# Patient Record
Sex: Male | Born: 1970 | Race: White | Hispanic: No | Marital: Single | Smoking: Current every day smoker
Health system: Southern US, Community
[De-identification: ages and names within clinical notes are randomized; demographics above are authoritative.]

## PROBLEM LIST (undated history)

## (undated) DIAGNOSIS — K7460 Unspecified cirrhosis of liver: Secondary | ICD-10-CM

## (undated) DIAGNOSIS — R188 Other ascites: Secondary | ICD-10-CM

## (undated) DIAGNOSIS — K219 Gastro-esophageal reflux disease without esophagitis: Secondary | ICD-10-CM

## (undated) DIAGNOSIS — D696 Thrombocytopenia, unspecified: Secondary | ICD-10-CM

## (undated) DIAGNOSIS — K7031 Alcoholic cirrhosis of liver with ascites: Secondary | ICD-10-CM

## (undated) DIAGNOSIS — E871 Hypo-osmolality and hyponatremia: Secondary | ICD-10-CM

## (undated) DIAGNOSIS — I2510 Atherosclerotic heart disease of native coronary artery without angina pectoris: Secondary | ICD-10-CM

## (undated) DIAGNOSIS — I10 Essential (primary) hypertension: Secondary | ICD-10-CM

## (undated) DIAGNOSIS — I209 Angina pectoris, unspecified: Secondary | ICD-10-CM

## (undated) DIAGNOSIS — F419 Anxiety disorder, unspecified: Secondary | ICD-10-CM

## (undated) DIAGNOSIS — Z87898 Personal history of other specified conditions: Secondary | ICD-10-CM

## (undated) DIAGNOSIS — K2971 Gastritis, unspecified, with bleeding: Secondary | ICD-10-CM

## (undated) DIAGNOSIS — I8511 Secondary esophageal varices with bleeding: Secondary | ICD-10-CM

## (undated) DIAGNOSIS — I509 Heart failure, unspecified: Secondary | ICD-10-CM

## (undated) DIAGNOSIS — G4730 Sleep apnea, unspecified: Secondary | ICD-10-CM

## (undated) DIAGNOSIS — I480 Paroxysmal atrial fibrillation: Secondary | ICD-10-CM

## (undated) DIAGNOSIS — E785 Hyperlipidemia, unspecified: Secondary | ICD-10-CM

## (undated) DIAGNOSIS — I4891 Unspecified atrial fibrillation: Secondary | ICD-10-CM

## (undated) DIAGNOSIS — K5732 Diverticulitis of large intestine without perforation or abscess without bleeding: Secondary | ICD-10-CM

## (undated) DIAGNOSIS — K3189 Other diseases of stomach and duodenum: Secondary | ICD-10-CM

## (undated) DIAGNOSIS — M5030 Other cervical disc degeneration, unspecified cervical region: Secondary | ICD-10-CM

## (undated) DIAGNOSIS — Z720 Tobacco use: Secondary | ICD-10-CM

## (undated) HISTORY — DX: Tobacco use: Z72.0

## (undated) HISTORY — PX: COLONOSCOPY: SHX174

## (undated) HISTORY — DX: Anxiety disorder, unspecified: F41.9

## (undated) HISTORY — DX: Hyperlipidemia, unspecified: E78.5

## (undated) HISTORY — PX: HERNIA REPAIR: SHX51

## (undated) HISTORY — DX: Personal history of other specified conditions: Z87.898

## (undated) HISTORY — DX: Diverticulitis of large intestine without perforation or abscess without bleeding: K57.32

## (undated) HISTORY — PX: APPENDECTOMY: SHX54

---

## 1999-09-30 ENCOUNTER — Emergency Department (HOSPITAL_COMMUNITY): Admission: EM | Admit: 1999-09-30 | Discharge: 1999-09-30 | Payer: Self-pay | Admitting: Emergency Medicine

## 1999-09-30 ENCOUNTER — Encounter: Payer: Self-pay | Admitting: Emergency Medicine

## 2004-07-11 ENCOUNTER — Ambulatory Visit: Payer: Self-pay | Admitting: Internal Medicine

## 2004-08-29 ENCOUNTER — Ambulatory Visit: Payer: Self-pay | Admitting: Gastroenterology

## 2005-11-12 ENCOUNTER — Ambulatory Visit: Payer: Self-pay | Admitting: Ophthalmology

## 2008-03-10 ENCOUNTER — Emergency Department: Payer: Self-pay | Admitting: Emergency Medicine

## 2008-04-16 ENCOUNTER — Observation Stay: Payer: Self-pay | Admitting: General Surgery

## 2010-07-19 ENCOUNTER — Emergency Department: Payer: Self-pay | Admitting: Emergency Medicine

## 2010-11-27 ENCOUNTER — Emergency Department: Payer: Self-pay | Admitting: Emergency Medicine

## 2010-12-06 ENCOUNTER — Ambulatory Visit: Payer: Self-pay | Admitting: Internal Medicine

## 2011-05-14 ENCOUNTER — Emergency Department: Payer: Self-pay | Admitting: Emergency Medicine

## 2011-05-14 ENCOUNTER — Other Ambulatory Visit: Payer: Self-pay | Admitting: Physical Medicine and Rehabilitation

## 2011-05-14 DIAGNOSIS — M542 Cervicalgia: Secondary | ICD-10-CM

## 2011-05-15 ENCOUNTER — Other Ambulatory Visit: Payer: Self-pay | Admitting: Physical Medicine and Rehabilitation

## 2011-05-15 ENCOUNTER — Ambulatory Visit
Admission: RE | Admit: 2011-05-15 | Discharge: 2011-05-15 | Disposition: A | Discharge status: Home / Self Care | Payer: Self-pay | Source: Ambulatory Visit | Attending: Physical Medicine and Rehabilitation | Admitting: Physical Medicine and Rehabilitation

## 2011-05-15 DIAGNOSIS — R200 Anesthesia of skin: Secondary | ICD-10-CM

## 2011-05-15 DIAGNOSIS — R29898 Other symptoms and signs involving the musculoskeletal system: Secondary | ICD-10-CM

## 2011-06-25 ENCOUNTER — Other Ambulatory Visit: Payer: Self-pay | Admitting: Neurosurgery

## 2011-06-25 DIAGNOSIS — M542 Cervicalgia: Secondary | ICD-10-CM

## 2011-06-26 MED ORDER — DIAZEPAM 2 MG PO TABS: 10.0000 mg | ORAL_TABLET | Freq: Once | ORAL | Status: DC

## 2011-06-26 MED ORDER — DIAZEPAM 2 MG PO TABS
10.0000 mg | ORAL_TABLET | Freq: Once | ORAL | Status: AC
  Administered 2011-06-27: 10 mg via ORAL

## 2011-06-27 ENCOUNTER — Ambulatory Visit
Admission: RE | Admit: 2011-06-27 | Discharge: 2011-06-27 | Disposition: A | Discharge status: Home / Self Care | Payer: No Typology Code available for payment source | Source: Ambulatory Visit | Attending: Neurosurgery | Admitting: Neurosurgery

## 2011-06-27 DIAGNOSIS — M542 Cervicalgia: Secondary | ICD-10-CM

## 2011-06-27 MED ORDER — SODIUM CHLORIDE 0.9 % IV SOLN: 4.0000 mg | Freq: Four times a day (QID) | INTRAVENOUS | Status: DC | PRN

## 2011-06-27 MED ORDER — MEPERIDINE HCL 100 MG/ML IJ SOLN
75.0000 mg | Freq: Once | INTRAMUSCULAR | Status: AC
  Administered 2011-06-27: 75 mg via INTRAMUSCULAR

## 2011-06-27 MED ORDER — ONDANSETRON HCL 4 MG/2ML IJ SOLN
4.0000 mg | Freq: Once | INTRAMUSCULAR | Status: AC
  Administered 2011-06-27: 4 mg via INTRAMUSCULAR

## 2011-06-27 MED ORDER — IOHEXOL 300 MG/ML  SOLN: 10.0000 mL | Freq: Once | INTRAMUSCULAR | Status: AC | PRN

## 2011-06-27 MED ORDER — DIPHENHYDRAMINE HCL 25 MG PO CAPS
50.0000 mg | ORAL_CAPSULE | Freq: Once | ORAL | Status: AC
  Administered 2011-06-27: 50 mg via ORAL

## 2011-06-27 NOTE — Progress Notes (Signed)
Pt c/o iching all over no redness or hives seen. Benadryl 50 mg po given for  Itching, probably from the demeral.

## 2011-07-04 ENCOUNTER — Other Ambulatory Visit: Payer: Self-pay | Admitting: Neurosurgery

## 2011-07-04 DIAGNOSIS — M542 Cervicalgia: Secondary | ICD-10-CM

## 2011-07-10 ENCOUNTER — Other Ambulatory Visit: Payer: Self-pay | Admitting: Neurosurgery

## 2011-07-10 DIAGNOSIS — M542 Cervicalgia: Secondary | ICD-10-CM

## 2011-07-11 ENCOUNTER — Other Ambulatory Visit: Payer: No Typology Code available for payment source

## 2011-07-15 ENCOUNTER — Ambulatory Visit
Admission: RE | Admit: 2011-07-15 | Discharge: 2011-07-15 | Disposition: A | Discharge status: Home / Self Care | Payer: No Typology Code available for payment source | Source: Ambulatory Visit | Attending: Neurosurgery | Admitting: Neurosurgery

## 2011-07-15 DIAGNOSIS — M542 Cervicalgia: Secondary | ICD-10-CM

## 2013-06-17 ENCOUNTER — Ambulatory Visit: Payer: Self-pay | Admitting: Physician Assistant

## 2013-11-02 ENCOUNTER — Emergency Department: Payer: Self-pay | Admitting: Emergency Medicine

## 2013-11-02 LAB — COMPREHENSIVE METABOLIC PANEL
ALK PHOS: 80 U/L
ALT: 30 U/L (ref 12–78)
ANION GAP: 6 — AB (ref 7–16)
Albumin: 4.2 g/dL (ref 3.4–5.0)
BILIRUBIN TOTAL: 0.5 mg/dL (ref 0.2–1.0)
BUN: 12 mg/dL (ref 7–18)
Calcium, Total: 8.9 mg/dL (ref 8.5–10.1)
Chloride: 104 mmol/L (ref 98–107)
Co2: 28 mmol/L (ref 21–32)
Creatinine: 1.15 mg/dL (ref 0.60–1.30)
EGFR (African American): >60
GLUCOSE: 87 mg/dL (ref 65–99)
Osmolality: 275 (ref 275–301)
Potassium: 3.7 mmol/L (ref 3.5–5.1)
SGOT(AST): 26 U/L (ref 15–37)
Sodium: 138 mmol/L (ref 136–145)
TOTAL PROTEIN: 7.7 g/dL (ref 6.4–8.2)

## 2013-11-02 LAB — URINALYSIS, COMPLETE
Bacteria: NONE SEEN
Bilirubin,UR: NEGATIVE
Blood: NEGATIVE
Glucose,UR: NEGATIVE mg/dL (ref 0–75)
Ketone: NEGATIVE
LEUKOCYTE ESTERASE: NEGATIVE
NITRITE: NEGATIVE
PH: 7 (ref 4.5–8.0)
Protein: NEGATIVE
Specific Gravity: 1.02 (ref 1.003–1.030)

## 2013-11-02 LAB — CBC
HCT: 45.3 % (ref 40.0–52.0)
HGB: 15.7 g/dL (ref 13.0–18.0)
MCH: 32.2 pg (ref 26.0–34.0)
MCHC: 34.8 g/dL (ref 32.0–36.0)
MCV: 93 fL (ref 80–100)
Platelet: 283 10*3/uL (ref 150–440)
RBC: 4.89 10*6/uL (ref 4.40–5.90)
RDW: 13.3 % (ref 11.5–14.5)
WBC: 11 10*3/uL — AB (ref 3.8–10.6)

## 2015-02-15 DIAGNOSIS — F411 Generalized anxiety disorder: Secondary | ICD-10-CM | POA: Insufficient documentation

## 2015-08-21 DIAGNOSIS — Z720 Tobacco use: Secondary | ICD-10-CM | POA: Insufficient documentation

## 2015-09-06 ENCOUNTER — Encounter: Payer: Self-pay | Admitting: *Deleted

## 2015-09-07 ENCOUNTER — Encounter: Payer: Self-pay | Admitting: *Deleted

## 2015-09-07 ENCOUNTER — Encounter
Admission: RE | Disposition: A | Discharge status: Home / Self Care | Payer: Self-pay | Source: Ambulatory Visit | Attending: Gastroenterology

## 2015-09-07 ENCOUNTER — Ambulatory Visit
Admission: RE | Admit: 2015-09-07 | Discharge: 2015-09-07 | Disposition: A | Discharge status: Home / Self Care | Payer: BLUE CROSS/BLUE SHIELD | Source: Ambulatory Visit | Attending: Gastroenterology | Admitting: Gastroenterology

## 2015-09-07 ENCOUNTER — Ambulatory Visit: Payer: BLUE CROSS/BLUE SHIELD | Admitting: Anesthesiology

## 2015-09-07 DIAGNOSIS — Z888 Allergy status to other drugs, medicaments and biological substances status: Secondary | ICD-10-CM | POA: Diagnosis not present

## 2015-09-07 DIAGNOSIS — I10 Essential (primary) hypertension: Secondary | ICD-10-CM | POA: Diagnosis not present

## 2015-09-07 DIAGNOSIS — Z79899 Other long term (current) drug therapy: Secondary | ICD-10-CM | POA: Insufficient documentation

## 2015-09-07 DIAGNOSIS — D123 Benign neoplasm of transverse colon: Secondary | ICD-10-CM | POA: Diagnosis not present

## 2015-09-07 DIAGNOSIS — J449 Chronic obstructive pulmonary disease, unspecified: Secondary | ICD-10-CM | POA: Diagnosis not present

## 2015-09-07 DIAGNOSIS — M5030 Other cervical disc degeneration, unspecified cervical region: Secondary | ICD-10-CM | POA: Insufficient documentation

## 2015-09-07 DIAGNOSIS — Z885 Allergy status to narcotic agent status: Secondary | ICD-10-CM | POA: Insufficient documentation

## 2015-09-07 DIAGNOSIS — Z823 Family history of stroke: Secondary | ICD-10-CM | POA: Diagnosis not present

## 2015-09-07 DIAGNOSIS — K625 Hemorrhage of anus and rectum: Secondary | ICD-10-CM | POA: Diagnosis present

## 2015-09-07 DIAGNOSIS — F17210 Nicotine dependence, cigarettes, uncomplicated: Secondary | ICD-10-CM | POA: Insufficient documentation

## 2015-09-07 DIAGNOSIS — Z8489 Family history of other specified conditions: Secondary | ICD-10-CM | POA: Diagnosis not present

## 2015-09-07 DIAGNOSIS — K529 Noninfective gastroenteritis and colitis, unspecified: Secondary | ICD-10-CM | POA: Insufficient documentation

## 2015-09-07 DIAGNOSIS — G4730 Sleep apnea, unspecified: Secondary | ICD-10-CM | POA: Diagnosis not present

## 2015-09-07 HISTORY — DX: Sleep apnea, unspecified: G47.30

## 2015-09-07 HISTORY — DX: Essential (primary) hypertension: I10

## 2015-09-07 HISTORY — PX: COLONOSCOPY WITH PROPOFOL: SHX5780

## 2015-09-07 HISTORY — DX: Other cervical disc degeneration, unspecified cervical region: M50.30

## 2015-09-07 SURGERY — COLONOSCOPY WITH PROPOFOL
Anesthesia: General

## 2015-09-07 MED ORDER — SODIUM CHLORIDE 0.9 % IV SOLN
INTRAVENOUS | Status: DC
  Administered 2015-09-07: 11:00:00 via INTRAVENOUS

## 2015-09-07 MED ORDER — MIDAZOLAM HCL 2 MG/2ML IJ SOLN
INTRAMUSCULAR | Status: DC | PRN
  Administered 2015-09-07: 2 mg via INTRAVENOUS

## 2015-09-07 MED ORDER — PROPOFOL 500 MG/50ML IV EMUL
INTRAVENOUS | Status: DC | PRN
  Administered 2015-09-07: 140 ug/kg/min via INTRAVENOUS

## 2015-09-07 MED ORDER — FENTANYL CITRATE (PF) 100 MCG/2ML IJ SOLN
INTRAMUSCULAR | Status: DC | PRN
  Administered 2015-09-07 (×2): 50 ug via INTRAVENOUS

## 2015-09-07 NOTE — Transfer of Care (Signed)
Immediate Anesthesia Transfer of Care Note  Patient: Omar Turner  Procedure(s) Performed: Procedure(s): COLONOSCOPY WITH PROPOFOL (N/A)  Patient Location: PACU  Anesthesia Type:General  Level of Consciousness: awake, alert , oriented and sedated  Airway & Oxygen Therapy: Patient Spontanous Breathing and Patient connected to nasal cannula oxygen  Post-op Assessment: Report given to RN and Post -op Vital signs reviewed and stable  Post vital signs: Reviewed and stable  Last Vitals:  Filed Vitals:   09/07/15 1038  BP: 140/85  Pulse: 90  Temp: 36.8 C  Resp: 12    Complications: No apparent anesthesia complications

## 2015-09-07 NOTE — H&P (Signed)
  Date of Initial H&P: 08/30/2015  History reviewed, patient examined, no change in status, stable for surgery.

## 2015-09-07 NOTE — Anesthesia Procedure Notes (Signed)
Performed by: COOK-MARTIN, Damian Hofstra Pre-anesthesia Checklist: Patient identified, Emergency Drugs available, Suction available, Patient being monitored and Timeout performed Patient Re-evaluated:Patient Re-evaluated prior to inductionOxygen Delivery Method: Nasal cannula Preoxygenation: Pre-oxygenation with 100% oxygen Intubation Type: IV induction Placement Confirmation: positive ETCO2 and CO2 detector       

## 2015-09-07 NOTE — Anesthesia Postprocedure Evaluation (Signed)
Anesthesia Post Note  Patient: Omar Turner  Procedure(s) Performed: Procedure(s) (LRB): COLONOSCOPY WITH PROPOFOL (N/A)  Patient location during evaluation: PACU Anesthesia Type: General Level of consciousness: awake Pain management: pain level controlled Vital Signs Assessment: post-procedure vital signs reviewed and stable Respiratory status: respiratory function stable Cardiovascular status: stable Anesthetic complications: no    Last Vitals:  Filed Vitals:   09/07/15 1220 09/07/15 1230  BP: 111/77 118/85  Pulse: 92 82  Temp:    Resp: 17 17    Last Pain:  Filed Vitals:   09/07/15 1231  PainSc: 7                  VAN STAVEREN,Hidaya Daniel

## 2015-09-07 NOTE — Op Note (Signed)
[facility] Patient Name: Omar Turner Procedure Date: 09/07/2015 11:45 AM MRN: [id number] Account #: [id number] Date of Birth: 02-Oct-1971 Admit Type: Outpatient Age: 44 Room: [facility] ENDO ROOM 3 Gender: Male Note Status: Finalized Procedure:         Colonoscopy Indications:       Rectal bleeding Providers:         Lupita Dawn. Candace Cruise, MD Referring MD:      Ramonita Lab, MD (Referring MD) Medicines:         Monitored Anesthesia Care Complications:     No immediate complications. Procedure:         Pre-Anesthesia Assessment:                    - Prior to the procedure, a History and Physical was                     performed, and patient medications, allergies and                     sensitivities were reviewed. The patient's tolerance of                     previous anesthesia was reviewed.                    - The risks and benefits of the procedure and the sedation                     options and risks were discussed with the patient. All                     questions were answered and informed consent was obtained.                    - After reviewing the risks and benefits, the patient was                     deemed in satisfactory condition to undergo the procedure.                    After obtaining informed consent, the colonoscope was                     passed under direct vision. Throughout the procedure, the                     patient's blood pressure, pulse, and oxygen saturations                     were monitored continuously. The Colonoscope was                     introduced through the anus and advanced to the the cecum,                     identified by appendiceal orifice and ileocecal valve. The                     colonoscopy was performed without difficulty. The patient                     tolerated the procedure well. The quality of the bowel  preparation was fair. Findings:      A diminutive polyp was found in the  transverse colon. The polyp was       sessile. The polyp was removed with a jumbo cold forceps. Resection and       retrieval were complete.      The exam was otherwise without abnormality. Impression:        - One diminutive polyp in the transverse colon. Resected                     and retrieved.                    - The examination was otherwise normal. Recommendation:    - Discharge patient to home.                    - Await pathology results.                    - Repeat colonoscopy in 5-10 years for surveillance based                     on pathology results.                    - The findings and recommendations were discussed with the                     patient. Procedure Code(s): --- Professional ---                    [id number], Colonoscopy, flexible; with biopsy, single or                     multiple Diagnosis Code(s): --- Professional ---                    D12.3, Benign neoplasm of transverse colon                    K62.5, Hemorrhage of anus and rectum CPT copyright 2014 American Medical Association. All rights reserved. The codes documented in this report are preliminary and upon coder review may  be revised to meet current compliance requirements. Hulen Luster, MD 09/07/2015 12:00:44 PM This report has been signed electronically. Number of Addenda: 0 Note Initiated On: 09/07/2015 11:45 AM Scope Withdrawal Time: 0 hours 6 minutes 1 second  Total Procedure Duration: 0 hours 9 minutes 10 seconds       [facility]

## 2015-09-07 NOTE — Anesthesia Preprocedure Evaluation (Signed)
Anesthesia Evaluation  Patient identified by MRN, date of birth, ID band Patient awake    Reviewed: Allergy & Precautions, NPO status , Patient's Chart, lab work & pertinent test results  Airway Mallampati: II       Dental  (+) Chipped, Teeth Intact   Pulmonary sleep apnea , COPD, Current Smoker,    + rhonchi        Cardiovascular hypertension, Pt. on medications  Rhythm:Regular Rate:Normal     Neuro/Psych    GI/Hepatic negative GI ROS, Neg liver ROS,   Endo/Other  negative endocrine ROS  Renal/GU negative Renal ROS     Musculoskeletal   Abdominal Normal abdominal exam  (+)   Peds  Hematology negative hematology ROS (+)   Anesthesia Other Findings   Reproductive/Obstetrics                             Anesthesia Physical Anesthesia Plan  ASA: II  Anesthesia Plan: General   Post-op Pain Management:    Induction: Intravenous  Airway Management Planned: Nasal Cannula  Additional Equipment:   Intra-op Plan:   Post-operative Plan:   Informed Consent: I have reviewed the patients History and Physical, chart, labs and discussed the procedure including the risks, benefits and alternatives for the proposed anesthesia with the patient or authorized representative who has indicated his/her understanding and acceptance.     Plan Discussed with: CRNA  Anesthesia Plan Comments:         Anesthesia Quick Evaluation

## 2015-09-08 LAB — SURGICAL PATHOLOGY

## 2015-09-11 ENCOUNTER — Encounter: Payer: Self-pay | Admitting: Gastroenterology

## 2016-06-07 ENCOUNTER — Emergency Department
Admission: EM | Admit: 2016-06-07 | Discharge: 2016-06-07 | Disposition: A | Discharge status: Home / Self Care | Payer: BLUE CROSS/BLUE SHIELD | Attending: Emergency Medicine | Admitting: Emergency Medicine

## 2016-06-07 ENCOUNTER — Emergency Department: Payer: BLUE CROSS/BLUE SHIELD

## 2016-06-07 ENCOUNTER — Encounter: Payer: Self-pay | Admitting: Emergency Medicine

## 2016-06-07 DIAGNOSIS — F17200 Nicotine dependence, unspecified, uncomplicated: Secondary | ICD-10-CM | POA: Diagnosis not present

## 2016-06-07 DIAGNOSIS — R079 Chest pain, unspecified: Secondary | ICD-10-CM

## 2016-06-07 DIAGNOSIS — R0602 Shortness of breath: Secondary | ICD-10-CM | POA: Diagnosis not present

## 2016-06-07 DIAGNOSIS — R0789 Other chest pain: Secondary | ICD-10-CM | POA: Insufficient documentation

## 2016-06-07 DIAGNOSIS — Z79899 Other long term (current) drug therapy: Secondary | ICD-10-CM | POA: Insufficient documentation

## 2016-06-07 DIAGNOSIS — Z791 Long term (current) use of non-steroidal anti-inflammatories (NSAID): Secondary | ICD-10-CM | POA: Diagnosis not present

## 2016-06-07 DIAGNOSIS — R000 Tachycardia, unspecified: Secondary | ICD-10-CM | POA: Diagnosis not present

## 2016-06-07 DIAGNOSIS — I10 Essential (primary) hypertension: Secondary | ICD-10-CM | POA: Diagnosis not present

## 2016-06-07 LAB — CBC WITH DIFFERENTIAL/PLATELET
Basophils Absolute: 0.1 10*3/uL (ref 0–0.1)
EOS ABS: 0.6 10*3/uL (ref 0–0.7)
Eosinophils Relative: 5 %
HCT: 47.1 % (ref 40.0–52.0)
HEMOGLOBIN: 16.5 g/dL (ref 13.0–18.0)
LYMPHS ABS: 4.9 10*3/uL — AB (ref 1.0–3.6)
Lymphocytes Relative: 40 %
MCH: 31.9 pg (ref 26.0–34.0)
MCHC: 34.9 g/dL (ref 32.0–36.0)
MCV: 91.3 fL (ref 80.0–100.0)
Monocytes Absolute: 0.8 10*3/uL (ref 0.2–1.0)
Monocytes Relative: 6 %
NEUTROS ABS: 5.7 10*3/uL (ref 1.4–6.5)
Platelets: 284 10*3/uL (ref 150–440)
RBC: 5.16 MIL/uL (ref 4.40–5.90)
RDW: 13.6 % (ref 11.5–14.5)
WBC: 12.2 10*3/uL — AB (ref 3.8–10.6)

## 2016-06-07 LAB — BASIC METABOLIC PANEL
ANION GAP: 10 (ref 5–15)
BUN: 5 mg/dL — ABNORMAL LOW (ref 6–20)
CO2: 21 mmol/L — ABNORMAL LOW (ref 22–32)
Calcium: 9.3 mg/dL (ref 8.9–10.3)
Chloride: 107 mmol/L (ref 101–111)
Creatinine, Ser: 0.71 mg/dL (ref 0.61–1.24)
GFR calc Af Amer: >60 mL/min (ref >60)
Glucose, Bld: 86 mg/dL (ref 65–99)
POTASSIUM: 3.6 mmol/L (ref 3.5–5.1)
SODIUM: 138 mmol/L (ref 135–145)

## 2016-06-07 LAB — TROPONIN I

## 2016-06-07 MED ORDER — IPRATROPIUM-ALBUTEROL 0.5-2.5 (3) MG/3ML IN SOLN
3.0000 mL | Freq: Once | RESPIRATORY_TRACT | Status: AC
  Administered 2016-06-07: 3 mL via RESPIRATORY_TRACT
  Filled 2016-06-07: qty 3

## 2016-06-07 MED ORDER — LORAZEPAM 2 MG/ML IJ SOLN
1.0000 mg | Freq: Once | INTRAMUSCULAR | Status: AC
  Administered 2016-06-07: 1 mg via INTRAVENOUS

## 2016-06-07 MED ORDER — MORPHINE SULFATE (PF) 2 MG/ML IV SOLN: 2.0000 mg | Freq: Once | INTRAVENOUS | Status: DC

## 2016-06-07 MED ORDER — IOPAMIDOL (ISOVUE-370) INJECTION 76%
75.0000 mL | Freq: Once | INTRAVENOUS | Status: AC | PRN
  Administered 2016-06-07: 75 mL via INTRAVENOUS

## 2016-06-07 MED ORDER — NITROGLYCERIN 0.4 MG SL SUBL: 0.4000 mg | SUBLINGUAL_TABLET | Freq: Once | SUBLINGUAL | Status: DC

## 2016-06-07 MED ORDER — LORAZEPAM 2 MG/ML IJ SOLN
INTRAMUSCULAR | Status: AC
  Administered 2016-06-07: 1 mg via INTRAVENOUS
  Filled 2016-06-07: qty 1

## 2016-06-07 MED ORDER — NITROGLYCERIN 0.4 MG SL SUBL
SUBLINGUAL_TABLET | SUBLINGUAL | Status: AC
  Administered 2016-06-07: 14:00:00
  Filled 2016-06-07: qty 1

## 2016-06-07 MED ORDER — ASPIRIN 81 MG PO CHEW: 324.0000 mg | CHEWABLE_TABLET | Freq: Once | ORAL | Status: DC

## 2016-06-07 MED ORDER — MORPHINE SULFATE (PF) 2 MG/ML IV SOLN
INTRAVENOUS | Status: AC
  Administered 2016-06-07: 2 mg via INTRAVENOUS
  Filled 2016-06-07: qty 1

## 2016-06-07 MED ORDER — ASPIRIN 81 MG PO CHEW
CHEWABLE_TABLET | ORAL | Status: AC
  Administered 2016-06-07: 324 mg
  Filled 2016-06-07: qty 4

## 2016-06-07 NOTE — Progress Notes (Signed)
Pt. Transported on the BIPAP to CT and back to ED.

## 2016-06-07 NOTE — ED Triage Notes (Signed)
Patient brought in by [facility] from work for sudden onset shortness of breath. Patient arrived to ED on non-rebreather in obvious distress.

## 2016-06-07 NOTE — ED Provider Notes (Signed)
[facility] Emergency Department Provider Note   ____________________________________________   I have reviewed the triage vital signs and the nursing notes.   HISTORY  Chief Complaint Respiratory Distress   History limited by: Not Limited   HPI Omar Turner is a 45 y.o. male who presents to the emergency department today via EMS because of concerns for respiratory distress. The patient was at work. He works at a [employer]. He states he was sitting down and all of a sudden he felt tightness in his chest and shortness of breath. The symptoms persist. He describes the tightness as being located in the central chest. He denies similar symptoms in the past. Denies any history of lung or heart disease. He is a smoker. Denies any recent travel.   Past Medical History:  Diagnosis Date  . Degenerative disc disease, cervical   . Hypertension   . Sleep apnea     There are no active problems to display for this patient.   Past Surgical History:  Procedure Laterality Date  . COLONOSCOPY    . COLONOSCOPY WITH PROPOFOL N/A 09/07/2015   Procedure: COLONOSCOPY WITH PROPOFOL;  Surgeon: Hulen Luster, MD;  Location: [facility] ENDOSCOPY;  Service: Gastroenterology;  Laterality: N/A;  . HERNIA REPAIR      Prior to Admission medications   Medication Sig Start Date End Date Taking? Authorizing Provider  amLODipine (NORVASC) 5 MG tablet Take 5 mg by mouth daily.    Historical Provider, MD  buPROPion (WELLBUTRIN SR) 150 MG 12 hr tablet Take 150 mg by mouth 2 (two) times daily.    Historical Provider, MD  hydrocortisone (ANUSOL-HC) 25 MG suppository Place 25 mg rectally 2 (two) times daily.    Historical Provider, MD  pantoprazole (PROTONIX) 40 MG tablet Take 40 mg by mouth daily.    Historical Provider, MD  predniSONE (DELTASONE) 10 MG tablet Take 10 mg by mouth daily with breakfast.    Historical Provider, MD    Allergies Chantix [varenicline]; Hydrocodone-homatropine;  and Lexapro [escitalopram]  No family history on file.  Social History Social History  Substance Use Topics  . Smoking status: Current Every Day Smoker  . Smokeless tobacco: Never Used  . Alcohol use 1.2 oz/week    2 Shots of liquor per week    Review of Systems  Constitutional: Negative for fever. Cardiovascular: Positive for chest tightness Respiratory: Positive for shortness of breath. Gastrointestinal: Negative for abdominal pain, vomiting and diarrhea. Genitourinary: Negative for dysuria. Musculoskeletal: Negative for back pain. Skin: Negative for rash. Neurological: Negative for headaches, focal weakness or numbness.   10-point ROS otherwise negative.  ____________________________________________   PHYSICAL EXAM:  VITAL SIGNS: ED Triage Vitals [06/07/16 1311]  Enc Vitals Group     BP      Pulse Rate (!) 133     Resp (!) 22     Temp      Temp src      SpO2 100 %     Weight    Constitutional: Alert and oriented. Significant respiratory distress. Eyes: Conjunctivae are normal. Normal extraocular movements. ENT   Head: Normocephalic and atraumatic.   Nose: No congestion/rhinnorhea.   Mouth/Throat: Mucous membranes are moist.   Neck: No stridor. Hematological/Lymphatic/Immunilogical: No cervical lymphadenopathy. Cardiovascular: Tachycardic, regular rhythm.  No murmurs, rubs, or gallops. Respiratory: Significant restaurant distress. Tachypnea. Increased respiratory effort. No wheezing or crackles or rhonchi appreciated. Good breath sounds bilaterally. Gastrointestinal: Soft and nontender. No distention.  Genitourinary: Deferred Musculoskeletal: Normal range  of motion in all extremities. No lower extremity edema. Neurologic:  Normal speech and language. No gross focal neurologic deficits are appreciated.  Skin:  Skin is warm, dry and intact. No rash noted. Psychiatric: Mood and affect are normal. Speech and behavior are normal. Patient exhibits  appropriate insight and judgment.  ____________________________________________    LABS (pertinent positives/negatives)  Labs Reviewed  CBC WITH DIFFERENTIAL/PLATELET - Abnormal; Notable for the following:       Result Value   WBC 12.2 (*)    Lymphs Abs 4.9 (*)    All other components within normal limits  BASIC METABOLIC PANEL - Abnormal; Notable for the following:    CO2 21 (*)    BUN 5 (*)    All other components within normal limits  TROPONIN I  TROPONIN I    2nd trop pending ____________________________________________   EKG  I, Nance Pear, attending physician, personally viewed and interpreted this EKG  EKG Time: 1312 Rate: 127 Rhythm: sinus tachycardia Axis: normal Intervals: qtc 447 QRS: narrow ST changes: no st elevation Impression: abnormal ekg  I, Nance Pear, attending physician, personally viewed and interpreted this EKG  EKG Time: 1426 Rate: 114 Rhythm: sinus tachycardia Axis: normal Intervals: qtc 458 QRS: narrow ST changes: no st elevation Impression: abnormal ekg   ____________________________________________    RADIOLOGY  CXR   IMPRESSION:  No active disease.   CT angio IMPRESSION:  Negative. No evidence pulmonary embolism or other active disease  within the thorax.     ____________________________________________   PROCEDURES  .Critical Care Performed by: Nance Pear Authorized by: Nance Pear   Critical care provider statement:    Critical care time (minutes):  35   Critical care time was exclusive of:  Separately billable procedures and treating other patients   Critical care was necessary to treat or prevent imminent or life-threatening deterioration of the following conditions:  Respiratory failure   Critical care was time spent personally by me on the following activities:  Development of treatment plan with patient or surrogate, evaluation of patient's response to treatment, examination of  patient, obtaining history from patient or surrogate, ordering and performing treatments and interventions, ordering and review of laboratory studies, ordering and review of radiographic studies, pulse oximetry and re-evaluation of patient's condition    ____________________________________________   INITIAL IMPRESSION / ASSESSMENT AND PLAN / ED COURSE  Pertinent labs & imaging results that were available during my care of the patient were reviewed by me and considered in my medical decision making (see chart for details).  Patient presented to the emergency department today because of concerns for acute respiratory distress and chest tightness. On initial exam patient did appear to be in quite some distress. He was placed on BiPAP. No wheezing or rhonchi appreciated. Patient was tachycardic without any concerning findings on EKG. Will obtain CT angiogram to evaluate for pulmonary embolism, dissection.  Clinical Course   CT angiogram negative. Patient did improve on BiPAP significant leg. Patient also stated he felt better after DuoNeb treatment. Initial blood work negative. Will repeat troponin. Repeat EKG again without any concerning findings. ____________________________________________   FINAL CLINICAL IMPRESSION(S) / ED DIAGNOSES  Chest pain Shortness of breath  Note: This dictation was prepared with Dragon dictation. Any transcriptional errors that result from this process are unintentional    Nance Pear, MD 06/07/16 [phone]

## 2016-06-07 NOTE — Discharge Instructions (Signed)
You have been seen in the emergency department today for chest pain. Your workup has shown normal results. As we discussed please follow-up with your primary care physician in the next 1-2 days for recheck. Return to the emergency department for any further chest pain, trouble breathing, or any other symptom personally concerning to yourself.  Cardiology should be calling you to arrange a follow-up appointment with stress test, but if you do not hear from them by Monday at noon please call them to confirm at the number provided.

## 2016-06-07 NOTE — ED Provider Notes (Signed)
-----------------------------------------   5:51 PM on 06/07/2016 -----------------------------------------  Patient care assumed from Dr. Archie Balboa. Overall the patient has had a largely reassuring workup with normal EKG, normal labs including 2 sets of troponin, a normal CT angiography of the chest. I have personally seen and evaluated the patient myself, the patient appears well, no distress, denies any chest pain or trouble breathing currently. He is speaking calmly and clearly. Vitals are largely within normal limits current heart rate of 85. Given the patient's initial presentation I discussed the patient with Dr. Acie Fredrickson of cardiology, who will get the patient in with cardiology this coming week for a stress test. I discussed this with the patient, he is agreeable as he is symptom-free besides feeling generalized fatigue. Cardiology recommended starting the patient on 81 mg aspirin daily until he can complete his stress test. I discussed strict return precautions with the patient. Patient is agreeable.   Harvest Dark, MD 06/07/16 [phone]

## 2016-06-11 ENCOUNTER — Telehealth: Payer: Self-pay | Admitting: Cardiovascular Disease

## 2016-06-11 ENCOUNTER — Other Ambulatory Visit: Payer: Self-pay

## 2016-06-11 DIAGNOSIS — R079 Chest pain, unspecified: Secondary | ICD-10-CM

## 2016-06-11 DIAGNOSIS — R0602 Shortness of breath: Secondary | ICD-10-CM

## 2016-06-11 NOTE — Telephone Encounter (Signed)
Omar Turner was seen in the ER on Friday with sudden shortness of breath and chest pai  Symptoms resoved. 2 troponins were negative  Please call and arrange for a stress myoview for early next week.   Thanks   [facility]   S/w pt who states he has an appt tomorrow w/Dr. Yvone Neu. He is agreeable w/myoview and will further discuss and schedule at tomorrow's OV.

## 2016-06-11 NOTE — Progress Notes (Signed)
Cardiology Office Note   Date:  06/12/2016   ID:  Omar Turner, DOB [birth year], MRN [id number]  Referring Doctor:  Adin Hector, MD   Cardiologist:   Wende Bushy, MD   Reason for consultation:  Chief Complaint  Patient presents with  . Other    Follow up from [facility] ER; chest pain and shortness of breath. Meds reviewed by the patient verbally. Pt. still c/o shortness of breath, chest pain and palpitations.       History of Present Illness: Omar Turner is a 45 y.o. male who presents for Evaluation of chest pain and shortness of breath. Symptoms started last September 1, Friday. After lunch at work, he had sudden onset of chest pain, this was described as severe tightness in the center of the chest, radiating down the left arm. It lasted for a few minutes before they decided to call 911. This was associated with pallor, sweating, and shortness of breath. He was in the ER for more than 2 hours. He received different medications including nitroglycerin, morphine, etc. Eventually, the chest pain resolved. He was ruled out for acute coronary syndrome and was advised to go for outpatient follow-up with cardiology.   He has not had any more chest pains. But he continues to have shortness of breath especially with exertion and have easy fatigability. He oh patient also has palpitations. No lightheadedness or dizziness. No abdominal pain. He uses NSAIDs for chronic joint pains.  ROS:  Please see the history of present illness. Aside from mentioned under HPI, all other systems are reviewed and negative.     Past Medical History:  Diagnosis Date  . Degenerative disc disease, cervical   . Hypertension   . Sleep apnea     Past Surgical History:  Procedure Laterality Date  . COLONOSCOPY    . COLONOSCOPY WITH PROPOFOL N/A 09/07/2015   Procedure: COLONOSCOPY WITH PROPOFOL;  Surgeon: Hulen Luster, MD;  Location: [facility] ENDOSCOPY;  Service: Gastroenterology;  Laterality: N/A;  . HERNIA  REPAIR       reports that he has been smoking.  He has a 15.00 pack-year smoking history. He has never used smokeless tobacco. He reports that he drinks about 1.2 oz of alcohol per week . He reports that he does not use drugs. He continues to smoke one to one and a half packs per day.  family history includes Heart attack in his father; Heart disease in his father; Hypertension in his father. Significant family history of premature CAD in father, and maternal grandmother. There is also diabetes in mother's side. There is history of stroke in paternal side.  Outpatient Medications Prior to Visit  Medication Sig Dispense Refill  . amLODipine (NORVASC) 5 MG tablet Take 5 mg by mouth daily.    . pantoprazole (PROTONIX) 40 MG tablet Take 40 mg by mouth daily.    [name] buPROPion (WELLBUTRIN SR) 150 MG 12 hr tablet Take 150 mg by mouth 2 (two) times daily.    . hydrocortisone (ANUSOL-HC) 25 MG suppository Place 25 mg rectally 2 (two) times daily.    . predniSONE (DELTASONE) 10 MG tablet Take 10 mg by mouth daily with breakfast.     No facility-administered medications prior to visit.      Allergies: Chantix [varenicline]; Hydrocodone-homatropine; and Lexapro [escitalopram]    PHYSICAL EXAM: VS:  BP (!) 140/98 (BP Location: Right Arm, Patient Position: Sitting, Cuff Size: Normal)   Pulse 88   Ht 5\' 11"  (  1.803 m)   Wt 221 lb 12 oz (100.6 kg)   BMI 30.93 kg/m  , Body mass index is 30.93 kg/m. Wt Readings from Last 3 Encounters:  06/12/16 221 lb 12 oz (100.6 kg)  09/07/15 220 lb (99.8 kg)    GENERAL:  well developed, well nourished, obese, not in acute distress HEENT: normocephalic, pink conjunctivae, anicteric sclerae, no xanthelasma, normal dentition, oropharynx clear NECK:  no neck vein engorgement, JVP normal, no hepatojugular reflux, carotid upstroke brisk and symmetric, no bruit, no thyromegaly, no lymphadenopathy LUNGS:  good respiratory effort, clear to auscultation bilaterally CV:  PMI  not displaced, no thrills, no lifts, S1 and S2 within normal limits, no palpable S3 or S4, no murmurs, no rubs, no gallops ABD:  Soft, nontender, nondistended, normoactive bowel sounds, no abdominal aortic bruit, no hepatomegaly, no splenomegaly MS: nontender back, no kyphosis, no scoliosis, no joint deformities EXT:  2+ DP/PT pulses, no edema, no varicosities, no cyanosis, no clubbing SKIN: warm, nondiaphoretic, normal turgor, no ulcers NEUROPSYCH: alert, oriented to person, place, and time, sensory/motor grossly intact, normal mood, appropriate affect  Recent Labs: 06/07/2016: BUN 5; Creatinine, Ser 0.71; Hemoglobin 16.5; Platelets 284; Potassium 3.6; Sodium 138   Lipid Panel No results found for: CHOL, TRIG, HDL, CHOLHDL, VLDL, LDLCALC, LDLDIRECT  From care everywhere 08/21/2015: Total cholesterol 252 Triglyceride 327 LDL 146 neck and HDL 40.6  Other studies Reviewed:  EKG:  The ekg from 06/12/2016 was personally reviewed by me and it revealed sinus rhythm, 88 BPM.  Additional studies/ records that were reviewed personally reviewed by me today include: None available   ASSESSMENT AND PLAN:  Chest pain Shortness of breath Symptoms concerning for true angina  Patient has multiple risk factors for CAD including gender, hypertension, untreated hyperlipidemia, significant smoking history, and significant family history of premature CAD Discussed with patient recommendation of proceeding with left heart catheterization. His pretest probability for CAD is high enough that warrants a more definitive evaluation. We had a detailed discussion of the nature of left heart catheterization, benefits, and risks, including but not limited to less than 1% chance of major problems including MI, CVA, death, bleeding, etc. Mother also present in the room during this discussion. Patient verbalized understanding would like to proceed. He preferred to have the procedure done by Dr. Fletcher Anon since Dr. Fletcher Anon  did this procedure on his stepfather. We will set him up for this procedure. Patient prescribed ASA 81mg  po qd, and NTG SL prn for chest pain. Patient instructed to call 911 for unrelenting chest pain.  Recommend fasting lipid panel for baseline, but start atorvastatin 40 mg by mouth daily at bedtime.  Hypertension Recommend to monitor blood pressure at home. Patient to call our office his persistently greater than 140/80. We can either go up on the dose of amlodipine or start beta blocker.  Hyperlipidemia As discussed above, check lipid panel and start statin therapy.  Tobacco use We discussed the importance of smoking cessation and different strategies for quitting.    Current medicines are reviewed at length with the patient today.  The patient does not have concerns regarding medicines.  Labs/ tests ordered today include:  Orders Placed This Encounter  Procedures  . Lipid Profile  . CBC with Differential/Platelet  . Basic Metabolic Panel (BMET)  . INR/PT  . EKG 12-Lead    I had a lengthy and detailed discussion with the patient regarding diagnoses, prognosis, diagnostic options, treatment options , and side effects of medications.   I  counseled the patient on importance of lifestyle modification including heart healthy diet, regular physical activity (once LHC is done), and smoking cessation.   Disposition:   FU with undersigned after tests   I spent at least 80 minutes with the patient today and more than 50% of the time was spent counseling the patient and coordinating care.     Signed, Wende Bushy, MD  06/12/2016 11:39 AM    [place]  This note was generated in part with voice recognition software and I apologize for any typographical errors that were not detected and corrected.

## 2016-06-12 ENCOUNTER — Encounter: Payer: Self-pay | Admitting: Cardiology

## 2016-06-12 ENCOUNTER — Ambulatory Visit (INDEPENDENT_AMBULATORY_CARE_PROVIDER_SITE_OTHER): Payer: BLUE CROSS/BLUE SHIELD | Admitting: Cardiology

## 2016-06-12 VITALS — BP 140/98 | HR 88 | Ht 71.0 in | Wt 221.8 lb

## 2016-06-12 DIAGNOSIS — I159 Secondary hypertension, unspecified: Secondary | ICD-10-CM | POA: Diagnosis not present

## 2016-06-12 DIAGNOSIS — R079 Chest pain, unspecified: Secondary | ICD-10-CM

## 2016-06-12 DIAGNOSIS — I10 Essential (primary) hypertension: Secondary | ICD-10-CM | POA: Diagnosis not present

## 2016-06-12 DIAGNOSIS — R0602 Shortness of breath: Secondary | ICD-10-CM

## 2016-06-12 DIAGNOSIS — E785 Hyperlipidemia, unspecified: Secondary | ICD-10-CM

## 2016-06-12 DIAGNOSIS — F17200 Nicotine dependence, unspecified, uncomplicated: Secondary | ICD-10-CM

## 2016-06-12 MED ORDER — ASPIRIN EC 81 MG PO TBEC: 81.0000 mg | DELAYED_RELEASE_TABLET | Freq: Every day | ORAL | 3 refills | Status: DC

## 2016-06-12 MED ORDER — NITROGLYCERIN 0.4 MG SL SUBL: 0.4000 mg | SUBLINGUAL_TABLET | SUBLINGUAL | 6 refills | Status: DC | PRN

## 2016-06-12 MED ORDER — ATORVASTATIN CALCIUM 40 MG PO TABS: 40.0000 mg | ORAL_TABLET | Freq: Every day | ORAL | 6 refills | Status: DC

## 2016-06-12 NOTE — Patient Instructions (Addendum)
Medication Instructions:  Your physician has recommended you make the following change in your medication:  1. Start Aspirin 81 mg Once Daily 2. Start Lipitor 40 mg Once Daily 3. Nitroglycerin 0.4 mg under the tongue for chest pain. You can take one tablet every 5 minutes up to a total of 3 doses. Please read information below before taking first dose.    Labwork: Labs done today Lipid panel, CBC, BMP, PT/INR  Testing/Procedures: [facility] Cardiac Cath Instructions   You are scheduled for a Cardiac Cath on:_Monday June 17, 2016 at 09:30AM__  Please arrive at 08:30 AM on the day of your procedure  Please expect a call from our [place] to pre-register you  Do not eat/drink anything after midnight  Someone will need to drive you home  It is recommended someone be with you for the first 24 hours after your procedure  Wear clothes that are easy to get on/off and wear slip on shoes if possible     Day of your procedure: Arrive at the [place] entrance.  Free valet service is available.  After entering the [place] please check-in at the registration desk (1st desk on your right) to receive your armband. After receiving your armband someone will escort you to the cardiac cath/special procedures waiting area.  The usual length of stay after your procedure is about 2 to 3 hours.  This can vary.  If you have any questions, please call our office at [phone], or you may call the cardiac cath lab at [facility] directly at [phone]   Follow-Up: Your physician recommends that you schedule a follow-up appointment in: 3 weeks with Dr. Yvone Neu.  It was a pleasure seeing you today here in the office. Please do not hesitate to give [place] a call back if you have any further questions. [place], BSN      Any Other Special Instructions Will Be Listed Below (If Applicable). Your physician has requested that you regularly monitor and record  your blood pressure readings at home. Please use the same machine at the same time of day to check your readings and record them to bring to your follow-up visit.  Please call if blood pressures are consistently above 140/80. Bring log of blood pressures to next visit.   Carotid Angioplasty With Stent Carotid angioplasty is a procedure to widen or open a narrowed artery in the neck (carotid artery) using a small, metal, mesh tube (stent). The stent acts as a splint inside your artery to provide support. The carotid arteries supply blood to the brain and can become blocked by cholesterol buildup (plaque). This buildup decreases blood flow to the brain. The stent remains in place to keep the carotid artery open.  LET [facility] CARE PROVIDER KNOW ABOUT:  Any allergies you have.  All medicines you are taking, including vitamins, herbs, eye drops, creams, and over-the-counter medicines.  Previous problems you or members of your family have had with the use of anesthetics.  Any blood disorders you have.  Previous surgeries you have had.  Medical conditions you have had, such as heart, kidney, or respiratory conditions, and diabetes.  Possibility of pregnancy, if this applies. RISKS AND COMPLICATIONS Generally, this is a safe procedure. However, as with any procedure, problems can occur. Possible problems include:  Stroke.  Infection.  Bleeding at the incision site.  A reaction to the contrast dye, such as rash, hives, or difficulty breathing.  The stented carotid artery can  become blocked again.  If you take metformin, you may have to temporarily stop this medicine if you are undergoing a procedure that uses an iodine-based contrast dye. Metformin is cleared from the body by the kidneys. Contrast dye can temporarily affect kidney function. In rare cases, it can permanently affect kidney function. Be sure to talk to your health care provider before this procedure if you take  metformin. BEFORE THE PROCEDURE  Ask your health care provider about changing or stopping your regular medicines. This is especially important if you are taking diabetes medicines or blood thinners.  Blood work will be done before your procedure.  Do not eat or drink anything after midnight on the night before the surgery or as told by your health care provider. Ask your health care provider if it is okay to take any needed medicines with a sip of water.  Do not smoke if you smoke. Smoking will increase the chance of a healing problem after surgery.  If you are allowed to go home after the procedure, plan to have someone take you home and stay with you for 24 hours. Do not  drive yourself home. PROCEDURE  Carotid angiography with stent placement is an X-ray imaging procedure done in a catheterization laboratory.   A numbing medicine (local anesthetic) will be used to numb the groin or arm area where a thin, flexible hollow tube (catheter) will be inserted.  A small incision will be made in the groin or arm where the numbing medicine is given. The catheter will be put into an artery where the incision is made. The catheter will then be guided to the carotid arteries with the help of an X-ray machine (fluoroscope).  When the catheter is in the carotid artery, contrast dye will be injected through the catheter. The dye allows the inside of the carotid artery to show up on X-ray.  Once the narrowed portion of the artery is located, the stent can be placed. The stent will be placed beyond the area of narrowing in the carotid artery using a guidewire with a filter. The filter, also known as a distal protection device, is used to capture plaque that is released during the procedure. It helps to prevent plaque from going to the brain and causing a stroke. A small balloon will be inflated for a few seconds to dilate the artery. The stent will then be placed in the artery. A second balloon inflation will  be done to make sure the stent has completely expanded in the carotid artery. The stent will stay in place permanently. After several weeks, the tissue within the artery will heal and grow around the stent.  After the stent is in place, the catheter will be removed. Bleeding from the incision site will be controlled by direct pressure. This can be either by a technician holding manual pressure to the area with his or her hand or by a closure device tool. AFTER THE PROCEDURE  You will be on bed rest for a few hours after surgery. You will need to remain flat during this time. The arm or leg that was used for the incision site will need to remain still. Do not bend, flex, or move the arm or leg because this can cause bleeding at the incision site.  The incision site will be watched and checked frequently for bleeding or swelling.  You may need stay in the hospital overnight for observation.  If there are no problems after the procedure, you may  be allowed to go home the same day. Someone needs to take you home and stay with you for 24 hours after the procedure.  You will be placed on antiplatelet medicine after this procedure. Be sure you understand the antiplatelet dose and how long you will need to take this medicine.  Do not stop taking this medicine without talking to your health care provider. Suddenly stopping antiplatelet medicine puts you at risk for developing a clot.   This information is not intended to replace advice given to you by your health care provider. Make sure you discuss any questions you have with your health care provider.   Document Released: 02/04/2005 Document Revised: 10/14/2014 Document Reviewed: 02/14/2012 Elsevier Interactive Patient Education 2016 Elsevier Inc.     Nitroglycerin sublingual tablets What is this medicine? NITROGLYCERIN (nye troe GLI ser in) is a type of vasodilator. It relaxes blood vessels, increasing the blood and oxygen supply to your heart.  This medicine is used to relieve chest pain caused by angina. It is also used to prevent chest pain before activities like climbing stairs, going outdoors in cold weather, or sexual activity. This medicine may be used for other purposes; ask your health care provider or pharmacist if you have questions. What should I tell my health care provider before I take this medicine? They need to know if you have any of these conditions: -anemia -head injury, recent stroke, or bleeding in the brain -liver disease -previous heart attack -an unusual or allergic reaction to nitroglycerin, other medicines, foods, dyes, or preservatives -pregnant or trying to get pregnant -breast-feeding How should I use this medicine? Take this medicine by mouth as needed. At the first sign of an angina attack (chest pain or tightness) place one tablet under your tongue. You can also take this medicine 5 to 10 minutes before an event likely to produce chest pain. Follow the directions on the prescription label. Let the tablet dissolve under the tongue. Do not swallow whole. Replace the dose if you accidentally swallow it. It will help if your mouth is not dry. Saliva around the tablet will help it to dissolve more quickly. Do not eat or drink, smoke or chew tobacco while a tablet is dissolving. If you are not better within 5 minutes after taking ONE dose of nitroglycerin, call 9-1-1 immediately to seek emergency medical care. Do not take more than 3 nitroglycerin tablets over 15 minutes. If you take this medicine often to relieve symptoms of angina, your doctor or health care professional may provide you with different instructions to manage your symptoms. If symptoms do not go away after following these instructions, it is important to call 9-1-1 immediately. Do not take more than 3 nitroglycerin tablets over 15 minutes. Talk to your pediatrician regarding the use of this medicine in children. Special care may be  needed. Overdosage: If you think you have taken too much of this medicine contact a poison control center or emergency room at once. NOTE: This medicine is only for you. Do not share this medicine with others. What if I miss a dose? This does not apply. This medicine is only used as needed. What may interact with this medicine? Do not take this medicine with any of the following medications: -certain migraine medicines like ergotamine and dihydroergotamine (DHE) -medicines used to treat erectile dysfunction like sildenafil, tadalafil, and vardenafil -riociguat This medicine may also interact with the following medications: -alteplase -aspirin -heparin -medicines for high blood pressure -medicines for mental depression -other  medicines used to treat angina -phenothiazines like chlorpromazine, mesoridazine, prochlorperazine, thioridazine This list may not describe all possible interactions. Give your health care provider a list of all the medicines, herbs, non-prescription drugs, or dietary supplements you use. Also tell them if you smoke, drink alcohol, or use illegal drugs. Some items may interact with your medicine. What should I watch for while using this medicine? Tell your doctor or health care professional if you feel your medicine is no longer working. Keep this medicine with you at all times. Sit or lie down when you take your medicine to prevent falling if you feel dizzy or faint after using it. Try to remain calm. This will help you to feel better faster. If you feel dizzy, take several deep breaths and lie down with your feet propped up, or bend forward with your head resting between your knees. You may get drowsy or dizzy. Do not drive, use machinery, or do anything that needs mental alertness until you know how this drug affects you. Do not stand or sit up quickly, especially if you are an older patient. This reduces the risk of dizzy or fainting spells. Alcohol can make you more  drowsy and dizzy. Avoid alcoholic drinks. Do not treat yourself for coughs, colds, or pain while you are taking this medicine without asking your doctor or health care professional for advice. Some ingredients may increase your blood pressure. What side effects may I notice from receiving this medicine? Side effects that you should report to your doctor or health care professional as soon as possible: -blurred vision -dry mouth -skin rash -sweating -the feeling of extreme pressure in the head -unusually weak or tired Side effects that usually do not require medical attention (report to your doctor or health care professional if they continue or are bothersome): -flushing of the face or neck -headache -irregular heartbeat, palpitations -nausea, vomiting This list may not describe all possible side effects. Call your doctor for medical advice about side effects. You may report side effects to FDA at 1-800-FDA-1088. Where should I keep my medicine? Keep out of the reach of children. Store at room temperature between 20 and 25 degrees C (68 and 77 degrees F). Store in [employer]. Protect from light and moisture. Keep tightly closed. Throw away any unused medicine after the expiration date. NOTE: This sheet is a summary. It may not cover all possible information. If you have questions about this medicine, talk to your doctor, pharmacist, or health care provider.    2016, Elsevier/Gold Standard. (2013-07-22 17:57:36)

## 2016-06-13 LAB — BASIC METABOLIC PANEL
BUN / CREAT RATIO: 13 (ref 9–20)
BUN: 10 mg/dL (ref 6–24)
CHLORIDE: 101 mmol/L (ref 96–106)
CO2: 20 mmol/L (ref 18–29)
Calcium: 9.8 mg/dL (ref 8.7–10.2)
Creatinine, Ser: 0.79 mg/dL (ref 0.76–1.27)
GFR calc Af Amer: 127 mL/min/{1.73_m2} (ref >59)
GFR calc non Af Amer: 110 mL/min/{1.73_m2} (ref >59)
GLUCOSE: 104 mg/dL — AB (ref 65–99)
Potassium: 4.7 mmol/L (ref 3.5–5.2)
SODIUM: 140 mmol/L (ref 134–144)

## 2016-06-13 LAB — CBC WITH DIFFERENTIAL/PLATELET
BASOS ABS: 0 10*3/uL (ref 0.0–0.2)
Basos: 1 %
EOS (ABSOLUTE): 0.5 10*3/uL — AB (ref 0.0–0.4)
Eos: 5 %
Hematocrit: 46.9 % (ref 37.5–51.0)
Hemoglobin: 17 g/dL (ref 12.6–17.7)
IMMATURE GRANS (ABS): 0 10*3/uL (ref 0.0–0.1)
IMMATURE GRANULOCYTES: 0 %
LYMPHS: 32 %
Lymphocytes Absolute: 2.6 10*3/uL (ref 0.7–3.1)
MCH: 32.6 pg (ref 26.6–33.0)
MCHC: 36.2 g/dL — ABNORMAL HIGH (ref 31.5–35.7)
MCV: 90 fL (ref 79–97)
Monocytes Absolute: 0.6 10*3/uL (ref 0.1–0.9)
Monocytes: 7 %
NEUTROS ABS: 4.6 10*3/uL (ref 1.4–7.0)
NEUTROS PCT: 55 %
PLATELETS: 267 10*3/uL (ref 150–379)
RBC: 5.21 x10E6/uL (ref 4.14–5.80)
RDW: 13.8 % (ref 12.3–15.4)
WBC: 8.3 10*3/uL (ref 3.4–10.8)

## 2016-06-13 LAB — PROTIME-INR
INR: 1 (ref 0.8–1.2)
Prothrombin Time: 10.5 s (ref 9.1–12.0)

## 2016-06-13 LAB — LIPID PANEL
CHOL/HDL RATIO: 6 ratio — AB (ref 0.0–5.0)
Cholesterol, Total: 275 mg/dL — ABNORMAL HIGH (ref 100–199)
HDL: 46 mg/dL (ref >39)
LDL CALC: 176 mg/dL — AB (ref 0–99)
Triglycerides: 263 mg/dL — ABNORMAL HIGH (ref 0–149)
VLDL Cholesterol Cal: 53 mg/dL — ABNORMAL HIGH (ref 5–40)

## 2016-06-14 ENCOUNTER — Telehealth: Payer: Self-pay | Admitting: Cardiology

## 2016-06-14 NOTE — Telephone Encounter (Signed)
Reviewed cath instructions w/pt who verbalized understanding. He had no questions at this time.

## 2016-06-17 ENCOUNTER — Encounter: Payer: Self-pay | Admitting: Anesthesiology

## 2016-06-17 ENCOUNTER — Encounter: Payer: Self-pay | Admitting: *Deleted

## 2016-06-17 ENCOUNTER — Ambulatory Visit
Admission: RE | Admit: 2016-06-17 | Discharge: 2016-06-17 | Disposition: A | Discharge status: Home / Self Care | Payer: BLUE CROSS/BLUE SHIELD | Source: Ambulatory Visit | Attending: Cardiovascular Disease | Admitting: Cardiovascular Disease

## 2016-06-17 ENCOUNTER — Encounter
Admission: RE | Disposition: A | Discharge status: Home / Self Care | Payer: Self-pay | Source: Ambulatory Visit | Attending: Cardiovascular Disease

## 2016-06-17 ENCOUNTER — Telehealth: Payer: Self-pay | Admitting: *Deleted

## 2016-06-17 DIAGNOSIS — Z823 Family history of stroke: Secondary | ICD-10-CM | POA: Insufficient documentation

## 2016-06-17 DIAGNOSIS — M5030 Other cervical disc degeneration, unspecified cervical region: Secondary | ICD-10-CM | POA: Insufficient documentation

## 2016-06-17 DIAGNOSIS — R079 Chest pain, unspecified: Secondary | ICD-10-CM

## 2016-06-17 DIAGNOSIS — I10 Essential (primary) hypertension: Secondary | ICD-10-CM | POA: Insufficient documentation

## 2016-06-17 DIAGNOSIS — Z79899 Other long term (current) drug therapy: Secondary | ICD-10-CM | POA: Insufficient documentation

## 2016-06-17 DIAGNOSIS — Z8249 Family history of ischemic heart disease and other diseases of the circulatory system: Secondary | ICD-10-CM | POA: Diagnosis not present

## 2016-06-17 DIAGNOSIS — F17200 Nicotine dependence, unspecified, uncomplicated: Secondary | ICD-10-CM | POA: Diagnosis not present

## 2016-06-17 DIAGNOSIS — R0602 Shortness of breath: Secondary | ICD-10-CM

## 2016-06-17 DIAGNOSIS — I2510 Atherosclerotic heart disease of native coronary artery without angina pectoris: Secondary | ICD-10-CM | POA: Insufficient documentation

## 2016-06-17 DIAGNOSIS — Z888 Allergy status to other drugs, medicaments and biological substances status: Secondary | ICD-10-CM | POA: Diagnosis not present

## 2016-06-17 DIAGNOSIS — R072 Precordial pain: Secondary | ICD-10-CM | POA: Diagnosis not present

## 2016-06-17 DIAGNOSIS — G4730 Sleep apnea, unspecified: Secondary | ICD-10-CM | POA: Insufficient documentation

## 2016-06-17 DIAGNOSIS — Z833 Family history of diabetes mellitus: Secondary | ICD-10-CM | POA: Diagnosis not present

## 2016-06-17 HISTORY — DX: Angina pectoris, unspecified: I20.9

## 2016-06-17 HISTORY — DX: Gastro-esophageal reflux disease without esophagitis: K21.9

## 2016-06-17 HISTORY — PX: CARDIAC CATHETERIZATION: SHX172

## 2016-06-17 SURGERY — LEFT HEART CATH AND CORONARY ANGIOGRAPHY
Anesthesia: Moderate Sedation | Laterality: Left

## 2016-06-17 MED ORDER — SODIUM CHLORIDE 0.9 % IV SOLN: 250.0000 mL | INTRAVENOUS | Status: DC | PRN

## 2016-06-17 MED ORDER — SODIUM CHLORIDE 0.9% FLUSH: 3.0000 mL | INTRAVENOUS | Status: DC | PRN

## 2016-06-17 MED ORDER — MIDAZOLAM HCL 2 MG/2ML IJ SOLN
INTRAMUSCULAR | Status: AC
  Filled 2016-06-17: qty 2

## 2016-06-17 MED ORDER — VERAPAMIL HCL 2.5 MG/ML IV SOLN
INTRAVENOUS | Status: DC | PRN
  Administered 2016-06-17: 2.5 mg via INTRAVENOUS

## 2016-06-17 MED ORDER — SODIUM CHLORIDE 0.9 % WEIGHT BASED INFUSION
3.0000 mL/kg/h | INTRAVENOUS | Status: DC
  Administered 2016-06-17: 3 mL/kg/h via INTRAVENOUS

## 2016-06-17 MED ORDER — SODIUM CHLORIDE 0.9 % WEIGHT BASED INFUSION: 1.0000 mL/kg/h | INTRAVENOUS | Status: DC

## 2016-06-17 MED ORDER — FENTANYL CITRATE (PF) 100 MCG/2ML IJ SOLN
INTRAMUSCULAR | Status: DC | PRN
  Administered 2016-06-17: 50 ug via INTRAVENOUS

## 2016-06-17 MED ORDER — HEPARIN SODIUM (PORCINE) 1000 UNIT/ML IJ SOLN
INTRAMUSCULAR | Status: DC | PRN
  Administered 2016-06-17: 5000 [IU] via INTRAVENOUS

## 2016-06-17 MED ORDER — HEPARIN SODIUM (PORCINE) 1000 UNIT/ML IJ SOLN
INTRAMUSCULAR | Status: AC
  Filled 2016-06-17: qty 1

## 2016-06-17 MED ORDER — ATORVASTATIN CALCIUM 40 MG PO TABS: 80.0000 mg | ORAL_TABLET | Freq: Every day | ORAL | 6 refills | Status: DC

## 2016-06-17 MED ORDER — SODIUM CHLORIDE 0.9% FLUSH: 3.0000 mL | Freq: Two times a day (BID) | INTRAVENOUS | Status: DC

## 2016-06-17 MED ORDER — HEPARIN (PORCINE) IN NACL 2-0.9 UNIT/ML-% IJ SOLN
INTRAMUSCULAR | Status: AC
  Filled 2016-06-17: qty 500

## 2016-06-17 MED ORDER — MIDAZOLAM HCL 2 MG/2ML IJ SOLN
INTRAMUSCULAR | Status: DC | PRN
  Administered 2016-06-17: 1 mg via INTRAVENOUS

## 2016-06-17 MED ORDER — ASPIRIN 81 MG PO CHEW: 81.0000 mg | CHEWABLE_TABLET | ORAL | Status: DC

## 2016-06-17 MED ORDER — VERAPAMIL HCL 2.5 MG/ML IV SOLN
INTRAVENOUS | Status: AC
  Filled 2016-06-17: qty 2

## 2016-06-17 MED ORDER — FENTANYL CITRATE (PF) 100 MCG/2ML IJ SOLN
INTRAMUSCULAR | Status: AC
  Filled 2016-06-17: qty 2

## 2016-06-17 SURGICAL SUPPLY — 7 items
CATH INFINITI 5FR ANG PIGTAIL (CATHETERS) ×2 IMPLANT
CATH OPTITORQUE JACKY 4.0 5F (CATHETERS) ×2 IMPLANT
DEVICE RAD TR BAND REGULAR (VASCULAR PRODUCTS) ×2 IMPLANT
GLIDESHEATH SLEND SS 6F .021 (SHEATH) ×2 IMPLANT
KIT MANI 3VAL PERCEP (MISCELLANEOUS) ×3 IMPLANT
PACK CARDIAC CATH (CUSTOM PROCEDURE TRAY) ×3 IMPLANT
WIRE SAFE-T 1.5MM-J .035X260CM (WIRE) ×2 IMPLANT

## 2016-06-17 NOTE — Telephone Encounter (Signed)
Spoke with patient and reviewed lab results and recommendations by Dr. Yvone Neu. Instructed him to increase atorvastatin to 80 mg daily and that new prescription was sent in for this. Patient also had lab work that was done 5 days ago and called [facility] and they are adding this to current sample. Let patient know that if we needed him to come back in I would be in touch. He verbalized understanding and had no further questions.

## 2016-06-17 NOTE — H&P (View-Only) (Signed)
Cardiology Office Note   Date:  06/12/2016   ID:  Omar Turner, DOB Nov 06, 1970, MRN [id number]  Referring Doctor:  Adin Hector, MD   Cardiologist:   Wende Bushy, MD   Reason for consultation:  Chief Complaint  Patient presents with  . Other    Follow up from [facility] ER; chest pain and shortness of breath. Meds reviewed by the patient verbally. Pt. still c/o shortness of breath, chest pain and palpitations.       History of Present Illness: Omar Turner is a 45 y.o. male who presents for Evaluation of chest pain and shortness of breath. Symptoms started last September 1, Friday. After lunch at work, he had sudden onset of chest pain, this was described as severe tightness in the center of the chest, radiating down the left arm. It lasted for a few minutes before they decided to call 911. This was associated with pallor, sweating, and shortness of breath. He was in the ER for more than 2 hours. He received different medications including nitroglycerin, morphine, etc. Eventually, the chest pain resolved. He was ruled out for acute coronary syndrome and was advised to go for outpatient follow-up with cardiology.   He has not had any more chest pains. But he continues to have shortness of breath especially with exertion and have easy fatigability. He oh patient also has palpitations. No lightheadedness or dizziness. No abdominal pain. He uses NSAIDs for chronic joint pains.  ROS:  Please see the history of present illness. Aside from mentioned under HPI, all other systems are reviewed and negative.     Past Medical History:  Diagnosis Date  . Degenerative disc disease, cervical   . Hypertension   . Sleep apnea     Past Surgical History:  Procedure Laterality Date  . COLONOSCOPY    . COLONOSCOPY WITH PROPOFOL N/A 09/07/2015   Procedure: COLONOSCOPY WITH PROPOFOL;  Surgeon: Hulen Luster, MD;  Location: [facility] ENDOSCOPY;  Service: Gastroenterology;  Laterality: N/A;  . HERNIA  REPAIR       reports that he has been smoking.  He has a 15.00 pack-year smoking history. He has never used smokeless tobacco. He reports that he drinks about 1.2 oz of alcohol per week . He reports that he does not use drugs. He continues to smoke one to one and a half packs per day.  family history includes Heart attack in his father; Heart disease in his father; Hypertension in his father. Significant family history of premature CAD in father, and maternal grandmother. There is also diabetes in mother's side. There is history of stroke in paternal side.  Outpatient Medications Prior to Visit  Medication Sig Dispense Refill  . amLODipine (NORVASC) 5 MG tablet Take 5 mg by mouth daily.    . pantoprazole (PROTONIX) 40 MG tablet Take 40 mg by mouth daily.    [name] buPROPion (WELLBUTRIN SR) 150 MG 12 hr tablet Take 150 mg by mouth 2 (two) times daily.    . hydrocortisone (ANUSOL-HC) 25 MG suppository Place 25 mg rectally 2 (two) times daily.    . predniSONE (DELTASONE) 10 MG tablet Take 10 mg by mouth daily with breakfast.     No facility-administered medications prior to visit.      Allergies: Chantix [varenicline]; Hydrocodone-homatropine; and Lexapro [escitalopram]    PHYSICAL EXAM: VS:  BP (!) 140/98 (BP Location: Right Arm, Patient Position: Sitting, Cuff Size: Normal)   Pulse 88   Ht 5\' 11"  (  1.803 m)   Wt 221 lb 12 oz (100.6 kg)   BMI 30.93 kg/m  , Body mass index is 30.93 kg/m. Wt Readings from Last 3 Encounters:  06/12/16 221 lb 12 oz (100.6 kg)  09/07/15 220 lb (99.8 kg)    GENERAL:  well developed, well nourished, obese, not in acute distress HEENT: normocephalic, pink conjunctivae, anicteric sclerae, no xanthelasma, normal dentition, oropharynx clear NECK:  no neck vein engorgement, JVP normal, no hepatojugular reflux, carotid upstroke brisk and symmetric, no bruit, no thyromegaly, no lymphadenopathy LUNGS:  good respiratory effort, clear to auscultation bilaterally CV:  PMI  not displaced, no thrills, no lifts, S1 and S2 within normal limits, no palpable S3 or S4, no murmurs, no rubs, no gallops ABD:  Soft, nontender, nondistended, normoactive bowel sounds, no abdominal aortic bruit, no hepatomegaly, no splenomegaly MS: nontender back, no kyphosis, no scoliosis, no joint deformities EXT:  2+ DP/PT pulses, no edema, no varicosities, no cyanosis, no clubbing SKIN: warm, nondiaphoretic, normal turgor, no ulcers NEUROPSYCH: alert, oriented to person, place, and time, sensory/motor grossly intact, normal mood, appropriate affect  Recent Labs: 06/07/2016: BUN 5; Creatinine, Ser 0.71; Hemoglobin 16.5; Platelets 284; Potassium 3.6; Sodium 138   Lipid Panel No results found for: CHOL, TRIG, HDL, CHOLHDL, VLDL, LDLCALC, LDLDIRECT  From care everywhere 08/21/2015: Total cholesterol 252 Triglyceride 327 LDL 146 neck and HDL 40.6  Other studies Reviewed:  EKG:  The ekg from 06/12/2016 was personally reviewed by me and it revealed sinus rhythm, 88 BPM.  Additional studies/ records that were reviewed personally reviewed by me today include: None available   ASSESSMENT AND PLAN:  Chest pain Shortness of breath Symptoms concerning for true angina  Patient has multiple risk factors for CAD including gender, hypertension, untreated hyperlipidemia, significant smoking history, and significant family history of premature CAD Discussed with patient recommendation of proceeding with left heart catheterization. His pretest probability for CAD is high enough that warrants a more definitive evaluation. We had a detailed discussion of the nature of left heart catheterization, benefits, and risks, including but not limited to less than 1% chance of major problems including MI, CVA, death, bleeding, etc. Mother also present in the room during this discussion. Patient verbalized understanding would like to proceed. He preferred to have the procedure done by Dr. Fletcher Anon since Dr. Fletcher Anon  did this procedure on his stepfather. We will set him up for this procedure. Patient prescribed ASA 81mg  po qd, and NTG SL prn for chest pain. Patient instructed to call 911 for unrelenting chest pain.  Recommend fasting lipid panel for baseline, but start atorvastatin 40 mg by mouth daily at bedtime.  Hypertension Recommend to monitor blood pressure at home. Patient to call our office his persistently greater than 140/80. We can either go up on the dose of amlodipine or start beta blocker.  Hyperlipidemia As discussed above, check lipid panel and start statin therapy.  Tobacco use We discussed the importance of smoking cessation and different strategies for quitting.    Current medicines are reviewed at length with the patient today.  The patient does not have concerns regarding medicines.  Labs/ tests ordered today include:  Orders Placed This Encounter  Procedures  . Lipid Profile  . CBC with Differential/Platelet  . Basic Metabolic Panel (BMET)  . INR/PT  . EKG 12-Lead    I had a lengthy and detailed discussion with the patient regarding diagnoses, prognosis, diagnostic options, treatment options , and side effects of medications.   I  counseled the patient on importance of lifestyle modification including heart healthy diet, regular physical activity (once LHC is done), and smoking cessation.   Disposition:   FU with undersigned after tests   I spent at least 80 minutes with the patient today and more than 50% of the time was spent counseling the patient and coordinating care.     Signed, Wende Bushy, MD  06/12/2016 11:39 AM    [place]  This note was generated in part with voice recognition software and I apologize for any typographical errors that were not detected and corrected.

## 2016-06-17 NOTE — Telephone Encounter (Signed)
Thank you :)

## 2016-06-17 NOTE — Interval H&P Note (Signed)
Cath Lab Visit (complete for each Cath Lab visit)  Clinical Evaluation Leading to the Procedure:   ACS: No.  Non-ACS:    Anginal Classification: CCS III  Anti-ischemic medical therapy: Minimal Therapy (1 class of medications)  Non-Invasive Test Results: No non-invasive testing performed  Prior CABG: No previous CABG      History and Physical Interval Note:  06/17/2016 10:37 AM  Omar Turner  has presented today for surgery, with the diagnosis of LT cath   Chest pain  Angina  The various methods of treatment have been discussed with the patient and family. After consideration of risks, benefits and other options for treatment, the patient has consented to  Procedure(s): Left Heart Cath and Coronary Angiography (Left) as a surgical intervention .  The patient's history has been reviewed, patient examined, no change in status, stable for surgery.  I have reviewed the patient's chart and labs.  Questions were answered to the patient's satisfaction.     Kathlyn [place]

## 2016-06-17 NOTE — Telephone Encounter (Signed)
-----   Message from Wende Bushy, MD sent at 06/17/2016 12:04 PM EDT ----- Kidney function ok. Chol very high. I believe we started him on statin on his visit. May need to do atorvastatin 80 po qhs. We need lfts pls.

## 2016-06-19 ENCOUNTER — Telehealth: Payer: Self-pay | Admitting: Cardiology

## 2016-06-19 LAB — HEPATIC FUNCTION PANEL
ALBUMIN: 4.7 g/dL (ref 3.5–5.5)
ALK PHOS: 98 IU/L (ref 39–117)
ALT: 42 IU/L (ref 0–44)
AST: 43 IU/L — AB (ref 0–40)
Bilirubin, Direct: 0.07 mg/dL (ref 0.00–0.40)
TOTAL PROTEIN: 7.6 g/dL (ref 6.0–8.5)

## 2016-06-19 LAB — SPECIMEN STATUS REPORT

## 2016-06-19 NOTE — Telephone Encounter (Signed)
Verbal Order Form signed by MD and faxed back to [place] copy placed in "Faxed Bin" on [name]'s desk.

## 2016-06-25 NOTE — Progress Notes (Signed)
Cardiology Office Note   Date:  07/03/2016   ID:  Omar Turner, DOB Jan 23, 1971, MRN [id number]  Referring Doctor:  Adin Hector, MD   Cardiologist:   Wende Bushy, MD   Reason for consultation:  Chief Complaint  Patient presents with  . other    3 week follow up s/p cardiac cath. Meds reviewed by the pt. verbally. "doing well." Pt. c/o BP still elevated with occas. chest tightness and weakness.       History of Present Illness: Omar Turner is a 45 y.o. male who presents for Follow-up after left heart catheterization   Patient overall is doing well. He continues to have infrequent episodes of mild tightness in the chest. His blood pressure continued to be elevated at times with systolics in the Q000111Q and diastolics in the [id number] sometimes.   In terms of sleep apnea, he does have CPAP but is not using it every night. He does not know exactly who monitors his pressures. He continues to feel tired.  ROS:  Please see the history of present illness. Aside from mentioned under HPI, all other systems are reviewed and negative.     Past Medical History:  Diagnosis Date  . Anginal pain ([place])   . Degenerative disc disease, cervical   . GERD (gastroesophageal reflux disease)   . Hypertension   . Sleep apnea     Past Surgical History:  Procedure Laterality Date  . APPENDECTOMY    . CARDIAC CATHETERIZATION Left 06/17/2016   Procedure: Left Heart Cath and Coronary Angiography;  Surgeon: Wellington Hampshire, MD;  Location: [facility] [place] [facility] LAB;  Service: Cardiovascular;  Laterality: Left;  . COLONOSCOPY    . COLONOSCOPY WITH PROPOFOL N/A 09/07/2015   Procedure: COLONOSCOPY WITH PROPOFOL;  Surgeon: Hulen Luster, MD;  Location: [facility] ENDOSCOPY;  Service: Gastroenterology;  Laterality: N/A;  . HERNIA REPAIR       reports that he has been smoking.  He has a 15.00 pack-year smoking history. He has never used smokeless tobacco. He reports that he drinks about 1.2 oz of alcohol per week .  He reports that he does not use drugs. He continues to smoke one to one and a half packs per day.  family history includes Heart attack in his father; Heart disease in his father; Hypertension in his father. Significant family history of premature CAD in father, and maternal grandmother. There is also diabetes in mother's side. There is history of stroke in paternal side.  Outpatient Medications Prior to Visit  Medication Sig Dispense Refill  . amLODipine (NORVASC) 5 MG tablet Take 5 mg by mouth daily.    [name] aspirin EC 81 MG tablet Take 1 tablet (81 mg total) by mouth daily. 90 tablet 3  . atorvastatin (LIPITOR) 40 MG tablet Take 2 tablets (80 mg total) by mouth daily. 30 tablet 6  . loratadine (ALLERGY RELIEF) 10 MG tablet Take 10 mg by mouth daily as needed for allergies.    . nitroGLYCERIN (NITROSTAT) 0.4 MG SL tablet Place 1 tablet (0.4 mg total) under the tongue every 5 (five) minutes as needed. 25 tablet 6  . pantoprazole (PROTONIX) 40 MG tablet Take 40 mg by mouth daily.     No facility-administered medications prior to visit.      Allergies: Chantix [varenicline]; Hydrocodone-homatropine; and Lexapro [escitalopram]    PHYSICAL EXAM: VS:  BP (!) 134/98 (BP Location: Left Arm, Patient Position: Sitting, Cuff Size: Normal)   Pulse 91  Ht 5\' 11"  (1.803 m)   Wt 228 lb (103.4 kg)   BMI 31.80 kg/m  , Body mass index is 31.8 kg/m. Wt Readings from Last 3 Encounters:  07/03/16 228 lb (103.4 kg)  06/12/16 221 lb 12 oz (100.6 kg)  09/07/15 220 lb (99.8 kg)    GENERAL:  well developed, well nourished, obese, not in acute distress HEENT: normocephalic, pink conjunctivae, anicteric sclerae, no xanthelasma, normal dentition, oropharynx clear NECK:  no neck vein engorgement, JVP normal, no hepatojugular reflux, carotid upstroke brisk and symmetric, no bruit, no thyromegaly, no lymphadenopathy LUNGS:  good respiratory effort, clear to auscultation bilaterally CV:  PMI not displaced, no  thrills, no lifts, S1 and S2 within normal limits, no palpable S3 or S4, no murmurs, no rubs, no gallops ABD:  Soft, nontender, nondistended, normoactive bowel sounds, no abdominal aortic bruit, no hepatomegaly, no splenomegaly MS: nontender back, no kyphosis, no scoliosis, no joint deformities EXT:  2+ DP/PT pulses, no edema, no varicosities, no cyanosis, no clubbing SKIN: warm, nondiaphoretic, normal turgor, no ulcers NEUROPSYCH: alert, oriented to person, place, and time, sensory/motor grossly intact, normal mood, appropriate affect  Recent Labs: 06/07/2016: Hemoglobin 16.5 06/12/2016: ALT 42; BUN 10; Creatinine, Ser 0.79; Platelets 267; Potassium 4.7; Sodium 140   Lipid Panel    Component Value Date/Time   CHOL 275 (H) 06/12/2016 1010   TRIG 263 (H) 06/12/2016 1010   HDL 46 06/12/2016 1010   CHOLHDL 6.0 (H) 06/12/2016 1010   LDLCALC 176 (H) 06/12/2016 1010   From care everywhere 08/21/2015: Total cholesterol 252 Triglyceride 327 LDL 146 HDL 40.6  Other studies Reviewed:  EKG:  The ekg from 06/12/2016 was personally reviewed by me and it revealed sinus rhythm, 88 BPM.   Additional studies/ records that were reviewed personally reviewed by me today include:  LHC 06/17/2016:  Mid LAD lesion, 10 %stenosed.  Ost LAD lesion, 10 %stenosed.  The left ventricular systolic function is normal.  LV end diastolic pressure is normal.  The left ventricular ejection fraction is 55-65% by visual estimate.   1. Minor irregularities affecting the LAD with no evidence of obstructive disease. Left dominant system. 2. Normal LV systolic function and high normal left ventricular end-diastolic pressure.   ASSESSMENT AND PLAN:  Chest pain Shortness of breath Nonobstructive CAD, LAD disease  Patient has multiple risk factors for CAD including gender, hypertension, untreated hyperlipidemia, significant smoking history, and significant family history of premature CAD I had a lengthy and  detailed discussion with the patient regarding the diagnosis of CAD, importance of risk factor control or modification, and compliance with medications. Patient to continue ASA 81mg  po qd, and NTG SL prn for chest pain.  Cont atorvastatin 80 mg by mouth daily at bedtime. LDL goal < 70. We will start metoprolol XL 25 mg by mouth daily. We discussed possible side effects. We talked in great detail about importance of lifestyle changes including heart healthy, low-fat, low carbohydrate, low sodium diet. He also talked about importance of increasing physical activity that his symptom limited. All these with a goal of achieving weight loss. Check echocardiogram.  Hypertension Start metoprolol XL 25 mg by mouth daily  Recommend to monitor blood pressure at home. Patient to call our office if persistently greater than 140/80.   Hyperlipidemia LDL goal < 70 due to CAD. Cont statin therapy. Rpt FLP, CMP in 3 months. We have provided the patient a copy of his most recent lipid panel.  Fatigue Nonspecific. Likely multifactorial. May be  related to uncontrolled hypertension, sleep apnea, deconditioning. Check TSH and fT4. Recommend compliance to CPAP. Blood pressure control as above. Lifestyle changes as discussed. Ffup with PCP.  Tobacco use We discussed the importance of smoking cessation and different strategies for quitting.    Current medicines are reviewed at length with the patient today.  The patient does not have concerns regarding medicines.  Labs/ tests ordered today include:  Orders Placed This Encounter  Procedures  . TSH  . T4, free  . EKG 12-Lead  . ECHOCARDIOGRAM COMPLETE    I had a lengthy and detailed discussion with the patient regarding diagnoses, prognosis, diagnostic options, treatment options, and side effects of medications.   I counseled the patient on importance of lifestyle modification including heart healthy diet, regular physical activity, and smoking  cessation.   Disposition:   FU with undersigned in 3 months  I spent at least 40 minutes with the patient today and more than 50% of the time was spent counseling the patient and coordinating care.     Signed, Wende Bushy, MD  07/03/2016 9:22 AM    [place]  This note was generated in part with voice recognition software and I apologize for any typographical errors that were not detected and corrected.

## 2016-07-03 ENCOUNTER — Encounter: Payer: Self-pay | Admitting: Cardiology

## 2016-07-03 ENCOUNTER — Ambulatory Visit (INDEPENDENT_AMBULATORY_CARE_PROVIDER_SITE_OTHER): Payer: BLUE CROSS/BLUE SHIELD | Admitting: Cardiology

## 2016-07-03 VITALS — BP 134/98 | HR 91 | Ht 71.0 in | Wt 228.0 lb

## 2016-07-03 DIAGNOSIS — I2510 Atherosclerotic heart disease of native coronary artery without angina pectoris: Secondary | ICD-10-CM | POA: Diagnosis not present

## 2016-07-03 DIAGNOSIS — I10 Essential (primary) hypertension: Secondary | ICD-10-CM | POA: Diagnosis not present

## 2016-07-03 DIAGNOSIS — E785 Hyperlipidemia, unspecified: Secondary | ICD-10-CM

## 2016-07-03 DIAGNOSIS — F17200 Nicotine dependence, unspecified, uncomplicated: Secondary | ICD-10-CM

## 2016-07-03 DIAGNOSIS — R5382 Chronic fatigue, unspecified: Secondary | ICD-10-CM

## 2016-07-03 DIAGNOSIS — I159 Secondary hypertension, unspecified: Secondary | ICD-10-CM | POA: Diagnosis not present

## 2016-07-03 MED ORDER — METOPROLOL SUCCINATE ER 25 MG PO TB24: 25.0000 mg | ORAL_TABLET | Freq: Every day | ORAL | 3 refills | Status: DC

## 2016-07-03 NOTE — Patient Instructions (Addendum)
Make sure you increase physical activity, wear your CPAP nightly and try to improve diet. Attached below are materials regarding diet. Also make sure to monitor your blood pressure readings and keep a log of these to bring to your next visit. Please call if blood pressures remain greater than 140/80.   Medication Instructions:  Your physician has recommended you make the following change in your medication:  1. START Toprol XL 25 mg Once daily 2. TAKE Atorvastatin 40 mg 2 tablets daily at bedtime.  Labwork: Labs done today were TSH and Free T 4 to check your thyroid function. I will call you with these results.   Your physician recommends that you return for lab work in: 3 months we will need to recheck your lipid profile and CMP. Make sure not to eat or drink after midnight prior to coming in for your labs.    Testing/Procedures: Your physician has requested that you have an echocardiogram. Echocardiography is a painless test that uses sound waves to create images of your heart. It provides your doctor with information about the size and shape of your heart and how well your heart's chambers and valves are working. This procedure takes approximately one hour. There are no restrictions for this procedure.    Follow-Up: Your physician recommends that you schedule a follow-up appointment in: 3 months with Dr. Yvone Neu.   It was a pleasure seeing you today here in the office. Please do not hesitate to give [place] a call back if you have any further questions. [place], BSN        Heart-Healthy Eating Plan Heart-healthy meal planning includes:  Limiting unhealthy fats.  Increasing healthy fats.  Making other small dietary changes. You may need to talk with your doctor or a diet specialist (dietitian) to create an eating plan that is right for you. WHAT TYPES OF FAT SHOULD I CHOOSE?  Choose healthy fats. These include olive oil and canola oil, flaxseeds, walnuts,  almonds, and seeds.  Eat more omega-3 fats. These include salmon, mackerel, sardines, tuna, flaxseed oil, and ground flaxseeds. Try to eat fish at least twice each week.  Limit saturated fats.  Saturated fats are often found in animal products, such as meats, butter, and cream.  Plant sources of saturated fats include palm oil, palm kernel oil, and coconut oil.  Avoid foods with partially hydrogenated oils in them. These include stick margarine, some tub margarines, cookies, crackers, and other baked goods. These contain trans fats. WHAT GENERAL GUIDELINES DO I NEED TO FOLLOW?  Check food labels carefully. Identify foods with trans fats or high amounts of saturated fat.  Fill one half of your plate with vegetables and green salads. Eat 4-5 servings of vegetables per day. A serving of vegetables is:  1 cup of raw leafy vegetables.   cup of raw or cooked cut-up vegetables.   cup of vegetable juice.  Fill one fourth of your plate with whole grains. Look for the word "whole" as the first word in the ingredient list.  Fill one fourth of your plate with lean protein foods.  Eat 4-5 servings of fruit per day. A serving of fruit is:  One medium whole fruit.   cup of dried fruit.   cup of fresh, frozen, or canned fruit.   cup of 100% fruit juice.  Eat more foods that contain soluble fiber. These include apples, broccoli, carrots, beans, peas, and barley. Try to get 20-30 g of fiber per day.  Eat more home-cooked food. Eat less restaurant, buffet, and fast food.  Limit or avoid alcohol.  Limit foods high in starch and sugar.  Avoid fried foods.  Avoid frying your food. Try baking, boiling, grilling, or broiling it instead. You can also reduce fat by:  Removing the skin from poultry.  Removing all visible fats from meats.  Skimming the fat off of stews, soups, and gravies before serving them.  Steaming vegetables in water or broth.  Lose weight if you are  overweight.  Eat 4-5 servings of nuts, legumes, and seeds per week:  One serving of dried beans or legumes equals  cup after being cooked.  One serving of nuts equals 1 ounces.  One serving of seeds equals  ounce or one tablespoon.  You may need to keep track of how much salt or sodium you eat. This is especially true if you have high blood pressure. Talk with your doctor or dietitian to get more information. WHAT FOODS CAN I EAT? Grains Breads, including [place], white, pita, wheat, raisin, rye, oatmeal, and [place]. Tortillas that are neither fried nor made with lard or trans fat. Low-fat rolls, including hotdog and hamburger buns and English muffins. Biscuits. Muffins. Waffles. Pancakes. Light popcorn. Whole-grain cereals. Flatbread. Melba toast. Pretzels. Breadsticks. Rusks. Low-fat snacks. Low-fat crackers, including oyster, saltine, matzo, graham, animal, and rye. Rice and pasta, including brown rice and pastas that are made with whole wheat.  Vegetables All vegetables.  Fruits All fruits, but limit coconut. Meats and Other Protein Sources Lean, well-trimmed beef, veal, pork, and lamb. Chicken and [place] without skin. All fish and shellfish. Wild duck, rabbit, pheasant, and venison. Egg whites or low-cholesterol egg substitutes. Dried beans, peas, lentils, and tofu. Seeds and most nuts. Dairy Low-fat or nonfat cheeses, including ricotta, string, and mozzarella. Skim or 1% milk that is liquid, powdered, or evaporated. Buttermilk that is made with low-fat milk. Nonfat or low-fat yogurt. Beverages Mineral water. Diet carbonated beverages. Sweets and Desserts Sherbets and fruit ices. Honey, jam, marmalade, jelly, and syrups. Meringues and gelatins. Pure sugar candy, such as hard candy, jelly beans, gumdrops, mints, marshmallows, and small amounts of dark chocolate. [employer] Eat all sweets and desserts in moderation. Fats and Oils Nonhydrogenated (trans-free) margarines.  Vegetable oils, including soybean, sesame, sunflower, olive, peanut, safflower, corn, canola, and cottonseed. Salad dressings or mayonnaise made with a vegetable oil. Limit added fats and oils that you use for cooking, baking, salads, and as spreads. Other Cocoa powder. Coffee and tea. All seasonings and condiments. The items listed above may not be a complete list of recommended foods or beverages. Contact your dietitian for more options. WHAT FOODS ARE NOT RECOMMENDED? Grains Breads that are made with saturated or trans fats, oils, or whole milk. Croissants. Butter rolls. Cheese breads. Sweet rolls. Donuts. Buttered popcorn. Chow mein noodles. High-fat crackers, such as cheese or butter crackers. Meats and Other Protein Sources Fatty meats, such as hotdogs, short ribs, sausage, spareribs, bacon, rib eye roast or steak, and mutton. High-fat deli meats, such as salami and bologna. Caviar. Domestic duck and goose. Organ meats, such as kidney, liver, sweetbreads, and heart. Dairy Cream, sour cream, cream cheese, and creamed cottage cheese. Whole-milk cheeses, including blue (bleu), [place], [place], [place], [place], [place], Swiss, cheddar, [place], and [place]. Whole or 2% milk that is liquid, evaporated, or condensed. Whole buttermilk. Cream sauce or high-fat cheese sauce. Yogurt that is made from whole milk. Beverages Regular sodas and juice drinks with added sugar.  Sweets and Desserts Frosting. Pudding. Cookies. Cakes other than angel food cake. Candy that has milk chocolate or white chocolate, hydrogenated fat, butter, coconut, or unknown ingredients. Buttered syrups. Full-fat ice cream or ice cream drinks. Fats and Oils Gravy that has suet, meat fat, or shortening. Cocoa butter, hydrogenated oils, palm oil, coconut oil, palm kernel oil. These can often be found in baked products, candy, fried foods, nondairy creamers, and whipped toppings. Solid fats and shortenings, including bacon fat,  salt pork, lard, and butter. Nondairy cream substitutes, such as coffee creamers and sour cream substitutes. Salad dressings that are made of unknown oils, cheese, or sour cream. The items listed above may not be a complete list of foods and beverages to avoid. Contact your dietitian for more information.   This information is not intended to replace advice given to you by your health care provider. Make sure you discuss any questions you have with your health care provider.   Document Released: 03/24/2012 Document Revised: 10/14/2014 Document Reviewed: 03/17/2014 Elsevier Interactive Patient Education 2016 Else[employer] Inc.    Fat and Cholesterol Restricted Diet Getting too much fat and cholesterol in your diet may cause health problems. Following this diet helps keep your fat and cholesterol at normal levels. This can keep you from getting sick. WHAT TYPES OF FAT SHOULD I CHOOSE?  Choose monosaturated and polyunsaturated fats. These are found in foods such as olive oil, canola oil, flaxseeds, walnuts, almonds, and seeds.  Eat more omega-3 fats. Good choices include salmon, mackerel, sardines, tuna, flaxseed oil, and ground flaxseeds.  Limit saturated fats. These are in animal products such as meats, butter, and cream. They can also be in plant products such as palm oil, palm kernel oil, and coconut oil.   Avoid foods with partially hydrogenated oils in them. These contain trans fats. Examples of foods that have trans fats are stick margarine, some tub margarines, cookies, crackers, and other baked goods. WHAT GENERAL GUIDELINES DO I NEED TO FOLLOW?   Check food labels. Look for the words "trans fat" and "saturated fat."  When preparing a meal:  Fill half of your plate with vegetables and green salads.  Fill one fourth of your plate with whole grains. Look for the word "whole" as the first word in the ingredient list.  Fill one fourth of your plate with lean protein foods.  Limit fruit  to two servings a day. Choose fruit instead of juice.  Eat more foods with soluble fiber. Examples of foods with this type of fiber are apples, broccoli, carrots, beans, peas, and barley. Try to get 20-30 g (grams) of fiber per day.  Eat more home-cooked foods. Eat less at restaurants and buffets.  Limit or avoid alcohol.  Limit foods high in starch and sugar.  Limit fried foods.  Cook foods without frying them. Baking, boiling, grilling, and broiling are all great options.  Lose weight if you are overweight. Losing even a small amount of weight can help your overall health. It can also help prevent diseases such as diabetes and heart disease. WHAT FOODS CAN I EAT? Grains Whole grains, such as whole wheat or whole grain breads, crackers, cereals, and pasta. Unsweetened oatmeal, bulgur, barley, quinoa, or brown rice. Corn or whole wheat flour tortillas. Vegetables Fresh or frozen vegetables (raw, steamed, roasted, or grilled). Green salads. Fruits All fresh, canned (in natural juice), or frozen fruits. Meat and Other Protein Products Ground beef (85% or leaner), grass-fed beef, or beef trimmed of fat. Skinless chicken  or [place]. Ground chicken or [place]. Pork trimmed of fat. All fish and seafood. Eggs. Dried beans, peas, or lentils. Unsalted nuts or seeds. Unsalted canned or dry beans. Dairy Low-fat dairy products, such as skim or 1% milk, 2% or reduced-fat cheeses, low-fat ricotta or cottage cheese, or plain low-fat yogurt. Fats and Oils Tub margarines without trans fats. Light or reduced-fat mayonnaise and salad dressings. Avocado. Olive, canola, sesame, or safflower oils. Natural peanut or almond butter (choose ones without added sugar and oil). The items listed above may not be a complete list of recommended foods or beverages. Contact your dietitian for more options. WHAT FOODS ARE NOT RECOMMENDED? Grains White bread. White pasta. White rice. Cornbread. Bagels, pastries, and  croissants. Crackers that contain trans fat. Vegetables White potatoes. Corn. Creamed or fried vegetables. Vegetables in a cheese sauce. Fruits Dried fruits. Canned fruit in light or heavy syrup. Fruit juice. Meat and Other Protein Products Fatty cuts of meat. Ribs, chicken wings, bacon, sausage, bologna, salami, chitterlings, fatback, hot dogs, bratwurst, and packaged luncheon meats. Liver and organ meats. Dairy Whole or 2% milk, cream, half-and-half, and cream cheese. Whole milk cheeses. Whole-fat or sweetened yogurt. Full-fat cheeses. Nondairy creamers and whipped toppings. Processed cheese, cheese spreads, or cheese curds. Sweets and Desserts Corn syrup, sugars, honey, and molasses. Candy. Jam and jelly. Syrup. Sweetened cereals. Cookies, pies, cakes, donuts, muffins, and ice cream. Fats and Oils Butter, stick margarine, lard, shortening, ghee, or bacon fat. Coconut, palm kernel, or palm oils. Beverages Alcohol. Sweetened drinks (such as sodas, lemonade, and fruit drinks or punches). The items listed above may not be a complete list of foods and beverages to avoid. Contact your dietitian for more information.   This information is not intended to replace advice given to you by your health care provider. Make sure you discuss any questions you have with your health care provider.   Document Released: 03/24/2012 Document Revised: 10/14/2014 Document Reviewed: 12/23/2013 Elsevier Interactive Patient Education 2016 [employer].   Echocardiogram An echocardiogram, or echocardiography, uses sound waves (ultrasound) to produce an image of your heart. The echocardiogram is simple, painless, obtained within a short period of time, and offers valuable information to your health care provider. The images from an echocardiogram can provide information such as:  Evidence of coronary artery disease (CAD).  Heart size.  Heart muscle function.  Heart valve function.  Aneurysm  detection.  Evidence of a past heart attack.  Fluid buildup around the heart.  Heart muscle thickening.  Assess heart valve function. LET [facility] CARE PROVIDER KNOW ABOUT:  Any allergies you have.  All medicines you are taking, including vitamins, herbs, eye drops, creams, and over-the-counter medicines.  Previous problems you or members of your family have had with the use of anesthetics.  Any blood disorders you have.  Previous surgeries you have had.  Medical conditions you have.  Possibility of pregnancy, if this applies. BEFORE THE PROCEDURE  No special preparation is needed. Eat and drink normally.  PROCEDURE   In order to produce an image of your heart, gel will be applied to your chest and a wand-like tool (transducer) will be moved over your chest. The gel will help transmit the sound waves from the transducer. The sound waves will harmlessly bounce off your heart to allow the heart images to be captured in real-time motion. These images will then be recorded.  You may need an IV to receive a medicine that improves the quality of the pictures. AFTER  THE PROCEDURE You may return to your normal schedule including diet, activities, and medicines, unless your health care provider tells you otherwise.   This information is not intended to replace advice given to you by your health care provider. Make sure you discuss any questions you have with your health care provider.   Document Released: 09/20/2000 Document Revised: 10/14/2014 Document Reviewed: 05/31/2013 Elsevier Interactive Patient Education 2016 [employer].   How to Take Your Blood Pressure HOW DO I GET A BLOOD PRESSURE MACHINE?  You can buy an electronic home blood pressure machine at your local pharmacy. Insurance will sometimes cover the cost if you have a prescription.  Ask your doctor what type of machine is best for you. There are different machines for your arm and your wrist.  If you decide to  buy a machine to check your blood pressure on your arm, first check the size of your arm so you can buy the right size cuff. To check the size of your arm:   Use a measuring tape that shows both inches and centimeters.   Wrap the measuring tape around the upper-middle part of your arm. You may need someone to help you measure.   Write down your arm measurement in both inches and centimeters.   To measure your blood pressure correctly, it is important to have the right size cuff.   If your arm is up to 13 inches (up to 34 centimeters), get an adult cuff size.  If your arm is 13 to 17 inches (35 to 44 centimeters), get a large adult cuff size.    If your arm is 17 to 20 inches (45 to 52 centimeters), get an adult thigh cuff.  WHAT DO THE NUMBERS MEAN?   There are two numbers that make up your blood pressure. For example: 120/80.  The first number (120 in our example) is called the "systolic pressure." It is a measure of the pressure in your blood vessels when your heart is pumping blood.  The second number (80 in our example) is called the "diastolic pressure." It is a measure of the pressure in your blood vessels when your heart is resting between beats.  Your doctor will tell you what your blood pressure should be. WHAT SHOULD I DO BEFORE I CHECK MY BLOOD PRESSURE?   Try to rest or relax for at least 30 minutes before you check your blood pressure.  Do not smoke.  Do not have any drinks with caffeine, such as:  Soda.  Coffee.  Tea.  Check your blood pressure in a quiet room.  Sit down and stretch out your arm on a table. Keep your arm at about the level of your heart. Let your arm relax.  Make sure that your legs are not crossed. HOW DO I CHECK MY BLOOD PRESSURE?  Follow the directions that came with your machine.  Make sure you remove any tight-fitting clothing from your arm or wrist. Wrap the cuff around your upper arm or wrist. You should be able to fit a  finger between the cuff and your arm. If you cannot fit a finger between the cuff and your arm, it is too tight and should be removed and rewrapped.  Some units require you to manually pump up the arm cuff.  Automatic units inflate the cuff when you press a button.  Cuff deflation is automatic in both models.  After the cuff is inflated, the unit measures your blood pressure and pulse. The readings  are shown on a monitor. Hold still and breathe normally while the cuff is inflated.  Getting a reading takes less than a minute.  Some models store readings in a memory. Some provide a printout of readings. If your machine does not store your readings, keep a written record.  Take readings with you to your next visit with your doctor.   This information is not intended to replace advice given to you by your health care provider. Make sure you discuss any questions you have with your health care provider.   Document Released: 09/05/2008 Document Revised: 10/14/2014 Document Reviewed: 11/18/2013 Elsevier Interactive Patient Education 2016 Elsevier Inc.   Blood Pressure Record Sheet Your blood pressure on this visit to the emergency department or clinic is elevated. This does not necessarily mean you have high blood pressure (hypertension), but it does mean that your blood pressure needs to be rechecked. Many times your blood pressure can increase due to illness, pain, anxiety, or other factors. We recommend that you get a series of blood pressure readings done over a period of 5 days. It is best to get a reading in the morning and one in the evening. You should make sure to sit and relax for 1-5 minutes before the reading is taken. Write the readings down and make a follow-up appointment with your health care provider to discuss the results. If there is not a free clinic or a drug store with a blood-pressure-taking machine near you, you can purchase blood-pressure-taking equipment from a drug  store. Having one in the home allows you the convenience of taking your blood pressure while you are home and relaxed.  Your blood pressure in the emergency department or clinic on ________ was ____________________. BLOOD PRESSURE LOG Date: _______________________  a.m. _____________________  p.m. _____________________ Date: _______________________  a.m. _____________________  p.m. _____________________ Date: _______________________  a.m. _____________________  p.m. _____________________ Date: _______________________  a.m. _____________________  p.m. _____________________ Date: _______________________  a.m. _____________________  p.m. _____________________   This information is not intended to replace advice given to you by your health care provider. Make sure you discuss any questions you have with your health care provider.   Document Released: 06/22/2003 Document Revised: 10/14/2014 Document Reviewed: 11/16/2013 Elsevier Interactive Patient Education 2016 [employer].   Physical Activity With Heart Disease Exercising has many benefits, even when you have heart disease. It can help control cholesterol and high blood pressure and improve sleep. It can make your bones and heart muscle stronger. If you exercise about 20-30 minutes per day, 5 times per week, you will be able to do more and feel healthier. Before starting an exercise program, talk with your health care provider about exercising. Your health care provider may recommend a physical exam before starting an exercise program. Your health care provider can match your fitness level with the right exercise program.  HOW CAN [place]? When exercise is done on a regular basis, there are many benefits. Exercise can:  Lower your blood pressure.  Lower your cholesterol.  Control your weight.  Improve your sleep.  Help control your blood sugars.  Improve your heart and lung  function.  Strengthen your bones and reduce bone loss.  Reduce your risk of blood clots (thrombophlebitis).  Lower your risk of certain cancers.  Improve your energy level.  Reduce stress.  Make you feel better about yourself. HOW DO I BEGIN A HEART-HEALTHY EXERCISE PROGRAM?  Talk with your health care provider about how often  to exercise and what types of exercise would be good for you. Ask your health care provider if:  You should engage in moderate or vigorous cardiovascular exercise.  There are any exercises you should avoid.  Your medicines should be taken at a certain time.  You should check your pulse or take other precautions during exercise.  Get a calendar. Write down a schedule and plan for your exercise routine.  Consider joining a community exercise program, such as a biking group, yoga class, or swimming pool membership. You can also join a local gym. You can find free workout applications on some phones or devices, or purchase workout DVDs and exercise on your own. Find out what works for you.  After checking with your health care provider about approved activities, try to incorporate a variety of activities into your plan. For instance, you may do a yoga class Tuesdays and Thursdays, walk or climb stairs Mondays and Wednesdays, and then lift small weights while you are watching your favorite show on Sunday night.  If you have not been exercising, begin with sessions that last 10 to 15 minutes. Gradually work up to sessions that last 20 to 30 minutes. Try to exercise 5 times per week. Follow all of your health care provider's recommendations.  Be patient with yourself. It takes time to build up strength and lung capacity. WHAT ARE SOME TYPES OF EXERCISES I COULD TRY?  Cardiovascular exercise gets your heart and lungs working. Depending on your speed and intensity, a cardiovascular activity can be moderate or can become vigorous. Watch your pace and follow your health  care provider's recommendations about the intensity of your workouts. Moderate cardiovascular exercise includes:  Walking.  Slow bicycling.  Water aerobics.  Doubles tennis.  Ballroom dancing.  Light gardening or yard work. Vigorous cardiovascular exercise includes:  Jogging or running.  Jumping rope.  Singles tennis.  Stair climbing.  Swimming laps.  Cross-country skiing.  Hiking uphill.  Heavy gardening, such as digging trenches. Strengthening exercises tense your muscles to build strength. Some examples include:  Doing push-ups and abdominal work.  Lifting small weights.  Using resistance bands.  Doing exercises with a medicine ball.  Doing pull-ups on a pull-up bar. Flexibility exercises lengthen your muscles to keep them flexible and less tight and improve balance. Some examples include:  Stretching.  Yoga.  Tai chi.  [place] barre. WHAT OTHER THINGS WILL HELP ME BE SUCCESSFUL?  If you do not enjoy an activity, try a new one. Keep doing new things until you find exercises that you like.  Drink plenty of water before, during, and after exercise.  Eat meals about 2 hours before you exercise. If you need to snack right before, eat fruit such as a banana or apple.  Wear clothing that is comfortable, fits well, and is appropriate for the weather. Also be sure to wear supportive shoes.  Warm up or stretch for 5 minutes before exercise. Slowly decrease your activity or cool down for 5 minutes after exercise. This can help prevent injury. Ask your health care provider for more information.   Always exercise within your abilities and be aware of what your body is able to handle.  If you belong to a gym, ask the staff to help you learn how to use equipment and exercise machines. Ask a physical therapist or trainer about proper form and techniques to prevent injury. ARE THERE ANY PRECAUTIONS I MIGHT NEED TO TAKE?  Depending on your condition, you may need  to work closely with your health care provider or specialist to come up with an approved exercise program. Follow your health care provider's instructions for recovery, cardiac rehabilitation, and a long-term exercise plan.  Because of your heart condition, you are more sensitive to weather and environmental changes and need to be cautious. If there are extreme outdoor conditions, such as heat, humidity, or cold, you may need to exercise indoors. If chemicals or other pollutants in the air reach an unsafe level and there is an air pollution advisory, you may need to exercise indoors. Your local news, board of health, or hospital can provide information on air quality. Exercise in an indoor, climate-controlled facility if these conditions exist.  Monitor your pulse if directed by your health care provider.  If you feel tired, or have any heart symptoms, such as shortness of breath, dizziness, irregular heartbeat, or chest pains, stop exercising and call your health care provider or 911.  If you have certain types of heart disease, you may need to take extra precautions. These may include:  Avoiding heavy lifting.  Avoiding strenuous activities.  Making sure you include a cool-down period to give your body time to adjust.  Understanding how your medicines can affect you during exercise. Certain medicines may cause heat intolerance and changes in blood sugar.  Knowing your regular symptoms. Slow down and rest when you need to. Call your health care provider if they happen more, last longer, or feel stronger.  Keeping nitroglycerin spray and tabletswith you at all times if you have angina. Use them as directed to prevent and treat symptoms. WHEN SHOULD I SEE [place] PROVIDER?   You have chest pain, shortness of breath, or feel very tired.  You have pain in the arm, shoulder, neck, or jaw.  You feel weak, dizzy, or light-headed.   You have an irregular heart rate or your heart rate  is greater than 100 beats per minute (bpm) before exercise.   This information is not intended to replace advice given to you by your health care provider. Make sure you discuss any questions you have with your health care provider.   Document Released: 04/20/2014 Document Revised: 02/07/2015 Document Reviewed: 04/20/2014 Elsevier Interactive Patient Education 2016 Elsevier Inc.   CPAP and BIPAP Information CPAP and BIPAP are methods of helping you breathe with the use of air pressure. CPAP stands for "continuous positive airway pressure." BIPAP stands for "bi-level positive airway pressure." In both methods, air is blown into your air passages to help keep you breathing well. With CPAP, the amount of pressure stays the same while you breathe in and out. CPAP is most commonly used for obstructive sleep apnea. For obstructive sleep apnea, CPAP works by holding your airways open so that they do not collapse when your muscles relax during sleep. BIPAP is similar to CPAP except the amount of pressure is increased when you inhale. This helps you take larger breaths. Your health care provider will recommend whether CPAP or BIPAP would be more helpful for you.  WHY ARE CPAP AND BIPAP TREATMENTS USED? CPAP or BIPAP can be helpful if you have:   Sleep apnea.   Chronic obstructive pulmonary disease (COPD).   Diseases that weaken the muscles of the chest, including muscular dystrophy or neurological diseases such as amyotrophic lateral sclerosis (ALS).  Other problems that cause breathing to be weak, abnormal, or difficult.  HOW IS CPAP OR BIPAP ADMINISTERED? Both CPAP and BIPAP are provided by a small machine  with a flexible plastic tube that attaches to a plastic mask. The mask fits on your face, and air is blown into your air passages through your nose or mouth. The amount of pressure that is used to blow the air into your air passages can be set on the machine. Your health care provider will  determine the pressure setting that should be used based on your individual needs. WHEN SHOULD CPAP OR BIPAP BE USED? In most cases, the mask is worn only when sleeping. Generally, you will need to wear the mask throughout the night and during the daytime if you take a nap. In a few cases involving certain medical conditions, people also need to wear the mask at other times when they are awake. Follow your health care provider's instructions for when to use the machine.  USING THE MASK  Because the mask needs to be snug, some people feel a trapped or closed-in feeling (claustrophobic) when first using the mask. You may need to get used to the mask gradually. To do this, you can first hold the mask loosely over your nose or mouth. Gradually apply the mask more snugly. You can also gradually increase the amount of time that you use the mask.  Masks are available in various types and sizes. Some fit over your mouth and nose, and some fit over just your nose. If your mask does not fit well, talk to your health care provider about getting a different one.  If you are using a nasal mask and you tend to breathe through your mouth, a chin strap may be applied to help keep your mouth closed.   The CPAP and BIPAP machines have alarms that may sound if the mask comes off or develops a leak.  If you have trouble with the mask, it is very important that you talk to your health care provider about finding a way to make the mask easier to tolerate. Do not stop using the mask. This could have a negative impact on your health. TIPS FOR USING THE MACHINE  Place your CPAP or BIPAP machine on a secure table or stand near an electrical outlet.   Know where the on-off switch is located on the machine.  Follow your health care provider's instructions for how to set the pressure on your machine and when you should use it.  Do not eat or drink while the CPAP or BIPAP machine is on. Food or fluids could get pushed  into your lungs by the pressure of the CPAP or BIPAP.  Do not smoke. Tobacco smoke residue can damage the machine.   For home use, CPAP and BIPAP machines can be rented or purchased through home health care companies. Many different brands of machines are available. Renting a machine before purchasing may help you find out which particular machine works well for you. SEEK IMMEDIATE MEDICAL CARE IF:  You have redness or open areas around your nose or mouth where the mask fits.   You have trouble operating the CPAP or BIPAP machine.   You cannot tolerate wearing the CPAP or BIPAP mask.    This information is not intended to replace advice given to you by your health care provider. Make sure you discuss any questions you have with your health care provider.   Document Released: 06/21/2004 Document Revised: 10/14/2014 Document Reviewed: 04/22/2013 Elsevier Interactive Patient Education 2016 [place] Profile Test WHY AM I HAVING THIS TEST? The lipid profile test gives  results that can help predict the likelihood of developing heart disease. The test is also used to monitor treatment for high cholesterol to see if you are reaching your goals. A lipid profile measures the following:  Total cholesterol. Cholesterol is a waxy fat in your blood. If your total cholesterol is elevated, this can increase your risk of coronary heart disease.  High-density lipoprotein (HDL). This is known as the good cholesterol. Having a high level of HDL is good. Your HDL level may be low if you smoke or do not get enough exercise.  Low-density lipoprotein (LDL). This is known as the bad cholesterol and is responsible for the formation of plaque in the arteries. Having a low level of LDL is best.  Cholesterol to HDL ratio. This is calculated by dividing the total cholesterol by the HDL cholesterol. The ratio is used by health care providers for determining your risk of heart disease. A low ratio  is best.  Triglycerides. These are a type of fat in the blood responsible for providing energy to your cells. Low levels are best. WHAT KIND OF SAMPLE IS TAKEN? A blood sample is required for this test. It is usually collected by inserting a needle into a vein. HOW DO I PREPARE FOR THE TEST?  Do not eat or drink anything after midnight on the night before the test or as directed by your health care provider. WHAT ARE THE REFERENCE RANGES? Reference ranges are considered healthy ranges established after testing a large group of healthy people. Reference ranges may vary among different people, labs, and hospitals. It is your responsibility to obtain your test results. Ask the lab or department performing the test when and how you will get your results. Reference ranges for the lipid profile test are as follows: Total Cholesterol  Adult or elderly: less than 200 mg/dL or less than 5.20 mmol/L (SI units).  Child: 120-200 mg/dL.  Infant: 70-175 mg/dL.  Newborns: 53-135 mg/dL. HDL  Male: greater than 45 mg/dL or greater than 0.75 mmol/L (SI units).  Male: greater than 55 mg/dL or greater than 0.91 mmol/L (SI units). HDL reference values based on risk of heart disease:  For low risk of heart disease:  Male: 60 mg/dL or 1.55 mmol/L.  Male: 70 mg/dL or 1.81 mmol/L.  For moderate risk of heart disease:  Male: 45 mg/dL or 1.17 mmol/L.  Male: 55 mg/dL or 1.42 mmol/L.  For high risk of heart disease:  Male: 25 mg/dL or 0.65 mmol/L.  Male: 35 mg/dL or 0.90 mmol/L. LDL  Adult: less than 130 mg/dL.  Children: less than 110 mg/dL. Cholesterol to HDL Ratio Reference values based on risk for coronary heart disease:  Risk that is one half average:  Male: 3.4.  Male: 3.3.  Average risk:  Male: 5.0.  Male: 4.4.  Risk that is two times average (moderate risk):  Male: 10.0.  Male: 7.0.  Risk that is three times average (high risk):  Male: 24.0.  Male:  11.0. Triglycerides  Adult or elderly:  Male: 40-160 mg/dL or 0.45-1.81 mmol/L (SI units).  Male: 35-135 mg/dL or 0.40-1.52 mmol/L (SI units).  Children 76-2 years old:  Male: 30-86 mg/dL.  Male: 32-99 mg/dL.  Children 55-54 years old:  Male: 31-108 mg/dL.  Male: 35-114 mg/dL.  Children 21-29 years old:  Male: 36-138 mg/dL.  Male: 41-138 mg/dL.  Children 44-72 years old:  Male: 40-163 mg/dL.  Male: 40-128 mg/dL. Triglycerides should be less than 400 mg/dL even when you are not  fasting.  WHAT DO THE RESULTS MEAN?  Talk with your health care provider to discuss your results, treatment options, and if necessary, the need for more tests. Talk with your health care provider if you have any questions about your results.   This information is not intended to replace advice given to you by your health care provider. Make sure you discuss any questions you have with your health care provider.   Document Released: 10/17/2004 Document Revised: 10/14/2014 Document Reviewed: 01/13/2014 Elsevier Interactive Patient Education [employer].

## 2016-07-04 LAB — TSH: TSH: 2.08 u[IU]/mL (ref 0.450–4.500)

## 2016-07-04 LAB — T4, FREE: Free T4: 1.04 ng/dL (ref 0.82–1.77)

## 2016-07-05 ENCOUNTER — Other Ambulatory Visit: Payer: Self-pay

## 2016-07-05 ENCOUNTER — Ambulatory Visit (INDEPENDENT_AMBULATORY_CARE_PROVIDER_SITE_OTHER): Payer: BLUE CROSS/BLUE SHIELD

## 2016-07-05 DIAGNOSIS — E785 Hyperlipidemia, unspecified: Secondary | ICD-10-CM | POA: Diagnosis not present

## 2016-07-05 DIAGNOSIS — R5382 Chronic fatigue, unspecified: Secondary | ICD-10-CM

## 2016-07-05 DIAGNOSIS — I2510 Atherosclerotic heart disease of native coronary artery without angina pectoris: Secondary | ICD-10-CM | POA: Diagnosis not present

## 2016-07-05 DIAGNOSIS — I10 Essential (primary) hypertension: Secondary | ICD-10-CM | POA: Diagnosis not present

## 2016-07-05 DIAGNOSIS — I159 Secondary hypertension, unspecified: Secondary | ICD-10-CM

## 2016-07-05 DIAGNOSIS — F17200 Nicotine dependence, unspecified, uncomplicated: Secondary | ICD-10-CM

## 2016-07-05 LAB — ECHOCARDIOGRAM COMPLETE
AOPV: 0.68 m/s
AOVTI: 26.8 cm
AV Area VTI index: 1.02 cm2/m2
AV Area VTI: 2.36 cm2
AV Peak grad: 8 mmHg
AV peak Index: 1.06
AV vel: 2.27
AVAREAMEANV: 2.21 cm2
AVAREAMEANVIN: 0.99 cm2/m2
AVCELMEANRAT: 0.64
AVG: 4 mmHg
AVPKVEL: 142 cm/s
CHL CUP AV VALUE AREA INDEX: 1.02
CHL CUP REG VEL DIAS: 85.5 cm/s
DOP CAL AO MEAN VELOCITY: 96.7 cm/s
FS: 39 % (ref 28–44)
IVS/LV PW RATIO, ED: 1
LA diam index: 1.52 cm/m2
LA vol: 59.3 mL
LASIZE: 34 mm
LAVOLA4C: 50.5 mL
LAVOLIN: 26.6 mL/m2
LDCA: 3.46 cm2
LEFT ATRIUM END SYS DIAM: 34 mm
LV PW d: 11.9 mm — AB (ref 0.6–1.1)
LV TDI E'LATERAL: 15.8
LV TDI E'MEDIAL: 9.03
LV e' LATERAL: 15.8 cm/s
LVOT SV: 61 mL
LVOT VTI: 17.6 cm
LVOT peak vel: 96.8 cm/s
LVOTD: 21 mm
LVOTVTI: 0.66 cm
Reg peak vel: 238 cm/s
TAPSE: 20.4 mm
TR max vel: 238 cm/s
Valve area: 2.27 cm2

## 2016-10-02 ENCOUNTER — Other Ambulatory Visit: Payer: BLUE CROSS/BLUE SHIELD

## 2016-10-29 ENCOUNTER — Emergency Department: Payer: BLUE CROSS/BLUE SHIELD

## 2016-10-29 ENCOUNTER — Encounter: Payer: Self-pay | Admitting: Emergency Medicine

## 2016-10-29 ENCOUNTER — Emergency Department
Admission: EM | Admit: 2016-10-29 | Discharge: 2016-10-29 | Disposition: A | Discharge status: Home / Self Care | Payer: BLUE CROSS/BLUE SHIELD | Attending: Emergency Medicine | Admitting: Emergency Medicine

## 2016-10-29 DIAGNOSIS — J111 Influenza due to unidentified influenza virus with other respiratory manifestations: Secondary | ICD-10-CM | POA: Insufficient documentation

## 2016-10-29 DIAGNOSIS — J189 Pneumonia, unspecified organism: Secondary | ICD-10-CM

## 2016-10-29 DIAGNOSIS — F17200 Nicotine dependence, unspecified, uncomplicated: Secondary | ICD-10-CM | POA: Insufficient documentation

## 2016-10-29 DIAGNOSIS — J181 Lobar pneumonia, unspecified organism: Secondary | ICD-10-CM | POA: Diagnosis not present

## 2016-10-29 DIAGNOSIS — I10 Essential (primary) hypertension: Secondary | ICD-10-CM | POA: Diagnosis not present

## 2016-10-29 DIAGNOSIS — J101 Influenza due to other identified influenza virus with other respiratory manifestations: Secondary | ICD-10-CM

## 2016-10-29 DIAGNOSIS — R05 Cough: Secondary | ICD-10-CM | POA: Diagnosis present

## 2016-10-29 LAB — INFLUENZA PANEL BY PCR (TYPE A & B)
Influenza A By PCR: POSITIVE — AB
Influenza B By PCR: NEGATIVE

## 2016-10-29 MED ORDER — HYDROCOD POLST-CPM POLST ER 10-8 MG/5ML PO SUER: 5.0000 mL | Freq: Two times a day (BID) | ORAL | 0 refills | Status: AC | PRN

## 2016-10-29 MED ORDER — SODIUM CHLORIDE 0.9 % IV BOLUS (SEPSIS)
1000.0000 mL | Freq: Once | INTRAVENOUS | Status: AC
Start: 2016-10-29 — End: 2016-10-29
  Administered 2016-10-29: 1000 mL via INTRAVENOUS

## 2016-10-29 MED ORDER — NAPROXEN 500 MG PO TBEC: 500.0000 mg | DELAYED_RELEASE_TABLET | Freq: Two times a day (BID) | ORAL | 0 refills | Status: AC

## 2016-10-29 MED ORDER — AZITHROMYCIN 250 MG PO TABS: ORAL_TABLET | ORAL | 0 refills | Status: AC

## 2016-10-29 MED ORDER — ACETAMINOPHEN 500 MG PO TABS
1000.0000 mg | ORAL_TABLET | Freq: Once | ORAL | Status: AC
  Administered 2016-10-29: 1000 mg via ORAL
  Filled 2016-10-29: qty 2

## 2016-10-29 MED ORDER — BENZONATATE 100 MG PO CAPS
200.0000 mg | ORAL_CAPSULE | Freq: Once | ORAL | Status: AC
  Administered 2016-10-29: 200 mg via ORAL
  Filled 2016-10-29: qty 2

## 2016-10-29 NOTE — ED Triage Notes (Signed)
Fever, cough for 2 days.  Says uncontrollable coughing all night.

## 2016-10-29 NOTE — ED Notes (Signed)
Pt discharged home after verbalizing understanding of discharge instructions; nad noted. 

## 2016-10-29 NOTE — ED Notes (Signed)
See triage note states he developed sx's about 4 days ago  Fever was 102 last pm  Non prod cough  Fever and body aches

## 2016-10-29 NOTE — ED Provider Notes (Signed)
[facility] [place] Emergency Department Provider Note  ____________________________________________  Time seen: Approximately 3:20 PM  I have reviewed the triage vital signs and the nursing notes.   HISTORY  Chief Complaint Cough and Fever    HPI Omar Turner is a 46 y.o. male presenting to the emergency department with 5 days of fever, congestion, rhinorrhea, nonproductive cough, headache, malaise and diarrhea. Patient states that symptoms started after his son came down with similar symptoms. Patient states that he has diminished appetite and is drinking less than usual. He is sleeping more and has less energy. He has taken Tylenol but has attempted no other alleviating measures. He denies chest pain, chest tightness, abdominal pain, nausea, vomiting, dysuria, and tea colored urine.    Past Medical History:  Diagnosis Date  . Anginal pain ([place])   . Degenerative disc disease, cervical   . GERD (gastroesophageal reflux disease)   . Hypertension   . Sleep apnea     Patient Active Problem List   Diagnosis Date Noted  . Precordial pain     Past Surgical History:  Procedure Laterality Date  . APPENDECTOMY    . CARDIAC CATHETERIZATION Left 06/17/2016   Procedure: Left Heart Cath and Coronary Angiography;  Surgeon: Wellington Hampshire, MD;  Location: [facility];  Service: Cardiovascular;  Laterality: Left;  . COLONOSCOPY    . COLONOSCOPY WITH PROPOFOL N/A 09/07/2015   Procedure: COLONOSCOPY WITH PROPOFOL;  Surgeon: Hulen Luster, MD;  Location: [facility] ENDOSCOPY;  Service: Gastroenterology;  Laterality: N/A;  . HERNIA REPAIR      Prior to Admission medications   Medication Sig Start Date End Date Taking? Authorizing Provider  amLODipine (NORVASC) 5 MG tablet Take 5 mg by mouth daily.    Historical Provider, MD  aspirin EC 81 MG tablet Take 1 tablet (81 mg total) by mouth daily. 06/12/16   Wende Bushy, MD  atorvastatin (LIPITOR) 40 MG tablet Take 2 tablets (80  mg total) by mouth daily. 06/17/16 09/15/16  Wende Bushy, MD  azithromycin (ZITHROMAX Z-PAK) 250 MG tablet Take 2 tablets (500 mg) on  Day 1,  followed by 1 tablet (250 mg) once daily on Days 2 through 5. 10/29/16 11/03/16  Lannie Fields, PA-C  chlorpheniramine-HYDROcodone (TUSSIONEX PENNKINETIC ER) 10-8 MG/5ML SUER Take 5 mLs by mouth every 12 (twelve) hours as needed for cough. 10/29/16 11/03/16  Lannie Fields, PA-C  loratadine (ALLERGY RELIEF) 10 MG tablet Take 10 mg by mouth daily as needed for allergies.    Historical Provider, MD  metoprolol succinate (TOPROL XL) 25 MG 24 hr tablet Take 1 tablet (25 mg total) by mouth daily. 07/03/16   Wende Bushy, MD  naproxen (EC NAPROSYN) 500 MG EC tablet Take 1 tablet (500 mg total) by mouth 2 (two) times daily with a meal. 10/29/16 11/03/16  Lannie Fields, PA-C  nitroGLYCERIN (NITROSTAT) 0.4 MG SL tablet Place 1 tablet (0.4 mg total) under the tongue every 5 (five) minutes as needed. 06/12/16   Wende Bushy, MD  pantoprazole (PROTONIX) 40 MG tablet Take 40 mg by mouth daily.    Historical Provider, MD    Allergies Chantix [varenicline]; Hydrocodone-homatropine; and Lexapro [escitalopram]  Family History  Problem Relation Age of Onset  . Hypertension Father   . Heart disease Father     CABG x 3 & valve replacement.   [name] Heart attack Father     Social History Social History  Substance Use Topics  . Smoking status: Current  Every Day Smoker    Packs/day: 1.00    Years: 15.00  . Smokeless tobacco: Never Used  . Alcohol use 1.2 oz/week    2 Shots of liquor per week     Review of Systems  Constitutional: Patient has had fever.  Eyes: No visual changes. No discharge ENT: Patient has had congestion.  Cardiovascular: no chest pain. Respiratory: Patient has had non-productive cough.  No SOB. Gastrointestinal: Patient has had nausea and diarrhea. Genitourinary: Negative for dysuria. No hematuria Musculoskeletal: Patient has had myalgias. Skin:  Negative for rash, abrasions, lacerations, ecchymosis. Neurological: Patient has had headache, no focal weakness or numbness. ___________________________________________   PHYSICAL EXAM:  VITAL SIGNS: ED Triage Vitals [10/29/16 1357]  Enc Vitals Group     BP (!) 148/95     Pulse Rate (!) 127     Resp 16     Temp (!) 101.9 F (38.8 C)     Temp Source Oral     SpO2 95 %     Weight 236 lb (107 kg)     Height 5\' 11"  (1.803 m)     Head Circumference      Peak Flow      Pain Score 9     Pain Loc      Pain Edu?      Excl. in [place]?     Constitutional: Alert and oriented. Patient is lying supine in bed.  Eyes: Conjunctivae are normal. PERRL. EOMI. Head: Atraumatic. ENT:      Ears: Tympanic membranes are injected bilaterally without evidence of effusion or purulent exudate. Bony landmarks are visualized bilaterally. No pain with palpation at the tragus.      Nose: Nasal turbinates are edematous and erythematous. Copious rhinorrhea visualized.      Mouth/Throat: Mucous membranes are moist. Posterior pharynx is mildly erythematous. No tonsillar hypertrophy or purulent exudate. Uvula is midline. Neck: Full range of motion. No pain is elicited with flexion at the neck. Hematological/Lymphatic/Immunilogical: No cervical lymphadenopathy. Cardiovascular: Mildly tachycardic, regular rhythm. Normal S1 and S2.  Good peripheral circulation. Respiratory: Normal respiratory effort without tachypnea or retractions. Lungs CTAB. Good air entry to the bases with no decreased or absent breath sounds. Gastrointestinal: Bowel sounds 4 quadrants. Soft and nontender to palpation. No guarding or rigidity. No palpable masses. No distention. No CVA tenderness.  Skin:  Skin is warm, dry and intact. No rash noted. Psychiatric: Mood and affect are normal. Speech and behavior are normal. Patient exhibits appropriate insight and judgement.   ____________________________________________   LABS (all labs ordered  are listed, but only abnormal results are displayed)  Labs Reviewed  INFLUENZA PANEL BY PCR (TYPE A & B) - Abnormal; Notable for the following:       Result Value   Influenza A By PCR POSITIVE (*)    All other components within normal limits   ____________________________________________  EKG   ____________________________________________  RADIOLOGY Unk Pinto, personally viewed and evaluated these images (plain radiographs) as part of my medical decision making, as well as reviewing the written report by the radiologist.   Dg Chest 2 View  Result Date: 10/29/2016 CLINICAL DATA:  Fever and cough. EXAM: CHEST  2 VIEW COMPARISON:  CT 06/07/2016. FINDINGS: Mediastinum hilar structures are normal. Low lung volumes. Mild right base infiltrate. No pleural effusion or pneumothorax. Heart size normal. No acute bony abnormality . IMPRESSION: Low lung volumes.  Mild right base infiltrate. Electronically Signed   By: Marcello Moores  Register   On:  10/29/2016 15:57    ____________________________________________    PROCEDURES  Procedure(s) performed:    Procedures    Medications  acetaminophen (TYLENOL) tablet 1,000 mg (1,000 mg Oral Given 10/29/16 1446)  sodium chloride 0.9 % bolus 1,000 mL (0 mLs Intravenous Stopped 10/29/16 1647)  benzonatate (TESSALON) capsule 200 mg (200 mg Oral Given 10/29/16 1546)     ____________________________________________   INITIAL IMPRESSION / ASSESSMENT AND PLAN / ED COURSE  Pertinent labs & imaging results that were available during my care of the patient were reviewed by me and considered in my medical decision making (see chart for details).  Review of the [place] CSRS was performed in accordance of the [place] prior to dispensing any controlled drugs.     Assessment and Plan:  Influenza: Patient presents to the emergency department with 5 days of fever, congestion, rhinorrhea, nonproductive cough, headache, malaise and diarrhea. Patient tested  positive for Influenza A in the emergency department. Patient received IV fluids in the emergency department. DG chest reveals a right lower lobe infiltrate. Patient was discharged with azithromycin. Patient was also discharged with naproxen and Tussionex for cough. Patient was advised to follow-up with his primary care provider in one week. Physical exam is reassuring at this time. Strict return precautions were given. All patient questions were answered.  ____________________________________________  FINAL CLINICAL IMPRESSION(S) / ED DIAGNOSES  Final diagnoses:  Influenza A  Community acquired pneumonia of right lower lobe of lung ([place])      NEW MEDICATIONS STARTED DURING THIS VISIT:  Discharge Medication List as of 10/29/2016  4:39 PM    START taking these medications   Details  azithromycin (ZITHROMAX Z-PAK) 250 MG tablet Take 2 tablets (500 mg) on  Day 1,  followed by 1 tablet (250 mg) once daily on Days 2 through 5., Print            This chart was dictated using voice recognition software/Dragon. Despite best efforts to proofread, errors can occur which can change the meaning. Any change was purely unintentional.    Lannie Fields, PA-C 10/29/16 2248    Orbie Pyo, MD 10/29/16 [phone]

## 2016-11-12 ENCOUNTER — Other Ambulatory Visit: Payer: Self-pay | Admitting: Cardiology

## 2016-12-12 ENCOUNTER — Telehealth: Payer: Self-pay | Admitting: Cardiology

## 2016-12-12 NOTE — Telephone Encounter (Signed)
3 attempts to schedule fu from recall list Deleting 3 month f/u per checkout 07/03/2016 recall

## 2017-04-03 DIAGNOSIS — E7849 Other hyperlipidemia: Secondary | ICD-10-CM | POA: Insufficient documentation

## 2017-04-03 DIAGNOSIS — Z87898 Personal history of other specified conditions: Secondary | ICD-10-CM | POA: Insufficient documentation

## 2017-04-03 DIAGNOSIS — K219 Gastro-esophageal reflux disease without esophagitis: Secondary | ICD-10-CM | POA: Insufficient documentation

## 2017-04-10 ENCOUNTER — Other Ambulatory Visit: Payer: Self-pay

## 2017-04-10 MED ORDER — METOPROLOL SUCCINATE ER 25 MG PO TB24
25.0000 mg | ORAL_TABLET | Freq: Every day | ORAL | 3 refills | Status: DC
Start: 2017-04-10 — End: 2018-03-23

## 2017-04-10 NOTE — Telephone Encounter (Signed)
Would like a refill for Metoprolol succ ER 25 mg sent to [facility].

## 2017-05-28 ENCOUNTER — Other Ambulatory Visit: Payer: Self-pay | Admitting: Internal Medicine

## 2017-05-28 DIAGNOSIS — R748 Abnormal levels of other serum enzymes: Secondary | ICD-10-CM

## 2017-06-06 ENCOUNTER — Ambulatory Visit
Admission: RE | Admit: 2017-06-06 | Discharge: 2017-06-06 | Disposition: A | Discharge status: Home / Self Care | Payer: BLUE CROSS/BLUE SHIELD | Source: Ambulatory Visit | Attending: Internal Medicine | Admitting: Internal Medicine

## 2017-06-06 DIAGNOSIS — R748 Abnormal levels of other serum enzymes: Secondary | ICD-10-CM | POA: Diagnosis not present

## 2017-08-13 ENCOUNTER — Other Ambulatory Visit: Payer: Self-pay | Admitting: Cardiovascular Disease

## 2017-12-15 IMAGING — CT CT ANGIO CHEST
2 of 6 series · 19 of 46 positions shown · IV contrast (APPLIED)
Comparison: None.

CLINICAL DATA: Acute onset shortness of breath several hours ago
today.

EXAM:
CT ANGIOGRAPHY CHEST WITH CONTRAST
TECHNIQUE: Multidetector CT imaging of the chest was performed using the
standard protocol during bolus administration of intravenous
contrast. Multiplanar CT image reconstructions and MIPs were
obtained to evaluate the vascular anatomy.
CONTRAST:  75 mL Isovue 70

[Series 5: thins · axial · 0.88mm/px · z∈[-693,-384]mm · 17 of 339 slices shown]
[im 15/339  lung]
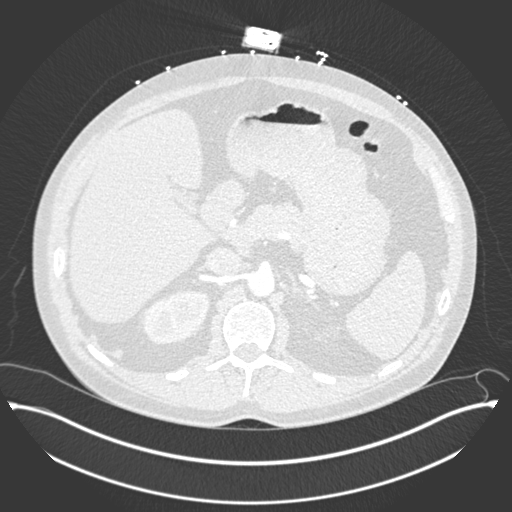
[im 30/339  soft-tissue]
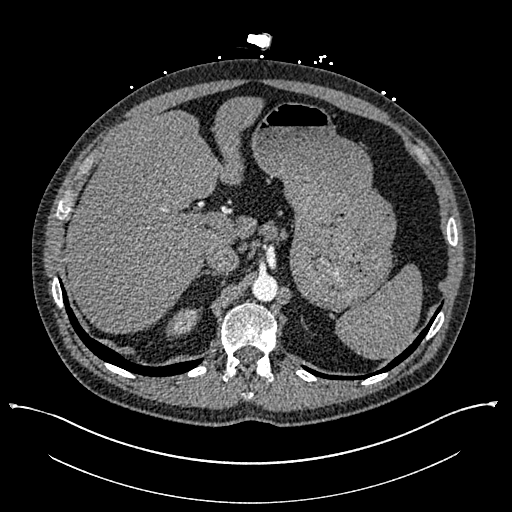
[im 59/339  lung]
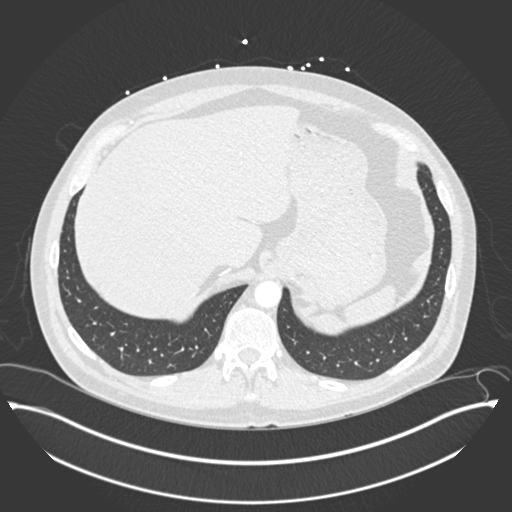
[im 74/339  soft-tissue]
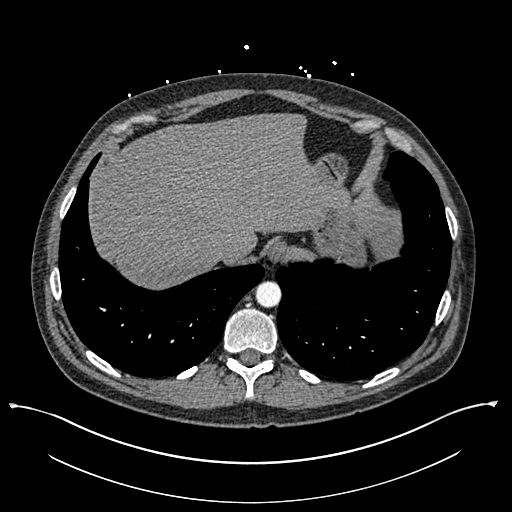
[im 89/339  lung]
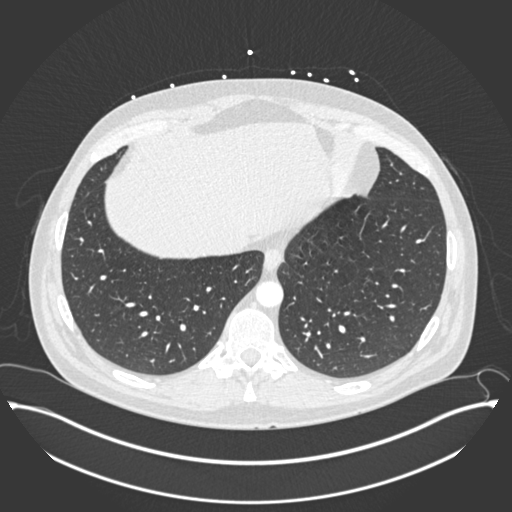
[im 118/339  soft-tissue]
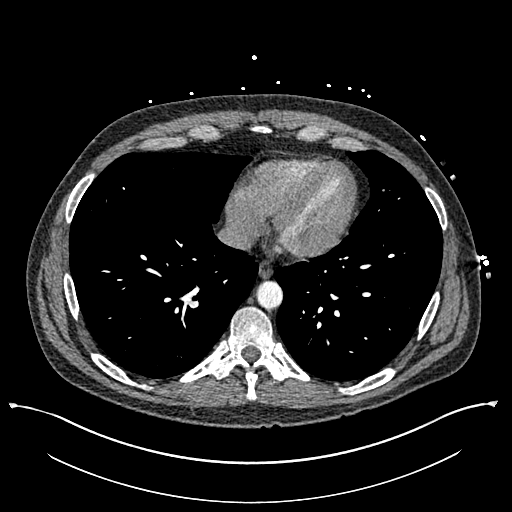
[im 133/339  lung]
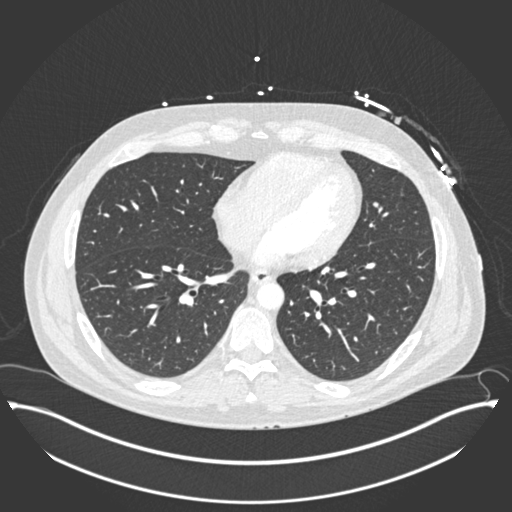
[im 147/339  soft-tissue]
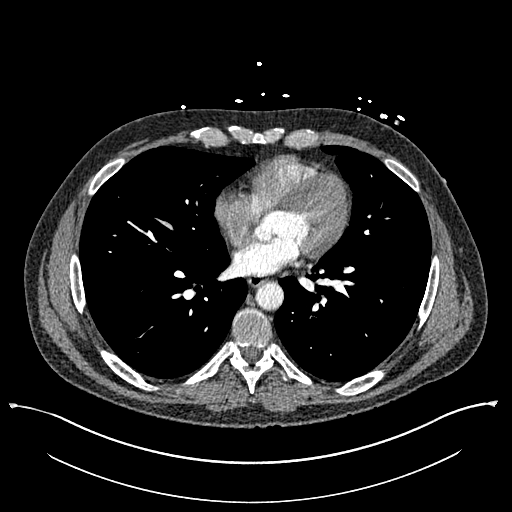
[im 177/339  lung]
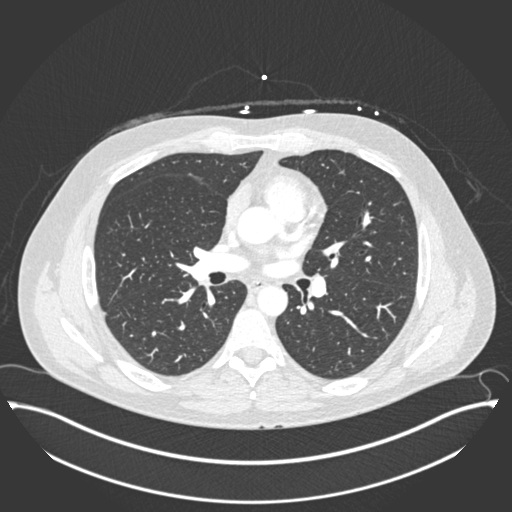
[im 192/339  soft-tissue]
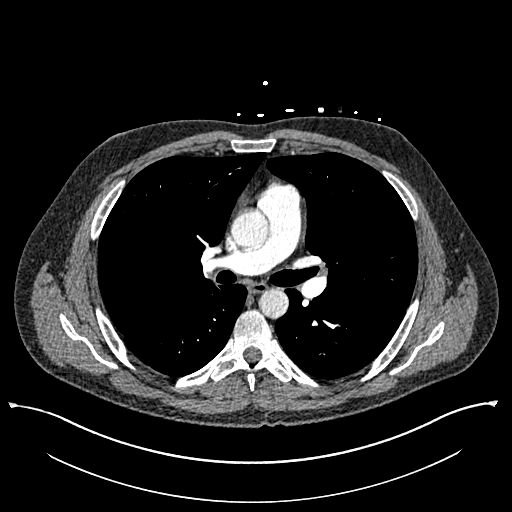
[im 206/339  lung]
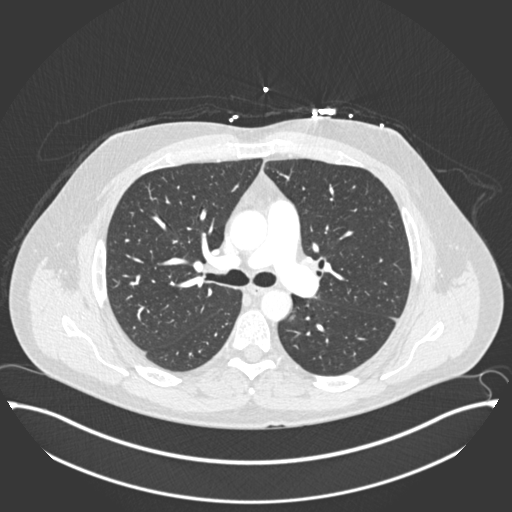
[im 221/339  soft-tissue]
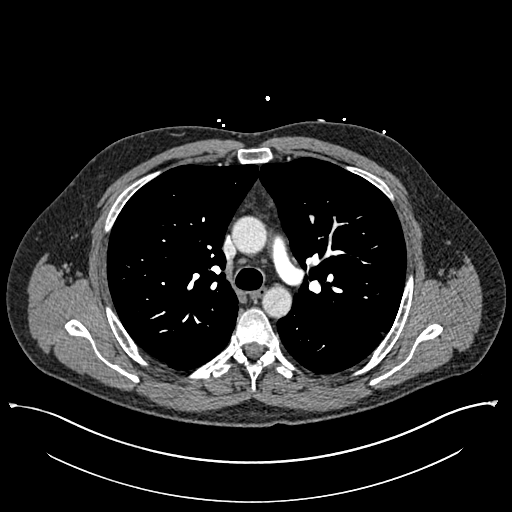
[im 250/339  lung]
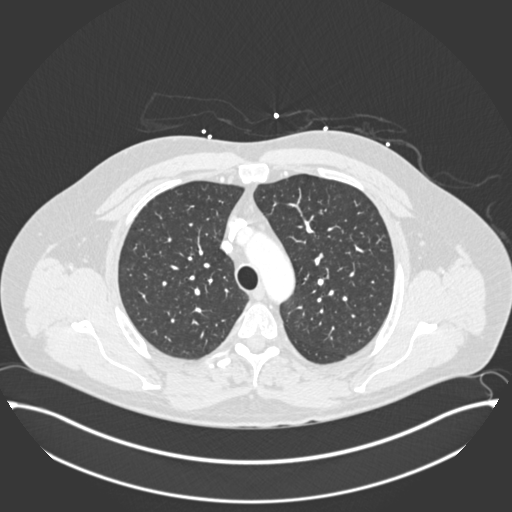
[im 265/339  soft-tissue]
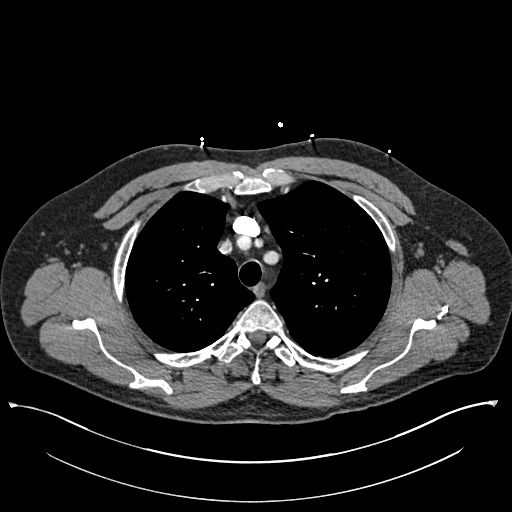
[im 280/339  lung]
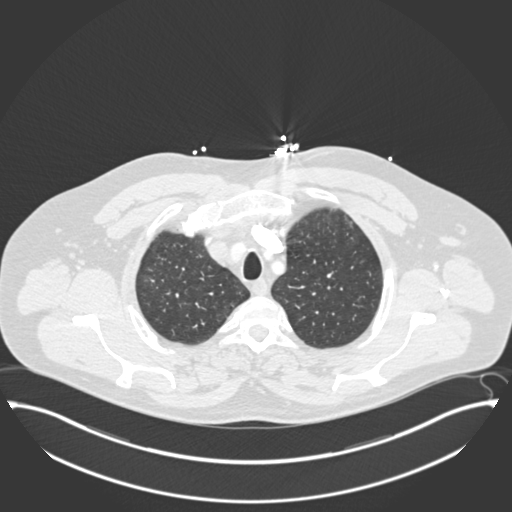
[im 309/339  soft-tissue]
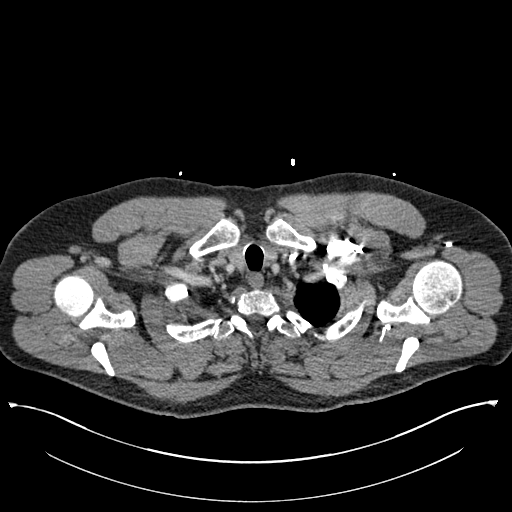
[im 324/339  lung]
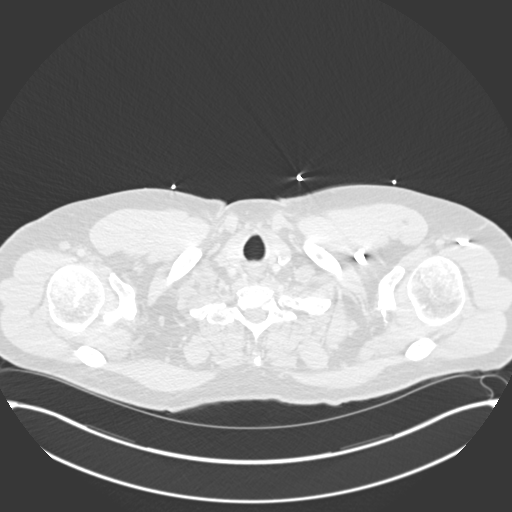

[Series 7: coronal mpr · coronal · 0.71mm/px · 2 of 99 slices shown]
[im 33/99  soft-tissue]
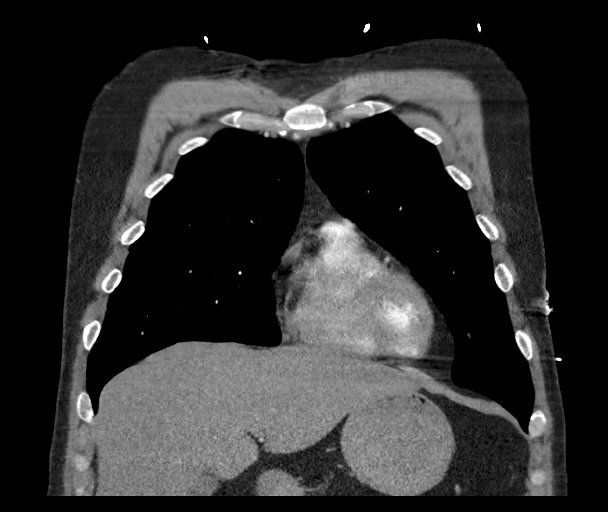
[im 66/99  soft-tissue]
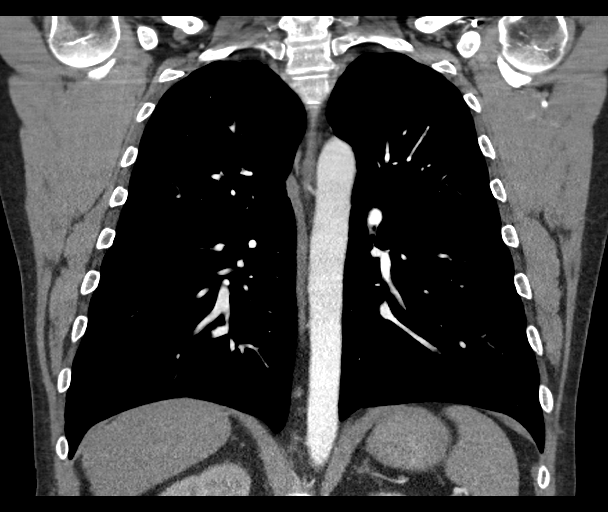

[19 of 46 positions shown; findings below may reference images not displayed]

FINDINGS: Cardiovascular: Satisfactory opacification of pulmonary arteries
noted, and no pulmonary emboli identified. No evidence of thoracic
aortic dissection or aneurysm. Normal heart size. No evidence
pericardial effusion.

Mediastinum/Lymph Nodes: No masses or pathologically enlarged lymph
nodes identified.

Lungs/Pleura: No pulmonary mass, infiltrate, or effusion.

Upper abdomen: No acute findings.

Musculoskeletal: No chest wall mass or suspicious bone lesions
identified.

Review of the MIP images confirms the above findings.
IMPRESSION: Negative. No evidence pulmonary embolism or other active disease
within the thorax.

## 2018-03-15 ENCOUNTER — Other Ambulatory Visit: Payer: Self-pay

## 2018-03-15 ENCOUNTER — Emergency Department: Payer: BLUE CROSS/BLUE SHIELD

## 2018-03-15 ENCOUNTER — Encounter: Payer: Self-pay | Admitting: Emergency Medicine

## 2018-03-15 ENCOUNTER — Inpatient Hospital Stay
Admission: EM | Admit: 2018-03-15 | Discharge: 2018-03-20 | DRG: 392 | Disposition: A | Discharge status: Home / Self Care | Payer: BLUE CROSS/BLUE SHIELD | Attending: Surgery | Admitting: Surgery

## 2018-03-15 DIAGNOSIS — K760 Fatty (change of) liver, not elsewhere classified: Secondary | ICD-10-CM | POA: Diagnosis present

## 2018-03-15 DIAGNOSIS — K5720 Diverticulitis of large intestine with perforation and abscess without bleeding: Secondary | ICD-10-CM | POA: Diagnosis not present

## 2018-03-15 DIAGNOSIS — G4733 Obstructive sleep apnea (adult) (pediatric): Secondary | ICD-10-CM | POA: Diagnosis present

## 2018-03-15 DIAGNOSIS — I708 Atherosclerosis of other arteries: Secondary | ICD-10-CM | POA: Diagnosis present

## 2018-03-15 DIAGNOSIS — K219 Gastro-esophageal reflux disease without esophagitis: Secondary | ICD-10-CM | POA: Diagnosis present

## 2018-03-15 DIAGNOSIS — Z8249 Family history of ischemic heart disease and other diseases of the circulatory system: Secondary | ICD-10-CM

## 2018-03-15 DIAGNOSIS — K5792 Diverticulitis of intestine, part unspecified, without perforation or abscess without bleeding: Secondary | ICD-10-CM

## 2018-03-15 DIAGNOSIS — R1032 Left lower quadrant pain: Secondary | ICD-10-CM | POA: Diagnosis not present

## 2018-03-15 DIAGNOSIS — M5030 Other cervical disc degeneration, unspecified cervical region: Secondary | ICD-10-CM | POA: Diagnosis present

## 2018-03-15 DIAGNOSIS — I10 Essential (primary) hypertension: Secondary | ICD-10-CM | POA: Diagnosis present

## 2018-03-15 DIAGNOSIS — I700 Atherosclerosis of aorta: Secondary | ICD-10-CM | POA: Diagnosis present

## 2018-03-15 DIAGNOSIS — Z885 Allergy status to narcotic agent status: Secondary | ICD-10-CM

## 2018-03-15 DIAGNOSIS — F17210 Nicotine dependence, cigarettes, uncomplicated: Secondary | ICD-10-CM | POA: Diagnosis present

## 2018-03-15 DIAGNOSIS — Z888 Allergy status to other drugs, medicaments and biological substances status: Secondary | ICD-10-CM

## 2018-03-15 DIAGNOSIS — K631 Perforation of intestine (nontraumatic): Secondary | ICD-10-CM

## 2018-03-15 DIAGNOSIS — Z7982 Long term (current) use of aspirin: Secondary | ICD-10-CM

## 2018-03-15 LAB — CBC WITH DIFFERENTIAL/PLATELET
Basophils Absolute: 0.1 10*3/uL (ref 0–0.1)
Basophils Relative: 0 %
EOS ABS: 0.3 10*3/uL (ref 0–0.7)
EOS PCT: 2 %
HCT: 47.6 % (ref 40.0–52.0)
HEMOGLOBIN: 17 g/dL (ref 13.0–18.0)
LYMPHS ABS: 2.5 10*3/uL (ref 1.0–3.6)
Lymphocytes Relative: 16 %
MCH: 33.6 pg (ref 26.0–34.0)
MCHC: 35.8 g/dL (ref 32.0–36.0)
MCV: 93.8 fL (ref 80.0–100.0)
MONOS PCT: 8 %
Monocytes Absolute: 1.3 10*3/uL — ABNORMAL HIGH (ref 0.2–1.0)
Neutro Abs: 11.9 10*3/uL — ABNORMAL HIGH (ref 1.4–6.5)
Neutrophils Relative %: 74 %
PLATELETS: 332 10*3/uL (ref 150–440)
RBC: 5.08 MIL/uL (ref 4.40–5.90)
RDW: 13.7 % (ref 11.5–14.5)
WBC: 16.1 10*3/uL — ABNORMAL HIGH (ref 3.8–10.6)

## 2018-03-15 LAB — URINALYSIS, COMPLETE (UACMP) WITH MICROSCOPIC
Bacteria, UA: NONE SEEN
Bilirubin Urine: NEGATIVE
GLUCOSE, UA: NEGATIVE mg/dL
Hgb urine dipstick: NEGATIVE
Ketones, ur: NEGATIVE mg/dL
Leukocytes, UA: NEGATIVE
Nitrite: NEGATIVE
PROTEIN: NEGATIVE mg/dL
Specific Gravity, Urine: 1.024 (ref 1.005–1.030)
pH: 5 (ref 5.0–8.0)

## 2018-03-15 LAB — COMPREHENSIVE METABOLIC PANEL
ALBUMIN: 4.1 g/dL (ref 3.5–5.0)
ALT: 64 U/L — AB (ref 17–63)
AST: 44 U/L — AB (ref 15–41)
Alkaline Phosphatase: 100 U/L (ref 38–126)
Anion gap: 10 (ref 5–15)
BUN: 8 mg/dL (ref 6–20)
CHLORIDE: 102 mmol/L (ref 101–111)
CO2: 21 mmol/L — AB (ref 22–32)
CREATININE: 0.7 mg/dL (ref 0.61–1.24)
Calcium: 8.8 mg/dL — ABNORMAL LOW (ref 8.9–10.3)
GFR calc Af Amer: >60 mL/min (ref >60)
GFR calc non Af Amer: >60 mL/min (ref >60)
Glucose, Bld: 104 mg/dL — ABNORMAL HIGH (ref 65–99)
Potassium: 4 mmol/L (ref 3.5–5.1)
SODIUM: 133 mmol/L — AB (ref 135–145)
Total Bilirubin: 1.5 mg/dL — ABNORMAL HIGH (ref 0.3–1.2)
Total Protein: 8 g/dL (ref 6.5–8.1)

## 2018-03-15 LAB — LIPASE, BLOOD: LIPASE: 25 U/L (ref 11–51)

## 2018-03-15 LAB — LACTIC ACID, PLASMA
LACTIC ACID, VENOUS: 0.8 mmol/L (ref 0.5–1.9)
Lactic Acid, Venous: 0.8 mmol/L (ref 0.5–1.9)

## 2018-03-15 MED ORDER — KETOROLAC TROMETHAMINE 30 MG/ML IJ SOLN
15.0000 mg | Freq: Once | INTRAMUSCULAR | Status: AC
  Administered 2018-03-15: 15 mg via INTRAVENOUS
  Filled 2018-03-15: qty 1

## 2018-03-15 MED ORDER — MORPHINE SULFATE (PF) 4 MG/ML IV SOLN
4.0000 mg | Freq: Once | INTRAVENOUS | Status: AC
  Administered 2018-03-15: 4 mg via INTRAVENOUS

## 2018-03-15 MED ORDER — SODIUM CHLORIDE 0.9 % IV SOLN: 1.0000 g | Freq: Once | INTRAVENOUS | Status: DC

## 2018-03-15 MED ORDER — IOHEXOL 300 MG/ML  SOLN
100.0000 mL | Freq: Once | INTRAMUSCULAR | Status: AC | PRN
  Administered 2018-03-15: 100 mL via INTRAVENOUS

## 2018-03-15 MED ORDER — FAMOTIDINE IN NACL 20-0.9 MG/50ML-% IV SOLN
20.0000 mg | Freq: Two times a day (BID) | INTRAVENOUS | Status: DC
  Administered 2018-03-15 – 2018-03-19 (×9): 20 mg via INTRAVENOUS
  Filled 2018-03-15 (×9): qty 50

## 2018-03-15 MED ORDER — SODIUM CHLORIDE 0.9 % IV BOLUS
1000.0000 mL | Freq: Once | INTRAVENOUS | Status: AC
  Administered 2018-03-15: 1000 mL via INTRAVENOUS

## 2018-03-15 MED ORDER — AMLODIPINE BESYLATE 5 MG PO TABS
5.0000 mg | ORAL_TABLET | Freq: Every day | ORAL | Status: DC
  Administered 2018-03-15 – 2018-03-19 (×5): 5 mg via ORAL
  Filled 2018-03-15 (×5): qty 1

## 2018-03-15 MED ORDER — SODIUM CHLORIDE 0.9 % IV SOLN
INTRAVENOUS | Status: DC
  Administered 2018-03-15 – 2018-03-18 (×8): via INTRAVENOUS

## 2018-03-15 MED ORDER — NICOTINE 14 MG/24HR TD PT24
MEDICATED_PATCH | TRANSDERMAL | Status: AC
  Administered 2018-03-15: 14 mg via TRANSDERMAL
  Filled 2018-03-15: qty 1

## 2018-03-15 MED ORDER — METOPROLOL SUCCINATE ER 50 MG PO TB24
25.0000 mg | ORAL_TABLET | Freq: Every day | ORAL | Status: DC
  Administered 2018-03-15 – 2018-03-19 (×5): 25 mg via ORAL
  Filled 2018-03-15 (×5): qty 1

## 2018-03-15 MED ORDER — NITROGLYCERIN 0.4 MG SL SUBL: 0.4000 mg | SUBLINGUAL_TABLET | SUBLINGUAL | Status: DC | PRN

## 2018-03-15 MED ORDER — METRONIDAZOLE IN NACL 5-0.79 MG/ML-% IV SOLN: 500.0000 mg | Freq: Once | INTRAVENOUS | Status: DC

## 2018-03-15 MED ORDER — ONDANSETRON HCL 4 MG/2ML IJ SOLN
4.0000 mg | Freq: Once | INTRAMUSCULAR | Status: AC
  Administered 2018-03-15: 4 mg via INTRAVENOUS
  Filled 2018-03-15: qty 2

## 2018-03-15 MED ORDER — MORPHINE SULFATE (PF) 4 MG/ML IV SOLN
4.0000 mg | INTRAVENOUS | Status: DC | PRN
  Administered 2018-03-15 – 2018-03-20 (×17): 4 mg via INTRAVENOUS
  Filled 2018-03-15 (×17): qty 1

## 2018-03-15 MED ORDER — PIPERACILLIN-TAZOBACTAM 3.375 G IVPB
3.3750 g | Freq: Three times a day (TID) | INTRAVENOUS | Status: DC
  Administered 2018-03-15 – 2018-03-20 (×15): 3.375 g via INTRAVENOUS
  Filled 2018-03-15 (×15): qty 50

## 2018-03-15 MED ORDER — MORPHINE SULFATE (PF) 4 MG/ML IV SOLN
INTRAVENOUS | Status: AC
  Administered 2018-03-15: 4 mg via INTRAVENOUS
  Filled 2018-03-15: qty 1

## 2018-03-15 MED ORDER — ENOXAPARIN SODIUM 40 MG/0.4ML ~~LOC~~ SOLN
40.0000 mg | SUBCUTANEOUS | Status: DC
  Administered 2018-03-15 – 2018-03-16 (×2): 40 mg via SUBCUTANEOUS
  Filled 2018-03-15 (×3): qty 0.4

## 2018-03-15 MED ORDER — NICOTINE 14 MG/24HR TD PT24
14.0000 mg | MEDICATED_PATCH | Freq: Every day | TRANSDERMAL | Status: DC
  Administered 2018-03-15 – 2018-03-19 (×5): 14 mg via TRANSDERMAL
  Filled 2018-03-15 (×4): qty 1

## 2018-03-15 MED ORDER — MORPHINE SULFATE (PF) 4 MG/ML IV SOLN
4.0000 mg | Freq: Once | INTRAVENOUS | Status: AC
  Administered 2018-03-15: 4 mg via INTRAVENOUS
  Filled 2018-03-15: qty 1

## 2018-03-15 NOTE — H&P (Signed)
SURGICAL CONSULTATION NOTE   HISTORY OF PRESENT ILLNESS (HPI):  47 y.o. male presented to [facility] ED for evaluation of abdominal pain. Patient reports he had flu like symptoms 5 days ago but two days ago started with significant abdominal pain that he was not able to get out of bed. Pain started on the left lower quadrant and radiates to the left and suprapubic area. Some pain on urination but no air or stool in urine. Denies fever, chills, nausea or vomiting. Pain improved some with pain medication at ED. Cannot associate any aggravator. Upon evaluation at the ED a CT scan of the abdomen and pelvis was done showing inflammation and fat stranding of the sigmoid colon and mesentery. Small air bubble out of the sigmoid colon but no free air. No free fluid. Images were personally reviewed. There is leukocystosis.   Surgery is consulted by Dr. Mable Paris in this context for evaluation and management of acute diverticulitis.  PAST MEDICAL HISTORY (PMH):  Past Medical History:  Diagnosis Date  . Anginal pain ([place])   . Degenerative disc disease, cervical   . GERD (gastroesophageal reflux disease)   . Hypertension   . Sleep apnea      PAST SURGICAL HISTORY [facility]):  Past Surgical History:  Procedure Laterality Date  . APPENDECTOMY    . CARDIAC CATHETERIZATION Left 06/17/2016   Procedure: Left Heart Cath and Coronary Angiography;  Surgeon: Wellington Hampshire, MD;  Location: [place] CV LAB;  Service: Cardiovascular;  Laterality: Left;  . COLONOSCOPY    . COLONOSCOPY WITH PROPOFOL N/A 09/07/2015   Procedure: COLONOSCOPY WITH PROPOFOL;  Surgeon: Hulen Luster, MD;  Location: [facility] ENDOSCOPY;  Service: Gastroenterology;  Laterality: N/A;  . HERNIA REPAIR       MEDICATIONS:  Prior to Admission medications   Medication Sig Start Date End Date Taking? Authorizing Provider  amLODipine (NORVASC) 5 MG tablet Take 5 mg by mouth daily.    [provider]  aspirin EC 81 MG tablet Take 1 tablet (81 mg  total) by mouth daily. 06/12/16   Wende Bushy, MD  atorvastatin (LIPITOR) 40 MG tablet Take 2 tablets (80 mg total) by mouth daily. 06/17/16 09/15/16  Wende Bushy, MD  loratadine (ALLERGY RELIEF) 10 MG tablet Take 10 mg by mouth daily as needed for allergies.    [provider]  metoprolol succinate (TOPROL-XL) 25 MG 24 hr tablet Take 1 tablet (25 mg total) by mouth daily. 04/10/17   Wellington Hampshire, MD  nitroGLYCERIN (NITROSTAT) 0.4 MG SL tablet Place 1 tablet (0.4 mg total) under the tongue every 5 (five) minutes as needed. 06/12/16   Wende Bushy, MD  pantoprazole (PROTONIX) 40 MG tablet Take 40 mg by mouth daily.    [provider]     ALLERGIES:  Allergies  Allergen Reactions  . Chantix [Varenicline]   . Hydrocodone-Homatropine     Itching    . Lexapro [Escitalopram]           SOCIAL HISTORY:  Social History   Socioeconomic History  . Marital status: Single    Spouse name: Not on file  . Number of children: Not on file  . Years of education: Not on file  . Highest education level: Not on file  Occupational History  . Not on file  Social Needs  . Financial resource strain: Not on file  . Food insecurity:    Worry: Not on file    Inability: Not on file  . Transportation needs:  Medical: Not on file    Non-medical: Not on file  Tobacco Use  . Smoking status: Current Every Day Smoker    Packs/day: 1.00    Years: 15.00    Pack years: 15.00  . Smokeless tobacco: Never Used  Substance and Sexual Activity  . Alcohol use: Yes    Alcohol/week: 1.2 oz    Types: 2 Shots of liquor per week  . Drug use: No  . Sexual activity: Not on file  Lifestyle  . Physical activity:    Days per week: Not on file    Minutes per session: Not on file  . Stress: Not on file  Relationships  . Social connections:    Talks on phone: Not on file    Gets together: Not on file    Attends religious service: Not on file    Active member of club or organization: Not on  file    Attends meetings of clubs or organizations: Not on file    Relationship status: Not on file  . Intimate partner violence:    Fear of current or ex partner: Not on file    Emotionally abused: Not on file    Physically abused: Not on file    Forced sexual activity: Not on file  Other Topics Concern  . Not on file  Social History Narrative  . Not on file    The patient currently resides (home / rehab facility / nursing home): Home The patient normally is (ambulatory / bedbound): Ambulatory   FAMILY HISTORY:  Family History  Problem Relation Age of Onset  . Hypertension Father   . Heart disease Father        CABG x 3 & valve replacement.   [name] Heart attack Father      REVIEW OF SYSTEMS:  Constitutional: denies weight loss, fever, chills, or sweats  Eyes: denies any other vision changes, history of eye injury  ENT: denies sore throat, hearing problems. Recent congestive sympotms Respiratory: denies shortness of breath, wheezing. Recent coughing.  Cardiovascular: denies chest pain, palpitations  Gastrointestinal: See HPI Genitourinary: denies burning with urination or urinary frequency Musculoskeletal: denies any other joint pains or cramps  Skin: denies any other rashes or skin discolorations  Neurological: denies any other headache, dizziness, weakness  Psychiatric: denies any other depression, anxiety   All other review of systems were negative   VITAL SIGNS:  Temp:  [98.8 F (37.1 C)] 98.8 F (37.1 C) (06/09 1048) Pulse Rate:  [101] 101 (06/09 1048) Resp:  [18] 18 (06/09 1048) BP: (142)/(97) 142/97 (06/09 1048) SpO2:  [96 %-98 %] 98 % (06/09 1108) Weight:  [104.3 kg (230 lb)] 104.3 kg (230 lb) (06/09 1048)     Height: 5\' 11"  (180.3 cm) Weight: 104.3 kg (230 lb) BMI (Calculated): 32.09   INTAKE/OUTPUT:  This shift: No intake/output data recorded.  Last 2 shifts: @IOLAST2SHIFTS @   PHYSICAL EXAM:  Constitutional:  -- Normal body habitus  -- Awake, alert, and  oriented x3  Eyes:  -- Pupils equally round and reactive to light  -- No scleral icterus  Ear, nose, and throat:  -- No jugular venous distension  Pulmonary:  -- No crackles  -- Equal breath sounds bilaterally -- Breathing non-labored at rest Cardiovascular:  -- S1, S2 present  -- No pericardial rubs Gastrointestinal:  -- Abdomen soft, moderate tender on left lower quadrant and suprapubic area, non-distended, positive guarding. Negative rebound tenderness -- No abdominal masses appreciated, pulsatile or otherwise  Musculoskeletal and  Integumentary:  -- Wounds or skin discoloration: None appreciated -- Extremities: B/L UE and LE FROM, hands and feet warm, no edema  Neurologic:  -- Motor function: intact and symmetric -- Sensation: intact and symmetric   Labs:  CBC Latest Ref Rng & Units 03/15/2018 06/12/2016 06/07/2016  WBC 3.8 - 10.6 K/uL 16.1(H) 8.3 12.2(H)  Hemoglobin 13.0 - 18.0 g/dL 17.0 17.0 16.5  Hematocrit 40.0 - 52.0 % 47.6 46.9 47.1  Platelets 150 - 440 K/uL 332 267 284   CMP Latest Ref Rng & Units 03/15/2018 06/12/2016 06/07/2016  Glucose 65 - 99 mg/dL 104(H) 104(H) 86  BUN 6 - 20 mg/dL 8 10 5(L)  Creatinine 0.61 - 1.24 mg/dL 0.70 0.79 0.71  Sodium 135 - 145 mmol/L 133(L) 140 138  Potassium 3.5 - 5.1 mmol/L 4.0 4.7 3.6  Chloride 101 - 111 mmol/L 102 101 107  CO2 22 - 32 mmol/L 21(L) 20 21(L)  Calcium 8.9 - 10.3 mg/dL 8.8(L) 9.8 9.3  Total Protein 6.5 - 8.1 g/dL 8.0 7.6 -  Total Bilirubin 0.3 - 1.2 mg/dL 1.5(H) <0.2 -  Alkaline Phos 38 - 126 U/L 100 98 -  AST 15 - 41 U/L 44(H) 43(H) -  ALT 17 - 63 U/L 64(H) 42 -   Imaging studies:  EXAM: CT ABDOMEN AND PELVIS WITH CONTRAST  TECHNIQUE: Multidetector CT imaging of the abdomen and pelvis was performed using the standard protocol following bolus administration of intravenous contrast.  CONTRAST:  168mL OMNIPAQUE IOHEXOL 300 MG/ML  SOLN  COMPARISON:  November 02, 2013  FINDINGS: Lower chest: Lung bases are  clear.  Hepatobiliary: There is hepatic steatosis. No focal liver lesions are evident. Gallbladder wall is not appreciably thickened. There is no biliary duct dilatation.  Pancreas: There is no pancreatic mass or inflammatory focus.  Spleen: No splenic lesions are evident. There is a small accessory spleen located between the tail of the pancreas and spleen.  Adrenals/Urinary Tract: Adrenals bilaterally appear normal. Kidneys bilaterally show no evident mass or hydronephrosis on either side. There is no evident renal or ureteral calculus on either side. Urinary bladder is midline with wall thickness within normal limits.  Stomach/Bowel: There is thickening in the wall of the mid sigmoid colon with surrounding soft tissue stranding and thickening consistent with diverticulitis. An irregular diverticulum is noted in this area. There is a tiny focus of air within this region of diverticulitis felt to represent a focus of micro perforation. No abscess evident in this area of diverticulitis. Elsewhere, there is no bowel wall thickening. No bowel obstruction. No free air beyond the microperforation in the area of sigmoid diverticulitis. No portal venous air.  Vascular/Lymphatic: There is atherosclerotic calcification and plaque in the distal aorta as well as in the common iliac arteries. No aneurysm evident. Major mesenteric arterial vessels appear patent. No adenopathy evident in the abdomen or pelvis.  Reproductive: Prostate and seminal vesicles appear normal in size and contour. No pelvic mass evident.  Other: Appendix absent. No abscess or ascites is evident in the abdomen or pelvis. There is fat in each inguinal ring. There is a minimal ventral hernia containing only fat.  Musculoskeletal: There is a small bone island in the left femoral neck, stable. No lytic or destructive bone lesions evident. No intramuscular or abdominal wall lesion evident.  IMPRESSION: 1.  Midportion sigmoid diverticulitis with mesenteric thickening in the area of diverticulitis. Single tiny focus of micro- perforation noted in this area of diverticulitis. No abscess seen in this area.  2. No evident bowel obstruction. No abscess in the abdomen or pelvis. Appendix absent.  3.  No renal or ureteral calculus.  No hydronephrosis.  4.  Hepatic steatosis.  5.  Aortoiliac atherosclerosis.  6. Minimal ventral hernia containing only fat. Fat noted in each inguinal ring.  Aortic Atherosclerosis (ICD10-I70.0).  Critical Value/emergent results were called by telephone at the time of interpretation on 03/15/2018 at 12:52 pm to Dr. Darel Hong , who verbally acknowledged these results.   Electronically Signed   By: Lowella Grip III M.D.   On: 03/15/2018 12:52  Assessment/Plan:  47 y.o. male with acute diverticulisi, complicated by pertinent comorbidities including hypertension. There is significant abdominal pain localized to the left lower quadrant and suprapubic area. There is also leukocytosis. Will admit patient for IV hydration, IV antibiotics, bowel rest until pain and leukocytosis improve. Patient oriented that will try to treat this episode with IV antibiotic therapy. If pain does not improve and patient does not respond to antibiotic therapy, surgical treatment with sigmoid colectomy and colostomy may be considered. Patient understood and agreed to start with conservative management.   All of the above findings and recommendations were discussed with the patient and all of patient's questions were answered to his expressed satisfaction.  Arnold Long, MD

## 2018-03-15 NOTE — ED Provider Notes (Signed)
[facility] Emergency Department Provider Note  ____________________________________________   First MD Initiated Contact with Patient 03/15/18 1059     (approximate)  I have reviewed the triage vital signs and the nursing notes.   HISTORY  Chief Complaint general sickness (multiple complaints) and Abdominal Pain   HPI Omar Turner is a 47 y.o. male who self presents to the emergency department with roughly 5days of muscle aches, sinus congestion, right greater than left ear pain, nonproductive cough, and cramping moderate to severe lower abdominal pain.  He said that about 7 days ago he was bit by a tick on his right thigh that he removed.  He thinks the tick had been on him for 1 to 2 days prior to removal.  He reports low-grade fever at home.  He has a remote history of appendectomy and hernia repair.  He has had several loose stools.  No constipation.  Nothing particular seems to make his symptoms better or worse.  His abdominal pain is nonradiating.  Past Medical History:  Diagnosis Date  . Anginal pain ([place])   . Degenerative disc disease, cervical   . GERD (gastroesophageal reflux disease)   . Hypertension   . Sleep apnea     Patient Active Problem List   Diagnosis Date Noted  . Diverticulitis of large intestine with perforation without abscess or bleeding 03/15/2018  . Precordial pain     Past Surgical History:  Procedure Laterality Date  . APPENDECTOMY    . CARDIAC CATHETERIZATION Left 06/17/2016   Procedure: Left Heart Cath and Coronary Angiography;  Surgeon: Wellington Hampshire, MD;  Location: [place] [facility] LAB;  Service: Cardiovascular;  Laterality: Left;  . COLONOSCOPY    . COLONOSCOPY WITH PROPOFOL N/A 09/07/2015   Procedure: COLONOSCOPY WITH PROPOFOL;  Surgeon: Hulen Luster, MD;  Location: [facility] ENDOSCOPY;  Service: Gastroenterology;  Laterality: N/A;  . HERNIA REPAIR      Prior to Admission medications   Medication Sig Start Date End  Date Taking? Authorizing Provider  amLODipine (NORVASC) 5 MG tablet Take 5 mg by mouth daily.    [provider]  aspirin EC 81 MG tablet Take 1 tablet (81 mg total) by mouth daily. Patient not taking: Reported on 03/15/2018 06/12/16   Wende Bushy, MD  atorvastatin (LIPITOR) 40 MG tablet Take 2 tablets (80 mg total) by mouth daily. 06/17/16 09/15/16  Wende Bushy, MD  loratadine (ALLERGY RELIEF) 10 MG tablet Take 10 mg by mouth daily as needed for allergies.    [provider]  metoprolol succinate (TOPROL-XL) 25 MG 24 hr tablet Take 1 tablet (25 mg total) by mouth daily. Patient not taking: Reported on 03/15/2018 04/10/17   Wellington Hampshire, MD  nitroGLYCERIN (NITROSTAT) 0.4 MG SL tablet Place 1 tablet (0.4 mg total) under the tongue every 5 (five) minutes as needed. 06/12/16   Wende Bushy, MD  pantoprazole (PROTONIX) 40 MG tablet Take 40 mg by mouth daily.    [provider]    Allergies Chantix [varenicline]; Hydrocodone-homatropine; and Lexapro [escitalopram]  Family History  Problem Relation Age of Onset  . Hypertension Father   . Heart disease Father        CABG x 3 & valve replacement.   [name] Heart attack Father     Social History Social History   Tobacco Use  . Smoking status: Current Every Day Smoker    Packs/day: 1.00    Years: 15.00    Pack years: 15.00  .  Smokeless tobacco: Never Used  Substance Use Topics  . Alcohol use: Yes    Alcohol/week: 1.2 oz    Types: 2 Shots of liquor per week  . Drug use: No    Review of Systems Constitutional: Positive for fever Eyes: No visual changes. ENT: Positive for congestion Cardiovascular: Denies chest pain. Respiratory: Positive for cough Gastrointestinal: Positive for abdominal pain.  No nausea, no vomiting.  Positive for diarrhea.  No constipation. Genitourinary: Negative for dysuria. Musculoskeletal: Negative for back pain. Skin: Negative for rash. Neurological: Positive for  headache   ____________________________________________   PHYSICAL EXAM:  VITAL SIGNS: ED Triage Vitals  Enc Vitals Group     BP 03/15/18 1048 (!) 142/97     Pulse Rate 03/15/18 1048 (!) 101     Resp 03/15/18 1048 18     Temp 03/15/18 1048 98.8 F (37.1 C)     Temp Source 03/15/18 1048 Oral     SpO2 03/15/18 1048 96 %     Weight 03/15/18 1048 230 lb (104.3 kg)     Height 03/15/18 1048 5\' 11"  (1.803 m)     Head Circumference --      Peak Flow --      Pain Score 03/15/18 1056 7     Pain Loc --      Pain Edu? --      Excl. in [place]? --     Constitutional: Alert and oriented x4 appears somewhat uncomfortable nontoxic no diaphoresis speaks full clear sentences Eyes: PERRL EOMI. Head: Left tympanic membrane somewhat obscured although not bulging and not erythematous. Nose: Positive for congestion Mouth/Throat: No trismus Neck: No stridor.   Cardiovascular: Tachycardic rate, regular rhythm. Grossly normal heart sounds.  Good peripheral circulation. Respiratory: Normal respiratory effort.  No retractions. Lungs CTAB and moving good air Gastrointestinal: Soft abdomen quite tender left greater than right lower quadrants with some rebound and local peritonitis Musculoskeletal: No lower extremity edema   Neurologic:  Normal speech and language. No gross focal neurologic deficits are appreciated. Skin: He has a small tick bite to his medial right thigh with no evidence of rash surrounding. Psychiatric: Mood and affect are normal. Speech and behavior are normal.    ____________________________________________   DIFFERENTIAL includes but not limited to  [facility] spotted fever, diverticulitis, pyelonephritis, nephrolithiasis, upper respiratory tract infection, pneumonia ____________________________________________   LABS (all labs ordered are listed, but only abnormal results are displayed)  Labs Reviewed  COMPREHENSIVE METABOLIC PANEL - Abnormal; Notable for the following  components:      Result Value   Sodium 133 (*)    CO2 21 (*)    Glucose, Bld 104 (*)    Calcium 8.8 (*)    AST 44 (*)    ALT 64 (*)    Total Bilirubin 1.5 (*)    All other components within normal limits  CBC WITH DIFFERENTIAL/PLATELET - Abnormal; Notable for the following components:   WBC 16.1 (*)    Neutro Abs 11.9 (*)    Monocytes Absolute 1.3 (*)    All other components within normal limits  URINALYSIS, COMPLETE (UACMP) WITH MICROSCOPIC - Abnormal; Notable for the following components:   Color, Urine AMBER (*)    APPearance CLEAR (*)    All other components within normal limits  COMPREHENSIVE METABOLIC PANEL - Abnormal; Notable for the following components:   Potassium 3.4 (*)    Calcium 8.2 (*)    All other components within normal limits  CBC - Abnormal; Notable  for the following components:   WBC 13.7 (*)    All other components within normal limits  CBC - Abnormal; Notable for the following components:   WBC 12.4 (*)    RBC 4.38 (*)    All other components within normal limits  COMPREHENSIVE METABOLIC PANEL - Abnormal; Notable for the following components:   BUN 5 (*)    Calcium 8.3 (*)    Albumin 3.4 (*)    All other components within normal limits  CULTURE, BLOOD (ROUTINE X 2)  CULTURE, BLOOD (ROUTINE X 2)  LIPASE, BLOOD  ROCKY MTN SPOTTED FVR ABS PNL(IGG+IGM)  LACTIC ACID, PLASMA  LACTIC ACID, PLASMA  HIV ANTIBODY (ROUTINE TESTING)  LYME DISEASE DNA BY PCR([facility]IA [facility]URG)    Lab work reviewed by me with elevated white count which is nonspecific __________________________________________  EKG    ____________________________________________  RADIOLOGY  CT abdomen pelvis reviewed by me consistent with diverticulitis with microperforation ____________________________________________   PROCEDURES  Procedure(s) performed: no  Procedures  Critical Care performed: no  Observation: no ____________________________________________   INITIAL  IMPRESSION / ASSESSMENT AND PLAN / ED COURSE  Pertinent labs & imaging results that were available during my care of the patient were reviewed by me and considered in my medical decision making (see chart for details).  The patient arrives uncomfortable appearing with a tender abdomen.  I think his tick bite is likely a red herring as his symptoms began 2 days after the bite which should be a very short incubation.  For [facility] or Lyme disease although it is reasonable that he could have been bitten in the past and not known it.  I will send off those titers but I am more concerned about his abdomen now.  CT scan pending.    ----------------------------------------- 1:04 PM on 03/15/2018 -----------------------------------------  I was called by radiology that the patient CT scan is positive for diverticulitis with focal perforation.  I then discussed the case with on-call general surgeon Dr. Peyton Najjar who has graciously agreed to admit the patient to his service.  I will continue IV hydration now check blood cultures and add on ceftriaxone and Flagyl. 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     Darel Hong, MD 03/17/18 1344

## 2018-03-15 NOTE — ED Notes (Addendum)
Pt states he was bitten by tick with bite on his left thigh. Pt has given urine sample.

## 2018-03-15 NOTE — ED Triage Notes (Addendum)
C/O muscle pain, sinus congestion, swollen glands, ear pain, cough x 5 days.  States was bitten by a tick 7 days ago.  Also c/o lower abdominal pain x 2 days and headaches.

## 2018-03-16 DIAGNOSIS — K5720 Diverticulitis of large intestine with perforation and abscess without bleeding: Secondary | ICD-10-CM | POA: Diagnosis not present

## 2018-03-16 DIAGNOSIS — K760 Fatty (change of) liver, not elsewhere classified: Secondary | ICD-10-CM | POA: Diagnosis present

## 2018-03-16 DIAGNOSIS — I10 Essential (primary) hypertension: Secondary | ICD-10-CM | POA: Diagnosis present

## 2018-03-16 DIAGNOSIS — Z888 Allergy status to other drugs, medicaments and biological substances status: Secondary | ICD-10-CM | POA: Diagnosis not present

## 2018-03-16 DIAGNOSIS — G4733 Obstructive sleep apnea (adult) (pediatric): Secondary | ICD-10-CM | POA: Diagnosis present

## 2018-03-16 DIAGNOSIS — Z885 Allergy status to narcotic agent status: Secondary | ICD-10-CM | POA: Diagnosis not present

## 2018-03-16 DIAGNOSIS — Z7982 Long term (current) use of aspirin: Secondary | ICD-10-CM | POA: Diagnosis not present

## 2018-03-16 DIAGNOSIS — I700 Atherosclerosis of aorta: Secondary | ICD-10-CM | POA: Diagnosis present

## 2018-03-16 DIAGNOSIS — F17210 Nicotine dependence, cigarettes, uncomplicated: Secondary | ICD-10-CM | POA: Diagnosis present

## 2018-03-16 DIAGNOSIS — I708 Atherosclerosis of other arteries: Secondary | ICD-10-CM | POA: Diagnosis present

## 2018-03-16 DIAGNOSIS — K219 Gastro-esophageal reflux disease without esophagitis: Secondary | ICD-10-CM | POA: Diagnosis present

## 2018-03-16 DIAGNOSIS — M5030 Other cervical disc degeneration, unspecified cervical region: Secondary | ICD-10-CM | POA: Diagnosis present

## 2018-03-16 DIAGNOSIS — Z8249 Family history of ischemic heart disease and other diseases of the circulatory system: Secondary | ICD-10-CM | POA: Diagnosis not present

## 2018-03-16 DIAGNOSIS — R1032 Left lower quadrant pain: Secondary | ICD-10-CM | POA: Diagnosis present

## 2018-03-16 LAB — CBC
HCT: 44.4 % (ref 40.0–52.0)
Hemoglobin: 15.6 g/dL (ref 13.0–18.0)
MCH: 32.7 pg (ref 26.0–34.0)
MCHC: 35.2 g/dL (ref 32.0–36.0)
MCV: 93 fL (ref 80.0–100.0)
PLATELETS: 238 10*3/uL (ref 150–440)
RBC: 4.78 MIL/uL (ref 4.40–5.90)
RDW: 14 % (ref 11.5–14.5)
WBC: 13.7 10*3/uL — ABNORMAL HIGH (ref 3.8–10.6)

## 2018-03-16 LAB — COMPREHENSIVE METABOLIC PANEL WITH GFR
ALT: 47 U/L (ref 17–63)
AST: 28 U/L (ref 15–41)
Albumin: 3.6 g/dL (ref 3.5–5.0)
Alkaline Phosphatase: 86 U/L (ref 38–126)
Anion gap: 9 (ref 5–15)
BUN: 7 mg/dL (ref 6–20)
CO2: 22 mmol/L (ref 22–32)
Calcium: 8.2 mg/dL — ABNORMAL LOW (ref 8.9–10.3)
Chloride: 104 mmol/L (ref 101–111)
Creatinine, Ser: 0.7 mg/dL (ref 0.61–1.24)
GFR calc Af Amer: >60 mL/min
GFR calc non Af Amer: >60 mL/min
Glucose, Bld: 92 mg/dL (ref 65–99)
Potassium: 3.4 mmol/L — ABNORMAL LOW (ref 3.5–5.1)
Sodium: 135 mmol/L (ref 135–145)
Total Bilirubin: 0.9 mg/dL (ref 0.3–1.2)
Total Protein: 7 g/dL (ref 6.5–8.1)

## 2018-03-16 LAB — HIV ANTIBODY (ROUTINE TESTING W REFLEX): HIV SCREEN 4TH GENERATION: NONREACTIVE

## 2018-03-16 MED ORDER — LACTATED RINGERS IV BOLUS
1000.0000 mL | Freq: Once | INTRAVENOUS | Status: AC
  Administered 2018-03-16: 1000 mL via INTRAVENOUS

## 2018-03-16 NOTE — Progress Notes (Signed)
CC: Diverticulitis Subjective: Patient reports persistent pain but somewhat improved as compared to yesterday.  CT scan personal review showing evidence of diverticulitis with microperforation.  No abscess no free air. White Count is trending down.  Normal creatinine.  Objective: Vital signs in last 24 hours: Temp:  [98.7 F (37.1 C)-99.8 F (37.7 C)] 98.7 F (37.1 C) (06/10 0528) Pulse Rate:  [88-90] 88 (06/10 0528) Resp:  [18] 18 (06/10 0528) BP: (129-135)/(84-92) 129/92 (06/10 0528) SpO2:  [96 %-99 %] 97 % (06/10 0528) Last BM Date: 03/15/18  Intake/Output from previous day: 06/09 0701 - 06/10 0700 In: 1699 [I.V.:1575; IV Piggyback:124] Out: 650 [Urine:650] Intake/Output this shift: No intake/output data recorded.  Physical exam:  NAD, awake and alert Abd: soft, mild TTP LLQ, no peritonitis Ext: no edema and well perfused  Lab Results: CBC  Recent Labs    03/15/18 1110 03/16/18 0532  WBC 16.1* 13.7*  HGB 17.0 15.6  HCT 47.6 44.4  PLT 332 238   BMET Recent Labs    03/15/18 1110 03/16/18 0532  NA 133* 135  K 4.0 3.4*  CL 102 104  CO2 21* 22  GLUCOSE 104* 92  BUN 8 7  CREATININE 0.70 0.70  CALCIUM 8.8* 8.2*   PT/INR No results for input(s): LABPROT, INR in the last 72 hours. ABG No results for input(s): PHART, HCO3 in the last 72 hours.  Invalid input(s): PCO2, PO2  Studies/Results: Dg Chest 2 View  Result Date: 03/15/2018 CLINICAL DATA:  Cough and fever.  Recent tick bite EXAM: CHEST - 2 VIEW COMPARISON:  October 29, 2016 FINDINGS: Lungs are clear. Heart size and pulmonary vascularity are normal. No adenopathy. No bone lesions. IMPRESSION: No edema or consolidation. Electronically Signed   By: Lowella Grip III M.D.   On: 03/15/2018 12:43   Ct Abdomen Pelvis W Contrast  Result Date: 03/15/2018 CLINICAL DATA:  Abdominal pain and fever.  Recent tick bite EXAM: CT ABDOMEN AND PELVIS WITH CONTRAST TECHNIQUE: Multidetector CT imaging of the abdomen  and pelvis was performed using the standard protocol following bolus administration of intravenous contrast. CONTRAST:  176mL OMNIPAQUE IOHEXOL 300 MG/ML  SOLN COMPARISON:  November 02, 2013 FINDINGS: Lower chest: Lung bases are clear. Hepatobiliary: There is hepatic steatosis. No focal liver lesions are evident. Gallbladder wall is not appreciably thickened. There is no biliary duct dilatation. Pancreas: There is no pancreatic mass or inflammatory focus. Spleen: No splenic lesions are evident. There is a small accessory spleen located between the tail of the pancreas and spleen. Adrenals/Urinary Tract: Adrenals bilaterally appear normal. Kidneys bilaterally show no evident mass or hydronephrosis on either side. There is no evident renal or ureteral calculus on either side. Urinary bladder is midline with wall thickness within normal limits. Stomach/Bowel: There is thickening in the wall of the mid sigmoid colon with surrounding soft tissue stranding and thickening consistent with diverticulitis. An irregular diverticulum is noted in this area. There is a tiny focus of air within this region of diverticulitis felt to represent a focus of micro perforation. No abscess evident in this area of diverticulitis. Elsewhere, there is no bowel wall thickening. No bowel obstruction. No free air beyond the microperforation in the area of sigmoid diverticulitis. No portal venous air. Vascular/Lymphatic: There is atherosclerotic calcification and plaque in the distal aorta as well as in the common iliac arteries. No aneurysm evident. Major mesenteric arterial vessels appear patent. No adenopathy evident in the abdomen or pelvis. Reproductive: Prostate and seminal vesicles appear normal  in size and contour. No pelvic mass evident. Other: Appendix absent. No abscess or ascites is evident in the abdomen or pelvis. There is fat in each inguinal ring. There is a minimal ventral hernia containing only fat. Musculoskeletal: There is a  small bone island in the left femoral neck, stable. No lytic or destructive bone lesions evident. No intramuscular or abdominal wall lesion evident. IMPRESSION: 1. Midportion sigmoid diverticulitis with mesenteric thickening in the area of diverticulitis. Single tiny focus of micro- perforation noted in this area of diverticulitis. No abscess seen in this area. 2. No evident bowel obstruction. No abscess in the abdomen or pelvis. Appendix absent. 3.  No renal or ureteral calculus.  No hydronephrosis. 4.  Hepatic steatosis. 5.  Aortoiliac atherosclerosis. 6. Minimal ventral hernia containing only fat. Fat noted in each inguinal ring. Aortic Atherosclerosis (ICD10-I70.0). Critical Value/emergent results were called by telephone at the time of interpretation on 03/15/2018 at 12:52 pm to Dr. Darel Hong , who verbally acknowledged these results. Electronically Signed   By: Lowella Grip III M.D.   On: 03/15/2018 12:52    Anti-infectives: Anti-infectives (From admission, onward)   Start     Dose/Rate Route Frequency Ordered Stop   03/15/18 1330  piperacillin-tazobactam (ZOSYN) IVPB 3.375 g     3.375 g 12.5 mL/hr over 240 Minutes Intravenous Every 8 hours 03/15/18 1328     03/15/18 1300  cefTRIAXone (ROCEPHIN) 1 g in sodium chloride 0.9 % 100 mL IVPB  Status:  Discontinued     1 g 200 mL/hr over 30 Minutes Intravenous  Once 03/15/18 1254 03/15/18 1328   03/15/18 1300  metroNIDAZOLE (FLAGYL) IVPB 500 mg  Status:  Discontinued     500 mg 100 mL/hr over 60 Minutes Intravenous  Once 03/15/18 1254 03/15/18 1328      Assessment/Plan:  Diverticulitis w microperforation. Having some symptoms.  Continue clear liquid diets and antibiotic therapy.  No need for emergent surgical intervention at this time Given his age and his episode of microperforation I do think that he will benefit from elective sigmoid colectomy as an outpatient after completion of colonoscopy  Caroleen Hamman, MD,  FACS  03/16/2018

## 2018-03-17 LAB — COMPREHENSIVE METABOLIC PANEL
ALBUMIN: 3.4 g/dL — AB (ref 3.5–5.0)
ALT: 33 U/L (ref 17–63)
AST: 20 U/L (ref 15–41)
Alkaline Phosphatase: 76 U/L (ref 38–126)
Anion gap: 8 (ref 5–15)
BUN: 5 mg/dL — ABNORMAL LOW (ref 6–20)
CHLORIDE: 103 mmol/L (ref 101–111)
CO2: 24 mmol/L (ref 22–32)
CREATININE: 0.71 mg/dL (ref 0.61–1.24)
Calcium: 8.3 mg/dL — ABNORMAL LOW (ref 8.9–10.3)
GFR calc non Af Amer: >60 mL/min (ref >60)
Glucose, Bld: 87 mg/dL (ref 65–99)
Potassium: 3.5 mmol/L (ref 3.5–5.1)
SODIUM: 135 mmol/L (ref 135–145)
Total Bilirubin: 0.8 mg/dL (ref 0.3–1.2)
Total Protein: 6.6 g/dL (ref 6.5–8.1)

## 2018-03-17 LAB — CBC
HCT: 40.6 % (ref 40.0–52.0)
Hemoglobin: 14.4 g/dL (ref 13.0–18.0)
MCH: 32.9 pg (ref 26.0–34.0)
MCHC: 35.5 g/dL (ref 32.0–36.0)
MCV: 92.7 fL (ref 80.0–100.0)
PLATELETS: 219 10*3/uL (ref 150–440)
RBC: 4.38 MIL/uL — AB (ref 4.40–5.90)
RDW: 13.7 % (ref 11.5–14.5)
WBC: 12.4 10*3/uL — AB (ref 3.8–10.6)

## 2018-03-17 LAB — ROCKY MTN SPOTTED FVR ABS PNL(IGG+IGM)
RMSF IGG: NEGATIVE
RMSF IgM: 0.4 index (ref 0.00–0.89)

## 2018-03-17 MED ORDER — ACETAMINOPHEN 325 MG PO TABS
650.0000 mg | ORAL_TABLET | ORAL | Status: DC | PRN
Start: 2018-03-17 — End: 2018-03-20
  Administered 2018-03-18 – 2018-03-19 (×2): 650 mg via ORAL
  Filled 2018-03-17 (×2): qty 2

## 2018-03-17 MED ORDER — OXYCODONE-ACETAMINOPHEN 7.5-325 MG PO TABS
2.0000 | ORAL_TABLET | ORAL | Status: DC | PRN
  Administered 2018-03-17: 2 via ORAL
  Filled 2018-03-17: qty 2

## 2018-03-17 MED ORDER — KETOROLAC TROMETHAMINE 30 MG/ML IJ SOLN
30.0000 mg | Freq: Four times a day (QID) | INTRAMUSCULAR | Status: DC | PRN
  Administered 2018-03-18 – 2018-03-19 (×4): 30 mg via INTRAVENOUS
  Filled 2018-03-17 (×4): qty 1

## 2018-03-17 MED ORDER — DOCUSATE SODIUM 100 MG PO CAPS
100.0000 mg | ORAL_CAPSULE | Freq: Two times a day (BID) | ORAL | Status: DC
  Administered 2018-03-17 – 2018-03-19 (×5): 100 mg via ORAL
  Filled 2018-03-17 (×5): qty 1

## 2018-03-17 NOTE — Progress Notes (Signed)
CC: Diverticulitis  subjective: Having some intermittent abdominal pain but overall better.  White count is trending down.  No fevers or chills.  Taking clears well  Objective: Vital signs in last 24 hours: Temp:  [98.2 F (36.8 C)-99.6 F (37.6 C)] 98.8 F (37.1 C) (06/11 1240) Pulse Rate:  [84-93] 84 (06/11 1240) Resp:  [16-18] 18 (06/11 1240) BP: (124-134)/(79-92) 134/92 (06/11 1240) SpO2:  [97 %-98 %] 98 % (06/11 1240) Last BM Date: 03/15/18  Intake/Output from previous day: 06/10 0701 - 06/11 0700 In: 3716 [P.O.:840; I.V.:2776; IV Piggyback:100] Out: 3950 [Urine:3950] Intake/Output this shift: Total I/O In: 1040 [P.O.:240; I.V.:700; IV Piggyback:100] Out: 925 [Urine:925]  Physical exam: NAD, awake and alert Abd: soft, mild TTP LLQ, no peritonitis. Actually exam is slighter better as compared to yesterday Ext: no edema and well perfused     Lab Results: CBC  Recent Labs    03/16/18 0532 03/17/18 0520  WBC 13.7* 12.4*  HGB 15.6 14.4  HCT 44.4 40.6  PLT 238 219   BMET Recent Labs    03/16/18 0532 03/17/18 0520  NA 135 135  K 3.4* 3.5  CL 104 103  CO2 22 24  GLUCOSE 92 87  BUN 7 5*  CREATININE 0.70 0.71  CALCIUM 8.2* 8.3*   PT/INR No results for input(s): LABPROT, INR in the last 72 hours. ABG No results for input(s): PHART, HCO3 in the last 72 hours.  Invalid input(s): PCO2, PO2  Studies/Results: No results found.  Anti-infectives: Anti-infectives (From admission, onward)   Start     Dose/Rate Route Frequency Ordered Stop   03/15/18 1330  piperacillin-tazobactam (ZOSYN) IVPB 3.375 g     3.375 g 12.5 mL/hr over 240 Minutes Intravenous Every 8 hours 03/15/18 1328     03/15/18 1300  cefTRIAXone (ROCEPHIN) 1 g in sodium chloride 0.9 % 100 mL IVPB  Status:  Discontinued     1 g 200 mL/hr over 30 Minutes Intravenous  Once 03/15/18 1254 03/15/18 1328   03/15/18 1300  metroNIDAZOLE (FLAGYL) IVPB 500 mg  Status:  Discontinued     500 mg 100  mL/hr over 60 Minutes Intravenous  Once 03/15/18 1254 03/15/18 1328      Assessment/Plan: Diverticulitis with microperforation.  Slowly improving.   Will advance to a full liquid diet. No need for emergent surgical intervention. Continue to perform serial abdominal exams and hopefully DC in the next 24 to 48 hours   Caroleen Hamman, MD, [facility]  03/17/2018

## 2018-03-18 DIAGNOSIS — K5720 Diverticulitis of large intestine with perforation and abscess without bleeding: Secondary | ICD-10-CM

## 2018-03-18 LAB — CBC
HEMATOCRIT: 38.1 % — AB (ref 40.0–52.0)
Hemoglobin: 13.9 g/dL (ref 13.0–18.0)
MCH: 33.7 pg (ref 26.0–34.0)
MCHC: 36.5 g/dL — ABNORMAL HIGH (ref 32.0–36.0)
MCV: 92.2 fL (ref 80.0–100.0)
PLATELETS: 247 10*3/uL (ref 150–440)
RBC: 4.13 MIL/uL — AB (ref 4.40–5.90)
RDW: 14.1 % (ref 11.5–14.5)
WBC: 10.8 10*3/uL — ABNORMAL HIGH (ref 3.8–10.6)

## 2018-03-18 NOTE — Progress Notes (Signed)
SURGICAL PROGRESS NOTE    Omar Turner  YKD:[id number] DOB: 1971/03/01 DOA: 03/15/2018  History: The patient is a 47 year-old male with a history of GERD, OSA, degenerative cervical disc disease, and HTN who is admitted with diverticulitis of sigmoid colon with microperforation.  Subjective: The patient notes improvement but some lingering pain in LLQ. He notes some nausea after eating but denies vomiting. He denies BM but reports flatus. He denies fever and chills. He denies chest pain and SOB.  Objective: Vitals:   03/17/18 2144 03/18/18 0336 03/18/18 0936 03/18/18 1306  BP: 127/88 123/87 (!) 149/96 122/83  Pulse: 85 72 83 86  Resp: 18 20  16   Temp: 98.3 F (36.8 C) 97.8 F (36.6 C) 98.1 F (36.7 C) 98.1 F (36.7 C)  TempSrc: Oral Oral Oral Oral  SpO2: 99% 97% 99% 98%  Weight:      Height:        Examination:  General exam: Appears calm and comfortable  Respiratory system: Respiratory effort normal. Cardiovascular system: RRR. No pedal edema. Gastrointestinal system: Abdomen is nondistended, soft and moderately tender in LLQ. Normal bowel sounds heard. Central nervous system: Alert and oriented. No focal neurological deficits. Extremities: No calf tenderness or swelling. Skin: No rashes, lesions or ulcers Psychiatry: Judgement and insight appear normal. Mood & affect appropriate.    Data Reviewed: I have personally reviewed following labs and imaging studies  CBC: Recent Labs  Lab 03/15/18 1110 03/16/18 0532 03/17/18 0520 03/18/18 0308  WBC 16.1* 13.7* 12.4* 10.8*  NEUTROABS 11.9*  --   --   --   HGB 17.0 15.6 14.4 13.9  HCT 47.6 44.4 40.6 38.1*  MCV 93.8 93.0 92.7 92.2  PLT 332 238 219 397   Basic Metabolic Panel: Recent Labs  Lab 03/15/18 1110 03/16/18 0532 03/17/18 0520  NA 133* 135 135  K 4.0 3.4* 3.5  CL 102 104 103  CO2 21* 22 24  GLUCOSE 104* 92 87  BUN 8 7 5*  CREATININE 0.70 0.70 0.71  CALCIUM 8.8* 8.2* 8.3*   Liver Function  Tests: Recent Labs  Lab 03/15/18 1110 03/16/18 0532 03/17/18 0520  AST 44* 28 20  ALT 64* 47 33  ALKPHOS 100 86 76  BILITOT 1.5* 0.9 0.8  PROT 8.0 7.0 6.6  ALBUMIN 4.1 3.6 3.4*      Scheduled Meds: . amLODipine  5 mg Oral Daily  . docusate sodium  100 mg Oral BID  . enoxaparin (LOVENOX) injection  40 mg Subcutaneous Q24H  . metoprolol succinate  25 mg Oral Daily  . nicotine  14 mg Transdermal Daily   Continuous Infusions: . sodium chloride 75 mL/hr at 03/18/18 1220  . famotidine (PEPCID) IV Stopped (03/18/18 1022)  . piperacillin-tazobactam (ZOSYN)  IV 3.375 g (03/18/18 1220)     LOS: 2 days    Assessment/Plan: The patient is a 47 year-old male with a history of GERD, OSA, degenerative cervical disc disease, and HTN who is admitted with diverticulitis of sigmoid colon with microperforation. Leukocytosis improved.  - Continue IV antibiotics - Advance diet to soft/low residue - Discontinue IVF - Continue DVT prophylaxis with Lovenox - Encourage ambulation - Continue home medications for HTN - No need for emergent surgical intervention - Follow up as outpatient in surgery clinic - Will require colonoscopy as outpatient - Likely discharge home tomorrow   Hadley Pen, DO

## 2018-03-19 DIAGNOSIS — K5720 Diverticulitis of large intestine with perforation and abscess without bleeding: Principal | ICD-10-CM

## 2018-03-19 NOTE — Progress Notes (Signed)
Patient describes ongoing pain but it is improved.  He is tolerating a diet.  Discussed with him discharge this afternoon versus tomorrow morning and he prefers to wait till tomorrow morning.  He will need to go home on oral antibiotics and oral analgesics.  Because he is also had some bleeding or blood in his stool he will require a colonoscopy and outpatient GI consultation which can be arranged through our office at the time of follow-up.

## 2018-03-19 NOTE — Progress Notes (Signed)
CC: Left lower quadrant pain Subjective: This a patient with acute diverticulitis.  He feels better today but still has considerable pain no nausea vomiting no fevers or chills.  He states that he is wanting to go home today but his wife thinks he is not ready yet.  He is concerned that he is still having considerable tenderness  Objective: Vital signs in last 24 hours: Temp:  [98 F (36.7 C)-98.2 F (36.8 C)] 98.2 F (36.8 C) (06/13 0542) Pulse Rate:  [76-86] 78 (06/13 0542) Resp:  [16-18] 18 (06/13 0542) BP: (122-133)/(83-92) 133/92 (06/13 0542) SpO2:  [97 %-98 %] 97 % (06/13 0542) Last BM Date: 03/15/18  Intake/Output from previous day: 06/12 0701 - 06/13 0700 In: 1303 [P.O.:240; I.V.:813; IV Piggyback:250] Out: 2800 [Urine:2800] Intake/Output this shift: No intake/output data recorded.  Physical exam:  Signs stable and reviewed afebrile abdomen is soft tender in the left lower quadrant with minimal guarding no rebound or percussion tenderness no tender To calves.  Lab Results: CBC  Recent Labs    03/17/18 0520 03/18/18 0308  WBC 12.4* 10.8*  HGB 14.4 13.9  HCT 40.6 38.1*  PLT 219 247   BMET Recent Labs    03/17/18 0520  NA 135  K 3.5  CL 103  CO2 24  GLUCOSE 87  BUN 5*  CREATININE 0.71  CALCIUM 8.3*   PT/INR No results for input(s): LABPROT, INR in the last 72 hours. ABG No results for input(s): PHART, HCO3 in the last 72 hours.  Invalid input(s): PCO2, PO2  Studies/Results: No results found.  Anti-infectives: Anti-infectives (From admission, onward)   Start     Dose/Rate Route Frequency Ordered Stop   03/15/18 1330  piperacillin-tazobactam (ZOSYN) IVPB 3.375 g     3.375 g 12.5 mL/hr over 240 Minutes Intravenous Every 8 hours 03/15/18 1328     03/15/18 1300  cefTRIAXone (ROCEPHIN) 1 g in sodium chloride 0.9 % 100 mL IVPB  Status:  Discontinued     1 g 200 mL/hr over 30 Minutes Intravenous  Once 03/15/18 1254 03/15/18 1328   03/15/18 1300   metroNIDAZOLE (FLAGYL) IVPB 500 mg  Status:  Discontinued     500 mg 100 mL/hr over 60 Minutes Intravenous  Once 03/15/18 1254 03/15/18 1328      Assessment/Plan:  No labs today to review.  Patient and wife prefer to stay another day and with his considerable tenderness I would recommend repeating CBC tomorrow morning and probably discharge tomorrow he is very nervous about switching to oral antibiotics with the risk of recurrence or worsening.  Florene Glen, MD, FACS  03/19/2018

## 2018-03-20 ENCOUNTER — Other Ambulatory Visit: Payer: Self-pay

## 2018-03-20 DIAGNOSIS — G4730 Sleep apnea, unspecified: Secondary | ICD-10-CM | POA: Insufficient documentation

## 2018-03-20 DIAGNOSIS — I10 Essential (primary) hypertension: Secondary | ICD-10-CM | POA: Insufficient documentation

## 2018-03-20 DIAGNOSIS — M5030 Other cervical disc degeneration, unspecified cervical region: Secondary | ICD-10-CM | POA: Insufficient documentation

## 2018-03-20 LAB — CBC WITH DIFFERENTIAL/PLATELET
BASOS ABS: 0 10*3/uL (ref 0–0.1)
BASOS PCT: 1 %
EOS PCT: 5 %
Eosinophils Absolute: 0.5 10*3/uL (ref 0–0.7)
HCT: 43.1 % (ref 40.0–52.0)
Hemoglobin: 15.1 g/dL (ref 13.0–18.0)
Lymphocytes Relative: 38 %
Lymphs Abs: 3.4 10*3/uL (ref 1.0–3.6)
MCH: 32.6 pg (ref 26.0–34.0)
MCHC: 35.1 g/dL (ref 32.0–36.0)
MCV: 92.8 fL (ref 80.0–100.0)
MONO ABS: 0.8 10*3/uL (ref 0.2–1.0)
Monocytes Relative: 9 %
NEUTROS ABS: 4.3 10*3/uL (ref 1.4–6.5)
Neutrophils Relative %: 47 %
PLATELETS: 329 10*3/uL (ref 150–440)
RBC: 4.64 MIL/uL (ref 4.40–5.90)
RDW: 13.9 % (ref 11.5–14.5)
WBC: 9 10*3/uL (ref 3.8–10.6)

## 2018-03-20 LAB — CULTURE, BLOOD (ROUTINE X 2)
CULTURE: NO GROWTH
Culture: NO GROWTH
SPECIAL REQUESTS: ADEQUATE
SPECIAL REQUESTS: ADEQUATE

## 2018-03-20 LAB — LYME DISEASE DNA BY PCR(BORRELIA BURG): LYME DISEASE(B. BURGDORFERI) PCR: NEGATIVE

## 2018-03-20 MED ORDER — AMOXICILLIN-POT CLAVULANATE 875-125 MG PO TABS: 1.0000 | ORAL_TABLET | Freq: Two times a day (BID) | ORAL | 0 refills | Status: AC

## 2018-03-20 NOTE — Progress Notes (Signed)
03/20/2018 11:52 AM  Rosie Fate to be D/C'd Home per MD order.  Discussed prescriptions and follow up appointments with the patient. Prescriptions given to patient, medication list explained in detail. Pt verbalized understanding.  Allergies as of 03/20/2018      Reactions   Chantix [varenicline]    Hydrocodone-homatropine    Itching; sweating, n/v   Lexapro [escitalopram]          Medication List    TAKE these medications   ALLERGY RELIEF 10 MG tablet Generic drug:  loratadine Take 10 mg by mouth daily as needed for allergies.   amLODipine 5 MG tablet Commonly known as:  NORVASC Take 5 mg by mouth daily.   amoxicillin-clavulanate 875-125 MG tablet Commonly known as:  AUGMENTIN Take 1 tablet by mouth 2 (two) times daily for 10 days.   aspirin EC 81 MG tablet Take 1 tablet (81 mg total) by mouth daily.   atorvastatin 40 MG tablet Commonly known as:  LIPITOR Take 2 tablets (80 mg total) by mouth daily.   metoprolol succinate 25 MG 24 hr tablet Commonly known as:  TOPROL-XL Take 1 tablet (25 mg total) by mouth daily.   nitroGLYCERIN 0.4 MG SL tablet Commonly known as:  NITROSTAT Place 1 tablet (0.4 mg total) under the tongue every 5 (five) minutes as needed.   pantoprazole 40 MG tablet Commonly known as:  PROTONIX Take 40 mg by mouth daily.       Vitals:   03/19/18 2212 03/20/18 0541  BP: (!) 137/98 (!) 139/93  Pulse: 77 69  Resp: 18 18  Temp: 98.3 F (36.8 C) 97.7 F (36.5 C)  SpO2: 99% 99%    Skin clean, dry and intact without evidence of skin break down, no evidence of skin tears noted. IV catheter discontinued intact. Site without signs and symptoms of complications. Dressing and pressure applied. Pt denies pain at this time. No complaints noted.  An After Visit Summary was printed and given to the patient. Patient escorted via [place], and D/C home via private auto.  Dola Argyle

## 2018-03-23 ENCOUNTER — Encounter: Payer: Self-pay | Admitting: Surgery

## 2018-03-23 ENCOUNTER — Ambulatory Visit (INDEPENDENT_AMBULATORY_CARE_PROVIDER_SITE_OTHER): Payer: BLUE CROSS/BLUE SHIELD | Admitting: Surgery

## 2018-03-23 VITALS — BP 111/75 | HR 106 | Temp 98.6°F | Ht 71.0 in | Wt 218.0 lb

## 2018-03-23 DIAGNOSIS — K5732 Diverticulitis of large intestine without perforation or abscess without bleeding: Secondary | ICD-10-CM

## 2018-03-23 NOTE — Progress Notes (Signed)
Outpatient Surgical Follow Up  03/23/2018  Omar Turner is an 47 y.o. male.   Chief Complaint  Patient presents with  . Follow-up    Diverticulitis    HPI: Omar Turner is a 47 year old male well-known to [place] for recent hospitalization for perforated diverticulitis.  He did well with medical management.  He now is continue his antibiotic therapy and is doing well.  His pain has continued to improve.  He intermittent abdominal pain but is significantly better.  He did have some hematochezia a few days ago but now he has normal bowel movements. Tell me that apparently he had a colonoscopy 8 years ago and they found to have polyps but the patient is unsure if they were precancerous or not.  Past Medical History:  Diagnosis Date  . Anginal pain ([place])   . Anxiety   . Degenerative disc disease, cervical   . Diverticulitis large intestine   . GERD (gastroesophageal reflux disease)   . History of chest pain   . Hyperlipidemia   . Hypertension   . Sleep apnea   . Tobacco abuse     Past Surgical History:  Procedure Laterality Date  . APPENDECTOMY    . CARDIAC CATHETERIZATION Left 06/17/2016   Procedure: Left Heart Cath and Coronary Angiography;  Surgeon: Wellington Hampshire, MD;  Location: [place] [facility];  Service: Cardiovascular;  Laterality: Left;  . COLONOSCOPY    . COLONOSCOPY WITH PROPOFOL N/A 09/07/2015   Procedure: COLONOSCOPY WITH PROPOFOL;  Surgeon: Hulen Luster, MD;  Location: [facility];  Service: Gastroenterology;  Laterality: N/A;  . HERNIA REPAIR      Family History  Problem Relation Age of Onset  . Hypertension Father   . Heart disease Father        CABG x 3 & valve replacement.   [name] Heart attack Father     Social History:  reports that he has been smoking.  He has a 15.00 pack-year smoking history. He has never used smokeless tobacco. He reports that he drinks about 1.2 oz of alcohol per week. He reports that he does not use drugs.  Allergies:  Allergies  Allergen  Reactions  . Chantix [Varenicline]   . Hydrocodone-Homatropine     Itching; sweating, n/v   . Lexapro [Escitalopram]          Medications reviewed.    ROS Full ROS performed and is otherwise negative other than what is stated in HPI   BP 111/75   Pulse (!) 106   Temp 98.6 F (37 C) (Oral)   Ht 5\' 11"  (1.803 m)   Wt 98.9 kg (218 lb)   BMI 30.40 kg/m   Physical Exam NAD, awake and alert Abd: soft, Nt , no peritonitis Ext: no edema and well perfused    Assessment/Plan:  1. Diverticulitis of colon Discussed with the patient detail about the thought process for elective sigmoid colectomy.  Given his age and his episode of perforated diverticulitis that required inpatient management I do think is reasonable to recommend laparoscopic sigmoid colectomy.  Before performing a colectomy will have to evaluate the rest of his colon.  We will set for colonoscopy with either Dr. Vicente Males Dr. Jennette Banker in the next month or so. No Need for emergent surgical intervention at this time   Caroleen Hamman, MD [place] [facility]

## 2018-03-23 NOTE — Patient Instructions (Addendum)
We will send a referral for you to see the Gastroenterologist and once you have your colonoscopy done, we will schedule you an appointment for you to be seen by Dr. Dahlia Byes to discuss results and possible surgery.  The gastroenterologist office will contact you with date and time of your appointment.   Diverticulitis Diverticulitis is when small pockets in your large intestine (colon) get infected or swollen. This causes stomach pain and watery poop (diarrhea). These pouches are called diverticula. They form in people who have a condition called diverticulosis. Follow these instructions at home: Medicines  Take over-the-counter and prescription medicines only as told by your doctor. These include: ? Antibiotics. ? Pain medicines. ? Fiber pills. ? Probiotics. ? Stool softeners.  Do not drive or use heavy machinery while taking prescription pain medicine.  If you were prescribed an antibiotic, take it as told. Do not stop taking it even if you feel better. General instructions  Follow a diet as told by your doctor.  When you feel better, your doctor may tell you to change your diet. You may need to eat a lot of fiber. Fiber makes it easier to poop (have bowel movements). Healthy foods with fiber include: ? Berries. ? Beans. ? Lentils. ? Green vegetables.  Exercise 3 or more times a week. Aim for 30 minutes each time. Exercise enough to sweat and make your heart beat faster.  Keep all follow-up visits as told. This is important. You may need to have an exam of the large intestine. This is called a colonoscopy. Contact a doctor if:  Your pain does not get better.  You have a hard time eating or drinking.  You are not pooping like normal. Get help right away if:  Your pain gets worse.  Your problems do not get better.  Your problems get worse very fast.  You have a fever.  You throw up (vomit) more than one time.  You have poop that  is: ? Bloody. ? Black. ? Tarry. Summary  Diverticulitis is when small pockets in your large intestine (colon) get infected or swollen.  Take medicines only as told by your doctor.  Follow a diet as told by your doctor. This information is not intended to replace advice given to you by your health care provider. Make sure you discuss any questions you have with your health care provider. Document Released: 03/11/2008 Document Revised: 10/10/2016 Document Reviewed: 10/10/2016 Elsevier Interactive Patient Education  2017 [employer].

## 2018-03-25 ENCOUNTER — Encounter: Payer: Self-pay | Admitting: Gastroenterology

## 2018-04-04 NOTE — Discharge Summary (Signed)
Physician Discharge Summary  Patient ID: Omar Turner MRN: [id number] DOB/AGE: [birth year] 47 y.o.  Admit date: 03/15/2018 Discharge date: 03/20/2018  Admission Diagnoses:  Discharge Diagnoses:  Active Problems:   Diverticulitis of large intestine with perforation without abscess or bleeding   Discharged Condition: good  Hospital Course: 47 y.o. male presented to [facility] ED for LLQ abdominal pain and at least one episode of blood per rectum. Workup was found to be significant for CT imaging demonstrating acute sigmoid colonic diverticulitis without abscess. Patient was admitted to surgical service for treatment with bowel rest and IV antibiotics.  Patient's pain improved/resolved and advancement of patient's diet and ambulation were well-tolerated. The remainder of patient's hospital course was essentially unremarkable, and discharge planning was initiated accordingly with patient safely able to be discharged home with appropriate discharge instructions, antibiotics, and outpatient surgical follow-up after all of his and his wife's questions were answered to their expressed satisfaction.  Consults: None  Significant Diagnostic Studies: radiology: CT scan: sigmoid colonic diverticulitis without abscess  Treatments: IV hydration and antibiotics: Zosyn (following an initial dose of ceftriaxone and metronidazole in ED, each x1)  Discharge Exam: Blood pressure (!) 139/93, pulse 69, temperature 97.7 F (36.5 C), temperature source Oral, resp. rate 18, height 5\' 11"  (1.803 m), weight 230 lb (104.3 kg), SpO2 99 %. General appearance: alert, cooperative and no distress GI: soft, non-tender; bowel sounds normal; no masses,  no organomegaly  Disposition:    Allergies as of 03/20/2018      Reactions   Chantix [varenicline]    Hydrocodone-homatropine    Itching; sweating, n/v   Lexapro [escitalopram]          Medication List    TAKE these medications   amLODipine 5 MG tablet Commonly  known as:  NORVASC Take 5 mg by mouth daily.   aspirin EC 81 MG tablet Take 1 tablet (81 mg total) by mouth daily.   nitroGLYCERIN 0.4 MG SL tablet Commonly known as:  NITROSTAT Place 1 tablet (0.4 mg total) under the tongue every 5 (five) minutes as needed.   pantoprazole 40 MG tablet Commonly known as:  PROTONIX Take 40 mg by mouth daily.     ASK your doctor about these medications   amoxicillin-clavulanate 875-125 MG tablet Commonly known as:  AUGMENTIN Take 1 tablet by mouth 2 (two) times daily for 10 days. Ask about: Should I take this medication?      Follow-up Information    Jules Husbands, MD. Go on 03/23/2018.   Specialty:  General Surgery Why:  @4pm  Contact information: [place] [address] [phone]           Signed: Vickie Epley 04/04/2018, 1:50 PM

## 2018-04-29 ENCOUNTER — Encounter: Payer: Self-pay | Admitting: Gastroenterology

## 2018-04-29 ENCOUNTER — Other Ambulatory Visit
Admission: RE | Admit: 2018-04-29 | Discharge: 2018-04-29 | Disposition: A | Discharge status: Home / Self Care | Payer: BLUE CROSS/BLUE SHIELD | Source: Ambulatory Visit | Attending: Gastroenterology | Admitting: Gastroenterology

## 2018-04-29 ENCOUNTER — Other Ambulatory Visit: Payer: Self-pay

## 2018-04-29 ENCOUNTER — Ambulatory Visit: Payer: BLUE CROSS/BLUE SHIELD | Admitting: Gastroenterology

## 2018-04-29 VITALS — BP 122/88 | HR 93 | Ht 71.0 in | Wt 222.6 lb

## 2018-04-29 DIAGNOSIS — K625 Hemorrhage of anus and rectum: Secondary | ICD-10-CM | POA: Diagnosis not present

## 2018-04-29 DIAGNOSIS — K5732 Diverticulitis of large intestine without perforation or abscess without bleeding: Secondary | ICD-10-CM | POA: Diagnosis not present

## 2018-04-29 DIAGNOSIS — K5780 Diverticulitis of intestine, part unspecified, with perforation and abscess without bleeding: Secondary | ICD-10-CM | POA: Diagnosis present

## 2018-04-29 LAB — CBC WITH DIFFERENTIAL/PLATELET
BASOS ABS: 0.2 10*3/uL — AB (ref 0–0.1)
BASOS PCT: 1 %
Eosinophils Absolute: 0.4 10*3/uL (ref 0–0.7)
Eosinophils Relative: 4 %
HEMATOCRIT: 46.6 % (ref 40.0–52.0)
Hemoglobin: 16.5 g/dL (ref 13.0–18.0)
Lymphocytes Relative: 31 %
Lymphs Abs: 3.3 10*3/uL (ref 1.0–3.6)
MCH: 33 pg (ref 26.0–34.0)
MCHC: 35.3 g/dL (ref 32.0–36.0)
MCV: 93.5 fL (ref 80.0–100.0)
MONO ABS: 0.7 10*3/uL (ref 0.2–1.0)
MONOS PCT: 7 %
NEUTROS ABS: 6.1 10*3/uL (ref 1.4–6.5)
Neutrophils Relative %: 57 %
PLATELETS: 326 10*3/uL (ref 150–440)
RBC: 4.99 MIL/uL (ref 4.40–5.90)
RDW: 14.3 % (ref 11.5–14.5)
WBC: 10.7 10*3/uL — ABNORMAL HIGH (ref 3.8–10.6)

## 2018-04-29 NOTE — Progress Notes (Signed)
Jonathon Bellows MD, MRCP(U.K) [address]  [place]  [place], [place] [address]  Main: [phone]  Fax: [phone]   Gastroenterology Consultation  Referring Provider:     Jules Husbands, MD Primary Care Physician:  Adin Hector, MD Primary Gastroenterologist:  Dr. Jonathon Bellows  Reason for Consultation:     Diverticulitis         HPI:   Omar Turner is a 47 y.o. y/o male referred for consultation & management  by Dr. Caryl Comes, Wendelyn Breslow III, MD.    He was recently admitted to the hospital with a perforated diverticulitis. Treated with antibiotics . Some rectal bleeding. Recalls last colonoscopy was 8 years back.  Ct scan 03/15/18 showed sigmoid diverticulitis and perforation.   No prior episode of diverticulitis. Still has some occasional pain. Much better since discharge. Still has blood in his stool , every time he has a bowel movement, on the toilet paper and in the toilet , varies from a small qty to a large qty, no change in the shape of his stool, little pieces of stool. Watery as well. Did have a solid bowel movement this morning . No weight loss. . No family history of colon cancer. Had taken [facility] powder- last taken 3 days back , had taken on a regular basis.     Past Medical History:  Diagnosis Date  . Anginal pain ([place])   . Anxiety   . Degenerative disc disease, cervical   . Diverticulitis large intestine   . GERD (gastroesophageal reflux disease)   . History of chest pain   . Hyperlipidemia   . Hypertension   . Sleep apnea   . Tobacco abuse     Past Surgical History:  Procedure Laterality Date  . APPENDECTOMY    . CARDIAC CATHETERIZATION Left 06/17/2016   Procedure: Left Heart Cath and Coronary Angiography;  Surgeon: Wellington Hampshire, MD;  Location: [place] [facility];  Service: Cardiovascular;  Laterality: Left;  . COLONOSCOPY    . COLONOSCOPY WITH PROPOFOL N/A 09/07/2015   Procedure: COLONOSCOPY WITH PROPOFOL;  Surgeon: Hulen Luster, MD;  Location: [facility]  ENDOSCOPY;  Service: Gastroenterology;  Laterality: N/A;  . HERNIA REPAIR      Prior to Admission medications   Medication Sig Start Date End Date Taking? Authorizing Provider  amLODipine (NORVASC) 5 MG tablet Take 5 mg by mouth daily.   Yes [provider]  nitroGLYCERIN (NITROSTAT) 0.4 MG SL tablet Place 1 tablet (0.4 mg total) under the tongue every 5 (five) minutes as needed. 06/12/16  Yes Wende Bushy, MD  pantoprazole (PROTONIX) 40 MG tablet Take 40 mg by mouth daily.   Yes [provider]  aspirin EC 81 MG tablet Take 1 tablet (81 mg total) by mouth daily. Patient not taking: Reported on 04/29/2018 06/12/16   Wende Bushy, MD    Family History  Problem Relation Age of Onset  . Hypertension Father   . Heart disease Father        CABG x 3 & valve replacement.   [name] Heart attack Father      Social History   Tobacco Use  . Smoking status: Current Every Day Smoker    Packs/day: 1.00    Years: 15.00    Pack years: 15.00  . Smokeless tobacco: Never Used  Substance Use Topics  . Alcohol use: Yes    Alcohol/week: 1.2 oz    Types: 2 Shots of liquor per week  .  Drug use: No    Allergies as of 04/29/2018 - Review Complete 04/29/2018  Allergen Reaction Noted  . Chantix [varenicline]  09/06/2015  . Hydrocodone-homatropine  09/06/2015  . Lexapro [escitalopram]  09/06/2015    Review of Systems:    All systems reviewed and negative except where noted in HPI.   Physical Exam:  BP 122/88   Pulse 93   Ht 5\' 11"  (1.803 m)   Wt 222 lb 9.6 oz (101 kg)   BMI 31.05 kg/m  No LMP for male patient. Psych:  Alert and cooperative. Normal mood and affect. General:   Alert,  Well-developed, well-nourished, pleasant and cooperative in NAD Head:  Normocephalic and atraumatic. Eyes:  Sclera clear, no icterus.   Conjunctiva pink. Ears:  Normal auditory acuity. Nose:  No deformity, discharge, or lesions. Mouth:  No deformity or lesions,oropharynx pink & moist. Neck:   Supple; no masses or thyromegaly. Lungs:  Respirations even and unlabored.  Clear throughout to auscultation.   No wheezes, crackles, or rhonchi. No acute distress. Heart:  Regular rate and rhythm; no murmurs, clicks, rubs, or gallops. Abdomen:  Normal bowel sounds.  No bruits.  Soft, non-tender and non-distended without masses, hepatosplenomegaly or hernias noted.  No guarding or rebound tenderness.    Neurologic:  Alert and oriented x3;  grossly normal neurologically. Skin:  Intact without significant lesions or rashes. No jaundice. Lymph Nodes:  No significant cervical adenopathy. Psych:  Alert and cooperative. Normal mood and affect.  Imaging Studies: No results found.  Assessment and Plan:   CHRSTOPHER Turner is a 47 y.o. y/o male has been referred for recent episode of perforated diverticulitis of the sigmoid colon treated conservatiuvely. He has been having some rectal bleeding , long term use of BC powders . He could have NSAID related colitis .   Plan  1. Stop all NSAID's 2. Check CBC 3. Colonoscopy 1st or second week of August to r./o neoplasm   I have discussed alternative options, risks & benefits,  which include, but are not limited to, bleeding, infection, perforation,respiratory complication & drug reaction.  The patient agrees with this plan & written consent will be obtained.     Follow up PRN  Dr Jonathon Bellows MD,MRCP(U.K)

## 2018-05-01 ENCOUNTER — Other Ambulatory Visit: Payer: Self-pay

## 2018-05-01 DIAGNOSIS — K625 Hemorrhage of anus and rectum: Secondary | ICD-10-CM

## 2018-05-01 DIAGNOSIS — K5732 Diverticulitis of large intestine without perforation or abscess without bleeding: Secondary | ICD-10-CM

## 2018-05-12 ENCOUNTER — Encounter
Admission: RE | Disposition: A | Discharge status: Home / Self Care | Payer: Self-pay | Source: Ambulatory Visit | Attending: Gastroenterology

## 2018-05-12 ENCOUNTER — Ambulatory Visit: Payer: BLUE CROSS/BLUE SHIELD | Admitting: Anesthesiology

## 2018-05-12 ENCOUNTER — Other Ambulatory Visit: Payer: Self-pay

## 2018-05-12 ENCOUNTER — Encounter: Payer: Self-pay | Admitting: Student

## 2018-05-12 ENCOUNTER — Ambulatory Visit
Admission: RE | Admit: 2018-05-12 | Discharge: 2018-05-12 | Disposition: A | Discharge status: Home / Self Care | Payer: BLUE CROSS/BLUE SHIELD | Source: Ambulatory Visit | Attending: Gastroenterology | Admitting: Gastroenterology

## 2018-05-12 DIAGNOSIS — Z885 Allergy status to narcotic agent status: Secondary | ICD-10-CM | POA: Insufficient documentation

## 2018-05-12 DIAGNOSIS — K5732 Diverticulitis of large intestine without perforation or abscess without bleeding: Secondary | ICD-10-CM | POA: Diagnosis present

## 2018-05-12 DIAGNOSIS — E785 Hyperlipidemia, unspecified: Secondary | ICD-10-CM | POA: Diagnosis not present

## 2018-05-12 DIAGNOSIS — D123 Benign neoplasm of transverse colon: Secondary | ICD-10-CM | POA: Diagnosis not present

## 2018-05-12 DIAGNOSIS — F17210 Nicotine dependence, cigarettes, uncomplicated: Secondary | ICD-10-CM | POA: Insufficient documentation

## 2018-05-12 DIAGNOSIS — Z888 Allergy status to other drugs, medicaments and biological substances status: Secondary | ICD-10-CM | POA: Diagnosis not present

## 2018-05-12 DIAGNOSIS — K621 Rectal polyp: Secondary | ICD-10-CM

## 2018-05-12 DIAGNOSIS — Z79899 Other long term (current) drug therapy: Secondary | ICD-10-CM | POA: Insufficient documentation

## 2018-05-12 DIAGNOSIS — D122 Benign neoplasm of ascending colon: Secondary | ICD-10-CM | POA: Insufficient documentation

## 2018-05-12 DIAGNOSIS — Z7982 Long term (current) use of aspirin: Secondary | ICD-10-CM | POA: Insufficient documentation

## 2018-05-12 DIAGNOSIS — F419 Anxiety disorder, unspecified: Secondary | ICD-10-CM | POA: Insufficient documentation

## 2018-05-12 DIAGNOSIS — K625 Hemorrhage of anus and rectum: Secondary | ICD-10-CM

## 2018-05-12 DIAGNOSIS — I10 Essential (primary) hypertension: Secondary | ICD-10-CM | POA: Insufficient documentation

## 2018-05-12 DIAGNOSIS — K219 Gastro-esophageal reflux disease without esophagitis: Secondary | ICD-10-CM | POA: Insufficient documentation

## 2018-05-12 DIAGNOSIS — G4730 Sleep apnea, unspecified: Secondary | ICD-10-CM | POA: Diagnosis not present

## 2018-05-12 DIAGNOSIS — J449 Chronic obstructive pulmonary disease, unspecified: Secondary | ICD-10-CM | POA: Insufficient documentation

## 2018-05-12 DIAGNOSIS — D125 Benign neoplasm of sigmoid colon: Secondary | ICD-10-CM | POA: Diagnosis not present

## 2018-05-12 HISTORY — PX: COLONOSCOPY WITH PROPOFOL: SHX5780

## 2018-05-12 SURGERY — COLONOSCOPY WITH PROPOFOL
Anesthesia: General

## 2018-05-12 MED ORDER — PROPOFOL 10 MG/ML IV BOLUS
INTRAVENOUS | Status: AC
Start: 2018-05-12 — End: ?
  Filled 2018-05-12: qty 20

## 2018-05-12 MED ORDER — FENTANYL CITRATE (PF) 100 MCG/2ML IJ SOLN
INTRAMUSCULAR | Status: AC
  Filled 2018-05-12: qty 2

## 2018-05-12 MED ORDER — PROPOFOL 500 MG/50ML IV EMUL
INTRAVENOUS | Status: AC
  Filled 2018-05-12: qty 50

## 2018-05-12 MED ORDER — PROPOFOL 10 MG/ML IV BOLUS
INTRAVENOUS | Status: DC | PRN
  Administered 2018-05-12: 50 mg via INTRAVENOUS
  Administered 2018-05-12: 100 mg via INTRAVENOUS

## 2018-05-12 MED ORDER — GLYCOPYRROLATE 0.2 MG/ML IJ SOLN
INTRAMUSCULAR | Status: AC
  Filled 2018-05-12: qty 1

## 2018-05-12 MED ORDER — PROPOFOL 500 MG/50ML IV EMUL
INTRAVENOUS | Status: DC | PRN
  Administered 2018-05-12: 200 ug/kg/min via INTRAVENOUS

## 2018-05-12 MED ORDER — PROPOFOL 10 MG/ML IV BOLUS
INTRAVENOUS | Status: AC
  Filled 2018-05-12: qty 20

## 2018-05-12 MED ORDER — MIDAZOLAM HCL 5 MG/5ML IJ SOLN
INTRAMUSCULAR | Status: DC | PRN
  Administered 2018-05-12 (×2): 1 mg via INTRAVENOUS

## 2018-05-12 MED ORDER — LIDOCAINE 2% (20 MG/ML) 5 ML SYRINGE
INTRAMUSCULAR | Status: DC | PRN
  Administered 2018-05-12: 40 mg via INTRAVENOUS

## 2018-05-12 MED ORDER — SODIUM CHLORIDE 0.9 % IV SOLN
INTRAVENOUS | Status: DC
  Administered 2018-05-12: 08:00:00 via INTRAVENOUS

## 2018-05-12 MED ORDER — FENTANYL CITRATE (PF) 100 MCG/2ML IJ SOLN
INTRAMUSCULAR | Status: DC | PRN
  Administered 2018-05-12 (×2): 50 ug via INTRAVENOUS

## 2018-05-12 MED ORDER — MIDAZOLAM HCL 2 MG/2ML IJ SOLN
INTRAMUSCULAR | Status: AC
  Filled 2018-05-12: qty 2

## 2018-05-12 MED ORDER — PHENYLEPHRINE HCL 10 MG/ML IJ SOLN
INTRAMUSCULAR | Status: AC
  Filled 2018-05-12: qty 1

## 2018-05-12 MED ORDER — LIDOCAINE HCL (PF) 2 % IJ SOLN
INTRAMUSCULAR | Status: AC
  Filled 2018-05-12: qty 10

## 2018-05-12 NOTE — Op Note (Signed)
[facility] Gastroenterology Patient Name: Omar Turner Procedure Date: 05/12/2018 7:29 AM MRN: [id number] Account #: [id number] Date of Birth: 1970-12-31 Admit Type: Outpatient Age: 47 Room: [facility] ENDO ROOM 1 Gender: Male Note Status: Finalized Procedure:            Colonoscopy Indications:          Follow-up of diverticulitis Providers:            Jonathon Bellows MD, MD Referring MD:         Ramonita Lab, MD (Referring MD) Medicines:            Monitored Anesthesia Care Complications:        No immediate complications. Procedure:            Pre-Anesthesia Assessment:                       - Prior to the procedure, a History and Physical was                        performed, and patient medications, allergies and                        sensitivities were reviewed. The patient's tolerance of                        previous anesthesia was reviewed.                       - The risks and benefits of the procedure and the                        sedation options and risks were discussed with the                        patient. All questions were answered and informed                        consent was obtained.                       - ASA Grade Assessment: II - A patient with mild                        systemic disease.                       After obtaining informed consent, the colonoscope was                        passed under direct vision. Throughout the procedure,                        the patient's blood pressure, pulse, and oxygen                        saturations were monitored continuously. The                        Colonoscope was introduced through the anus and                        advanced  to the the cecum, identified by the                        appendiceal orifice, IC valve and transillumination.                        The colonoscopy was performed with ease. The patient                        tolerated the procedure well. The quality of the bowel                    preparation was good. Findings:      The perianal and digital rectal examinations were normal.      Two sessile polyps were found in the ascending colon. The polyps were 3       to 4 mm in size. These polyps were removed with a cold biopsy forceps.       Resection and retrieval were complete.      Two sessile polyps were found in the transverse colon and ascending       colon. The polyps were 4 to 6 mm in size. These polyps were removed with       a cold snare. Resection and retrieval were complete.      Four sessile polyps were found in the rectum. The polyps were 5 to 7 mm       in size. These polyps were removed with a cold snare. Resection and       retrieval were complete.      Two sessile polyps were found in the sigmoid colon. The polyps were 4 to       6 mm in size. These polyps were removed with a cold snare. Resection and       retrieval were complete.      Three sessile polyps were found in the sigmoid colon. The polyps were 8       to 10 mm in size. These polyps were removed with a hot snare. Resection       and retrieval were complete.      Multiple small-mouthed diverticula were found in the sigmoid colon.      The exam was otherwise without abnormality on direct and retroflexion       views. Impression:           - Two 3 to 4 mm polyps in the ascending colon, removed                        with a cold biopsy forceps. Resected and retrieved.                       - Two 4 to 6 mm polyps in the transverse colon and in                        the ascending colon, removed with a cold snare.                        Resected and retrieved.                       - Four 5 to 7 mm polyps in the rectum, removed with a  cold snare. Resected and retrieved.                       - Two 4 to 6 mm polyps in the sigmoid colon, removed                        with a cold snare. Resected and retrieved.                       - Three 8 to 10 mm polyps in the  sigmoid colon, removed                        with a hot snare. Resected and retrieved.                       - Diverticulosis in the sigmoid colon.                       - The examination was otherwise normal on direct and                        retroflexion views. Recommendation:       - Discharge patient to home (with escort).                       - Resume previous diet.                       - Continue present medications.                       - Await pathology results.                       - Repeat colonoscopy in 3 years for surveillance. Procedure Code(s):    --- Professional ---                       [id number], Colonoscopy, flexible; with removal of tumor(s),                        polyp(s), or other lesion(s) by snare technique                       45380, 45, Colonoscopy, flexible; with biopsy, single                        or multiple Diagnosis Code(s):    --- Professional ---                       D12.3, Benign neoplasm of transverse colon (hepatic                        flexure or splenic flexure)                       D12.2, Benign neoplasm of ascending colon                       D12.5, Benign neoplasm of sigmoid colon  K62.1, Rectal polyp                       K57.32, Diverticulitis of large intestine without                        perforation or abscess without bleeding                       K57.30, Diverticulosis of large intestine without                        perforation or abscess without bleeding CPT copyright 2017 American Medical Association. All rights reserved. The codes documented in this report are preliminary and upon coder review may  be revised to meet current compliance requirements. Jonathon Bellows, MD Jonathon Bellows MD, MD 05/12/2018 8:56:21 AM This report has been signed electronically. Number of Addenda: 0 Note Initiated On: 05/12/2018 7:29 AM Scope Withdrawal Time: 0 hours 24 minutes 7 seconds  Total Procedure Duration: 0 hours 26 minutes  34 seconds       [facility]

## 2018-05-12 NOTE — Transfer of Care (Signed)
Immediate Anesthesia Transfer of Care Note  Patient: Omar Turner  Procedure(s) Performed: COLONOSCOPY WITH PROPOFOL (N/A )  Patient Location: PACU and Endoscopy Unit  Anesthesia Type:General  Level of Consciousness: awake, drowsy and patient cooperative  Airway & Oxygen Therapy: Patient Spontanous Breathing and Patient connected to nasal cannula oxygen  Post-op Assessment: Report given to RN and Post -op Vital signs reviewed and stable  Post vital signs: Reviewed and stable  Last Vitals:  Vitals Value Taken Time  BP    Temp    Pulse    Resp    SpO2      Last Pain:  Vitals:   05/12/18 0739  TempSrc: Tympanic  PainSc: 0-No pain         Complications: No apparent anesthesia complications

## 2018-05-12 NOTE — Anesthesia Post-op Follow-up Note (Signed)
Anesthesia QCDR form completed.        

## 2018-05-12 NOTE — Anesthesia Preprocedure Evaluation (Signed)
Anesthesia Evaluation  Patient identified by MRN, date of birth, ID band Patient awake    Reviewed: Allergy & Precautions, NPO status , Patient's Chart, lab work & pertinent test results  History of Anesthesia Complications Negative for: history of anesthetic complications  Airway Mallampati: II       Dental  (+) Chipped, Teeth Intact, Dental Advidsory Given   Pulmonary neg shortness of breath, sleep apnea , COPD, neg recent URI, Current Smoker,           Cardiovascular Exercise Tolerance: Good hypertension, Pt. on medications (-) angina(-) CAD, (-) Past MI, (-) Cardiac Stents and (-) CABG (-) dysrhythmias (-) Valvular Problems/Murmurs Rhythm:Regular Rate:Normal     Neuro/Psych    GI/Hepatic Neg liver ROS, GERD  ,  Endo/Other  negative endocrine ROS  Renal/GU negative Renal ROS     Musculoskeletal   Abdominal Normal abdominal exam  (+)   Peds  Hematology negative hematology ROS (+)   Anesthesia Other Findings Past Medical History: No date: Anginal pain ([place]) No date: Anxiety No date: Degenerative disc disease, cervical No date: Diverticulitis large intestine No date: GERD (gastroesophageal reflux disease) No date: History of chest pain No date: Hyperlipidemia No date: Hypertension No date: Sleep apnea No date: Tobacco abuse   Reproductive/Obstetrics                             Anesthesia Physical  Anesthesia Plan  ASA: II  Anesthesia Plan: General   Post-op Pain Management:    Induction: Intravenous  PONV Risk Score and Plan: 1 and Propofol infusion and TIVA  Airway Management Planned: Nasal Cannula  Additional Equipment:   Intra-op Plan:   Post-operative Plan:   Informed Consent: I have reviewed the patients History and Physical, chart, labs and discussed the procedure including the risks, benefits and alternatives for the proposed anesthesia with the patient or  authorized representative who has indicated his/her understanding and acceptance.     Plan Discussed with: CRNA  Anesthesia Plan Comments:         Anesthesia Quick Evaluation

## 2018-05-12 NOTE — H&P (Signed)
Jonathon Bellows, MD [address] [place], [place], [place], [address] [address], [place], [place], [place], [address] Phone: [phone]  Fax: [phone]  Primary Care Physician:  Adin Hector, MD   Pre-Procedure History & Physical: HPI:  Omar Turner is a 47 y.o. male is here for an colonoscopy.   Past Medical History:  Diagnosis Date  . Anginal pain ([place])   . Anxiety   . Degenerative disc disease, cervical   . Diverticulitis large intestine   . GERD (gastroesophageal reflux disease)   . History of chest pain   . Hyperlipidemia   . Hypertension   . Sleep apnea   . Tobacco abuse     Past Surgical History:  Procedure Laterality Date  . APPENDECTOMY    . CARDIAC CATHETERIZATION Left 06/17/2016   Procedure: Left Heart Cath and Coronary Angiography;  Surgeon: Wellington Hampshire, MD;  Location: [place] [facility];  Service: Cardiovascular;  Laterality: Left;  . COLONOSCOPY    . COLONOSCOPY WITH PROPOFOL N/A 09/07/2015   Procedure: COLONOSCOPY WITH PROPOFOL;  Surgeon: Hulen Luster, MD;  Location: [facility] ENDOSCOPY;  Service: Gastroenterology;  Laterality: N/A;  . HERNIA REPAIR      Prior to Admission medications   Medication Sig Start Date End Date Taking? Authorizing Provider  amLODipine (NORVASC) 5 MG tablet Take 5 mg by mouth daily.   Yes [provider]  aspirin EC 81 MG tablet Take 1 tablet (81 mg total) by mouth daily. 06/12/16  Yes Wende Bushy, MD  pantoprazole (PROTONIX) 40 MG tablet Take 40 mg by mouth daily.   Yes [provider]  nitroGLYCERIN (NITROSTAT) 0.4 MG SL tablet Place 1 tablet (0.4 mg total) under the tongue every 5 (five) minutes as needed. 06/12/16   Wende Bushy, MD    Allergies as of 05/01/2018 - Review Complete 04/29/2018  Allergen Reaction Noted  . Chantix [varenicline]  09/06/2015  . Hydrocodone-homatropine  09/06/2015  . Lexapro [escitalopram]  09/06/2015    Family History  Problem Relation Age of Onset  .  Hypertension Father   . Heart disease Father        CABG x 3 & valve replacement.   [name] Heart attack Father     Social History   Socioeconomic History  . Marital status: Single    Spouse name: Not on file  . Number of children: Not on file  . Years of education: Not on file  . Highest education level: Not on file  Occupational History  . Not on file  Social Needs  . Financial resource strain: Not on file  . Food insecurity:    Worry: Not on file    Inability: Not on file  . Transportation needs:    Medical: Not on file    Non-medical: Not on file  Tobacco Use  . Smoking status: Current Every Day Smoker    Packs/day: 1.50    Years: 15.00    Pack years: 22.50  . Smokeless tobacco: Never Used  Substance and Sexual Activity  . Alcohol use: Yes    Alcohol/week: 1.2 oz    Types: 2 Shots of liquor per week    Comment: per day  . Drug use: No  . Sexual activity: Not on file  Lifestyle  . Physical activity:    Days per week: Not on file    Minutes per session: Not on file  . Stress: Not on file  Relationships  . Social  connections:    Talks on phone: Not on file    Gets together: Not on file    Attends religious service: Not on file    Active member of club or organization: Not on file    Attends meetings of clubs or organizations: Not on file    Relationship status: Not on file  . Intimate partner violence:    Fear of current or ex partner: Not on file    Emotionally abused: Not on file    Physically abused: Not on file    Forced sexual activity: Not on file  Other Topics Concern  . Not on file  Social History Narrative  . Not on file    Review of Systems: See HPI, otherwise negative ROS  Physical Exam: BP (!) 159/104   Pulse 91   Temp (!) 96.5 F (35.8 C) (Tympanic)   Resp 17   Ht 5\' 11"  (1.803 m)   Wt 230 lb (104.3 kg)   SpO2 100%   BMI 32.08 kg/m  General:   Alert,  pleasant and cooperative in NAD Head:  Normocephalic and atraumatic. Neck:   Supple; no masses or thyromegaly. Lungs:  Clear throughout to auscultation, normal respiratory effort.    Heart:  +S1, +S2, Regular rate and rhythm, No edema. Abdomen:  Soft, nontender and nondistended. Normal bowel sounds, without guarding, and without rebound.   Neurologic:  Alert and  oriented x4;  grossly normal neurologically.  Impression/Plan: Omar Turner is here for an colonoscopy to be performed for diverticulitis.  Risks, benefits, limitations, and alternatives regarding  colonoscopy have been reviewed with the patient.  Questions have been answered.  All parties agreeable.   Jonathon Bellows, MD  05/12/2018, 8:09 AM

## 2018-05-12 NOTE — Anesthesia Postprocedure Evaluation (Signed)
Anesthesia Post Note  Patient: DORANCE SPINK  Procedure(s) Performed: COLONOSCOPY WITH PROPOFOL (N/A )  Patient location during evaluation: Endoscopy Anesthesia Type: General Level of consciousness: awake and alert Pain management: pain level controlled Vital Signs Assessment: post-procedure vital signs reviewed and stable Respiratory status: spontaneous breathing, nonlabored ventilation, respiratory function stable and patient connected to nasal cannula oxygen Cardiovascular status: blood pressure returned to baseline and stable Postop Assessment: no apparent nausea or vomiting Anesthetic complications: no     Last Vitals:  Vitals:   05/12/18 0920 05/12/18 0930  BP: 130/87 122/88  Pulse: 73 75  Resp: 16 13  Temp:    SpO2: 98% 100%    Last Pain:  Vitals:   05/12/18 0930  TempSrc:   PainSc: 0-No pain                 Martha Clan

## 2018-05-13 ENCOUNTER — Encounter: Payer: Self-pay | Admitting: Gastroenterology

## 2018-05-13 LAB — SURGICAL PATHOLOGY

## 2018-05-14 ENCOUNTER — Encounter: Payer: Self-pay | Admitting: Gastroenterology

## 2018-05-18 ENCOUNTER — Ambulatory Visit: Payer: Self-pay | Admitting: Surgery

## 2018-05-22 ENCOUNTER — Telehealth: Payer: Self-pay

## 2018-05-22 NOTE — Telephone Encounter (Signed)
Patient was contacted but was not able to leave a voicemail.   Omar Turner, can you please contact patient to reschedule his appointment since he no showed with Dr. Dahlia Byes on his last one. Thank you.

## 2018-05-27 ENCOUNTER — Encounter: Payer: Self-pay | Admitting: Surgery

## 2018-05-27 NOTE — Telephone Encounter (Signed)
Letter has been mailed to patient, for the patient to contact our office.

## 2018-07-30 ENCOUNTER — Encounter: Payer: Self-pay | Admitting: *Deleted

## 2018-12-11 ENCOUNTER — Emergency Department (HOSPITAL_COMMUNITY)
Admission: EM | Admit: 2018-12-11 | Discharge: 2018-12-12 | Disposition: A | Discharge status: Home / Self Care | Payer: Self-pay | Attending: Emergency Medicine | Admitting: Emergency Medicine

## 2018-12-11 ENCOUNTER — Emergency Department (HOSPITAL_COMMUNITY): Payer: Self-pay

## 2018-12-11 ENCOUNTER — Other Ambulatory Visit: Payer: Self-pay

## 2018-12-11 ENCOUNTER — Encounter (HOSPITAL_COMMUNITY): Payer: Self-pay

## 2018-12-11 DIAGNOSIS — I10 Essential (primary) hypertension: Secondary | ICD-10-CM | POA: Insufficient documentation

## 2018-12-11 DIAGNOSIS — Z7982 Long term (current) use of aspirin: Secondary | ICD-10-CM | POA: Insufficient documentation

## 2018-12-11 DIAGNOSIS — R002 Palpitations: Secondary | ICD-10-CM | POA: Insufficient documentation

## 2018-12-11 DIAGNOSIS — B349 Viral infection, unspecified: Secondary | ICD-10-CM | POA: Insufficient documentation

## 2018-12-11 DIAGNOSIS — E785 Hyperlipidemia, unspecified: Secondary | ICD-10-CM | POA: Insufficient documentation

## 2018-12-11 DIAGNOSIS — Z79899 Other long term (current) drug therapy: Secondary | ICD-10-CM | POA: Insufficient documentation

## 2018-12-11 DIAGNOSIS — F17200 Nicotine dependence, unspecified, uncomplicated: Secondary | ICD-10-CM | POA: Insufficient documentation

## 2018-12-11 LAB — BASIC METABOLIC PANEL
Anion gap: 9 (ref 5–15)
BUN: 6 mg/dL (ref 6–20)
CALCIUM: 9.2 mg/dL (ref 8.9–10.3)
CO2: 23 mmol/L (ref 22–32)
CREATININE: 0.75 mg/dL (ref 0.61–1.24)
Chloride: 107 mmol/L (ref 98–111)
Glucose, Bld: 103 mg/dL — ABNORMAL HIGH (ref 70–99)
Potassium: 3.8 mmol/L (ref 3.5–5.1)
SODIUM: 139 mmol/L (ref 135–145)

## 2018-12-11 LAB — CBC
HEMATOCRIT: 46.7 % (ref 39.0–52.0)
Hemoglobin: 15.9 g/dL (ref 13.0–17.0)
MCH: 30.2 pg (ref 26.0–34.0)
MCHC: 34 g/dL (ref 30.0–36.0)
MCV: 88.6 fL (ref 80.0–100.0)
NRBC: 0 % (ref 0.0–0.2)
PLATELETS: 355 10*3/uL (ref 150–400)
RBC: 5.27 MIL/uL (ref 4.22–5.81)
RDW: 13.4 % (ref 11.5–15.5)
WBC: 11.6 10*3/uL — AB (ref 4.0–10.5)

## 2018-12-11 LAB — I-STAT TROPONIN, ED: TROPONIN I, POC: 0.01 ng/mL (ref 0.00–0.08)

## 2018-12-11 MED ORDER — SODIUM CHLORIDE 0.9% FLUSH
3.0000 mL | Freq: Once | INTRAVENOUS | Status: AC
  Administered 2018-12-12: 3 mL via INTRAVENOUS

## 2018-12-11 NOTE — ED Triage Notes (Signed)
Pt states he has been feeling sick all week with URI type symptoms but recently developed palpitations. No distress noted in triage.

## 2018-12-11 NOTE — ED Notes (Signed)
Pt remains in waiting room. Updated on wait for treatment room. 

## 2018-12-12 LAB — I-STAT TROPONIN, ED: TROPONIN I, POC: 0.01 ng/mL (ref 0.00–0.08)

## 2018-12-12 LAB — HEPATIC FUNCTION PANEL
ALT: 28 U/L (ref 0–44)
AST: 32 U/L (ref 15–41)
Albumin: 4.2 g/dL (ref 3.5–5.0)
Alkaline Phosphatase: 71 U/L (ref 38–126)
Bilirubin, Direct: 0.2 mg/dL (ref 0.0–0.2)
Indirect Bilirubin: 0.7 mg/dL (ref 0.3–0.9)
Total Bilirubin: 0.9 mg/dL (ref 0.3–1.2)
Total Protein: 7.3 g/dL (ref 6.5–8.1)

## 2018-12-12 LAB — D-DIMER, QUANTITATIVE (NOT AT ARMC): D DIMER QUANT: 0.46 ug{FEU}/mL (ref 0.00–0.50)

## 2018-12-12 LAB — LIPASE, BLOOD: Lipase: 24 U/L (ref 11–51)

## 2018-12-12 LAB — INFLUENZA PANEL BY PCR (TYPE A & B)
INFLBPCR: NEGATIVE
Influenza A By PCR: NEGATIVE

## 2018-12-12 MED ORDER — SODIUM CHLORIDE 0.9 % IV BOLUS
1000.0000 mL | Freq: Once | INTRAVENOUS | Status: AC
  Administered 2018-12-12: 1000 mL via INTRAVENOUS

## 2018-12-12 MED ORDER — DIPHENHYDRAMINE HCL 50 MG/ML IJ SOLN
25.0000 mg | Freq: Once | INTRAMUSCULAR | Status: AC
  Administered 2018-12-12: 25 mg via INTRAVENOUS
  Filled 2018-12-12: qty 1

## 2018-12-12 MED ORDER — METOCLOPRAMIDE HCL 5 MG/ML IJ SOLN
10.0000 mg | Freq: Once | INTRAMUSCULAR | Status: AC
  Administered 2018-12-12: 10 mg via INTRAVENOUS
  Filled 2018-12-12: qty 2

## 2018-12-12 MED ORDER — KETOROLAC TROMETHAMINE 30 MG/ML IJ SOLN
30.0000 mg | Freq: Once | INTRAMUSCULAR | Status: AC
  Administered 2018-12-12: 30 mg via INTRAVENOUS
  Filled 2018-12-12: qty 1

## 2018-12-12 MED ORDER — ONDANSETRON 4 MG PO TBDP: 4.0000 mg | ORAL_TABLET | Freq: Three times a day (TID) | ORAL | 0 refills | Status: DC | PRN

## 2018-12-12 NOTE — ED Notes (Signed)
Pt ambulated and handled the fluid well

## 2018-12-12 NOTE — ED Provider Notes (Signed)
[place] EMERGENCY DEPARTMENT Provider Note   CSN: [id number] Arrival date & time: 12/11/18  1839    History   Chief Complaint Chief Complaint  Patient presents with  . Fatigue  . Palpitations    HPI Omar Turner is a 48 y.o. male.     Patient reports feeling sick for the past 5 days with body aches, congestion, nonproductive cough, intermittent nausea and vomiting.  States he had 2 episodes of vomiting over the past 2 days.  No diarrhea or fever.  States he became concerned tonight because he has had increasing palpitations and sensation of "heart pounding" for the past 2 days it has been fairly constant.  Intermittently has episodes of chest pain that lasted for a few minutes then resolved.  He is not have any chest pain currently.  His cough is nonproductive.  He is not had a fever that he knows of at home.  But he has not checked his temperature.  No recent travel or sick contacts.  He denies any abdominal pain.  He endorses diffuse gradual onset headache with body aches and chills.  States he has been eating and drinking well.  His vomiting is not starting to improve.  He is not have any chest pain currently.  He does feel somewhat lightheaded when he stands up.  History of hypertension, diverticulitis, GERD and anxiety.  The history is provided by the patient.  Palpitations  Associated symptoms: cough, dizziness, nausea, vomiting and weakness   Associated symptoms: no chest pain     Past Medical History:  Diagnosis Date  . Anginal pain ([place])   . Anxiety   . Degenerative disc disease, cervical   . Diverticulitis large intestine   . GERD (gastroesophageal reflux disease)   . History of chest pain   . Hyperlipidemia   . Hypertension   . Sleep apnea   . Tobacco abuse     Patient Active Problem List   Diagnosis Date Noted  . DDD (degenerative disc disease), cervical 03/20/2018  . Hypertension 03/20/2018  . Sleep apnea 03/20/2018  .  Diverticulitis of large intestine with perforation without abscess or bleeding 03/15/2018  . Gastroesophageal reflux disease without esophagitis 04/03/2017  . History of chest pain 04/03/2017  . Other hyperlipidemia 04/03/2017  . Precordial pain   . Tobacco abuse 08/21/2015  . Anxiety state 02/15/2015    Past Surgical History:  Procedure Laterality Date  . APPENDECTOMY    . CARDIAC CATHETERIZATION Left 06/17/2016   Procedure: Left Heart Cath and Coronary Angiography;  Surgeon: Wellington Hampshire, MD;  Location: [facility];  Service: Cardiovascular;  Laterality: Left;  . COLONOSCOPY    . COLONOSCOPY WITH PROPOFOL N/A 09/07/2015   Procedure: COLONOSCOPY WITH PROPOFOL;  Surgeon: Hulen Luster, MD;  Location: [facility] ENDOSCOPY;  Service: Gastroenterology;  Laterality: N/A;  . COLONOSCOPY WITH PROPOFOL N/A 05/12/2018   Procedure: COLONOSCOPY WITH PROPOFOL;  Surgeon: Jonathon Bellows, MD;  Location: [facility] ENDOSCOPY;  Service: Gastroenterology;  Laterality: N/A;  . HERNIA REPAIR          Home Medications    Prior to Admission medications   Medication Sig Start Date End Date Taking? Authorizing Provider  amLODipine (NORVASC) 5 MG tablet Take 5 mg by mouth daily.    [provider]  aspirin EC 81 MG tablet Take 1 tablet (81 mg total) by mouth daily. 06/12/16   Wende Bushy, MD  nitroGLYCERIN (NITROSTAT) 0.4 MG SL tablet Place 1 tablet (0.4  mg total) under the tongue every 5 (five) minutes as needed. 06/12/16   Wende Bushy, MD  pantoprazole (PROTONIX) 40 MG tablet Take 40 mg by mouth daily.    [provider]    Family History Family History  Problem Relation Age of Onset  . Hypertension Father   . Heart disease Father        CABG x 3 & valve replacement.   [name] Heart attack Father     Social History Social History   Tobacco Use  . Smoking status: Current Every Day Smoker    Packs/day: 1.50    Years: 15.00    Pack years: 22.50  . Smokeless tobacco: Never Used    Substance Use Topics  . Alcohol use: Yes    Alcohol/week: 2.0 standard drinks    Types: 2 Shots of liquor per week    Comment: per day  . Drug use: No     Allergies   Chantix [varenicline]; Hydrocodone-homatropine; and Lexapro [escitalopram]   Review of Systems Review of Systems  Constitutional: Positive for activity change, appetite change and fatigue.  HENT: Positive for congestion.   Eyes: Negative for visual disturbance.  Respiratory: Positive for cough and chest tightness.   Cardiovascular: Positive for palpitations. Negative for chest pain.  Gastrointestinal: Positive for nausea and vomiting. Negative for abdominal pain.  Genitourinary: Negative for dysuria, flank pain and hematuria.  Musculoskeletal: Positive for arthralgias and myalgias.  Skin: Negative for rash.  Neurological: Positive for dizziness, weakness, light-headedness and headaches.    all other systems are negative except as noted in the HPI and PMH.    Physical Exam Updated Vital Signs BP (!) 163/108 (BP Location: Left Arm)   Pulse 95   Temp 98.3 F (36.8 C) (Oral)   Resp 16   SpO2 97%   Physical Exam Vitals signs and nursing note reviewed.  Constitutional:      General: He is not in acute distress.    Appearance: He is well-developed.  HENT:     Head: Normocephalic and atraumatic.     Mouth/Throat:     Mouth: Mucous membranes are dry.     Pharynx: No oropharyngeal exudate.  Eyes:     Conjunctiva/sclera: Conjunctivae normal.     Pupils: Pupils are equal, round, and reactive to light.  Neck:     Musculoskeletal: Normal range of motion and neck supple.     Comments: No meningismus. Cardiovascular:     Rate and Rhythm: Normal rate and regular rhythm.     Heart sounds: Normal heart sounds. No murmur.  Pulmonary:     Effort: Pulmonary effort is normal. No respiratory distress.     Breath sounds: Normal breath sounds.  Chest:     Chest wall: No tenderness.  Abdominal:     Palpations:  Abdomen is soft.     Tenderness: There is no abdominal tenderness. There is no guarding or rebound.  Musculoskeletal: Normal range of motion.        General: No tenderness.  Skin:    General: Skin is warm.     Capillary Refill: Capillary refill takes less than 2 seconds.  Neurological:     General: No focal deficit present.     Mental Status: He is alert and oriented to person, place, and time.     Cranial Nerves: No cranial nerve deficit.     Motor: No abnormal muscle tone.     Coordination: Coordination normal.     Comments: No ataxia on  finger to nose bilaterally. No pronator drift. 5/5 strength throughout. CN 2-12 intact.Equal grip strength. Sensation intact.   Psychiatric:        Behavior: Behavior normal.      ED Treatments / Results  Labs (all labs ordered are listed, but only abnormal results are displayed) Labs Reviewed  BASIC METABOLIC PANEL - Abnormal; Notable for the following components:      Result Value   Glucose, Bld 103 (*)    All other components within normal limits  CBC - Abnormal; Notable for the following components:   WBC 11.6 (*)    All other components within normal limits  INFLUENZA PANEL BY PCR (TYPE A & B)  HEPATIC FUNCTION PANEL  LIPASE, BLOOD  D-DIMER, QUANTITATIVE (NOT AT [facility])  I-STAT TROPONIN, ED  I-STAT TROPONIN, ED    EKG EKG Interpretation  Date/Time:  Friday December 11 2018 18:52:51 EST Ventricular Rate:  103 PR Interval:  156 QRS Duration: 84 QT Interval:  362 QTC Calculation: 474 R Axis:   -7 Text Interpretation:  Sinus tachycardia Otherwise normal ECG No significant change was found Confirmed by Ezequiel Essex [phone]) on 12/12/2018 12:27:36 AM   Radiology Dg Chest 2 View  Result Date: 12/11/2018 CLINICAL DATA:  Dry cough, shortness of breath and left chest pain for the past week. Smoker. EXAM: CHEST - 2 VIEW COMPARISON:  03/15/2018. FINDINGS: Normal sized heart. Clear lungs. Mild diffuse peribronchial thickening with mild  progression. Unremarkable bones. IMPRESSION: Mild bronchitic changes with mild progression. Electronically Signed   By: Claudie Revering M.D.   On: 12/11/2018 19:24    Procedures Procedures (including critical care time)  Medications Ordered in ED Medications  sodium chloride flush (NS) 0.9 % injection 3 mL (has no administration in time range)  sodium chloride 0.9 % bolus 1,000 mL (has no administration in time range)     Initial Impression / Assessment and Plan / ED Course  I have reviewed the triage vital signs and the nursing notes.  Pertinent labs & imaging results that were available during my care of the patient were reviewed by me and considered in my medical decision making (see chart for details).       Patient with palpitations and heart pounding sensation over the past 2 days.  EKG is sinus rhythm. Has had intermittent episodes of vomiting and cough and congestion over the past 5 days. Abdomen benign. No chest pain or SOB.  He is given IV fluids and symptom control.  Labs are reassuring with negative troponin and d-dimer.  Orthostatics are negative.  Patient given IV and p.o. fluids.  Troponin negative x2 with no arrhythmias seen throughout ED stay.  Blood pressure has improved. D-dimer negative.   Patient declines imaging of his head.  States his headache is gradual in onset and not thunderclap.  His vomiting that he had earlier in the week is improving.  He has no focal neurological deficits no headache currently.  No fever or meningismus.  Suspect viral syndrome.  Patient is instructed on oral hydration at home and antiemetics.  He will be given follow-up for cardiology for consideration of possible Holter monitor if his palpitations persist. Return precautions discussed including exertional chest pain, shortness of breath, unilateral weakness or numbness, difficulty speaking, difficulty swallowing or any other concerns.   Final Clinical Impressions(s) / ED Diagnoses    Final diagnoses:  Palpitations  Viral syndrome    ED Discharge Orders    None  Ezequiel Essex, MD 12/12/18 0730

## 2018-12-12 NOTE — Discharge Instructions (Addendum)
Keep yourself hydrated.  You declined CT scan of your head today.  There is no evidence of heart attack or blood clot in the lung.  Follow-up with a cardiologist for consideration of wearing a monitor at home to assess for any arrhythmias.  Return to the ED if you have chest pain, shortness of breath or any other concerns.

## 2019-07-19 ENCOUNTER — Other Ambulatory Visit: Payer: Self-pay

## 2019-07-19 DIAGNOSIS — Z20822 Contact with and (suspected) exposure to covid-19: Secondary | ICD-10-CM

## 2019-07-20 LAB — NOVEL CORONAVIRUS, NAA: SARS-CoV-2, NAA: NOT DETECTED

## 2019-08-09 DIAGNOSIS — F10139 Alcohol abuse with withdrawal, unspecified: Secondary | ICD-10-CM | POA: Insufficient documentation

## 2019-10-28 ENCOUNTER — Emergency Department: Payer: Self-pay

## 2019-10-28 ENCOUNTER — Emergency Department
Admission: EM | Admit: 2019-10-28 | Discharge: 2019-10-28 | Disposition: A | Discharge status: Home / Self Care | Payer: Self-pay | Attending: Student in an Organized Health Care Education/Training Program | Admitting: Student in an Organized Health Care Education/Training Program

## 2019-10-28 ENCOUNTER — Other Ambulatory Visit: Payer: Self-pay

## 2019-10-28 ENCOUNTER — Encounter: Payer: Self-pay | Admitting: Emergency Medicine

## 2019-10-28 DIAGNOSIS — I10 Essential (primary) hypertension: Secondary | ICD-10-CM | POA: Insufficient documentation

## 2019-10-28 DIAGNOSIS — S2242XA Multiple fractures of ribs, left side, initial encounter for closed fracture: Secondary | ICD-10-CM | POA: Insufficient documentation

## 2019-10-28 DIAGNOSIS — Y990 Civilian activity done for income or pay: Secondary | ICD-10-CM | POA: Insufficient documentation

## 2019-10-28 DIAGNOSIS — R1012 Left upper quadrant pain: Secondary | ICD-10-CM | POA: Insufficient documentation

## 2019-10-28 DIAGNOSIS — W108XXA Fall (on) (from) other stairs and steps, initial encounter: Secondary | ICD-10-CM | POA: Insufficient documentation

## 2019-10-28 DIAGNOSIS — Z79899 Other long term (current) drug therapy: Secondary | ICD-10-CM | POA: Insufficient documentation

## 2019-10-28 DIAGNOSIS — F17210 Nicotine dependence, cigarettes, uncomplicated: Secondary | ICD-10-CM | POA: Insufficient documentation

## 2019-10-28 DIAGNOSIS — W19XXXA Unspecified fall, initial encounter: Secondary | ICD-10-CM

## 2019-10-28 DIAGNOSIS — Y929 Unspecified place or not applicable: Secondary | ICD-10-CM | POA: Insufficient documentation

## 2019-10-28 DIAGNOSIS — Y9389 Activity, other specified: Secondary | ICD-10-CM | POA: Insufficient documentation

## 2019-10-28 LAB — COMPREHENSIVE METABOLIC PANEL
ALT: 43 U/L (ref 0–44)
AST: 36 U/L (ref 15–41)
Albumin: 4.3 g/dL (ref 3.5–5.0)
Alkaline Phosphatase: 68 U/L (ref 38–126)
Anion gap: 11 (ref 5–15)
BUN: 10 mg/dL (ref 6–20)
CO2: 24 mmol/L (ref 22–32)
Calcium: 8.9 mg/dL (ref 8.9–10.3)
Chloride: 103 mmol/L (ref 98–111)
Creatinine, Ser: 0.66 mg/dL (ref 0.61–1.24)
GFR calc Af Amer: >60 mL/min (ref >60)
GFR calc non Af Amer: >60 mL/min (ref >60)
Glucose, Bld: 106 mg/dL — ABNORMAL HIGH (ref 70–99)
Potassium: 3.9 mmol/L (ref 3.5–5.1)
Sodium: 138 mmol/L (ref 135–145)
Total Bilirubin: 2 mg/dL — ABNORMAL HIGH (ref 0.3–1.2)
Total Protein: 8.1 g/dL (ref 6.5–8.1)

## 2019-10-28 LAB — URINALYSIS, COMPLETE (UACMP) WITH MICROSCOPIC
Bacteria, UA: NONE SEEN
Bilirubin Urine: NEGATIVE
Glucose, UA: NEGATIVE mg/dL
Hgb urine dipstick: NEGATIVE
Ketones, ur: 20 mg/dL — AB
Leukocytes,Ua: NEGATIVE
Nitrite: NEGATIVE
Protein, ur: 30 mg/dL — AB
Specific Gravity, Urine: 1.028 (ref 1.005–1.030)
pH: 5 (ref 5.0–8.0)

## 2019-10-28 LAB — CBC WITH DIFFERENTIAL/PLATELET
Abs Immature Granulocytes: 0.1 10*3/uL — ABNORMAL HIGH (ref 0.00–0.07)
Basophils Absolute: 0 10*3/uL (ref 0.0–0.1)
Basophils Relative: 1 %
Eosinophils Absolute: 0.5 10*3/uL (ref 0.0–0.5)
Eosinophils Relative: 7 %
HCT: 44.9 % (ref 39.0–52.0)
Hemoglobin: 16 g/dL (ref 13.0–17.0)
Immature Granulocytes: 1 %
Lymphocytes Relative: 32 %
Lymphs Abs: 2.3 10*3/uL (ref 0.7–4.0)
MCH: 32.5 pg (ref 26.0–34.0)
MCHC: 35.6 g/dL (ref 30.0–36.0)
MCV: 91.1 fL (ref 80.0–100.0)
Monocytes Absolute: 0.5 10*3/uL (ref 0.1–1.0)
Monocytes Relative: 7 %
Neutro Abs: 3.8 10*3/uL (ref 1.7–7.7)
Neutrophils Relative %: 52 %
Platelets: 294 10*3/uL (ref 150–400)
RBC: 4.93 MIL/uL (ref 4.22–5.81)
RDW: 12.2 % (ref 11.5–15.5)
WBC: 7.2 10*3/uL (ref 4.0–10.5)
nRBC: 0 % (ref 0.0–0.2)

## 2019-10-28 MED ORDER — ONDANSETRON HCL 4 MG/2ML IJ SOLN
4.0000 mg | Freq: Once | INTRAMUSCULAR | Status: AC
  Administered 2019-10-28: 4 mg via INTRAVENOUS
  Filled 2019-10-28: qty 2

## 2019-10-28 MED ORDER — FENTANYL CITRATE (PF) 100 MCG/2ML IJ SOLN
50.0000 ug | INTRAMUSCULAR | Status: DC | PRN
  Administered 2019-10-28: 50 ug via INTRAVENOUS
  Filled 2019-10-28: qty 2

## 2019-10-28 MED ORDER — IOHEXOL 300 MG/ML  SOLN
100.0000 mL | Freq: Once | INTRAMUSCULAR | Status: AC | PRN
  Administered 2019-10-28: 100 mL via INTRAVENOUS

## 2019-10-28 MED ORDER — MORPHINE SULFATE (PF) 4 MG/ML IV SOLN: 4.0000 mg | INTRAVENOUS | Status: DC | PRN

## 2019-10-28 MED ORDER — LIDOCAINE 5 % EX PTCH
1.0000 | MEDICATED_PATCH | CUTANEOUS | Status: DC
  Administered 2019-10-28: 1 via TRANSDERMAL
  Filled 2019-10-28: qty 1

## 2019-10-28 MED ORDER — CYCLOBENZAPRINE HCL 5 MG PO TABS: 5.0000 mg | ORAL_TABLET | Freq: Three times a day (TID) | ORAL | 0 refills | Status: DC | PRN

## 2019-10-28 MED ORDER — MORPHINE SULFATE (PF) 4 MG/ML IV SOLN
4.0000 mg | Freq: Once | INTRAVENOUS | Status: AC
  Administered 2019-10-28: 4 mg via INTRAVENOUS
  Filled 2019-10-28: qty 1

## 2019-10-28 MED ORDER — OXYCODONE-ACETAMINOPHEN 5-325 MG PO TABS: 1.0000 | ORAL_TABLET | Freq: Four times a day (QID) | ORAL | 0 refills | Status: DC | PRN

## 2019-10-28 MED ORDER — KETOROLAC TROMETHAMINE 30 MG/ML IJ SOLN: 15.0000 mg | Freq: Once | INTRAMUSCULAR | Status: DC

## 2019-10-28 NOTE — ED Provider Notes (Signed)
[facility] Emergency Department Provider Note    First MD Initiated Contact with Patient 10/28/19 [phone]     (approximate)  I have reviewed the triage vital signs and the nursing notes.   HISTORY  Chief Complaint Fall    HPI Omar Turner is a 49 y.o. male with the below listed past medical history presents to the ER for evaluation of left-sided flank and chest wall pain that occurred Monday after the patient was working and fell onto concrete steps landing on that area.  And since then has had progressively worsening pain particularly with coughing.  Also having some left upper quadrant abdominal pain.  No nausea or vomiting.  No recent fevers.  Not on anticoagulation.  No hematuria     Past Medical History:  Diagnosis Date  . Anginal pain ([place])   . Anxiety   . Degenerative disc disease, cervical   . Diverticulitis large intestine   . GERD (gastroesophageal reflux disease)   . History of chest pain   . Hyperlipidemia   . Hypertension   . Sleep apnea   . Tobacco abuse    Family History  Problem Relation Age of Onset  . Hypertension Father   . Heart disease Father        CABG x 3 & valve replacement.   [name] Heart attack Father    Past Surgical History:  Procedure Laterality Date  . APPENDECTOMY    . CARDIAC CATHETERIZATION Left 06/17/2016   Procedure: Left Heart Cath and Coronary Angiography;  Surgeon: Wellington Hampshire, MD;  Location: [facility];  Service: Cardiovascular;  Laterality: Left;  . COLONOSCOPY    . COLONOSCOPY WITH PROPOFOL N/A 09/07/2015   Procedure: COLONOSCOPY WITH PROPOFOL;  Surgeon: Hulen Luster, MD;  Location: [facility] ENDOSCOPY;  Service: Gastroenterology;  Laterality: N/A;  . COLONOSCOPY WITH PROPOFOL N/A 05/12/2018   Procedure: COLONOSCOPY WITH PROPOFOL;  Surgeon: Jonathon Bellows, MD;  Location: [facility] ENDOSCOPY;  Service: Gastroenterology;  Laterality: N/A;  . HERNIA REPAIR     Patient Active Problem List   Diagnosis Date  Noted  . DDD (degenerative disc disease), cervical 03/20/2018  . Hypertension 03/20/2018  . Sleep apnea 03/20/2018  . Diverticulitis of large intestine with perforation without abscess or bleeding 03/15/2018  . Gastroesophageal reflux disease without esophagitis 04/03/2017  . History of chest pain 04/03/2017  . Other hyperlipidemia 04/03/2017  . Precordial pain   . Tobacco abuse 08/21/2015  . Anxiety state 02/15/2015      Prior to Admission medications   Medication Sig Start Date End Date Taking? Authorizing Provider  amLODipine (NORVASC) 5 MG tablet Take 5 mg by mouth daily.    [provider]  aspirin EC 81 MG tablet Take 1 tablet (81 mg total) by mouth daily. 06/12/16   Wende Bushy, MD  cyclobenzaprine (FLEXERIL) 5 MG tablet Take 1 tablet (5 mg total) by mouth 3 (three) times daily as needed for muscle spasms. 10/28/19   Merlyn Lot, MD  nitroGLYCERIN (NITROSTAT) 0.4 MG SL tablet Place 1 tablet (0.4 mg total) under the tongue every 5 (five) minutes as needed. 06/12/16   Wende Bushy, MD  ondansetron (ZOFRAN ODT) 4 MG disintegrating tablet Take 1 tablet (4 mg total) by mouth every 8 (eight) hours as needed for nausea or vomiting. 12/12/18   Rancour, Annie Main, MD  oxyCODONE-acetaminophen (PERCOCET) 5-325 MG tablet Take 1 tablet by mouth every 6 (six) hours as needed for severe pain. 10/28/19 10/27/20  Merlyn Lot,  MD  pantoprazole (PROTONIX) 40 MG tablet Take 40 mg by mouth daily.    [provider]    Allergies Chantix [varenicline] and Lexapro [escitalopram]    Social History Social History   Tobacco Use  . Smoking status: Current Every Day Smoker    Packs/day: 1.50    Years: 15.00    Pack years: 22.50  . Smokeless tobacco: Never Used  Substance Use Topics  . Alcohol use: Yes    Alcohol/week: 2.0 standard drinks    Types: 2 Shots of liquor per week    Comment: per day  . Drug use: No    Review of Systems Patient denies headaches, rhinorrhea,  blurry vision, numbness, shortness of breath, chest pain, edema, cough, abdominal pain, nausea, vomiting, diarrhea, dysuria, fevers, rashes or hallucinations unless otherwise stated above in HPI. ____________________________________________   PHYSICAL EXAM:  VITAL SIGNS: Vitals:   10/28/19 0838 10/28/19 0900  BP:  (!) 133/93  Pulse: 91 87  Resp:    Temp:    SpO2: 97% 96%    Constitutional: Alert and oriented.  Eyes: Conjunctivae are normal.  Head: Atraumatic. Nose: No congestion/rhinnorhea. Mouth/Throat: Mucous membranes are moist.   Neck: No stridor. Painless ROM.  Cardiovascular: Normal rate, regular rhythm. Grossly normal heart sounds.  Good peripheral circulation. Respiratory: Normal respiratory effort.  No retractions. Lungs CTAB. Gastrointestinal: TTP of luq and left flank,  No crepitus or obvious ecchymosis. No distention. No abdominal bruits.  Genitourinary:  Musculoskeletal: No lower extremity tenderness nor edema.  No joint effusions. Neurologic:  Normal speech and language. No gross focal neurologic deficits are appreciated. No facial droop Skin:  Skin is warm, dry and intact. No rash noted. Psychiatric: Mood and affect are normal. Speech and behavior are normal.  ____________________________________________   LABS (all labs ordered are listed, but only abnormal results are displayed)  Results for orders placed or performed during the hospital encounter of 10/28/19 (from the past 24 hour(s))  CBC with Differential/Platelet     Status: Abnormal   Collection Time: 10/28/19  7:49 AM  Result Value Ref Range   WBC 7.2 4.0 - 10.5 K/uL   RBC 4.93 4.22 - 5.81 MIL/uL   Hemoglobin 16.0 13.0 - 17.0 g/dL   HCT 44.9 39.0 - 52.0 %   MCV 91.1 80.0 - 100.0 fL   MCH 32.5 26.0 - 34.0 pg   MCHC 35.6 30.0 - 36.0 g/dL   RDW 12.2 11.5 - 15.5 %   Platelets 294 150 - 400 K/uL   nRBC 0.0 0.0 - 0.2 %   Neutrophils Relative % 52 %   Neutro Abs 3.8 1.7 - 7.7 K/uL   Lymphocytes  Relative 32 %   Lymphs Abs 2.3 0.7 - 4.0 K/uL   Monocytes Relative 7 %   Monocytes Absolute 0.5 0.1 - 1.0 K/uL   Eosinophils Relative 7 %   Eosinophils Absolute 0.5 0.0 - 0.5 K/uL   Basophils Relative 1 %   Basophils Absolute 0.0 0.0 - 0.1 K/uL   Immature Granulocytes 1 %   Abs Immature Granulocytes 0.10 (H) 0.00 - 0.07 K/uL  Comprehensive metabolic panel     Status: Abnormal   Collection Time: 10/28/19  7:49 AM  Result Value Ref Range   Sodium 138 135 - 145 mmol/L   Potassium 3.9 3.5 - 5.1 mmol/L   Chloride 103 98 - 111 mmol/L   CO2 24 22 - 32 mmol/L   Glucose, Bld 106 (H) 70 - 99 mg/dL  BUN 10 6 - 20 mg/dL   Creatinine, Ser 0.66 0.61 - 1.24 mg/dL   Calcium 8.9 8.9 - 10.3 mg/dL   Total Protein 8.1 6.5 - 8.1 g/dL   Albumin 4.3 3.5 - 5.0 g/dL   AST 36 15 - 41 U/L   ALT 43 0 - 44 U/L   Alkaline Phosphatase 68 38 - 126 U/L   Total Bilirubin 2.0 (H) 0.3 - 1.2 mg/dL   GFR calc non Af Amer >60 >60 mL/min   GFR calc Af Amer >60 >60 mL/min   Anion gap 11 5 - 15  Urinalysis, Complete w Microscopic     Status: Abnormal   Collection Time: 10/28/19  7:49 AM  Result Value Ref Range   Color, Urine AMBER (A) YELLOW   APPearance HAZY (A) CLEAR   Specific Gravity, Urine 1.028 1.005 - 1.030   pH 5.0 5.0 - 8.0   Glucose, UA NEGATIVE NEGATIVE mg/dL   Hgb urine dipstick NEGATIVE NEGATIVE   Bilirubin Urine NEGATIVE NEGATIVE   Ketones, ur 20 (A) NEGATIVE mg/dL   Protein, ur 30 (A) NEGATIVE mg/dL   Nitrite NEGATIVE NEGATIVE   Leukocytes,Ua NEGATIVE NEGATIVE   RBC / HPF 0-5 0 - 5 RBC/hpf   WBC, UA 0-5 0 - 5 WBC/hpf   Bacteria, UA NONE SEEN NONE SEEN   Squamous Epithelial / LPF 0-5 0 - 5   Mucus PRESENT    ____________________________________________ ____________________________________________  RADIOLOGY  I personally reviewed all radiographic images ordered to evaluate for the above acute complaints and reviewed radiology reports and findings.  These findings were personally discussed  with the patient.  Please see medical record for radiology report.  ____________________________________________   PROCEDURES  Procedure(s) performed:  Procedures    Critical Care performed: no ____________________________________________   INITIAL IMPRESSION / ASSESSMENT AND PLAN / ED COURSE  Pertinent labs & imaging results that were available during my care of the patient were reviewed by me and considered in my medical decision making (see chart for details).   DDX: sah, sdh, edh, fracture, contusion, soft tissue injury, viscous injury, concussion, hemorrhage  FRAZIER METZKER is a 49 y.o. who presents to the ED with pain as described above.  Imaging ordered for the above description.  Given his abdominal pain CT imaging also ordered to evaluate for splenic or renal injury.  Fortunately no evidence of traumatic intra-abdominal process.  Pain secondary to multiple rib fractures.  He is not hypoxic.  Will give pain medication as well as incentive spirometer.  Patient counseled on smoking cessation.  Have discussed with the patient and available family all diagnostics and treatments performed thus far and all questions were answered to the best of my ability. The patient demonstrates understanding and agreement with plan.      The patient was evaluated in Emergency Department today for the symptoms described in the history of present illness. He/she was evaluated in the context of the global COVID-19 pandemic, which necessitated consideration that the patient might be at risk for infection with the SARS-CoV-2 virus that causes COVID-19. Institutional protocols and algorithms that pertain to the evaluation of patients at risk for COVID-19 are in a state of rapid change based on information released by regulatory bodies including the CDC and federal and state organizations. These policies and algorithms were followed during the patient's care in the ED.  As part of my medical decision  making, I reviewed the following data within the [place] notes reviewed  and incorporated, Labs reviewed, notes from prior ED visits and [place] Controlled Substance Database   ____________________________________________   FINAL CLINICAL IMPRESSION(S) / ED DIAGNOSES  Final diagnoses:  Fall, initial encounter  Closed fracture of multiple ribs of left side, initial encounter      NEW MEDICATIONS STARTED DURING THIS VISIT:  New Prescriptions   CYCLOBENZAPRINE (FLEXERIL) 5 MG TABLET    Take 1 tablet (5 mg total) by mouth 3 (three) times daily as needed for muscle spasms.   OXYCODONE-ACETAMINOPHEN (PERCOCET) 5-325 MG TABLET    Take 1 tablet by mouth every 6 (six) hours as needed for severe pain.     Note:  This document was prepared using Dragon voice recognition software and may include unintentional dictation errors.    Merlyn Lot, MD 10/28/19 [phone]

## 2019-10-28 NOTE — ED Notes (Signed)
Pt transported for xray.

## 2019-10-28 NOTE — ED Triage Notes (Signed)
Patient presents to the ED with left lower rib pain after a fall on Monday.  Patient states he fell down 3-4 concrete steps.  Patient denies hitting his head or loss of consciousness.  Patient states he is concerned because he has noticed increased shortness of breath and pain when he laughs or coughs.  Patient ambulatory to triage at this time with no obvious distress at this time.

## 2019-11-25 ENCOUNTER — Emergency Department: Payer: Self-pay

## 2019-11-25 ENCOUNTER — Emergency Department
Admission: EM | Admit: 2019-11-25 | Discharge: 2019-11-25 | Disposition: A | Discharge status: Home / Self Care | Payer: Self-pay | Attending: Emergency Medicine | Admitting: Emergency Medicine

## 2019-11-25 ENCOUNTER — Other Ambulatory Visit: Payer: Self-pay

## 2019-11-25 ENCOUNTER — Encounter: Payer: Self-pay | Admitting: Emergency Medicine

## 2019-11-25 DIAGNOSIS — I10 Essential (primary) hypertension: Secondary | ICD-10-CM | POA: Insufficient documentation

## 2019-11-25 DIAGNOSIS — Y929 Unspecified place or not applicable: Secondary | ICD-10-CM | POA: Insufficient documentation

## 2019-11-25 DIAGNOSIS — Y939 Activity, unspecified: Secondary | ICD-10-CM | POA: Insufficient documentation

## 2019-11-25 DIAGNOSIS — S93402A Sprain of unspecified ligament of left ankle, initial encounter: Secondary | ICD-10-CM | POA: Insufficient documentation

## 2019-11-25 DIAGNOSIS — Z7982 Long term (current) use of aspirin: Secondary | ICD-10-CM | POA: Insufficient documentation

## 2019-11-25 DIAGNOSIS — F17200 Nicotine dependence, unspecified, uncomplicated: Secondary | ICD-10-CM | POA: Insufficient documentation

## 2019-11-25 DIAGNOSIS — Z79899 Other long term (current) drug therapy: Secondary | ICD-10-CM | POA: Insufficient documentation

## 2019-11-25 DIAGNOSIS — W208XXA Other cause of strike by thrown, projected or falling object, initial encounter: Secondary | ICD-10-CM | POA: Insufficient documentation

## 2019-11-25 DIAGNOSIS — Y999 Unspecified external cause status: Secondary | ICD-10-CM | POA: Insufficient documentation

## 2019-11-25 MED ORDER — OXYCODONE-ACETAMINOPHEN 7.5-325 MG PO TABS: 1.0000 | ORAL_TABLET | Freq: Four times a day (QID) | ORAL | 0 refills | Status: DC | PRN

## 2019-11-25 MED ORDER — IBUPROFEN 600 MG PO TABS: 600.0000 mg | ORAL_TABLET | Freq: Three times a day (TID) | ORAL | 0 refills | Status: DC | PRN

## 2019-11-25 MED ORDER — IBUPROFEN 600 MG PO TABS
600.0000 mg | ORAL_TABLET | Freq: Once | ORAL | Status: AC
  Administered 2019-11-25: 600 mg via ORAL
  Filled 2019-11-25: qty 1

## 2019-11-25 MED ORDER — OXYCODONE-ACETAMINOPHEN 5-325 MG PO TABS
1.0000 | ORAL_TABLET | Freq: Once | ORAL | Status: AC
  Administered 2019-11-25: 1 via ORAL
  Filled 2019-11-25: qty 1

## 2019-11-25 NOTE — ED Triage Notes (Signed)
Was putting a generator on a truck lat night and tripped and landed on left foot.  Says felt ankle pop and it is painful.

## 2019-11-25 NOTE — ED Provider Notes (Signed)
[facility] Emergency Department Provider Note   ____________________________________________   First MD Initiated Contact with Patient 11/25/19 1026     (approximate)  I have reviewed the triage vital signs and the nursing notes.   HISTORY  Chief Complaint Ankle Pain    HPI Omar Turner is a 49 y.o. male patient complain of left ankle and foot pain.  Patient days pulled the generator of a truck last night and tripped and landed on his left foot.  States that he felt a "pop".  Pain increased with weightbearing.  Rates pain as a 9/10.  Described pain as "achy".  No palliative measure prior to arrival.     Past Medical History:  Diagnosis Date  . Anginal pain ([place])   . Anxiety   . Degenerative disc disease, cervical   . Diverticulitis large intestine   . GERD (gastroesophageal reflux disease)   . History of chest pain   . Hyperlipidemia   . Hypertension   . Sleep apnea   . Tobacco abuse     Patient Active Problem List   Diagnosis Date Noted  . DDD (degenerative disc disease), cervical 03/20/2018  . Hypertension 03/20/2018  . Sleep apnea 03/20/2018  . Diverticulitis of large intestine with perforation without abscess or bleeding 03/15/2018  . Gastroesophageal reflux disease without esophagitis 04/03/2017  . History of chest pain 04/03/2017  . Other hyperlipidemia 04/03/2017  . Precordial pain   . Tobacco abuse 08/21/2015  . Anxiety state 02/15/2015    Past Surgical History:  Procedure Laterality Date  . APPENDECTOMY    . CARDIAC CATHETERIZATION Left 06/17/2016   Procedure: Left Heart Cath and Coronary Angiography;  Surgeon: Wellington Hampshire, MD;  Location: [place] CV LAB;  Service: Cardiovascular;  Laterality: Left;  . COLONOSCOPY    . COLONOSCOPY WITH PROPOFOL N/A 09/07/2015   Procedure: COLONOSCOPY WITH PROPOFOL;  Surgeon: Hulen Luster, MD;  Location: [facility] ENDOSCOPY;  Service: Gastroenterology;  Laterality: N/A;  . COLONOSCOPY  WITH PROPOFOL N/A 05/12/2018   Procedure: COLONOSCOPY WITH PROPOFOL;  Surgeon: Jonathon Bellows, MD;  Location: [facility] ENDOSCOPY;  Service: Gastroenterology;  Laterality: N/A;  . HERNIA REPAIR      Prior to Admission medications   Medication Sig Start Date End Date Taking? Authorizing Provider  amLODipine (NORVASC) 5 MG tablet Take 5 mg by mouth daily.    [provider]  aspirin EC 81 MG tablet Take 1 tablet (81 mg total) by mouth daily. 06/12/16   Wende Bushy, MD  ibuprofen (ADVIL) 600 MG tablet Take 1 tablet (600 mg total) by mouth every 8 (eight) hours as needed. 11/25/19   Sable Feil, PA-C  oxyCODONE-acetaminophen (PERCOCET) 7.5-325 MG tablet Take 1 tablet by mouth every 6 (six) hours as needed. 11/25/19   Sable Feil, PA-C  pantoprazole (PROTONIX) 40 MG tablet Take 40 mg by mouth daily.    [provider]    Allergies Chantix [varenicline] and Lexapro [escitalopram]  Family History  Problem Relation Age of Onset  . Hypertension Father   . Heart disease Father        CABG x 3 & valve replacement.   [name] Heart attack Father     Social History Social History   Tobacco Use  . Smoking status: Current Every Day Smoker    Packs/day: 1.50    Years: 15.00    Pack years: 22.50  . Smokeless tobacco: Never Used  Substance Use Topics  . Alcohol use: Yes  Alcohol/week: 2.0 standard drinks    Types: 2 Shots of liquor per week    Comment: per day  . Drug use: No    Review of Systems Constitutional: No fever/chills Eyes: No visual changes. ENT: No sore throat. Cardiovascular: Denies chest pain. Respiratory: Denies shortness of breath. Gastrointestinal: No abdominal pain.  No nausea, no vomiting.  No diarrhea.  No constipation. Genitourinary: Negative for dysuria. Musculoskeletal: Left ankle pain.   Skin: Negative for rash. Neurological: Negative for headaches, focal weakness or numbness. Psychiatric: Anxiety  Endocrine:  Hyperlipidemia and  hypertension. Allergic/Immunilogical: Chantix and Lexapro.  ____________________________________________   PHYSICAL EXAM:  VITAL SIGNS: ED Triage Vitals  Enc Vitals Group     BP 11/25/19 1019 139/87     Pulse Rate 11/25/19 1019 87     Resp 11/25/19 1019 16     Temp 11/25/19 1019 98.5 F (36.9 C)     Temp Source 11/25/19 1019 Oral     SpO2 11/25/19 1019 97 %     Weight 11/25/19 1020 220 lb (99.8 kg)     Height 11/25/19 1020 5\' 11"  (1.803 m)     Head Circumference --      Peak Flow --      Pain Score 11/25/19 1020 9     Pain Loc --      Pain Edu? --      Excl. in [place]? --    Constitutional: Alert and oriented. Well appearing and in no acute distress. Cardiovascular: Normal rate, regular rhythm. Grossly normal heart sounds.  Good peripheral circulation. Respiratory: Normal respiratory effort.  No retractions. Lungs CTAB. Musculoskeletal: No obvious deformity to the left ankle.  Moderate edema to the lateral aspect of the ankle.  Moderate guarding palpation.  Decreased range of motion with eversion and flexion.  No lower extremity tenderness nor edema.  No joint effusions. Neurologic:  Normal speech and language. No gross focal neurologic deficits are appreciated. No gait instability. Skin:  Skin is warm, dry and intact. No rash noted. Psychiatric: Mood and affect are normal. Speech and behavior are normal.  ____________________________________________   LABS (all labs ordered are listed, but only abnormal results are displayed)  Labs Reviewed - No data to display ____________________________________________  EKG   ____________________________________________  RADIOLOGY  ED MD interpretation:    Official radiology report(s): DG Ankle Complete Left  Result Date: 11/25/2019 CLINICAL DATA:  Fall EXAM: LEFT ANKLE COMPLETE - 3+ VIEW COMPARISON:  None. FINDINGS: There is no evidence of fracture, dislocation, or joint effusion. There is no evidence of arthropathy or other  focal bone abnormality. Soft tissues are unremarkable. IMPRESSION: Negative. Electronically Signed   By: Franchot Gallo M.D.   On: 11/25/2019 11:13    ____________________________________________   PROCEDURES  Procedure(s) performed (including Critical Care):  Procedures   ____________________________________________   INITIAL IMPRESSION / ASSESSMENT AND PLAN / ED COURSE  As part of my medical decision making, I reviewed the following data within the [place] with left ankle pain secondary to a fall last night.  Discussed x-ray findings with patient.  Patient physical exam consistent with ankle sprain.  Patient placed in ankle stirrup splint and given crutches to assist with ambulation.  Follow discharge care instruction take medication as directed.  Follow-up PCP if condition persist.    Omar Turner was evaluated in Emergency Department on 11/25/2019 for the symptoms described in the history of present illness. He was evaluated in the context  of the global COVID-19 pandemic, which necessitated consideration that the patient might be at risk for infection with the SARS-CoV-2 virus that causes COVID-19. Institutional protocols and algorithms that pertain to the evaluation of patients at risk for COVID-19 are in a state of rapid change based on information released by regulatory bodies including the CDC and federal and state organizations. These policies and algorithms were followed during the patient's care in the ED.       ____________________________________________   FINAL CLINICAL IMPRESSION(S) / ED DIAGNOSES  Final diagnoses:  Sprain of left ankle, unspecified ligament, initial encounter     ED Discharge Orders         Ordered    oxyCODONE-acetaminophen (PERCOCET) 7.5-325 MG tablet  Every 6 hours PRN     11/25/19 1154    ibuprofen (ADVIL) 600 MG tablet  Every 8 hours PRN     11/25/19 1154           Note:  This document was  prepared using Dragon voice recognition software and may include unintentional dictation errors.    Sable Feil, PA-C 11/25/19 1157    Delman Kitten, MD 11/25/19 [phone]

## 2019-11-25 NOTE — ED Notes (Signed)
Pt discharged home after verbalizing understanding of discharge instructions; nad noted. 

## 2019-11-25 NOTE — Discharge Instructions (Addendum)
Follow discharge care instructions.  Wear splint ambulate with support for 2 to 3 days as needed.  Be advised pain medication may cause drowsiness.

## 2019-11-30 ENCOUNTER — Emergency Department: Payer: Self-pay

## 2019-11-30 ENCOUNTER — Other Ambulatory Visit: Payer: Self-pay

## 2019-11-30 ENCOUNTER — Emergency Department
Admission: EM | Admit: 2019-11-30 | Discharge: 2019-11-30 | Disposition: A | Discharge status: Home / Self Care | Payer: Self-pay | Attending: Emergency Medicine | Admitting: Emergency Medicine

## 2019-11-30 ENCOUNTER — Encounter: Payer: Self-pay | Admitting: Emergency Medicine

## 2019-11-30 DIAGNOSIS — F17200 Nicotine dependence, unspecified, uncomplicated: Secondary | ICD-10-CM | POA: Insufficient documentation

## 2019-11-30 DIAGNOSIS — S93432D Sprain of tibiofibular ligament of left ankle, subsequent encounter: Secondary | ICD-10-CM | POA: Insufficient documentation

## 2019-11-30 DIAGNOSIS — I10 Essential (primary) hypertension: Secondary | ICD-10-CM | POA: Insufficient documentation

## 2019-11-30 DIAGNOSIS — S93409A Sprain of unspecified ligament of unspecified ankle, initial encounter: Secondary | ICD-10-CM

## 2019-11-30 DIAGNOSIS — X500XXD Overexertion from strenuous movement or load, subsequent encounter: Secondary | ICD-10-CM | POA: Insufficient documentation

## 2019-11-30 DIAGNOSIS — Z79899 Other long term (current) drug therapy: Secondary | ICD-10-CM | POA: Insufficient documentation

## 2019-11-30 DIAGNOSIS — Z7982 Long term (current) use of aspirin: Secondary | ICD-10-CM | POA: Insufficient documentation

## 2019-11-30 MED ORDER — OXYCODONE-ACETAMINOPHEN 5-325 MG PO TABS: 1.0000 | ORAL_TABLET | Freq: Four times a day (QID) | ORAL | 0 refills | Status: DC | PRN

## 2019-11-30 MED ORDER — MELOXICAM 15 MG PO TABS: 15.0000 mg | ORAL_TABLET | Freq: Every day | ORAL | 0 refills | Status: DC

## 2019-11-30 MED ORDER — MELOXICAM 7.5 MG PO TABS
15.0000 mg | ORAL_TABLET | Freq: Once | ORAL | Status: AC
  Administered 2019-11-30: 15 mg via ORAL
  Filled 2019-11-30: qty 2

## 2019-11-30 NOTE — ED Triage Notes (Signed)
Presents with left ankle pain and swelling  Stats he had an injury last week  Was seen and treated at that time

## 2019-11-30 NOTE — ED Provider Notes (Signed)
[facility] Emergency Department Provider Note  ____________________________________________  Time seen: Approximately 3:24 PM  I have reviewed the triage vital signs and the nursing notes.   HISTORY  Chief Complaint Ankle Pain    HPI Omar Turner is a 49 y.o. male who presents the emergency department complaining of sharp left ankle pain with worsening edema and ecchymosis.  Sustained an injury that he described as "rolling his ankle" 5 days ago.  Patient was initially seen in this department, negative x-rays.  Patient states that he has having increasing pain, swelling.  Patient states that the initial time of injury was less painful, less swollen than it is now.  Patient has been taking medications at home including prescribed pain medication and states that the pain is only worsening.  Patient has tried icing it, keeping it elevated.  Even with these techniques, edema is worsening.  No loss of sensation.  No additional injury to the ankle.  Initial imaging revealed no evidence of fracture or dislocation.  Patient was advised to follow-up with his primary care and states that he has made appointment but the closest appointment date is still over 2 weeks away.         Past Medical History:  Diagnosis Date  . Anginal pain ([place])   . Anxiety   . Degenerative disc disease, cervical   . Diverticulitis large intestine   . GERD (gastroesophageal reflux disease)   . History of chest pain   . Hyperlipidemia   . Hypertension   . Sleep apnea   . Tobacco abuse     Patient Active Problem List   Diagnosis Date Noted  . DDD (degenerative disc disease), cervical 03/20/2018  . Hypertension 03/20/2018  . Sleep apnea 03/20/2018  . Diverticulitis of large intestine with perforation without abscess or bleeding 03/15/2018  . Gastroesophageal reflux disease without esophagitis 04/03/2017  . History of chest pain 04/03/2017  . Other hyperlipidemia 04/03/2017  .  Precordial pain   . Tobacco abuse 08/21/2015  . Anxiety state 02/15/2015    Past Surgical History:  Procedure Laterality Date  . APPENDECTOMY    . CARDIAC CATHETERIZATION Left 06/17/2016   Procedure: Left Heart Cath and Coronary Angiography;  Surgeon: Wellington Hampshire, MD;  Location: [place] CV LAB;  Service: Cardiovascular;  Laterality: Left;  . COLONOSCOPY    . COLONOSCOPY WITH PROPOFOL N/A 09/07/2015   Procedure: COLONOSCOPY WITH PROPOFOL;  Surgeon: Hulen Luster, MD;  Location: [facility] ENDOSCOPY;  Service: Gastroenterology;  Laterality: N/A;  . COLONOSCOPY WITH PROPOFOL N/A 05/12/2018   Procedure: COLONOSCOPY WITH PROPOFOL;  Surgeon: Jonathon Bellows, MD;  Location: [facility] ENDOSCOPY;  Service: Gastroenterology;  Laterality: N/A;  . HERNIA REPAIR      Prior to Admission medications   Medication Sig Start Date End Date Taking? Authorizing Provider  amLODipine (NORVASC) 5 MG tablet Take 5 mg by mouth daily.    [provider]  aspirin EC 81 MG tablet Take 1 tablet (81 mg total) by mouth daily. 06/12/16   Wende Bushy, MD  ibuprofen (ADVIL) 600 MG tablet Take 1 tablet (600 mg total) by mouth every 8 (eight) hours as needed. 11/25/19   Sable Feil, PA-C  oxyCODONE-acetaminophen (PERCOCET) 7.5-325 MG tablet Take 1 tablet by mouth every 6 (six) hours as needed. 11/25/19   Sable Feil, PA-C  pantoprazole (PROTONIX) 40 MG tablet Take 40 mg by mouth daily.    [provider]    Allergies Chantix [varenicline] and Lexapro [  escitalopram]  Family History  Problem Relation Age of Onset  . Hypertension Father   . Heart disease Father        CABG x 3 & valve replacement.   [name] Heart attack Father     Social History Social History   Tobacco Use  . Smoking status: Current Every Day Smoker    Packs/day: 1.50    Years: 15.00    Pack years: 22.50  . Smokeless tobacco: Never Used  Substance Use Topics  . Alcohol use: Yes    Alcohol/week: 2.0 standard drinks    Types: 2 Shots  of liquor per week    Comment: per day  . Drug use: No     Review of Systems  Constitutional: No fever/chills Eyes: No visual changes. No discharge ENT: No upper respiratory complaints. Cardiovascular: no chest pain. Respiratory: no cough. No SOB. Gastrointestinal: No abdominal pain.  No nausea, no vomiting.   Musculoskeletal: Positive for left ankle injury/pain.  Worsening pain and edema.  Injury occurred a 5 days ago Skin: Negative for rash, abrasions, lacerations, ecchymosis. Neurological: Negative for headaches, focal weakness or numbness. 10-point ROS otherwise negative.  ____________________________________________   PHYSICAL EXAM:  VITAL SIGNS: ED Triage Vitals  Enc Vitals Group     BP 11/30/19 1521 (!) 137/103     Pulse Rate 11/30/19 1521 (!) 113     Resp 11/30/19 1521 16     Temp 11/30/19 1521 98.2 F (36.8 C)     Temp Source 11/30/19 1521 Oral     SpO2 11/30/19 1521 98 %     Weight 11/30/19 1500 220 lb 0.3 oz (99.8 kg)     Height 11/30/19 1505 5\' 11"  (1.803 m)     Head Circumference --      Peak Flow --      Pain Score 11/30/19 1500 6     Pain Loc --      Pain Edu? --      Excl. in [place]? --      Constitutional: Alert and oriented. Well appearing and in no acute distress. Eyes: Conjunctivae are normal. PERRL. EOMI. Head: Atraumatic. ENT:      Ears:       Nose: No congestion/rhinnorhea.      Mouth/Throat: Mucous membranes are moist.  Neck: No stridor.    Cardiovascular: Normal rate, regular rhythm. Normal S1 and S2.  Good peripheral circulation. Respiratory: Normal respiratory effort without tachypnea or retractions. Lungs CTAB. Good air entry to the bases with no decreased or absent breath sounds. Musculoskeletal: Full range of motion to all extremities. No gross deformities appreciated.  Patient arrives with stirrup ankle splint in place.  This had been placed by this department 5 days ago.  Evaluation reveals mild diffuse ecchymosis over the lateral  aspect of the ankle and foot.  No open wound.  Significant edema is identified grossly about the left ankle extending into the foot.  Thankfully compartments are still soft.  Patient has significant tenderness to palpation along the lateral malleolus, talus, left carpal and metacarpal bones.  There is no palpable abnormality or gross deficits.  Palpation along the Achilles tendons reveals no deficits or palpable abnormality.  Dorsalis pedis pulse intact.  Sensation intact all 5 digits.  Capillary refill less than 2 seconds all digits.  Limited range of motion to the ankle joint.  Good range of motion to the 5 digits of the left foot.  No skin changes concerning for cellulitic changes. Neurologic:  Normal speech  and language. No gross focal neurologic deficits are appreciated.  Skin:  Skin is warm, dry and intact. No rash noted. Psychiatric: Mood and affect are normal. Speech and behavior are normal. Patient exhibits appropriate insight and judgement.   ____________________________________________   LABS (all labs ordered are listed, but only abnormal results are displayed)  Labs Reviewed - No data to display ____________________________________________  EKG   ____________________________________________  RADIOLOGY I personally viewed and evaluated these images as part of my medical decision making, as well as reviewing the written report by the radiologist.  DG Ankle Complete Left  Result Date: 11/30/2019 CLINICAL DATA:  Progressive pain and swelling since an injury on 11/25/2019. EXAM: LEFT ANKLE COMPLETE - 3+ VIEW COMPARISON:  Radiographs dated 11/25/2019 FINDINGS: There is no fracture or dislocation. No appreciable joint effusion. There is circumferential soft tissue swelling, most prominent over the lateral malleolus. IMPRESSION: No acute bone abnormality. Soft tissue swelling. Electronically Signed   By: Lorriane Shire M.D.   On: 11/30/2019 16:33   CT Ankle Left Wo Contrast  Result  Date: 11/30/2019 CLINICAL DATA:  Ankle trauma unable to bear weight EXAM: CT OF THE LEFT ANKLE WITHOUT CONTRAST TECHNIQUE: Multidetector CT imaging of the left ankle was performed according to the standard protocol. Multiplanar CT image reconstructions were also generated. COMPARISON:  None. FINDINGS: Bones/Joint/Cartilage No fracture or dislocation. The ankle mortise is congruent. Normal bone mineralization seen throughout. There is a small ankle joint effusion. Ligaments Suboptimally assessed by CT. However there appears to be heterogeneous appearance and probable focal perforation of the anterior talofibular ligament with heterogeneous probable small hematoma/fluid. Muscles and Tendons The flexor and extensor tendons appear to be are intact. The Achilles tendon is intact. The plantar fascia appears to be intact. The muscles are normal appearance without evidence of focal atrophy or tear. Soft tissues Overlying the extensor tendons along the lateral aspect of the ankle there is a hyperdense probable soft tissue hematoma measuring 3.9 x 0.9 cm in length. There is overlying skin thickening and subcutaneous edema. There is heel pad inflammation present. There is also edema within the dorsal aspect of the midfoot. IMPRESSION: No acute osseous abnormality. Probable focal disruption of the anterior talofibular ligament with small hematoma/fluid in the syndesmosis. If further evaluation is required would recommend MRI. Small soft tissue hematoma overlying the extensor tendons at the level of the ankle. Electronically Signed   By: Prudencio Pair M.D.   On: 11/30/2019 18:03   DG Foot Complete Left  Result Date: 11/30/2019 CLINICAL DATA:  Progressive pain and swelling since an injury 5 days ago. EXAM: LEFT FOOT - COMPLETE 3+ VIEW COMPARISON:  Ankle radiographs dated 11/25/2019 FINDINGS: There is no evidence of fracture or dislocation. There is no evidence of arthropathy or other focal bone abnormality. Slight soft tissue  swelling around the ankle and on the dorsum of the foot. IMPRESSION: Soft tissue swelling. Otherwise, negative. Electronically Signed   By: Lorriane Shire M.D.   On: 11/30/2019 16:34    ____________________________________________    PROCEDURES  Procedure(s) performed:    Procedures    Medications - No data to display   ____________________________________________   INITIAL IMPRESSION / ASSESSMENT AND PLAN / ED COURSE  Pertinent labs & imaging results that were available during my care of the patient were reviewed by me and considered in my medical decision making (see chart for details).  Review of the [place] CSRS was performed in accordance of the [place] prior to dispensing any controlled drugs.  Patient's diagnosis is consistent with anterior talofibular ligament rupture of the left ankle.  Patient presents emergency department complaining of increased pain, edema, ecchymosis to the left ankle after an injury sustained 5 days ago.  Patient had been evaluated with x-ray initially with no evidence of fracture.  Conservative treatment with splint, crutches, pain medications and ibuprofen.  Given worsening symptoms, I reimaged the ankle with x-ray.  No identified fracture.  Given the amount of edema, amount of pain I did evaluate with CT scan of the ankle.  This returns with findings concerning for rupture of the anterior talofibular ligament.  At this time patient will continue to have a splint, I will represcribe pain medication for the patient, placed the patient on meloxicam for additional anti-inflammatory control.  At this time follow-up with orthopedics.. Patient is given ED precautions to return to the ED for any worsening or new symptoms.     ____________________________________________  FINAL CLINICAL IMPRESSION(S) / ED DIAGNOSES  Final diagnoses:  Rupture of ligament of ankle, initial encounter      NEW MEDICATIONS STARTED DURING THIS VISIT:  ED  Discharge Orders    None          This chart was dictated using voice recognition software/Dragon. Despite best efforts to proofread, errors can occur which can change the meaning. Any change was purely unintentional.    Darletta Moll, PA-C 11/30/19 Francoise Schaumann, MD 11/30/19 2100

## 2020-09-23 DIAGNOSIS — E876 Hypokalemia: Secondary | ICD-10-CM | POA: Insufficient documentation

## 2020-09-23 DIAGNOSIS — R7989 Other specified abnormal findings of blood chemistry: Secondary | ICD-10-CM | POA: Insufficient documentation

## 2020-09-23 DIAGNOSIS — R112 Nausea with vomiting, unspecified: Secondary | ICD-10-CM | POA: Insufficient documentation

## 2020-09-23 DIAGNOSIS — U071 COVID-19: Secondary | ICD-10-CM | POA: Insufficient documentation

## 2020-12-08 ENCOUNTER — Ambulatory Visit: Payer: Self-pay | Admitting: Physician Assistant

## 2020-12-08 ENCOUNTER — Encounter: Payer: Self-pay | Admitting: Physician Assistant

## 2020-12-08 ENCOUNTER — Other Ambulatory Visit: Payer: Self-pay

## 2020-12-08 VITALS — BP 134/82 | HR 124 | Temp 98.6°F | Resp 18 | Ht 71.0 in | Wt 234.0 lb

## 2020-12-08 DIAGNOSIS — R600 Localized edema: Secondary | ICD-10-CM

## 2020-12-08 DIAGNOSIS — I10 Essential (primary) hypertension: Secondary | ICD-10-CM

## 2020-12-08 DIAGNOSIS — K219 Gastro-esophageal reflux disease without esophagitis: Secondary | ICD-10-CM

## 2020-12-08 DIAGNOSIS — Z720 Tobacco use: Secondary | ICD-10-CM

## 2020-12-08 MED ORDER — AMLODIPINE BESYLATE 5 MG PO TABS: 5.0000 mg | ORAL_TABLET | Freq: Every day | ORAL | 1 refills | Status: DC

## 2020-12-08 MED ORDER — PANTOPRAZOLE SODIUM 40 MG PO TBEC: 40.0000 mg | DELAYED_RELEASE_TABLET | Freq: Every day | ORAL | 1 refills | Status: DC

## 2020-12-08 MED ORDER — HYDROCHLOROTHIAZIDE 25 MG PO TABS: 25.0000 mg | ORAL_TABLET | Freq: Every day | ORAL | 0 refills | Status: DC

## 2020-12-08 NOTE — Progress Notes (Signed)
Subjective:    Patient ID: Omar Turner, male    DOB: Dec 06, 1970, 50 y.o.   MRN: [id number]  HPI 50 yo M requesting visit- He is pleasant and conversive- concerned about  bilateral swollen legs for past 3 days Hx of similar experience in past- particularly uncomfortable today Now with new job with the [employer]- he is on legs almost all day long and recent  warm weather.  Weight is up to 237.   BP 134/82  Has know hx HTN -reports Amlodipine 5 mg well tolerated in past. Has HX GERD treated with Protonix, both Rx about 10 years Has been out of medications for approximately 2 months, / cost  Hx tobacco abuse 2 packs plus per day x 15 plus years Hx ETOH  " 3 drinks daily " with closer review estimates at least 3 shots per drink. Vodka  Takes 81 ASA daily as ordered Denies other medications  Has been followed by PCP Ramonita Lab MD and has been very pleased with his care- but had to stop after  last year exam and care -with loss of insurance. Has been "making do" with meds in interim  His insurance ran out with last job-now expects new insurance to be in place for 17 March 22. He plans to contact  Dr Caryl Comes office For annual exam and labs to be scheduled after that date.   Tries to avoid visible salts and processed foods- is sodium aware  Familiar with DASH diet-  Review of Systems Denies H/A, SOB,dyspnea, CP or abd pain, no NVD,no hematemesis Notes that abdomen feels larger and somewhat tense, weight up Denies discomfort , recognizes BMs not regular    Has had 2 Covid vaccines Objective:   Physical Exam Vitals (pulse 92 repeat) and nursing note reviewed.  Constitutional:      General: He is not in acute distress.    Appearance: He is obese.  HENT:     Head: Normocephalic and atraumatic.     Nose: Nose normal.     Mouth/Throat:     Mouth: Mucous membranes are moist.  Eyes:     Extraocular Movements: Extraocular movements intact.  Cardiovascular:     Rate and  Rhythm: Regular rhythm.     Comments: Mildly tachycardia on presentation , improved w visit Pulmonary:     Effort: Pulmonary effort is normal. No respiratory distress.     Breath sounds: Normal breath sounds.  Abdominal:     General: There is distension.     Palpations: There is no mass.     Tenderness: There is no abdominal tenderness. There is no right CVA tenderness, left CVA tenderness or guarding.     Comments: protruberant abdomen, tense but discomfort denied No tenderness elicited with overall palpation with  particular focus over liver  and midline No masses identified- no organs specifically outlined  belt is very tight and abdomen protrudes over  Genitourinary:    Comments: defer Musculoskeletal:        General: Swelling present. No tenderness.     Cervical back: Normal range of motion and neck supple.     Right lower leg: Edema present.     Left lower leg: Edema present.     Comments: Lower legs are uncomfortable when dependent But no pain to exam or touch Pitting edema 2-3 + extends to the knee  Socks tracks present with midshin high athletic sock bilat dorsal pedal pulses non palp but good color and no pain  Workboots have been loosened for comfort Ambulates without restriction On and off table without assistance or difficulty  Skin:    General: Skin is warm and dry.     Capillary Refill: Capillary refill takes less than 2 seconds.  Neurological:     General: No focal deficit present.     Mental Status: He is alert and oriented to person, place, and time. Mental status is at baseline.  Psychiatric:        Mood and Affect: Mood normal.        Behavior: Behavior normal.        Thought Content: Thought content normal.     Comments: Alert and interactive  Appropriate conversation and questions Expresses familiarity with changes needing to be made State swants to make a real difference "this time"       Assessment & Plan:  HTN    renew Amolodipine 5 mg  1  qd Bilateral pedal edema   Add HCTZ 25 mg 1 qd GERD  Add pantoprazole sodium 40 mg 1 qd Tobacco abuse  ETOH Abuse  Reviewed DASH menu and fresh and frozen vs processed choices Increase fiber rich foods, increase water and walking in early morning while cool. Skip empty calories - focus on healthy Eliminate belt and use coveralls Eliminate ankle socks with tight cuffs  Elevate legs above heart - lateral/supine on sofa with feet up over this weekend while off duty States he is very serious about want in to limit ETOH and cigarettes and techniques were reviewed- recognizing he must self-regulate Buy Back for cigarettes-- Fresh food over [employer]- bank the difference $$ and save it Plan small Rx as he wishes to purchase Rx with recent payday Scheduled return visit in one week for wt , BP and review- Return more quickly if concerns arise-discussed. Understands to report for care if any complications develop over the weekend as reviewed above. ER or Urgent Care if needed. Patient expresses understanding and has reviewed and questioned/discussed  as we proceeded-  RTC 1 week or prn ....will rec CV 3 at follow up

## 2020-12-08 NOTE — Patient Instructions (Signed)
Alcohol Abuse and Nutrition Alcohol abuse is any pattern of alcohol consumption that harms your health, relationships, or work. Alcohol abuse can cause poor nutrition (malnutrition or malnourishment) and a lack of nutrients (nutrient deficiencies), which can lead to more complications. Alcohol abuse brings malnutrition and nutrient deficiencies in two ways:  It causes your liver to work abnormally. This affects how your body divides (breaks down) and absorbs nutrients from food.  It causes you to eat poorly. Many people who abuse alcohol do not eat enough carbohydrates, protein, fat, vitamins, and minerals. Nutrients that are commonly lacking (deficient) in people who abuse alcohol include:  Vitamins. ? Vitamin A. This is needed for your vision, metabolism, and ability to fight off infections (immunity). ? B vitamins. These include folate, thiamine, and niacin. These are needed for new cell growth. ? Vitamin C. This plays an important role in wound healing, immunity, and helping your body to absorb iron. ? Vitamin D. This is necessary for your body to absorb and use calcium. It is produced by your liver, but you can also get it from food and from sun exposure.  Minerals. ? Calcium. This is needed for healthy bones as well as heart and blood vessel (cardiovascular) function. ? Iron. This is important for blood, muscle, and nervous system functioning. ? Magnesium. This plays an important role in muscle and nerve function, and it helps to control blood sugar and blood pressure. ? Zinc. This is important for the normal functioning of your nervous system and digestive system (gastrointestinal tract). If you think that you have an alcohol dependency problem, or if it is hard to stop drinking because you feel sick or different when you do not use alcohol, talk with your health care provider or another health professional about where to get help. Nutrition is an essential factor in therapy for alcohol  abuse. Your health care provider or diet and nutrition specialist (dietitian) will work with you to design a plan that can help to restore nutrients to your body and prevent the risk of complications. What is my plan? Your dietitian may develop a specific eating plan that is based on your condition and any other problems that you have. An eating plan will commonly include:  A balanced diet. ? Grains: 6-8 oz (170-227 g) a day. Examples of 1 oz of whole grains include 1 cup of whole-wheat cereal,  cup of brown rice, or 1 slice of whole-wheat bread. ? Vegetables: 2-3 cups a day. Examples of 1 cup of vegetables include 2 medium carrots, 1 large tomato, or 2 stalks of celery. ? Fruits: 1-2 cups a day. Examples of 1 cup of fruit include 1 large banana, 1 small apple, 8 large strawberries, or 1 large orange. ? Meat and other protein: 5-6 oz (142-170 g) a day.  A cut of meat or fish that is the size of a deck of cards is about 3-4 oz.  Foods that provide 1 oz of protein include 1 egg,  cup of nuts or seeds, or 1 tablespoon (16 g) of peanut butter. ? Dairy: 2-3 cups a day. Examples of 1 cup of dairy include 8 oz (230 mL) of milk, 8 oz (230 g) of yogurt, or 1 oz (44 g) of natural cheese.  Vitamin and mineral supplements.   What are tips for following this plan?  Eat frequent meals and snacks. Try to eat 5-6 small meals each day.  Take vitamin or mineral supplements as recommended by your dietitian.  If you  are malnourished or if your dietitian recommends it: ? You may follow a high-protein, high-calorie diet. This may include:  2,000-3,000 calories (kilocalories) a day.  70-100 g (grams) of protein a day. ? You may be directed to follow a diet that includes a complete nutritional supplement beverage. This can help to restore calories, protein, and vitamins to your body. Depending on your condition, you may be advised to consume this beverage instead of your meals or in addition to  them.  Certain medicines may cause changes in your appetite, taste, and weight. Work with your health care provider and dietitian to make any changes to your medicines and eating plan.  If you are unable to take in enough food and calories by mouth, your health care provider may recommend a feeding tube. This tube delivers nutritional supplements directly to your stomach. Recommended foods  Eat foods that are high in molecules that prevent oxygen from reacting with your food (antioxidants). These foods include grapes, berries, nuts, green tea, and dark green or orange vegetables. Eating these can help to prevent some of the stress that is placed on your liver by consuming alcohol.  Eat a variety of fresh fruits and vegetables each day. This will help you to get fiber and vitamins in your diet.  Drink plenty of water and other clear fluids, such as apple juice and broth. Try to drink at least 48-64 oz (1.5-2 L) of water a day.  Include foods fortified with vitamins and minerals in your diet. Commonly fortified foods include milk, orange juice, cereal, and bread.  Eat a variety of foods that are high in omega-3 and omega-6 fatty acids. These include fish, nuts and seeds, and soybeans. These foods may help your liver to recover and may also stabilize your mood.  If you are a vegetarian: ? Eat a variety of protein-rich foods. ? Pair whole grains with plant-based proteins at meals and snack time. For example, eat rice with beans, put peanut butter on whole-grain toast, or eat oatmeal with sunflower seeds. The items listed above may not be a complete list of foods and beverages you can eat. Contact a dietitian for more information. Foods to avoid  Avoid foods and drinks that are high in fat and sugar. Sugary drinks, salty snacks, and candy contain empty calories. This means that they lack important nutrients such as protein, fiber, and vitamins.  Avoid alcohol. This is the best way to avoid  malnutrition due to alcohol abuse. If you must drink, drink measured amounts. Measured drinking means limiting your intake to no more than 1 drink a day for nonpregnant women and 2 drinks a day for men. One drink equals 12 oz (355 mL) of beer, 5 oz (148 mL) of wine, or 1 oz (44 mL) of hard liquor.  Limit your intake of caffeine. Replace drinks like coffee and black tea with decaffeinated coffee and decaffeinated herbal tea. The items listed above may not be a complete list of foods and beverages you should avoid. Contact a dietitian for more information. Summary  Alcohol abuse can cause poor nutrition (malnutrition or malnourishment) and a lack of nutrients (nutrient deficiencies), which can lead to more health problems.  Common nutrient deficiencies include vitamin deficiencies (A, B, C, and D) and mineral deficiencies (calcium, iron, magnesium, and zinc).  Nutrition is an essential factor in therapy for alcohol abuse.  Your health care provider and dietitian can help you to develop a specific eating plan that includes a balanced diet plus  vitamin and mineral supplements. This information is not intended to replace advice given to you by your health care provider. Make sure you discuss any questions you have with your health care provider. Document Revised: 01/12/2019 Document Reviewed: 06/10/2017 Elsevier Patient Education  2021 [place]. [email or web address]">  DASH Eating Plan DASH stands for Dietary Approaches to Stop Hypertension. The DASH eating plan is a healthy eating plan that has been shown to:  Reduce high blood pressure (hypertension).  Reduce your risk for type 2 diabetes, heart disease, and stroke.  Help with weight loss. What are tips for following this plan? Reading food labels  Check food labels for the amount of salt (sodium) per serving. Choose foods with less than 5 percent of the Daily Value of sodium. Generally,  foods with less than 300 milligrams (mg) of sodium per serving fit into this eating plan.  To find whole grains, look for the word "whole" as the first word in the ingredient list. Shopping  Buy products labeled as "low-sodium" or "no salt added."  Buy fresh foods. Avoid canned foods and pre-made or frozen meals. Cooking  Avoid adding salt when cooking. Use salt-free seasonings or herbs instead of table salt or sea salt. Check with your health care provider or pharmacist before using salt substitutes.  Do not fry foods. Cook foods using healthy methods such as baking, boiling, grilling, roasting, and broiling instead.  Cook with heart-healthy oils, such as olive, canola, avocado, soybean, or sunflower oil. Meal planning  Eat a balanced diet that includes: ? 4 or more servings of fruits and 4 or more servings of vegetables each day. Try to fill one-half of your plate with fruits and vegetables. ? 6-8 servings of whole grains each day. ? Less than 6 oz (170 g) of lean meat, poultry, or fish each day. A 3-oz (85-g) serving of meat is about the same size as a deck of cards. One egg equals 1 oz (28 g). ? 2-3 servings of low-fat dairy each day. One serving is 1 cup (237 mL). ? 1 serving of nuts, seeds, or beans 5 times each week. ? 2-3 servings of heart-healthy fats. Healthy fats called omega-3 fatty acids are found in foods such as walnuts, flaxseeds, fortified milks, and eggs. These fats are also found in cold-water fish, such as sardines, salmon, and mackerel.  Limit how much you eat of: ? Canned or prepackaged foods. ? Food that is high in trans fat, such as some fried foods. ? Food that is high in saturated fat, such as fatty meat. ? Desserts and other sweets, sugary drinks, and other foods with added sugar. ? Full-fat dairy products.  Do not salt foods before eating.  Do not eat more than 4 egg yolks a week.  Try to eat at least 2 vegetarian meals a week.  Eat more home-cooked  food and less restaurant, buffet, and fast food.   Lifestyle  When eating at a restaurant, ask that your food be prepared with less salt or no salt, if possible.  If you drink alcohol: ? Limit how much you use to:  0-1 drink a day for women who are not pregnant.  0-2 drinks a day for men. ? Be aware of how much alcohol is in your drink. In the [place] one drink equals one 12 oz bottle of beer (355 mL), one 5 oz glass of wine (148 mL), or one 1 oz glass of hard liquor (44 mL). General information  Avoid eating more than  2,300 mg of salt a day. If you have hypertension, you may need to reduce your sodium intake to 1,500 mg a day.  Work with your health care provider to maintain a healthy body weight or to lose weight. Ask what an ideal weight is for you.  Get at least 30 minutes of exercise that causes your heart to beat faster (aerobic exercise) most days of the week. Activities may include walking, swimming, or biking.  Work with your health care provider or dietitian to adjust your eating plan to your individual calorie needs. What foods should I eat? Fruits All fresh, dried, or frozen fruit. Canned fruit in natural juice (without added sugar). Vegetables Fresh or frozen vegetables (raw, steamed, roasted, or grilled). Low-sodium or reduced-sodium tomato and vegetable juice. Low-sodium or reduced-sodium tomato sauce and tomato paste. Low-sodium or reduced-sodium canned vegetables. Grains Whole-grain or whole-wheat bread. Whole-grain or whole-wheat pasta. Brown rice. [place]. Bulgur. Whole-grain and low-sodium cereals. Pita bread. Low-fat, low-sodium crackers. Whole-wheat flour tortillas. Meats and other proteins Skinless chicken or [place]. Ground chicken or [place]. Pork with fat trimmed off. Fish and seafood. Egg whites. Dried beans, peas, or lentils. Unsalted nuts, nut butters, and seeds. Unsalted canned beans. Lean cuts of beef with fat trimmed off. Low-sodium, lean precooked or  cured meat, such as sausages or meat loaves. Dairy Low-fat (1%) or fat-free (skim) milk. Reduced-fat, low-fat, or fat-free cheeses. Nonfat, low-sodium ricotta or cottage cheese. Low-fat or nonfat yogurt. Low-fat, low-sodium cheese. Fats and oils Soft margarine without trans fats. Vegetable oil. Reduced-fat, low-fat, or light mayonnaise and salad dressings (reduced-sodium). Canola, safflower, olive, avocado, soybean, and sunflower oils. Avocado. Seasonings and condiments Herbs. Spices. Seasoning mixes without salt. Other foods Unsalted popcorn and pretzels. Fat-free sweets. The items listed above may not be a complete list of foods and beverages you can eat. Contact a dietitian for more information. What foods should I avoid? Fruits Canned fruit in a light or heavy syrup. Fried fruit. Fruit in cream or butter sauce. Vegetables Creamed or fried vegetables. Vegetables in a cheese sauce. Regular canned vegetables (not low-sodium or reduced-sodium). Regular canned tomato sauce and paste (not low-sodium or reduced-sodium). Regular tomato and vegetable juice (not low-sodium or reduced-sodium). Angie Fava. Olives. Grains Baked goods made with fat, such as croissants, muffins, or some breads. Dry pasta or rice meal packs. Meats and other proteins Fatty cuts of meat. Ribs. Fried meat. Berniece Salines. Bologna, salami, and other precooked or cured meats, such as sausages or meat loaves. Fat from the back of a pig (fatback). Bratwurst. Salted nuts and seeds. Canned beans with added salt. Canned or smoked fish. Whole eggs or egg yolks. Chicken or [place] with skin. Dairy Whole or 2% milk, cream, and half-and-half. Whole or full-fat cream cheese. Whole-fat or sweetened yogurt. Full-fat cheese. Nondairy creamers. Whipped toppings. Processed cheese and cheese spreads. Fats and oils Butter. Stick margarine. Lard. Shortening. Ghee. Bacon fat. Tropical oils, such as coconut, palm kernel, or palm oil. Seasonings and  condiments Onion salt, garlic salt, seasoned salt, table salt, and sea salt. Worcestershire sauce. Tartar sauce. Barbecue sauce. Teriyaki sauce. Soy sauce, including reduced-sodium. Steak sauce. Canned and packaged gravies. Fish sauce. Oyster sauce. Cocktail sauce. Store-bought horseradish. Ketchup. Mustard. Meat flavorings and tenderizers. Bouillon cubes. Hot sauces. Pre-made or packaged marinades. Pre-made or packaged taco seasonings. Relishes. Regular salad dressings. Other foods Salted popcorn and pretzels. The items listed above may not be a complete list of foods and beverages you should avoid. Contact a dietitian for more  information. Where to find more information  National Heart, Lung, and Blood Institute: https://wilson-eaton.com/  American Heart Association: www.heart.org  Academy of Nutrition and Dietetics: www.eatright.[place]: www.kidney.org Summary  The DASH eating plan is a healthy eating plan that has been shown to reduce high blood pressure (hypertension). It may also reduce your risk for type 2 diabetes, heart disease, and stroke.  When on the DASH eating plan, aim to eat more fresh fruits and vegetables, whole grains, lean proteins, low-fat dairy, and heart-healthy fats.  With the DASH eating plan, you should limit salt (sodium) intake to 2,300 mg a day. If you have hypertension, you may need to reduce your sodium intake to 1,500 mg a day.  Work with your health care provider or dietitian to adjust your eating plan to your individual calorie needs. This information is not intended to replace advice given to you by your health care provider. Make sure you discuss any questions you have with your health care provider. Document Revised: 08/27/2019 Document Reviewed: 08/27/2019 Elsevier Patient Education  2021 [employer].

## 2020-12-11 DIAGNOSIS — K921 Melena: Secondary | ICD-10-CM | POA: Insufficient documentation

## 2020-12-11 DIAGNOSIS — R188 Other ascites: Secondary | ICD-10-CM | POA: Insufficient documentation

## 2020-12-11 DIAGNOSIS — R600 Localized edema: Secondary | ICD-10-CM | POA: Insufficient documentation

## 2020-12-14 ENCOUNTER — Ambulatory Visit: Payer: Self-pay | Admitting: Physician Assistant

## 2020-12-14 ENCOUNTER — Ambulatory Visit: Payer: Self-pay

## 2021-01-02 DIAGNOSIS — K766 Portal hypertension: Secondary | ICD-10-CM | POA: Insufficient documentation

## 2021-01-02 DIAGNOSIS — F1020 Alcohol dependence, uncomplicated: Secondary | ICD-10-CM | POA: Insufficient documentation

## 2021-01-02 DIAGNOSIS — K3189 Other diseases of stomach and duodenum: Secondary | ICD-10-CM | POA: Insufficient documentation

## 2021-01-03 DIAGNOSIS — D6489 Other specified anemias: Secondary | ICD-10-CM | POA: Insufficient documentation

## 2021-01-17 ENCOUNTER — Other Ambulatory Visit: Payer: Self-pay

## 2021-01-17 ENCOUNTER — Encounter
Admission: EM | Disposition: A | Discharge status: Home / Self Care | Payer: Self-pay | Source: Home / Self Care | Attending: Surgery

## 2021-01-17 ENCOUNTER — Encounter: Payer: Self-pay | Admitting: Emergency Medicine

## 2021-01-17 ENCOUNTER — Inpatient Hospital Stay
Admission: EM | Admit: 2021-01-17 | Discharge: 2021-01-19 | DRG: 354 | Disposition: A | Discharge status: Home / Self Care | Payer: BC Managed Care – PPO | Attending: Surgery | Admitting: Surgery

## 2021-01-17 ENCOUNTER — Emergency Department: Payer: BC Managed Care – PPO

## 2021-01-17 ENCOUNTER — Emergency Department: Payer: BC Managed Care – PPO | Admitting: Anesthesiology

## 2021-01-17 DIAGNOSIS — K436 Other and unspecified ventral hernia with obstruction, without gangrene: Secondary | ICD-10-CM | POA: Diagnosis present

## 2021-01-17 DIAGNOSIS — Z20822 Contact with and (suspected) exposure to covid-19: Secondary | ICD-10-CM | POA: Diagnosis present

## 2021-01-17 DIAGNOSIS — I10 Essential (primary) hypertension: Secondary | ICD-10-CM | POA: Diagnosis present

## 2021-01-17 DIAGNOSIS — K219 Gastro-esophageal reflux disease without esophagitis: Secondary | ICD-10-CM | POA: Diagnosis present

## 2021-01-17 DIAGNOSIS — F1011 Alcohol abuse, in remission: Secondary | ICD-10-CM | POA: Diagnosis present

## 2021-01-17 DIAGNOSIS — Z79899 Other long term (current) drug therapy: Secondary | ICD-10-CM | POA: Diagnosis not present

## 2021-01-17 DIAGNOSIS — K420 Umbilical hernia with obstruction, without gangrene: Secondary | ICD-10-CM | POA: Diagnosis present

## 2021-01-17 DIAGNOSIS — E872 Acidosis: Secondary | ICD-10-CM | POA: Diagnosis present

## 2021-01-17 DIAGNOSIS — K7031 Alcoholic cirrhosis of liver with ascites: Secondary | ICD-10-CM | POA: Diagnosis present

## 2021-01-17 DIAGNOSIS — G4730 Sleep apnea, unspecified: Secondary | ICD-10-CM | POA: Diagnosis present

## 2021-01-17 DIAGNOSIS — Z8719 Personal history of other diseases of the digestive system: Secondary | ICD-10-CM | POA: Diagnosis not present

## 2021-01-17 DIAGNOSIS — K430 Incisional hernia with obstruction, without gangrene: Secondary | ICD-10-CM | POA: Diagnosis present

## 2021-01-17 DIAGNOSIS — K766 Portal hypertension: Secondary | ICD-10-CM | POA: Diagnosis present

## 2021-01-17 DIAGNOSIS — Z791 Long term (current) use of non-steroidal anti-inflammatories (NSAID): Secondary | ICD-10-CM | POA: Diagnosis not present

## 2021-01-17 DIAGNOSIS — F17210 Nicotine dependence, cigarettes, uncomplicated: Secondary | ICD-10-CM | POA: Diagnosis present

## 2021-01-17 DIAGNOSIS — Z8249 Family history of ischemic heart disease and other diseases of the circulatory system: Secondary | ICD-10-CM

## 2021-01-17 DIAGNOSIS — E785 Hyperlipidemia, unspecified: Secondary | ICD-10-CM | POA: Diagnosis present

## 2021-01-17 DIAGNOSIS — K56609 Unspecified intestinal obstruction, unspecified as to partial versus complete obstruction: Secondary | ICD-10-CM

## 2021-01-17 DIAGNOSIS — Z7982 Long term (current) use of aspirin: Secondary | ICD-10-CM

## 2021-01-17 HISTORY — PX: VENTRAL HERNIA REPAIR: SHX424

## 2021-01-17 LAB — COMPREHENSIVE METABOLIC PANEL
ALT: 17 U/L (ref 0–44)
AST: 38 U/L (ref 15–41)
Albumin: 3.7 g/dL (ref 3.5–5.0)
Alkaline Phosphatase: 101 U/L (ref 38–126)
Anion gap: 11 (ref 5–15)
BUN: 12 mg/dL (ref 6–20)
CO2: 20 mmol/L — ABNORMAL LOW (ref 22–32)
Calcium: 9.1 mg/dL (ref 8.9–10.3)
Chloride: 105 mmol/L (ref 98–111)
Creatinine, Ser: 0.73 mg/dL (ref 0.61–1.24)
GFR, Estimated: >60 mL/min (ref >60)
Glucose, Bld: 125 mg/dL — ABNORMAL HIGH (ref 70–99)
Potassium: 3.8 mmol/L (ref 3.5–5.1)
Sodium: 136 mmol/L (ref 135–145)
Total Bilirubin: 0.6 mg/dL (ref 0.3–1.2)
Total Protein: 7.9 g/dL (ref 6.5–8.1)

## 2021-01-17 LAB — URINALYSIS, COMPLETE (UACMP) WITH MICROSCOPIC
Bacteria, UA: NONE SEEN
Bilirubin Urine: NEGATIVE
Glucose, UA: NEGATIVE mg/dL
Hgb urine dipstick: NEGATIVE
Ketones, ur: 5 mg/dL — AB
Leukocytes,Ua: NEGATIVE
Nitrite: NEGATIVE
Protein, ur: 30 mg/dL — AB
Specific Gravity, Urine: 1.031 — ABNORMAL HIGH (ref 1.005–1.030)
pH: 6 (ref 5.0–8.0)

## 2021-01-17 LAB — CBC
HCT: 26.8 % — ABNORMAL LOW (ref 39.0–52.0)
Hemoglobin: 8.7 g/dL — ABNORMAL LOW (ref 13.0–17.0)
MCH: 26.4 pg (ref 26.0–34.0)
MCHC: 32.5 g/dL (ref 30.0–36.0)
MCV: 81.2 fL (ref 80.0–100.0)
Platelets: 375 10*3/uL (ref 150–400)
RBC: 3.3 MIL/uL — ABNORMAL LOW (ref 4.22–5.81)
RDW: 16.9 % — ABNORMAL HIGH (ref 11.5–15.5)
WBC: 9.9 10*3/uL (ref 4.0–10.5)
nRBC: 0 % (ref 0.0–0.2)

## 2021-01-17 LAB — RESP PANEL BY RT-PCR (FLU A&B, COVID) ARPGX2
Influenza A by PCR: NEGATIVE
Influenza B by PCR: NEGATIVE
SARS Coronavirus 2 by RT PCR: NEGATIVE

## 2021-01-17 LAB — PROTIME-INR
INR: 1.2 (ref 0.8–1.2)
Prothrombin Time: 15.6 seconds — ABNORMAL HIGH (ref 11.4–15.2)

## 2021-01-17 LAB — LACTIC ACID, PLASMA: Lactic Acid, Venous: 1.1 mmol/L (ref 0.5–1.9)

## 2021-01-17 LAB — LIPASE, BLOOD: Lipase: 45 U/L (ref 11–51)

## 2021-01-17 LAB — ABO/RH: ABO/RH(D): O NEG

## 2021-01-17 SURGERY — REPAIR, HERNIA, VENTRAL
Anesthesia: General

## 2021-01-17 MED ORDER — SODIUM CHLORIDE 0.9 % IV SOLN
INTRAVENOUS | Status: DC | PRN
  Administered 2021-01-17: 30 ug/min via INTRAVENOUS

## 2021-01-17 MED ORDER — CEFAZOLIN SODIUM-DEXTROSE 2-4 GM/100ML-% IV SOLN
2.0000 g | Freq: Once | INTRAVENOUS | Status: AC
  Administered 2021-01-17: 2 g via INTRAVENOUS

## 2021-01-17 MED ORDER — DEXAMETHASONE SODIUM PHOSPHATE 10 MG/ML IJ SOLN
INTRAMUSCULAR | Status: DC | PRN
  Administered 2021-01-17: 10 mg via INTRAVENOUS

## 2021-01-17 MED ORDER — ONDANSETRON HCL 4 MG/2ML IJ SOLN
INTRAMUSCULAR | Status: DC | PRN
  Administered 2021-01-17: 4 mg via INTRAVENOUS

## 2021-01-17 MED ORDER — MIDAZOLAM HCL 2 MG/2ML IJ SOLN
INTRAMUSCULAR | Status: DC | PRN
  Administered 2021-01-17: 2 mg via INTRAVENOUS

## 2021-01-17 MED ORDER — SUGAMMADEX SODIUM 200 MG/2ML IV SOLN: INTRAVENOUS | Status: DC | PRN

## 2021-01-17 MED ORDER — ALBUMIN HUMAN 25 % IV SOLN
25.0000 g | Freq: Once | INTRAVENOUS | Status: AC
  Filled 2021-01-17 (×2): qty 100

## 2021-01-17 MED ORDER — SUGAMMADEX SODIUM 200 MG/2ML IV SOLN
INTRAVENOUS | Status: DC | PRN
  Administered 2021-01-17: 200 mg via INTRAVENOUS

## 2021-01-17 MED ORDER — FUROSEMIDE 20 MG PO TABS
20.0000 mg | ORAL_TABLET | Freq: Three times a day (TID) | ORAL | Status: DC
  Administered 2021-01-17 – 2021-01-19 (×5): 20 mg via ORAL
  Filled 2021-01-17 (×5): qty 1

## 2021-01-17 MED ORDER — FENTANYL CITRATE (PF) 100 MCG/2ML IJ SOLN
INTRAMUSCULAR | Status: AC
  Filled 2021-01-17: qty 4

## 2021-01-17 MED ORDER — HYDROMORPHONE HCL 1 MG/ML IJ SOLN
0.5000 mg | Freq: Once | INTRAMUSCULAR | Status: AC
  Administered 2021-01-17: 0.5 mg via INTRAVENOUS
  Filled 2021-01-17: qty 1

## 2021-01-17 MED ORDER — PHENYLEPHRINE HCL (PRESSORS) 10 MG/ML IV SOLN
INTRAVENOUS | Status: DC | PRN
  Administered 2021-01-17 (×5): 100 ug via INTRAVENOUS

## 2021-01-17 MED ORDER — ALBUMIN HUMAN 5 % IV SOLN
INTRAVENOUS | Status: AC
  Filled 2021-01-17: qty 250

## 2021-01-17 MED ORDER — ROCURONIUM BROMIDE 100 MG/10ML IV SOLN
INTRAVENOUS | Status: DC | PRN
  Administered 2021-01-17: 50 mg via INTRAVENOUS
  Administered 2021-01-17: 10 mg via INTRAVENOUS

## 2021-01-17 MED ORDER — LACTATED RINGERS IV BOLUS
1000.0000 mL | Freq: Once | INTRAVENOUS | Status: AC
  Administered 2021-01-17: 1000 mL via INTRAVENOUS

## 2021-01-17 MED ORDER — BUPIVACAINE LIPOSOME 1.3 % IJ SUSP
INTRAMUSCULAR | Status: DC | PRN
  Administered 2021-01-17: 20 mL

## 2021-01-17 MED ORDER — ONDANSETRON 4 MG PO TBDP
4.0000 mg | ORAL_TABLET | Freq: Once | ORAL | Status: AC | PRN
  Administered 2021-01-17: 4 mg via ORAL

## 2021-01-17 MED ORDER — FENTANYL CITRATE (PF) 100 MCG/2ML IJ SOLN: 25.0000 ug | INTRAMUSCULAR | Status: DC | PRN

## 2021-01-17 MED ORDER — PROPOFOL 10 MG/ML IV BOLUS
INTRAVENOUS | Status: DC | PRN
  Administered 2021-01-17: 200 mg via INTRAVENOUS

## 2021-01-17 MED ORDER — FENTANYL CITRATE (PF) 100 MCG/2ML IJ SOLN
INTRAMUSCULAR | Status: DC | PRN
  Administered 2021-01-17 (×2): 50 ug via INTRAVENOUS
  Administered 2021-01-17: 100 ug via INTRAVENOUS

## 2021-01-17 MED ORDER — PROPOFOL 10 MG/ML IV BOLUS
INTRAVENOUS | Status: AC
  Filled 2021-01-17: qty 20

## 2021-01-17 MED ORDER — BUPIVACAINE-EPINEPHRINE (PF) 0.25% -1:200000 IJ SOLN
INTRAMUSCULAR | Status: DC | PRN
  Administered 2021-01-17: 30 mL via PERINEURAL

## 2021-01-17 MED ORDER — LIDOCAINE HCL (CARDIAC) PF 100 MG/5ML IV SOSY
PREFILLED_SYRINGE | INTRAVENOUS | Status: DC | PRN
  Administered 2021-01-17: 100 mg via INTRAVENOUS

## 2021-01-17 MED ORDER — ONDANSETRON 4 MG PO TBDP
ORAL_TABLET | ORAL | Status: AC
  Filled 2021-01-17: qty 1

## 2021-01-17 MED ORDER — PROMETHAZINE HCL 25 MG/ML IJ SOLN: 6.2500 mg | INTRAMUSCULAR | Status: DC | PRN

## 2021-01-17 MED ORDER — CEFAZOLIN (ANCEF) 1 G IV SOLR: 2.0000 g | INTRAVENOUS | Status: DC

## 2021-01-17 MED ORDER — OXYCODONE HCL 5 MG PO TABS
10.0000 mg | ORAL_TABLET | ORAL | Status: DC | PRN
  Administered 2021-01-17 – 2021-01-18 (×4): 10 mg via ORAL
  Filled 2021-01-17 (×5): qty 2

## 2021-01-17 MED ORDER — LACTATED RINGERS IV SOLN: INTRAVENOUS | Status: DC | PRN

## 2021-01-17 MED ORDER — MIDAZOLAM HCL 2 MG/2ML IJ SOLN
INTRAMUSCULAR | Status: AC
  Filled 2021-01-17: qty 2

## 2021-01-17 MED ORDER — KETOROLAC TROMETHAMINE 30 MG/ML IJ SOLN
30.0000 mg | Freq: Four times a day (QID) | INTRAMUSCULAR | Status: DC
  Administered 2021-01-17 – 2021-01-19 (×6): 30 mg via INTRAVENOUS
  Filled 2021-01-17 (×6): qty 1

## 2021-01-17 MED ORDER — IOHEXOL 300 MG/ML  SOLN
100.0000 mL | Freq: Once | INTRAMUSCULAR | Status: AC | PRN
  Administered 2021-01-17: 100 mL via INTRAVENOUS

## 2021-01-17 MED ORDER — ALBUMIN HUMAN 25 % IV SOLN
12.5000 g | Freq: Three times a day (TID) | INTRAVENOUS | Status: AC
  Administered 2021-01-18 (×4): 12.5 g via INTRAVENOUS
  Filled 2021-01-17 (×4): qty 50

## 2021-01-17 MED ORDER — SODIUM CHLORIDE 0.45 % IV SOLN: INTRAVENOUS | Status: DC

## 2021-01-17 MED ORDER — MORPHINE SULFATE (PF) 4 MG/ML IV SOLN
4.0000 mg | Freq: Once | INTRAVENOUS | Status: AC
  Administered 2021-01-17: 4 mg via INTRAVENOUS
  Filled 2021-01-17: qty 1

## 2021-01-17 MED ORDER — SUCCINYLCHOLINE CHLORIDE 20 MG/ML IJ SOLN
INTRAMUSCULAR | Status: DC | PRN
  Administered 2021-01-17: 120 mg via INTRAVENOUS

## 2021-01-17 MED ORDER — SPIRONOLACTONE 25 MG PO TABS
50.0000 mg | ORAL_TABLET | Freq: Two times a day (BID) | ORAL | Status: DC
  Administered 2021-01-17 – 2021-01-19 (×4): 50 mg via ORAL
  Filled 2021-01-17 (×4): qty 2

## 2021-01-17 MED ORDER — ALBUMIN HUMAN 5 % IV SOLN: INTRAVENOUS | Status: DC | PRN

## 2021-01-17 MED ORDER — OXYCODONE HCL 5 MG PO TABS
5.0000 mg | ORAL_TABLET | ORAL | Status: DC | PRN
  Administered 2021-01-18 – 2021-01-19 (×3): 5 mg via ORAL
  Filled 2021-01-17 (×3): qty 1

## 2021-01-17 MED ORDER — MORPHINE SULFATE (PF) 2 MG/ML IV SOLN: 2.0000 mg | INTRAVENOUS | Status: DC | PRN

## 2021-01-17 MED ORDER — CEFAZOLIN SODIUM-DEXTROSE 2-4 GM/100ML-% IV SOLN
2.0000 g | Freq: Three times a day (TID) | INTRAVENOUS | Status: AC
  Administered 2021-01-18 (×2): 2 g via INTRAVENOUS
  Filled 2021-01-17 (×2): qty 100

## 2021-01-17 SURGICAL SUPPLY — 37 items
ADH SKN CLS APL DERMABOND .7 (GAUZE/BANDAGES/DRESSINGS) ×1
APL PRP STRL LF DISP 70% ISPRP (MISCELLANEOUS) ×1
BINDER ABDOMINAL 12 ML 46-62 (SOFTGOODS) ×1 IMPLANT
BLADE CLIPPER SURG (BLADE) ×2 IMPLANT
BULB RESERV EVAC DRAIN JP 100C (MISCELLANEOUS) ×1 IMPLANT
CANISTER SUCT 3000ML PPV (MISCELLANEOUS) ×2 IMPLANT
CHLORAPREP W/TINT 26 (MISCELLANEOUS) ×2 IMPLANT
COVER WAND RF STERILE (DRAPES) ×2 IMPLANT
DERMABOND ADVANCED (GAUZE/BANDAGES/DRESSINGS) ×1
DERMABOND ADVANCED .7 DNX12 (GAUZE/BANDAGES/DRESSINGS) IMPLANT
DRAIN CHANNEL JP 19F (MISCELLANEOUS) ×1 IMPLANT
DRAPE LAPAROTOMY 100X77 ABD (DRAPES) ×2 IMPLANT
DRSG TELFA 3X8 NADH (GAUZE/BANDAGES/DRESSINGS) ×2 IMPLANT
ELECT REM PT RETURN 9FT ADLT (ELECTROSURGICAL) ×2
ELECTRODE REM PT RTRN 9FT ADLT (ELECTROSURGICAL) ×1 IMPLANT
GAUZE SPONGE 4X4 12PLY STRL (GAUZE/BANDAGES/DRESSINGS) ×2 IMPLANT
GLOVE SURG ENC MOIS LTX SZ7 (GLOVE) ×2 IMPLANT
GOWN STRL REUS W/ TWL LRG LVL3 (GOWN DISPOSABLE) ×2 IMPLANT
GOWN STRL REUS W/TWL LRG LVL3 (GOWN DISPOSABLE) ×6
GRASPER SUT TROCAR 14GX15 (MISCELLANEOUS) ×1 IMPLANT
MANIFOLD NEPTUNE II (INSTRUMENTS) ×2 IMPLANT
MESH PHASIX ST 10X15 (Mesh General) ×1 IMPLANT
NDL HYPO 25X1 1.5 SAFETY (NEEDLE) ×1 IMPLANT
NEEDLE HYPO 22GX1.5 SAFETY (NEEDLE) ×2 IMPLANT
NEEDLE HYPO 25X1 1.5 SAFETY (NEEDLE) ×2 IMPLANT
PACK BASIN MINOR ARMC (MISCELLANEOUS) ×2 IMPLANT
PAD DRESSING TELFA 3X8 NADH (GAUZE/BANDAGES/DRESSINGS) ×1 IMPLANT
SPONGE DRAIN TRACH 4X4 STRL 2S (GAUZE/BANDAGES/DRESSINGS) ×1 IMPLANT
SPONGE LAP 18X18 RF (DISPOSABLE) ×2 IMPLANT
SUT ETHIBOND 0 MO6 C/R (SUTURE) ×2 IMPLANT
SUT MNCRL 4-0 (SUTURE) ×2
SUT MNCRL 4-0 27XMFL (SUTURE) ×1
SUT VIC AB 2-0 SH 27 (SUTURE) ×4
SUT VIC AB 2-0 SH 27XBRD (SUTURE) ×2 IMPLANT
SUTURE MNCRL 4-0 27XMF (SUTURE) IMPLANT
SYR 20ML LL LF (SYRINGE) ×2 IMPLANT
TAPE MICROFOAM 4IN (TAPE) ×2 IMPLANT

## 2021-01-17 NOTE — H&P (Signed)
Patient ID: Omar Turner, male   DOB: September 25, 1971, 50 y.o.   MRN: [id number]  HPI Omar Turner is a 50 y.o. male seen in consultation at request of Dr. Charna Archer.  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  More recently was hospitalized at [facility] for about a week and require paracentesis and medical optimization with diuretics.  Last paracentesis was several weeks ago.  He now comes with an acute abdominal pain and a bulge within the abdomen.  Pain is severe worsening with movement and he also has associated nausea and decreased appetite.  No fevers no chills.  No altered mental status.  He did have a CT scan that have personally reviewed showing evidence of ascites, cirrhosis portal hypertension and evidence of an incarcerated ventral hernia with evidence of bowel obstruction.  No evidence of pneumatosis no evidence of free air. Laboratory data shows a hemoglobin of 8.7, normal platelet count and normal white count.  BMP shows mild acidosis and LFTs are completely normal.  Has been normalization of the total bilirubin.  INR is pending at this time. Of note he did have a history of laparoscopic appendectomy.  He also had a history of diverticulitis with perforation that was managed clinically.  I actually saw him couple years ago but he lost follow-up.  He did have a colonoscopy 3 years ago with a few polyps. Used to drink about a quart a day last drink was about 2 months ago.  He has been taking the spironolactone and Lasix.  He denies any lower extremity edema.  HPI  Past Medical History:  Diagnosis Date  . Anginal pain ([place])   . Anxiety   . Degenerative disc disease, cervical   . Diverticulitis large intestine   . GERD (gastroesophageal reflux disease)   . History of chest pain   . Hyperlipidemia   . Hypertension   . Sleep apnea   . Tobacco abuse     Past Surgical History:  Procedure Laterality Date  . APPENDECTOMY    . CARDIAC CATHETERIZATION  Left 06/17/2016   Procedure: Left Heart Cath and Coronary Angiography;  Surgeon: Wellington Hampshire, MD;  Location: [place] [facility] LAB;  Service: Cardiovascular;  Laterality: Left;  . COLONOSCOPY    . COLONOSCOPY WITH PROPOFOL N/A 09/07/2015   Procedure: COLONOSCOPY WITH PROPOFOL;  Surgeon: Hulen Luster, MD;  Location: [facility] ENDOSCOPY;  Service: Gastroenterology;  Laterality: N/A;  . COLONOSCOPY WITH PROPOFOL N/A 05/12/2018   Procedure: COLONOSCOPY WITH PROPOFOL;  Surgeon: Jonathon Bellows, MD;  Location: [facility] ENDOSCOPY;  Service: Gastroenterology;  Laterality: N/A;  . HERNIA REPAIR      Family History  Problem Relation Age of Onset  . Hypertension Father   . Heart disease Father        CABG x 3 & valve replacement.   [name] Heart attack Father     Social History Social History   Tobacco Use  . Smoking status: Current Every Day Smoker    Packs/day: 1.50    Years: 15.00    Pack years: 22.50  . Smokeless tobacco: Never Used  Vaping Use  . Vaping Use: Never used  Substance Use Topics  . Alcohol use: Yes    Alcohol/week: 2.0 standard drinks    Types: 2 Shots of liquor per week    Comment: per day  . Drug use: No    Allergies  Allergen Reactions  . Chantix [Varenicline]   . Lexapro [  Escitalopram]          Current Facility-Administered Medications  Medication Dose Route Frequency Provider Last Rate Last Admin  . HYDROmorphone (DILAUDID) injection 0.5 mg  0.5 mg Intravenous Once Blake Divine, MD      . lactated ringers bolus 1,000 mL  1,000 mL Intravenous Once Blake Divine, MD      . ondansetron (ZOFRAN-ODT) 4 MG disintegrating tablet            Current Outpatient Medications  Medication Sig Dispense Refill  . amLODipine (NORVASC) 5 MG tablet Take 1 tablet (5 mg total) by mouth daily for 15 days. 15 tablet 1  . aspirin EC 81 MG tablet Take 1 tablet (81 mg total) by mouth daily. 90 tablet 3  . hydrochlorothiazide (HYDRODIURIL) 25 MG tablet Take 1 tablet (25 mg total) by mouth daily  for 15 days. 15 tablet 0  . ibuprofen (ADVIL) 600 MG tablet Take 1 tablet (600 mg total) by mouth every 8 (eight) hours as needed. 15 tablet 0  . meloxicam (MOBIC) 15 MG tablet Take 1 tablet (15 mg total) by mouth daily. 30 tablet 0  . oxyCODONE-acetaminophen (PERCOCET/ROXICET) 5-325 MG tablet Take 1 tablet by mouth every 6 (six) hours as needed for severe pain. 20 tablet 0  . pantoprazole (PROTONIX) 40 MG tablet Take 1 tablet (40 mg total) by mouth daily for 15 doses. 15 tablet 1     Review of Systems Full ROS  was asked and was negative except for the information on the HPI  Physical Exam Blood pressure 127/86, pulse 95, temperature 97.9 F (36.6 C), temperature source Oral, resp. rate (!) 22, height 5\' 11"  (1.803 m), weight 93 kg, SpO2 93 %. CONSTITUTIONAL: NAD EYES: Pupils are equal, round,  Sclera are non-icteric. EARS, NOSE, MOUTH AND THROAT: he is wearing a mask,  Hearing is intact to voice. LYMPH NODES:  Lymph nodes in the neck are normal. RESPIRATORY:  Lungs are clear. There is normal respiratory effort, with equal breath sounds bilaterally, and without pathologic use of accessory muscles. CARDIOVASCULAR: Heart is regular without murmurs, gallops, or rubs. GI: The abdomen is Soft, there is a ventral hernia that is not reducible, exquisitely tender to palpation.  There is no hepatosplenomegaly. There are normal bowel sounds in all quadrants. GU: Rectal deferred.   MUSCULOSKELETAL: Normal muscle strength and tone. No cyanosis or edema.   SKIN: Turgor is good and there are no pathologic skin lesions or ulcers. NEUROLOGIC: Motor and sensation is grossly normal. Cranial nerves are grossly intact. PSYCH:  Oriented to person, place and time. Affect is normal.  Data Reviewed  I have personally reviewed the patient's imaging, laboratory findings and medical records.    Assessment/Plan 50 year old male with incarcerated possibly strangulated ventral hernia with a piece of bowel and a  bowel obstruction.  In addition to that he does have cirrhosis with ascites and portal hypertension and a child score of B. This is a very difficult situation without any good answers.  Given his exquisite tenderness to palpation and evidence of bowel obstruction I do think that damage control surgery will be in his best interest.  We will proceed to the operating room for hernia repair possible mesh depending on operative findings and likely drain placement to control the ascites.  Perioperatively he likely benefit from albumin and may require diuresis.  We will definitely involve GI to help [place] manage his cirrhosis in the immediate perioperative period.  We will also type and cross him  for 2 units of blood.  He understands that we may have to give him blood and other blood products.  Discussed with the patient in detail.  Risks, benefits and possible applications including but not limited to: Bleeding, infection, recurrence, leak bursitis that might be chronic and agreed mesh infection and other issues.  Have also discussed with him potential transfer to a tertiary center but I do think that this may delay his care given his acuity of his abdomen.  Time spent with the patient was 65 minutes, with more than 50% of the time spent in face-to-face education, counseling and care coordination.     Caroleen Hamman, MD FACS General Surgeon 01/17/2021, 6:18 PM

## 2021-01-17 NOTE — Transfer of Care (Signed)
Immediate Anesthesia Transfer of Care Note  Patient: Omar Turner  Procedure(s) Performed: HERNIA REPAIR VENTRAL ADULT (N/A )  Patient Location: PACU  Anesthesia Type:General  Level of Consciousness: awake, alert , oriented and patient cooperative  Airway & Oxygen Therapy: Patient Spontanous Breathing and Patient connected to face mask oxygen  Post-op Assessment: Report given to RN and Post -op Vital signs reviewed and stable  Post vital signs: Reviewed and stable  Last Vitals:  Vitals Value Taken Time  BP    Temp    Pulse 104 01/17/21 2104  Resp 21 01/17/21 2104  SpO2 100 % 01/17/21 2104  Vitals shown include unvalidated device data.  Last Pain:  Vitals:   01/17/21 1456  TempSrc: Oral  PainSc:          Complications: No complications documented.

## 2021-01-17 NOTE — ED Notes (Signed)
Patient transported to CT 

## 2021-01-17 NOTE — Anesthesia Procedure Notes (Signed)
Procedure Name: Intubation Date/Time: 01/17/2021 7:05 PM Performed by: Nelda Marseille, CRNA Pre-anesthesia Checklist: Patient identified, Patient being monitored, Timeout performed, Emergency Drugs available and Suction available Patient Re-evaluated:Patient Re-evaluated prior to induction Oxygen Delivery Method: Circle system utilized Preoxygenation: Pre-oxygenation with 100% oxygen Induction Type: IV induction Ventilation: Mask ventilation without difficulty Laryngoscope Size: Mac and 3 Grade View: Grade II Tube type: Oral Tube size: 7.5 mm Number of attempts: 1 Airway Equipment and Method: Stylet Placement Confirmation: ETT inserted through vocal cords under direct vision,  positive ETCO2 and breath sounds checked- equal and bilateral Secured at: 21 cm Tube secured with: Tape Dental Injury: Teeth and Oropharynx as per pre-operative assessment

## 2021-01-17 NOTE — Op Note (Addendum)
Incarcerated Incisional Hernia Repair with Mesh Phasix ST Placement of 19 FR blake drain Pelvis  Pre-operative Diagnosis: incarcerated Incisional hernia  Post-operative Diagnosis: same  Surgeon: Caroleen Hamman, MD FACS  Anesthesia: Gen. with endotracheal tube   Findings: 2 cm incarcerated hernia causing a complete bowel obstruction with a knuckle of small bowel. Bowel had a small contusion from the incarceration but was viable and w/o necrosis or ischemia 2 lts of ascites Liver cirrhosis  Estimated Blood Loss: 25cc                Specimens: sac          Complications: none              Procedure Details  The patient was seen again in the Holding Room. The benefits, complications, treatment options, and expected outcomes were discussed with the patient. The risks of bleeding, infection, recurrence of symptoms, failure to resolve symptoms, bowel injury, mesh placement, mesh infection, any of which could require further surgery were reviewed with the patient. The likelihood of improving the patient's symptoms with return to their baseline status is good.  The patient and/or family concurred with the proposed plan, giving informed consent.  The patient was taken to Operating Room, identified and the procedure verified.  A Time Out was held and the above information confirmed.  Prior to the induction of general anesthesia, antibiotic prophylaxis was administered. VTE prophylaxis was in place. General endotracheal anesthesia was then administered and tolerated well. After the induction, the abdomen was prepped with Chloraprep and draped in the sterile fashion. The patient was positioned in the supine position.   Incision was created with a scalpel over the hernia defect. Electrocautery was used to dissect through subcutaneous tissue, the hernia sac was opened excised. There was a knuckle of small bowel that was incarcerated , I incised the fascia about 5 mm to be able to reduce the bowel. The  bowel turned pink and viable. No evidence of necrosis. I decided not to do a bowel resection .   I placed the drain under direct visualization in the pelvis and secured it using 3-0 nylon. I elected to use  Phasix ST and I tailored a 6.5 cm round piece of mesh. Using the PMI device four trans fascial sutures were used to secured the mesh towards the abdominal wall , we used 0 Ethibond sutures ion all four corners.  I closed the hernia defect with interrupted 0 Ethibond sutures.   Incision was closed in a 2 layer fashion with 3-0 Vicryl and 4-0 Monocryl. Dermabond was used to coat the skin. Liposomal Marcaine was used to inject all the incision sites and performed TAP block. Patient tolerated procedure well and there were no immediate complications. Needle and laparotomy counts were correct

## 2021-01-17 NOTE — Anesthesia Postprocedure Evaluation (Signed)
Anesthesia Post Note  Patient: Omar Turner  Procedure(s) Performed: HERNIA REPAIR VENTRAL ADULT (N/A )  Patient location during evaluation: PACU Anesthesia Type: General Level of consciousness: awake and alert Pain management: pain level controlled Vital Signs Assessment: post-procedure vital signs reviewed and stable Respiratory status: spontaneous breathing, nonlabored ventilation, respiratory function stable and patient connected to nasal cannula oxygen Cardiovascular status: blood pressure returned to baseline and stable Postop Assessment: no apparent nausea or vomiting Anesthetic complications: no   No complications documented.   Last Vitals:  Vitals:   01/17/21 2200 01/17/21 2247  BP: 121/86 119/79  Pulse: 92 91  Resp: 16 18  Temp: 36.7 C 36.6 C  SpO2: 97% 97%    Last Pain:  Vitals:   01/17/21 2247  TempSrc: Oral  PainSc:                  Martha Clan

## 2021-01-17 NOTE — Anesthesia Preprocedure Evaluation (Signed)
Anesthesia Evaluation  Patient identified by MRN, date of birth, ID band Patient awake    Reviewed: Allergy & Precautions, NPO status , Patient's Chart, lab work & pertinent test results  History of Anesthesia Complications Negative for: history of anesthetic complications  Airway Mallampati: II       Dental  (+) Chipped, Teeth Intact, Dental Advidsory Given   Pulmonary neg shortness of breath, sleep apnea , COPD, neg recent URI, Current Smoker,           Cardiovascular Exercise Tolerance: Good hypertension, Pt. on medications (-) angina(-) CAD, (-) Past MI, (-) Cardiac Stents and (-) CABG (-) dysrhythmias (-) Valvular Problems/Murmurs Rhythm:Regular Rate:Normal     Neuro/Psych    GI/Hepatic GERD  ,(+) Cirrhosis       , NAFLD and prior alcoholism   Endo/Other  negative endocrine ROS  Renal/GU negative Renal ROS     Musculoskeletal   Abdominal Normal abdominal exam  (+)   Peds  Hematology negative hematology ROS (+)   Anesthesia Other Findings Past Medical History: No date: Anginal pain ([place]) No date: Anxiety No date: Degenerative disc disease, cervical No date: Diverticulitis large intestine No date: GERD (gastroesophageal reflux disease) No date: History of chest pain No date: Hyperlipidemia No date: Hypertension No date: Sleep apnea No date: Tobacco abuse    Reproductive/Obstetrics                             Anesthesia Physical  Anesthesia Plan  ASA: III and emergent  Anesthesia Plan: General   Post-op Pain Management:    Induction: Intravenous, Rapid sequence and Cricoid pressure planned  PONV Risk Score and Plan: 1 and Ondansetron, Dexamethasone, Midazolam and Promethazine  Airway Management Planned: Oral ETT  Additional Equipment:   Intra-op Plan:   Post-operative Plan: Extubation in OR  Informed Consent: I have reviewed the patients History and Physical,  chart, labs and discussed the procedure including the risks, benefits and alternatives for the proposed anesthesia with the patient or authorized representative who has indicated his/her understanding and acceptance.       Plan Discussed with: CRNA  Anesthesia Plan Comments:         Anesthesia Quick Evaluation

## 2021-01-17 NOTE — ED Provider Notes (Signed)
[facility] Emergency Department Provider Note   ____________________________________________   Event Date/Time   First MD Initiated Contact with Patient 01/17/21 1613     (approximate)  I have reviewed the triage vital signs and the nursing notes.   HISTORY  Chief Complaint Abdominal Pain    HPI Omar Turner is a 50 y.o. male with past medical history of hypertension, hyperlipidemia, GERD, alcohol abuse, and cirrhosis who presents to the ED complaining of abdominal pain.  Patient reports that overnight he developed increasing pain starting around his umbilicus.  Pain has gradually worsened since then, is not exacerbated or alleviated by anything.  He has a history of umbilical hernia and is typically able to reduce it, but states he has not been able to do so today.  He has had a couple episodes of vomiting and feels nauseous, denies any blood in his emesis or stool.  He was recently diagnosed with cirrhosis requiring paracentesis at [facility] last month.  Abdomen has become slightly more swollen since then but he denies any fevers.  He has quit drinking alcohol following his admission to [facility].        Past Medical History:  Diagnosis Date  . Anginal pain ([place])   . Anxiety   . Degenerative disc disease, cervical   . Diverticulitis large intestine   . GERD (gastroesophageal reflux disease)   . History of chest pain   . Hyperlipidemia   . Hypertension   . Sleep apnea   . Tobacco abuse     Patient Active Problem List   Diagnosis Date Noted  . DDD (degenerative disc disease), cervical 03/20/2018  . Hypertension 03/20/2018  . Sleep apnea 03/20/2018  . Diverticulitis of large intestine with perforation without abscess or bleeding 03/15/2018  . Gastroesophageal reflux disease without esophagitis 04/03/2017  . History of chest pain 04/03/2017  . Other hyperlipidemia 04/03/2017  . Precordial pain   . Tobacco abuse 08/21/2015  . Anxiety state 02/15/2015     Past Surgical History:  Procedure Laterality Date  . APPENDECTOMY    . CARDIAC CATHETERIZATION Left 06/17/2016   Procedure: Left Heart Cath and Coronary Angiography;  Surgeon: Wellington Hampshire, MD;  Location: [place] [facility];  Service: Cardiovascular;  Laterality: Left;  . COLONOSCOPY    . COLONOSCOPY WITH PROPOFOL N/A 09/07/2015   Procedure: COLONOSCOPY WITH PROPOFOL;  Surgeon: Hulen Luster, MD;  Location: [facility] ENDOSCOPY;  Service: Gastroenterology;  Laterality: N/A;  . COLONOSCOPY WITH PROPOFOL N/A 05/12/2018   Procedure: COLONOSCOPY WITH PROPOFOL;  Surgeon: Jonathon Bellows, MD;  Location: [facility] ENDOSCOPY;  Service: Gastroenterology;  Laterality: N/A;  . HERNIA REPAIR      Prior to Admission medications   Medication Sig Start Date End Date Taking? Authorizing Provider  amLODipine (NORVASC) 5 MG tablet Take 1 tablet (5 mg total) by mouth daily for 15 days. 12/08/20 12/23/20  Jan Fireman, PA-C  aspirin EC 81 MG tablet Take 1 tablet (81 mg total) by mouth daily. 06/12/16   Wende Bushy, MD  hydrochlorothiazide (HYDRODIURIL) 25 MG tablet Take 1 tablet (25 mg total) by mouth daily for 15 days. 12/08/20 12/23/20  Jan Fireman, PA-C  ibuprofen (ADVIL) 600 MG tablet Take 1 tablet (600 mg total) by mouth every 8 (eight) hours as needed. 11/25/19   Sable Feil, PA-C  meloxicam (MOBIC) 15 MG tablet Take 1 tablet (15 mg total) by mouth daily. 11/30/19   Cuthriell, Charline Bills, PA-C  oxyCODONE-acetaminophen (PERCOCET/ROXICET) 5-325 MG  tablet Take 1 tablet by mouth every 6 (six) hours as needed for severe pain. 11/30/19   Cuthriell, Charline Bills, PA-C  pantoprazole (PROTONIX) 40 MG tablet Take 1 tablet (40 mg total) by mouth daily for 15 doses. 12/08/20 12/23/20  Jan Fireman, PA-C    Allergies Chantix [varenicline] and Lexapro [escitalopram]  Family History  Problem Relation Age of Onset  . Hypertension Father   . Heart disease Father        CABG x 3 & valve replacement.   [name] Heart attack Father      Social History Social History   Tobacco Use  . Smoking status: Current Every Day Smoker    Packs/day: 1.50    Years: 15.00    Pack years: 22.50  . Smokeless tobacco: Never Used  Vaping Use  . Vaping Use: Never used  Substance Use Topics  . Alcohol use: Yes    Alcohol/week: 2.0 standard drinks    Types: 2 Shots of liquor per week    Comment: per day  . Drug use: No    Review of Systems  Constitutional: No fever/chills Eyes: No visual changes. ENT: No sore throat. Cardiovascular: Denies chest pain. Respiratory: Denies shortness of breath. Gastrointestinal: Positive for abdominal pain, nausea, and vomiting.  No diarrhea.  No constipation. Genitourinary: Negative for dysuria. Musculoskeletal: Negative for back pain. Skin: Negative for rash. Neurological: Negative for headaches, focal weakness or numbness.  ____________________________________________   PHYSICAL EXAM:  VITAL SIGNS: ED Triage Vitals  Enc Vitals Group     BP 01/17/21 1456 101/63     Pulse Rate 01/17/21 1456 82     Resp 01/17/21 1456 (!) 22     Temp 01/17/21 1456 97.9 F (36.6 C)     Temp Source 01/17/21 1456 Oral     SpO2 01/17/21 1456 100 %     Weight 01/17/21 1453 205 lb (93 kg)     Height 01/17/21 1453 5\' 11"  (1.803 m)     Head Circumference --      Peak Flow --      Pain Score 01/17/21 1453 10     Pain Loc --      Pain Edu? --      Excl. in [place]? --     Constitutional: Alert and oriented. Eyes: Conjunctivae are normal. Head: Atraumatic. Nose: No congestion/rhinnorhea. Mouth/Throat: Mucous membranes are moist. Neck: Normal ROM Cardiovascular: Normal rate, regular rhythm. Grossly normal heart sounds. Respiratory: Normal respiratory effort.  No retractions. Lungs CTAB. Gastrointestinal: Soft and diffusely tender to palpation, focally tender at nonreducible umbilical hernia.  Moderately distended with fluid wave. Genitourinary: deferred Musculoskeletal: No lower extremity tenderness nor  edema. Neurologic:  Normal speech and language. No gross focal neurologic deficits are appreciated. Skin:  Skin is warm, dry and intact. No rash noted. Psychiatric: Mood and affect are normal. Speech and behavior are normal.  ____________________________________________   LABS (all labs ordered are listed, but only abnormal results are displayed)  Labs Reviewed  COMPREHENSIVE METABOLIC PANEL - Abnormal; Notable for the following components:      Result Value   CO2 20 (*)    Glucose, Bld 125 (*)    All other components within normal limits  CBC - Abnormal; Notable for the following components:   RBC 3.30 (*)    Hemoglobin 8.7 (*)    HCT 26.8 (*)    RDW 16.9 (*)    All other components within normal limits  URINALYSIS, COMPLETE (UACMP) WITH MICROSCOPIC -  Abnormal; Notable for the following components:   Color, Urine AMBER (*)    APPearance HAZY (*)    Specific Gravity, Urine 1.031 (*)    Ketones, ur 5 (*)    Protein, ur 30 (*)    All other components within normal limits  RESP PANEL BY RT-PCR (FLU A&B, COVID) ARPGX2  LIPASE, BLOOD  LACTIC ACID, PLASMA  LACTIC ACID, PLASMA  PROTIME-INR  TYPE AND SCREEN  PREPARE RBC (CROSSMATCH)  ABO/RH    PROCEDURES  Procedure(s) performed (including Critical Care):  .Critical Care Performed by: Blake Divine, MD Authorized by: Blake Divine, MD   Critical care provider statement:    Critical care time (minutes):  45   Critical care time was exclusive of:  Separately billable procedures and treating other patients and teaching time   Critical care was necessary to treat or prevent imminent or life-threatening deterioration of the following conditions:  Circulatory failure and hepatic failure   Critical care was time spent personally by me on the following activities:  Discussions with consultants, evaluation of patient's response to treatment, examination of patient, ordering and performing treatments and interventions, ordering  and review of laboratory studies, ordering and review of radiographic studies, pulse oximetry, re-evaluation of patient's condition, obtaining history from patient or surrogate and review of old charts   I assumed direction of critical care for this patient from another provider in my specialty: no     Care discussed with: admitting provider       ____________________________________________   INITIAL IMPRESSION / [place] / ED COURSE       50 year old male with past medical history of hypertension, hyperlipidemia, GERD, alcohol abuse, and cirrhosis who presents to the ED with acute abdominal pain developing overnight with associated nausea and vomiting.  He does have umbilical hernia that is nonreducible on exam and significantly tender.  Labs thus far are reassuring, LFTs within normal limits and lactate also normal, low suspicion for strangulation.  This was further assessed with CT scan, which shows likely incarcerated umbilical hernia with signs concerning for proximal bowel obstruction.  Case discussed with Dr. Dahlia Byes of general surgery, who will admit the patient and plan for surgical intervention later this evening.  Patient with minimal relief following IV morphine, we will treat with IV Dilaudid.      ____________________________________________   FINAL CLINICAL IMPRESSION(S) / ED DIAGNOSES  Final diagnoses:  Incarcerated umbilical hernia  SBO (small bowel obstruction) [facility])     ED Discharge Orders    None       Note:  This document was prepared using Dragon voice recognition software and may include unintentional dictation errors.   Blake Divine, MD 01/17/21 1827

## 2021-01-17 NOTE — ED Triage Notes (Signed)
Pt comes into the ED via POV c/o right side abdominal pain.  Pt has known h/o cirrhosis of the liver and hernia.  Pt states the pain started last night.  Pt denies any vomiting or diarrhea but does have some nausea.  Pt denies any CP or SHOB.

## 2021-01-18 ENCOUNTER — Encounter: Payer: Self-pay | Admitting: Surgery

## 2021-01-18 LAB — CBC
HCT: 24.7 % — ABNORMAL LOW (ref 39.0–52.0)
Hemoglobin: 8 g/dL — ABNORMAL LOW (ref 13.0–17.0)
MCH: 26.6 pg (ref 26.0–34.0)
MCHC: 32.4 g/dL (ref 30.0–36.0)
MCV: 82.1 fL (ref 80.0–100.0)
Platelets: 278 10*3/uL (ref 150–400)
RBC: 3.01 MIL/uL — ABNORMAL LOW (ref 4.22–5.81)
RDW: 16.8 % — ABNORMAL HIGH (ref 11.5–15.5)
WBC: 9.1 10*3/uL (ref 4.0–10.5)
nRBC: 0 % (ref 0.0–0.2)

## 2021-01-18 LAB — COMPREHENSIVE METABOLIC PANEL
ALT: 13 U/L (ref 0–44)
AST: 26 U/L (ref 15–41)
Albumin: 3.6 g/dL (ref 3.5–5.0)
Alkaline Phosphatase: 74 U/L (ref 38–126)
Anion gap: 9 (ref 5–15)
BUN: 10 mg/dL (ref 6–20)
CO2: 23 mmol/L (ref 22–32)
Calcium: 8.7 mg/dL — ABNORMAL LOW (ref 8.9–10.3)
Chloride: 103 mmol/L (ref 98–111)
Creatinine, Ser: 0.63 mg/dL (ref 0.61–1.24)
GFR, Estimated: >60 mL/min (ref >60)
Glucose, Bld: 134 mg/dL — ABNORMAL HIGH (ref 70–99)
Potassium: 4 mmol/L (ref 3.5–5.1)
Sodium: 135 mmol/L (ref 135–145)
Total Bilirubin: 0.8 mg/dL (ref 0.3–1.2)
Total Protein: 7.3 g/dL (ref 6.5–8.1)

## 2021-01-18 MED ORDER — NICOTINE 21 MG/24HR TD PT24
21.0000 mg | MEDICATED_PATCH | Freq: Every day | TRANSDERMAL | Status: DC
  Administered 2021-01-18 – 2021-01-19 (×3): 21 mg via TRANSDERMAL
  Filled 2021-01-18 (×3): qty 1

## 2021-01-18 MED ORDER — CHLORHEXIDINE GLUCONATE CLOTH 2 % EX PADS
6.0000 | MEDICATED_PAD | Freq: Every day | CUTANEOUS | Status: DC
  Administered 2021-01-18 – 2021-01-19 (×2): 6 via TOPICAL

## 2021-01-18 NOTE — Progress Notes (Signed)
Brief Progress note Patient seen and examined; POD1 from incarcerated ventral hernia repair  With expected abdominal soreness No fever, chills, nausea, emesis Has not had full liquids yet but feels hungry Surgical drain with expected high serous output; consistent with ascites  Incisions is CDI with dermabond Abdomen is excepted sore, non-distended, no rebound guarding Surgical drain in LLQ with serous output  Plan:  ADAT; okay for soft diet if tolerates full liquids for breakfast Monitor surgical drain output; this will be high given his cirrhosis and will likely stay in place for multiple days Monitor abdominal examination Pain control prn; antiemetics prn Mobilization encouraged  Will reassess later today for potential DC  -- Edison Simon, PA-C [facility] 01/18/2021, 9:08 AM [phone] M-F: 7am - 4pm

## 2021-01-18 NOTE — Discharge Summary (Signed)
[facility] SURGICAL DISCHARGE SUMMARY  Patient ID: Omar Turner MRN: [id number] DOB/AGE: 1971/02/08 50 y.o.  Admit date: 01/17/2021 Discharge date: 01/19/2021  Discharge Diagnoses Patient Active Problem List   Diagnosis Date Noted  . Incarcerated ventral hernia 01/17/2021    Consultants None  Procedures 01/17/2021:  Incarcerated incisional hernia repair   HPI: Omar Turner is a 50 y.o. male seen in consultation at request of Dr. Charna Archer.  Does have a history of compensated cirrhosis that it was attributed to EtOH abuse.  More recently was hospitalized at [facility] for about a week and require paracentesis and medical optimization with diuretics.  Last paracentesis was several weeks ago.  He now comes with an acute abdominal pain and a bulge within the abdomen.  Pain is severe worsening with movement and he also has associated nausea and decreased appetite.  No fevers no chills.  No altered mental status.  He did have a CT scan that have personally reviewed showing evidence of ascites, cirrhosis portal hypertension and evidence of an incarcerated ventral hernia with evidence of bowel obstruction.  No evidence of pneumatosis no evidence of free air. Laboratory data shows a hemoglobin of 8.7, normal platelet count and normal white count.  BMP shows mild acidosis and LFTs are completely normal.  Has been normalization of the total bilirubin.  INR is pending at this time. Of note he did have a history of laparoscopic appendectomy.  He also had a history of diverticulitis with perforation that was managed clinically.  I actually saw him couple years ago but he lost follow-up.  He did have a colonoscopy 3 years ago with a few polyps. Used to drink about a quart a day last drink was about 2 months ago.  He has been taking the spironolactone and Lasix.  He denies any lower extremity edema   Hospital Course: Informed consent was obtained and documented, and patient underwent uneventful  repair of incarcerated incisional hernia (Dr Dahlia Byes, 01/17/2021).  Post-operatively, patient did reasonably well. He did have high output from JP drain on POD1 as expected given his history of cirrhosis and ascites.  Advancement of patient's diet and ambulation were well-tolerated. The remainder of patient's hospital course was essentially unremarkable, and discharge planning was initiated accordingly with patient safely able to be discharged home with appropriate discharge instructions, pain control, and outpatient  follow-up after all of his questions were answered to his expressed satisfaction.   Discharge Condition: Good   Physical Examination:  Constitutional: Well appearing male, NAD HEENT: EOMI, PERRLA, No icterus Pulmonary: Normal effort, no respiratory distress Gastrointestinal: Soft, incisional soreness, non-distended, no rebound/guarding. JP in the LLQ with serous output Skin: Umbilical incision is CDI with dermabond, no erythema or drainage    Allergies as of 01/19/2021      Reactions   Chantix [varenicline]    Lexapro [escitalopram]          Medication List    TAKE these medications   furosemide 20 MG tablet Commonly known as: LASIX Take 1 tablet (20 mg total) by mouth 2 (two) times daily. What changed: when to take this   Oxycodone HCl 10 MG Tabs Take 1 tablet (10 mg total) by mouth every 6 (six) hours as needed for severe pain or breakthrough pain.   pantoprazole 40 MG tablet Commonly known as: PROTONIX Take 1 tablet (40 mg total) by mouth daily for 15 doses. What changed: when to take this   spironolactone 25 MG tablet Commonly known as: ALDACTONE  Take 2 tablets (50 mg total) by mouth 2 (two) times daily. What changed: when to take this         Follow-up Information    Adron Bene. Go in 1 week(s).   Specialty: Gastroenterology Why: remember to go to your gastroeneterology appointment next week for Cirrhosis       [place], [place] F, MD.  Schedule an appointment as soon as possible for a visit on 01/29/2021.   Specialty: General Surgery Why: s/p incarcerated incisional hernia repair, has drain....okay to double book per [employer] information: [address] [place] [address] [phone]                Time spent on discharge management including discussion of hospital course, clinical condition, outpatient instructions, prescriptions, and follow up with the patient and members of the medical team: >30 minutes  -- Edison Simon , PA-C [facility]  01/19/2021, 9:01 AM [phone] M-F: 7am - 4pm

## 2021-01-18 NOTE — Progress Notes (Signed)
   01/18/21 0625  Clinical Encounter Type  Visited With Patient  Visit Type Initial;Spiritual support  Referral From Nurse  Consult/Referral To Chaplain  Spiritual Encounters  Spiritual Needs Prayer  Chaplain Maryuri Warnke received an OR at 10:36 pm. I attempted to visited Pt twice both times Pt resting or asleep. On call Chaplain with follow up to day to provide prayer and spiritual care for PT.

## 2021-01-18 NOTE — Progress Notes (Signed)
   01/18/21 0750  Clinical Encounter Type  Visited With Patient  Visit Type Follow-up  Referral From Nurse  Consult/Referral To Chaplain  Spiritual Encounters  Spiritual Needs Prayer;Emotional  Chaplain Jalayia Bagheri visited with Mr. Omar Turner in 219. Pt have been diagnosis with cirrhosis of the liver and some other medical issues. Pt was teary eye while telling of his conditions. I used reflective listening and words of encouragement and ended the visit with a prayer of comfort.

## 2021-01-18 NOTE — Progress Notes (Addendum)
Dressing changed to JP drain insertion site. Secure chat to Loel Dubonnet about amount of drainage.   [place] chat to MD regarding BP 91/66  1305- Dr. Dahlia Byes at bedside at this time.

## 2021-01-19 LAB — SURGICAL PATHOLOGY

## 2021-01-19 MED ORDER — SPIRONOLACTONE 25 MG PO TABS: 50.0000 mg | ORAL_TABLET | Freq: Two times a day (BID) | ORAL | 1 refills | Status: DC

## 2021-01-19 MED ORDER — OXYCODONE HCL 10 MG PO TABS: 10.0000 mg | ORAL_TABLET | Freq: Four times a day (QID) | ORAL | 0 refills | Status: DC | PRN

## 2021-01-19 MED ORDER — FUROSEMIDE 20 MG PO TABS: 20.0000 mg | ORAL_TABLET | Freq: Two times a day (BID) | ORAL | 1 refills | Status: DC

## 2021-01-19 NOTE — Discharge Instructions (Signed)
In addition to included general post-operative instructions,  Diet: Resume home diet.   Activity: No heavy lifting >20 pounds (children, pets, laundry, garbage) for 6 weeks, but light activity and walking are encouraged. Do not drive or drink alcohol if taking narcotic pain medications or having pain that might distract from driving.  Wound care: If you can keep drain site covered, You may shower/get incision wet with soapy water and pat dry (do not rub incisions), but no baths or submerging incision underwater until follow-up.   Surgical Drain: Remember that for the first few days your drain will have high output and may leak around the tubing. This is expected given your degree of cirrhosis and ascites at baseline. We have made adjustments to your home medications to help with this. Monitor and record the output daily, I provided you a sheet for this.   Home Medications: We made adjustments to your home Lasix and Spironolactone   Pain Medications: For mild to moderate pain: ibuprofen/naproxen (if no kidney disease). Avoid tylenol given liver disease. Narcotic pain medications, if prescribed, can be used for severe pain, though may cause nausea, constipation, and drowsiness. Do not combine Tylenol and Percocet (or similar) within a 6 hour period as Percocet (and similar) contain(s) Tylenol. If you do not need the narcotic pain medication, you do not need to fill the prescription.  Call office [phone] / [phone]) at any time if any questions, worsening pain, fevers/chills, bleeding, drainage from incision site, or other concerns.

## 2021-01-19 NOTE — Plan of Care (Signed)

## 2021-01-20 LAB — TYPE AND SCREEN
ABO/RH(D): O NEG
Antibody Screen: NEGATIVE
Unit division: 0
Unit division: 0

## 2021-01-20 LAB — BPAM RBC
Blood Product Expiration Date: 202205092359
Blood Product Expiration Date: 202205092359
Unit Type and Rh: 5100
Unit Type and Rh: 5100

## 2021-01-20 LAB — PREPARE RBC (CROSSMATCH)

## 2021-01-24 ENCOUNTER — Other Ambulatory Visit: Payer: Self-pay | Admitting: Physician Assistant

## 2021-01-24 DIAGNOSIS — K7031 Alcoholic cirrhosis of liver with ascites: Secondary | ICD-10-CM

## 2021-01-29 ENCOUNTER — Other Ambulatory Visit: Payer: Self-pay

## 2021-01-29 ENCOUNTER — Ambulatory Visit (INDEPENDENT_AMBULATORY_CARE_PROVIDER_SITE_OTHER): Payer: BC Managed Care – PPO | Admitting: Surgery

## 2021-01-29 ENCOUNTER — Encounter: Payer: Self-pay | Admitting: Surgery

## 2021-01-29 VITALS — BP 121/84 | HR 106 | Temp 98.4°F | Ht 71.0 in | Wt 191.4 lb

## 2021-01-29 DIAGNOSIS — K7031 Alcoholic cirrhosis of liver with ascites: Secondary | ICD-10-CM | POA: Diagnosis not present

## 2021-01-29 NOTE — Patient Instructions (Addendum)
We have placed a colostomy bag over the drain hole so the liquid can drain into the bag. The hole will close within a few days. Once this happens you may remove the bag.   No heavy lifting grater than 15-20 pounds for 6 weeks from the date of your surgery.  Please see your follow up appointment listed below.   Consult appointment scheduled 02/01/21 @ 1:45 [facility]. If you cannot make this appointment call 24 hours in advance,otherwise there is a  [place]   We have provided you with Complimentary Colostomy supplies from our office today.  Please see your return appointment listed below.

## 2021-01-29 NOTE — Progress Notes (Signed)
Surgical Consultation  01/29/2021  Omar Turner is an 50 y.o. male.   Chief Complaint  Patient presents with  . Routine Post Op    Ventral hernia repair 4/13     HPI: Omar Turner is a 50 year old male with a history of alcoholic cirrhosis with ascites Omar Turner presented to the emergency room 12 days ago with an incarcerated ventral hernia and bowel obstruction.  He was taken emergently to the operating room for a ventral hernia repair with biological mesh.  A drain was placed.  Intraoperatively 2 L of ascites were drained.  He was kept in the hospital for 2 days.  Since then he was placed on spironolactone and Lasix.  He saw GI from [facility] clinic and they have increased the dose of both Lasix and the spironolactone.  Currently he is on Lasix 40 mg twice daily and spironolactone 50 mg p.o. twice daily. He does have some ascites leaking around the drain site.  He is averaging about 1 to 2 ounces a day from the Omar Turner.  He does have some intermittent pains that are expected postsurgically.  There is no evidence of fevers chills.  He quit drinking over a month ago.  He has tolerated the spironolactone and the Lasix well.  Recent labs personally reviewed showing evidence of preserved kidney function and stable bilirubin.  No evidence of acute fulminant liver failure  Past Medical History:  Diagnosis Date  . Anginal pain ([place])   . Anxiety   . Degenerative disc disease, cervical   . Diverticulitis large intestine   . GERD (gastroesophageal reflux disease)   . History of chest pain   . Hyperlipidemia   . Hypertension   . Sleep apnea   . Tobacco abuse     Past Surgical History:  Procedure Laterality Date  . APPENDECTOMY    . CARDIAC CATHETERIZATION Left 06/17/2016   Procedure: Left Heart Cath and Coronary Angiography;  Surgeon: Wellington Hampshire, MD;  Location: [place] [facility];  Service: Cardiovascular;  Laterality: Left;  . COLONOSCOPY    . COLONOSCOPY WITH PROPOFOL N/A 09/07/2015    Procedure: COLONOSCOPY WITH PROPOFOL;  Surgeon: Hulen Luster, MD;  Location: [facility] ENDOSCOPY;  Service: Gastroenterology;  Laterality: N/A;  . COLONOSCOPY WITH PROPOFOL N/A 05/12/2018   Procedure: COLONOSCOPY WITH PROPOFOL;  Surgeon: Jonathon Bellows, MD;  Location: [facility] ENDOSCOPY;  Service: Gastroenterology;  Laterality: N/A;  . HERNIA REPAIR    . VENTRAL HERNIA REPAIR N/A 01/17/2021   Procedure: HERNIA REPAIR VENTRAL ADULT;  Surgeon: Jules Husbands, MD;  Location: [facility] ORS;  Service: General;  Laterality: N/A;    Family History  Problem Relation Age of Onset  . Hypertension Father   . Heart disease Father        CABG x 3 & valve replacement.   [name] Heart attack Father     Social History:  reports that he has been smoking. He has a 22.50 pack-year smoking history. He has never used smokeless tobacco. He reports current alcohol use of about 2.0 standard drinks of alcohol per week. He reports that he does not use drugs.  Allergies:  Allergies  Allergen Reactions  . Chantix [Varenicline]   . Lexapro [Escitalopram]          Medications reviewed.     ROS Full ROS performed and is otherwise negative other than what is stated in the HPI    BP 121/84   Pulse (!) 106   Temp 98.4 F (36.9 C) (  Oral)   Ht 5\' 11"  (1.803 m)   Wt 191 lb 6.4 oz (86.8 kg)   SpO2 97%   BMI 26.69 kg/m   Physical Exam Vitals and nursing note reviewed. Exam conducted with a chaperone present.  Constitutional:      General: He is not in acute distress.    Appearance: Normal appearance. He is normal weight.  Eyes:     General: No scleral icterus.       Right eye: No discharge.        Left eye: No discharge.  Cardiovascular:     Rate and Rhythm: Normal rate and regular rhythm.  Pulmonary:     Effort: Pulmonary effort is normal. No respiratory distress.     Breath sounds: Normal breath sounds. No stridor.  Abdominal:     General: Abdomen is flat. There is no distension.     Palpations: Abdomen is soft.      Tenderness: There is no abdominal tenderness.     Hernia: No hernia is present.     Comments: Ventral hernia incision healing well without evidence of infection.  There is no evidence of leaking ascites around the incision.  The JP drain on the left lower quadrant was removed.  I replaced it with a colostomy appliance.  There is no evidence of peritonitis.  The ascites is clear  Musculoskeletal:     Cervical back: Normal range of motion and neck supple. No rigidity.  Skin:    General: Skin is warm and dry.     Capillary Refill: Capillary refill takes less than 2 seconds.  Neurological:     General: No focal deficit present.     Mental Status: He is alert.  Psychiatric:        Mood and Affect: Mood normal.        Behavior: Behavior normal.        Thought Content: Thought content normal.        Judgment: Judgment normal.       Assessment/Plan: Alcoholic cirrhosis with ascites Omar Turner status post recent ventral hernia repair with biological mesh.  These are always very challenging situations.  I agreed with current dose of spironolactone and Lasix and would not change any medical management.  I have moved it JP drain and have placed a colostomy appliance / bag .  We will also make sure that he follows up with interventional radiology for potential portosystemic shunt and TIPS procedure.  He does have an upcoming appointment with transplant hepatology at [facility] but does not have a final date.  I will see him back in a month or 2. I have spent at least 40 minutes in this encounter including coordinating his care, providing counseling and reviewing medical records.  Please note that this extra work was not related to his immediate surgery but rather to his chronic cirrhosis that I am helping to manage   Caroleen Hamman, MD [facility] Surgeon

## 2021-01-31 ENCOUNTER — Encounter: Payer: BC Managed Care – PPO | Admitting: Surgery

## 2021-02-01 ENCOUNTER — Ambulatory Visit
Admission: RE | Admit: 2021-02-01 | Discharge: 2021-02-01 | Disposition: A | Discharge status: Home / Self Care | Payer: BC Managed Care – PPO | Source: Ambulatory Visit | Attending: Physician Assistant | Admitting: Physician Assistant

## 2021-02-01 DIAGNOSIS — K7031 Alcoholic cirrhosis of liver with ascites: Secondary | ICD-10-CM

## 2021-02-01 HISTORY — PX: IR RADIOLOGIST EVAL & MGMT: IMG5224

## 2021-02-01 NOTE — Consult Note (Signed)
Chief Complaint:  Cirrhosis and ascites  Referring Physician(s): Schulz,Zachary R  History of Present Illness: Omar Turner is a 50 y.o. male with known alcoholic cirrhosis, Childs B, and some intermittent ascites development.  No prior history of variceal bleed.  Recently, he presented to the emergency room approximate 2 weeks ago with an incarcerated ventral hernia containing a loop of obstructed small bowel.  This required emergent operation for repair and reduction.  Abdominal surgical drain was placed following the hernia repair.  Reportedly in the OR 2 L of ascites was also drained.  He was discharged and placed on spironolactone and Lasix for diuresis with GI follow-up.  His left lower quadrant drain site had some leakage around it at the skin.  This was recently pulled and the skin hole was covered with a colostomy bag.  He is only had 1 paracentesis besides the surgical drainage of ascites.  He is planning to see the transplant service at [place].  Recent lab values confirm a calculated sodium meld score of 9.0.  Total bilirubin 0.6.  Creatinine 0.8.  Sodium 133.  INR 1.3.  Past Medical History:  Diagnosis Date  . Anginal pain ([place])   . Anxiety   . Degenerative disc disease, cervical   . Diverticulitis large intestine   . GERD (gastroesophageal reflux disease)   . History of chest pain   . Hyperlipidemia   . Hypertension   . Sleep apnea   . Tobacco abuse     Past Surgical History:  Procedure Laterality Date  . APPENDECTOMY    . CARDIAC CATHETERIZATION Left 06/17/2016   Procedure: Left Heart Cath and Coronary Angiography;  Surgeon: Wellington Hampshire, MD;  Location: [place] [facility];  Service: Cardiovascular;  Laterality: Left;  . COLONOSCOPY    . COLONOSCOPY WITH PROPOFOL N/A 09/07/2015   Procedure: COLONOSCOPY WITH PROPOFOL;  Surgeon: Hulen Luster, MD;  Location: [facility] ENDOSCOPY;  Service: Gastroenterology;  Laterality: N/A;  . COLONOSCOPY WITH  PROPOFOL N/A 05/12/2018   Procedure: COLONOSCOPY WITH PROPOFOL;  Surgeon: Jonathon Bellows, MD;  Location: [facility] ENDOSCOPY;  Service: Gastroenterology;  Laterality: N/A;  . HERNIA REPAIR    . VENTRAL HERNIA REPAIR N/A 01/17/2021   Procedure: HERNIA REPAIR VENTRAL ADULT;  Surgeon: Jules Husbands, MD;  Location: [facility] ORS;  Service: General;  Laterality: N/A;    Allergies: Chantix [varenicline] and Lexapro [escitalopram]  Medications: Prior to Admission medications   Medication Sig Start Date End Date Taking? Authorizing Provider  furosemide (LASIX) 20 MG tablet Take 1 tablet (20 mg total) by mouth 2 (two) times daily. 01/19/21   Tylene Fantasia, PA-C  oxyCODONE 10 MG TABS Take 1 tablet (10 mg total) by mouth every 6 (six) hours as needed for severe pain or breakthrough pain. 01/19/21   Tylene Fantasia, PA-C  pantoprazole (PROTONIX) 40 MG tablet Take 1 tablet (40 mg total) by mouth daily for 15 doses. Patient taking differently: Take 40 mg by mouth 2 (two) times daily. 12/08/20 12/23/20  Jan Fireman, PA-C  spironolactone (ALDACTONE) 25 MG tablet Take 2 tablets (50 mg total) by mouth 2 (two) times daily. 01/19/21   Tylene Fantasia, PA-C     Family History  Problem Relation Age of Onset  . Hypertension Father   . Heart disease Father        CABG x 3 & valve replacement.   [name] Heart attack Father     Social History   Socioeconomic History  .  Marital status: Single    Spouse name: Not on file  . Number of children: Not on file  . Years of education: Not on file  . Highest education level: Not on file  Occupational History  . Not on file  Tobacco Use  . Smoking status: Current Every Day Smoker    Packs/day: 1.50    Years: 15.00    Pack years: 22.50  . Smokeless tobacco: Never Used  Vaping Use  . Vaping Use: Never used  Substance and Sexual Activity  . Alcohol use: Yes    Alcohol/week: 2.0 standard drinks    Types: 2 Shots of liquor per week    Comment: per day  . Drug use: No  .  Sexual activity: Not on file  Other Topics Concern  . Not on file  Social History Narrative  . Not on file   Social Determinants of Health   Financial Resource Strain: Not on file  Food Insecurity: Not on file  Transportation Needs: Not on file  Physical Activity: Not on file  Stress: Not on file  Social Connections: Not on file     Review of Systems: A 12 point ROS discussed and pertinent positives are indicated in the HPI above.  All other systems are negative.  Review of Systems  Vital Signs: BP 111/80 (BP Location: Right Arm)   Pulse 96   SpO2 100%   Physical Exam Constitutional:      General: He is not in acute distress. Eyes:     General: No scleral icterus.    Conjunctiva/sclera: Conjunctivae normal.  Abdominal:     General: There is no distension.     Palpations: Abdomen is soft.     Tenderness: There is no abdominal tenderness.     Comments: Periumbilical incision site is clean, dry and intact.  Left lower quadrant drain site is covered with a colostomy bag.  Through the bag the site does not demonstrate any leaking fluid.  The drain bag is dry.     Imaging: CT Abdomen Pelvis W Contrast  Result Date: 01/17/2021 CLINICAL DATA:  Abdominal pain, hernia suspected Right-sided abdominal pain.  Known hernia.  Liver cirrhosis. EXAM: CT ABDOMEN AND PELVIS WITH CONTRAST TECHNIQUE: Multidetector CT imaging of the abdomen and pelvis was performed using the standard protocol following bolus administration of intravenous contrast. CONTRAST:  1102mL OMNIPAQUE IOHEXOL 300 MG/ML  SOLN COMPARISON:  Most recent CT 10/28/2019 FINDINGS: Lower chest: Small left pleural effusion. Left greater than right basilar atelectasis. Heart is normal in size. Hepatobiliary: Diffusely decreased hepatic density with nodular hepatic contours consistent with cirrhosis. No evidence of focal lesion. Gallbladder physiologically distended, no calcified stone. No biliary dilatation. No portal vein  thrombosis. Pancreas: No ductal dilatation or inflammation. Spleen: Enlarged spanning 14 x 7.6 x 15.3 cm (volume = 850 cm^3). Punctate calcified granuloma. Splenules at the hilum. Adrenals/Urinary Tract: Normal adrenal glands. No hydronephrosis or perinephric edema. Homogeneous renal enhancement with symmetric excretion on delayed phase imaging. Urinary bladder is nondistended without wall thickening. Stomach/Bowel: Umbilical hernia contains ascites. Suspect short-segment fluid-filled small bowel within the hernia as well. Small bowel loops immediately adjacent are fluid-filled and prominent in size, 2.6 cm maximal dimension. Distal small bowel is decompressed. The stomach is decompressed. Appendectomy. Left colonic diverticulosis without diverticulitis. Vascular/Lymphatic: Moderate aortic atherosclerosis, age advanced. No aortic aneurysm. Patent portal and splenic veins. Mesenteric vessels are patent. Few prominent portal caval nodes are likely reactive, largest 8 mm. Reproductive: Prostate is unremarkable. Other: Small to  moderate volume abdominopelvic ascites. Ascites tracks into umbilical hernia. There is generalized edema of the mesenteric fat. No free air or abscess. Musculoskeletal: Bone island in the left intertrochanteric femur. Remote left anterolateral rib fractures. There are no acute or suspicious osseous abnormalities. IMPRESSION: 1. Umbilical hernia contains ascites. Suspect short-segment fluid-filled small bowel within the hernia. The immediately proximal small bowel loops are fluid-filled and upper normal in size, suspicious for early small bowel obstruction. 2. Cirrhosis with portal hypertension, splenomegaly, and small to moderate volume abdominopelvic ascites. 3. Small left pleural effusion. 4. Left colonic diverticulosis without diverticulitis. 5. Age advanced aortic atherosclerosis. Aortic Atherosclerosis (ICD10-I70.0). Electronically Signed   By: Keith Rake M.D.   On: 01/17/2021 17:05     Labs:  CBC: Recent Labs    01/17/21 1456 01/18/21 0640  WBC 9.9 9.1  HGB 8.7* 8.0*  HCT 26.8* 24.7*  PLT 375 278    COAGS: Recent Labs    01/17/21 1641  INR 1.2    BMP: Recent Labs    01/17/21 1456 01/18/21 0640  NA 136 135  K 3.8 4.0  CL 105 103  CO2 20* 23  GLUCOSE 125* 134*  BUN 12 10  CALCIUM 9.1 8.7*  CREATININE 0.73 0.63  GFRNONAA >60 >60    LIVER FUNCTION TESTS: Recent Labs    01/17/21 1456 01/18/21 0640  BILITOT 0.6 0.8  AST 38 26  ALT 17 13  ALKPHOS 101 74  PROT 7.9 7.3  ALBUMIN 3.7 3.6     Assessment and Plan:  Alcoholic cirrhosis and early ascites.  Meld score 9.0.  Recent surgical repair of a incarcerated ventral hernia.  Leakage of ascites from a left lower quadrant JP surgical drain.  This was seen recently and removed by surgery.  Colostomy bag placed over the draining left lower quadrant abdominal wall tract.  Patient reports no more leakage from the drain site and no fluid has collected within the colostomy bag indicating that the drain site is slowly closing.  At this point, he has early alcoholic cirrhosis without significant recurrent ascites.  No episodes of esophageal varices or variceal bleeding.  Currently TIPS would not be indicated.  The TIPS procedure was reviewed in detail with the patient and his mother who accompanied him today.  All questions were addressed.  Certainly in the future if his liver cirrhosis progresses he may need a TIPS in the future.    Thank you for this interesting consult.  I greatly enjoyed meeting Omar Turner and look forward to participating in their care.  A copy of this report was sent to the requesting provider on this date.  Electronically Signed: Greggory Keen 02/01/2021, 2:38 PM   I spent a total of  40 Minutes   in face to face in clinical consultation, greater than 50% of which was counseling/coordinating care for this patient with early cirrhosis

## 2021-02-02 ENCOUNTER — Encounter: Payer: Self-pay | Admitting: *Deleted

## 2021-02-20 ENCOUNTER — Other Ambulatory Visit: Payer: Self-pay | Admitting: Internal Medicine

## 2021-02-20 ENCOUNTER — Other Ambulatory Visit (HOSPITAL_COMMUNITY): Payer: Self-pay | Admitting: Internal Medicine

## 2021-02-20 DIAGNOSIS — K7031 Alcoholic cirrhosis of liver with ascites: Secondary | ICD-10-CM

## 2021-02-23 ENCOUNTER — Other Ambulatory Visit: Payer: Self-pay

## 2021-02-23 ENCOUNTER — Ambulatory Visit (HOSPITAL_COMMUNITY)
Admission: RE | Admit: 2021-02-23 | Discharge: 2021-02-23 | Disposition: A | Discharge status: Home / Self Care | Payer: BC Managed Care – PPO | Source: Ambulatory Visit | Attending: Internal Medicine | Admitting: Internal Medicine

## 2021-02-23 DIAGNOSIS — K7031 Alcoholic cirrhosis of liver with ascites: Secondary | ICD-10-CM | POA: Insufficient documentation

## 2021-02-23 DIAGNOSIS — D099 Carcinoma in situ, unspecified: Secondary | ICD-10-CM

## 2021-02-23 HISTORY — DX: Carcinoma in situ, unspecified: D09.9

## 2021-02-26 ENCOUNTER — Telehealth: Payer: Self-pay | Admitting: *Deleted

## 2021-02-26 NOTE — Telephone Encounter (Signed)
Faxed FMLA to [place] [phone]

## 2021-03-07 ENCOUNTER — Encounter: Payer: Self-pay | Admitting: Surgery

## 2021-05-29 DIAGNOSIS — C4491 Basal cell carcinoma of skin, unspecified: Secondary | ICD-10-CM

## 2021-05-29 HISTORY — DX: Basal cell carcinoma of skin, unspecified: C44.91

## 2021-10-25 ENCOUNTER — Ambulatory Visit: Payer: BC Managed Care – PPO | Admitting: Physician Assistant

## 2021-10-25 ENCOUNTER — Encounter: Payer: Self-pay | Admitting: Physician Assistant

## 2021-10-25 VITALS — BP 146/89 | HR 110 | Temp 99.1°F | Resp 18 | Ht 71.0 in | Wt 220.0 lb

## 2021-10-25 DIAGNOSIS — U071 COVID-19: Secondary | ICD-10-CM | POA: Diagnosis not present

## 2021-10-25 DIAGNOSIS — J069 Acute upper respiratory infection, unspecified: Secondary | ICD-10-CM | POA: Diagnosis not present

## 2021-10-25 NOTE — Progress Notes (Signed)
° °  Subjective:    Patient ID: Omar Turner, male    DOB: 1971/04/20, 51 y.o.   MRN: [id number]  URI  This is a new problem. The current episode started in the past 7 days. The problem has been gradually worsening. The maximum temperature recorded prior to his arrival was 100.4 - 100.9 F. The fever has been present for 1 to 2 days. Associated symptoms include congestion, coughing, headaches, nausea and vomiting. Pertinent negatives include no abdominal pain, chest pain, diarrhea, dysuria, ear pain, joint swelling, neck pain, rash, sore throat, swollen glands or wheezing. He has tried acetaminophen, decongestant and increased fluids for the symptoms. The treatment provided mild relief.     Review of Systems  Constitutional:  Positive for appetite change, chills, fatigue and fever.  HENT:  Positive for congestion and sinus pressure. Negative for ear pain and sore throat.   Respiratory:  Positive for cough. Negative for wheezing.   Cardiovascular:  Negative for chest pain and leg swelling.  Gastrointestinal:  Positive for nausea and vomiting. Negative for abdominal pain and diarrhea.  Genitourinary:  Negative for dysuria.  Musculoskeletal:  Positive for arthralgias. Negative for neck pain.  Skin:  Negative for rash.  Neurological:  Positive for headaches.      Objective:   Physical Exam HENT:     Right Ear: Tympanic membrane normal.     Left Ear: Tympanic membrane normal.     Nose: Congestion present.     Mouth/Throat:     Pharynx: No oropharyngeal exudate.  Eyes:     Extraocular Movements: Extraocular movements intact.     Pupils: Pupils are equal, round, and reactive to light.  Cardiovascular:     Rate and Rhythm: Tachycardia present.     Pulses: Normal pulses.     Heart sounds: No murmur heard.   No friction rub. No gallop.  Pulmonary:     Effort: Pulmonary effort is normal.     Breath sounds: Normal breath sounds. No wheezing.  Abdominal:     Palpations: Abdomen is soft.   Musculoskeletal:        General: Normal range of motion.     Cervical back: No rigidity.  Lymphadenopathy:     Cervical: No cervical adenopathy.  Skin:    General: Skin is warm and dry.     Findings: No rash.  Neurological:     Mental Status: He is alert and oriented to person, place, and time.          Assessment & Plan:   Assessment: 1. Upper Respiratory Infection  2. COVID 19  Plan: I discussed the vital signs with the patient. I ask him to increase fluids. To use Tylenol extra strength four times daily for fever and/or aching.  He is given prescriptions for Zofran ODT 4mg  for nausea, Promethazine DM for cough, and Allegra D every 12 hours for congestion He was advised to use his mask and wash hand frequently. He is excused from work duty 1/19-1/24. He will return to the clinic if not improving.[name]

## 2021-10-25 NOTE — Patient Instructions (Signed)
I discussed the vital signs with the patient. I ask him to increase fluids. To use Tylenol extra strength four times daily for fever and/or aching.  He is given prescriptions for Zofran ODT 4mg  for nausea, Promethazine DM for cough, and Allegra D every 12 hours for congestion He was advised to use his mask and wash hand frequently. He is excused from work duty 1/19-1/24. He will return to the clinic if not improving.[name]

## 2021-11-15 ENCOUNTER — Observation Stay
Admission: EM | Admit: 2021-11-15 | Discharge: 2021-11-17 | Disposition: A | Discharge status: Home / Self Care | Payer: BC Managed Care – PPO | Attending: Student in an Organized Health Care Education/Training Program | Admitting: Student in an Organized Health Care Education/Training Program

## 2021-11-15 ENCOUNTER — Encounter: Payer: Self-pay | Admitting: Emergency Medicine

## 2021-11-15 ENCOUNTER — Emergency Department: Payer: BC Managed Care – PPO

## 2021-11-15 ENCOUNTER — Other Ambulatory Visit: Payer: Self-pay

## 2021-11-15 DIAGNOSIS — K2970 Gastritis, unspecified, without bleeding: Secondary | ICD-10-CM | POA: Insufficient documentation

## 2021-11-15 DIAGNOSIS — K2971 Gastritis, unspecified, with bleeding: Secondary | ICD-10-CM | POA: Diagnosis not present

## 2021-11-15 DIAGNOSIS — K219 Gastro-esophageal reflux disease without esophagitis: Secondary | ICD-10-CM | POA: Diagnosis present

## 2021-11-15 DIAGNOSIS — Z720 Tobacco use: Secondary | ICD-10-CM | POA: Diagnosis present

## 2021-11-15 DIAGNOSIS — K921 Melena: Secondary | ICD-10-CM | POA: Diagnosis present

## 2021-11-15 DIAGNOSIS — E878 Other disorders of electrolyte and fluid balance, not elsewhere classified: Secondary | ICD-10-CM | POA: Diagnosis present

## 2021-11-15 DIAGNOSIS — K920 Hematemesis: Secondary | ICD-10-CM | POA: Diagnosis present

## 2021-11-15 DIAGNOSIS — F411 Generalized anxiety disorder: Secondary | ICD-10-CM | POA: Diagnosis present

## 2021-11-15 DIAGNOSIS — Z6830 Body mass index (BMI) 30.0-30.9, adult: Secondary | ICD-10-CM

## 2021-11-15 DIAGNOSIS — Y908 Blood alcohol level of 240 mg/100 ml or more: Secondary | ICD-10-CM | POA: Insufficient documentation

## 2021-11-15 DIAGNOSIS — E876 Hypokalemia: Secondary | ICD-10-CM | POA: Diagnosis present

## 2021-11-15 DIAGNOSIS — D6489 Other specified anemias: Secondary | ICD-10-CM | POA: Diagnosis present

## 2021-11-15 DIAGNOSIS — Z79899 Other long term (current) drug therapy: Secondary | ICD-10-CM | POA: Insufficient documentation

## 2021-11-15 DIAGNOSIS — I10 Essential (primary) hypertension: Secondary | ICD-10-CM | POA: Diagnosis not present

## 2021-11-15 DIAGNOSIS — Z20822 Contact with and (suspected) exposure to covid-19: Secondary | ICD-10-CM | POA: Diagnosis present

## 2021-11-15 DIAGNOSIS — K2100 Gastro-esophageal reflux disease with esophagitis, without bleeding: Principal | ICD-10-CM | POA: Insufficient documentation

## 2021-11-15 DIAGNOSIS — K7031 Alcoholic cirrhosis of liver with ascites: Secondary | ICD-10-CM | POA: Diagnosis not present

## 2021-11-15 DIAGNOSIS — K3189 Other diseases of stomach and duodenum: Secondary | ICD-10-CM | POA: Diagnosis present

## 2021-11-15 DIAGNOSIS — F1020 Alcohol dependence, uncomplicated: Secondary | ICD-10-CM | POA: Diagnosis not present

## 2021-11-15 DIAGNOSIS — G4730 Sleep apnea, unspecified: Secondary | ICD-10-CM | POA: Diagnosis present

## 2021-11-15 DIAGNOSIS — F17210 Nicotine dependence, cigarettes, uncomplicated: Secondary | ICD-10-CM | POA: Diagnosis not present

## 2021-11-15 DIAGNOSIS — F1010 Alcohol abuse, uncomplicated: Secondary | ICD-10-CM

## 2021-11-15 DIAGNOSIS — K922 Gastrointestinal hemorrhage, unspecified: Secondary | ICD-10-CM | POA: Diagnosis present

## 2021-11-15 DIAGNOSIS — R55 Syncope and collapse: Secondary | ICD-10-CM | POA: Diagnosis present

## 2021-11-15 DIAGNOSIS — R7989 Other specified abnormal findings of blood chemistry: Secondary | ICD-10-CM | POA: Diagnosis present

## 2021-11-15 DIAGNOSIS — K7011 Alcoholic hepatitis with ascites: Secondary | ICD-10-CM | POA: Diagnosis present

## 2021-11-15 DIAGNOSIS — R112 Nausea with vomiting, unspecified: Secondary | ICD-10-CM | POA: Diagnosis present

## 2021-11-15 DIAGNOSIS — E860 Dehydration: Secondary | ICD-10-CM | POA: Diagnosis present

## 2021-11-15 DIAGNOSIS — E871 Hypo-osmolality and hyponatremia: Secondary | ICD-10-CM | POA: Diagnosis present

## 2021-11-15 DIAGNOSIS — U071 COVID-19: Secondary | ICD-10-CM | POA: Diagnosis present

## 2021-11-15 DIAGNOSIS — E7849 Other hyperlipidemia: Secondary | ICD-10-CM | POA: Diagnosis present

## 2021-11-15 DIAGNOSIS — E669 Obesity, unspecified: Secondary | ICD-10-CM | POA: Diagnosis present

## 2021-11-15 DIAGNOSIS — F10139 Alcohol abuse with withdrawal, unspecified: Secondary | ICD-10-CM | POA: Diagnosis present

## 2021-11-15 DIAGNOSIS — Z8249 Family history of ischemic heart disease and other diseases of the circulatory system: Secondary | ICD-10-CM

## 2021-11-15 DIAGNOSIS — K766 Portal hypertension: Secondary | ICD-10-CM | POA: Diagnosis not present

## 2021-11-15 DIAGNOSIS — R0602 Shortness of breath: Secondary | ICD-10-CM | POA: Diagnosis present

## 2021-11-15 DIAGNOSIS — Z888 Allergy status to other drugs, medicaments and biological substances status: Secondary | ICD-10-CM

## 2021-11-15 LAB — URINALYSIS, ROUTINE W REFLEX MICROSCOPIC
Bilirubin Urine: NEGATIVE
Glucose, UA: NEGATIVE mg/dL
Hgb urine dipstick: NEGATIVE
Ketones, ur: NEGATIVE mg/dL
Leukocytes,Ua: NEGATIVE
Nitrite: NEGATIVE
Protein, ur: NEGATIVE mg/dL
Specific Gravity, Urine: 1.008 (ref 1.005–1.030)
pH: 6 (ref 5.0–8.0)

## 2021-11-15 LAB — COMPREHENSIVE METABOLIC PANEL
ALT: 194 U/L — ABNORMAL HIGH (ref 0–44)
AST: 254 U/L — ABNORMAL HIGH (ref 15–41)
Albumin: 4 g/dL (ref 3.5–5.0)
Alkaline Phosphatase: 116 U/L (ref 38–126)
Anion gap: 14 (ref 5–15)
BUN: 8 mg/dL (ref 6–20)
CO2: 24 mmol/L (ref 22–32)
Calcium: 8.4 mg/dL — ABNORMAL LOW (ref 8.9–10.3)
Chloride: 92 mmol/L — ABNORMAL LOW (ref 98–111)
Creatinine, Ser: 0.76 mg/dL (ref 0.61–1.24)
GFR, Estimated: >60 mL/min (ref >60)
Glucose, Bld: 142 mg/dL — ABNORMAL HIGH (ref 70–99)
Potassium: 3.1 mmol/L — ABNORMAL LOW (ref 3.5–5.1)
Sodium: 130 mmol/L — ABNORMAL LOW (ref 135–145)
Total Bilirubin: 1.4 mg/dL — ABNORMAL HIGH (ref 0.3–1.2)
Total Protein: 8.1 g/dL (ref 6.5–8.1)

## 2021-11-15 LAB — URINE DRUG SCREEN, QUALITATIVE (ARMC ONLY)
Amphetamines, Ur Screen: NOT DETECTED
Barbiturates, Ur Screen: NOT DETECTED
Benzodiazepine, Ur Scrn: NOT DETECTED
Cannabinoid 50 Ng, Ur ~~LOC~~: NOT DETECTED
Cocaine Metabolite,Ur ~~LOC~~: NOT DETECTED
MDMA (Ecstasy)Ur Screen: NOT DETECTED
Methadone Scn, Ur: NOT DETECTED
Opiate, Ur Screen: NOT DETECTED
Phencyclidine (PCP) Ur S: NOT DETECTED
Tricyclic, Ur Screen: NOT DETECTED

## 2021-11-15 LAB — CBC
HCT: 43.8 % (ref 39.0–52.0)
Hemoglobin: 15.8 g/dL (ref 13.0–17.0)
MCH: 34.4 pg — ABNORMAL HIGH (ref 26.0–34.0)
MCHC: 36.1 g/dL — ABNORMAL HIGH (ref 30.0–36.0)
MCV: 95.4 fL (ref 80.0–100.0)
Platelets: 141 10*3/uL — ABNORMAL LOW (ref 150–400)
RBC: 4.59 MIL/uL (ref 4.22–5.81)
RDW: 14 % (ref 11.5–15.5)
WBC: 11 10*3/uL — ABNORMAL HIGH (ref 4.0–10.5)
nRBC: 0 % (ref 0.0–0.2)

## 2021-11-15 LAB — LIPASE, BLOOD: Lipase: 48 U/L (ref 11–51)

## 2021-11-15 LAB — PROTIME-INR
INR: 1.1 (ref 0.8–1.2)
Prothrombin Time: 13.8 seconds (ref 11.4–15.2)

## 2021-11-15 LAB — TYPE AND SCREEN
ABO/RH(D): O NEG
Antibody Screen: NEGATIVE

## 2021-11-15 LAB — ETHANOL: Alcohol, Ethyl (B): 315 mg/dL (ref <10)

## 2021-11-15 LAB — MAGNESIUM: Magnesium: 2 mg/dL (ref 1.7–2.4)

## 2021-11-15 LAB — RESP PANEL BY RT-PCR (FLU A&B, COVID) ARPGX2
Influenza A by PCR: NEGATIVE
Influenza B by PCR: NEGATIVE
SARS Coronavirus 2 by RT PCR: POSITIVE — AB

## 2021-11-15 LAB — TROPONIN I (HIGH SENSITIVITY): Troponin I (High Sensitivity): 9 ng/L (ref <18)

## 2021-11-15 LAB — BRAIN NATRIURETIC PEPTIDE: B Natriuretic Peptide: 11.2 pg/mL (ref 0.0–100.0)

## 2021-11-15 LAB — PHOSPHORUS: Phosphorus: 2.7 mg/dL (ref 2.5–4.6)

## 2021-11-15 MED ORDER — SPIRONOLACTONE 25 MG PO TABS
100.0000 mg | ORAL_TABLET | Freq: Every day | ORAL | Status: DC
Start: 2021-11-15 — End: 2021-11-15

## 2021-11-15 MED ORDER — POTASSIUM CHLORIDE 20 MEQ PO PACK
40.0000 meq | PACK | Freq: Once | ORAL | Status: AC
  Administered 2021-11-15: 40 meq via ORAL
  Filled 2021-11-15: qty 2

## 2021-11-15 MED ORDER — LORAZEPAM 2 MG/ML IJ SOLN
2.0000 mg | INTRAMUSCULAR | Status: AC | PRN
  Administered 2021-11-16 (×3): 2 mg via INTRAVENOUS
  Filled 2021-11-15 (×3): qty 1

## 2021-11-15 MED ORDER — SODIUM CHLORIDE 0.9 % IV SOLN: INTRAVENOUS | Status: AC

## 2021-11-15 MED ORDER — ADULT MULTIVITAMIN W/MINERALS CH
1.0000 | ORAL_TABLET | Freq: Every day | ORAL | Status: DC
  Administered 2021-11-17: 1 via ORAL
  Filled 2021-11-15: qty 1

## 2021-11-15 MED ORDER — SODIUM CHLORIDE 0.9 % IV SOLN
50.0000 ug/h | INTRAVENOUS | Status: DC
  Administered 2021-11-15 – 2021-11-17 (×4): 50 ug/h via INTRAVENOUS
  Filled 2021-11-15 (×6): qty 1

## 2021-11-15 MED ORDER — VITAMIN B-1 100 MG PO TABS
100.0000 mg | ORAL_TABLET | Freq: Every day | ORAL | Status: DC
Start: 2021-11-16 — End: 2021-11-17
  Administered 2021-11-17: 08:00:00 100 mg via ORAL
  Filled 2021-11-15 (×3): qty 1

## 2021-11-15 MED ORDER — PANTOPRAZOLE 80MG IVPB - SIMPLE MED
80.0000 mg | Freq: Once | INTRAVENOUS | Status: AC
  Administered 2021-11-15: 80 mg via INTRAVENOUS
  Filled 2021-11-15: qty 100

## 2021-11-15 MED ORDER — ONDANSETRON HCL 4 MG PO TABS
4.0000 mg | ORAL_TABLET | Freq: Four times a day (QID) | ORAL | Status: DC | PRN
  Administered 2021-11-15: 4 mg via ORAL
  Filled 2021-11-15: qty 1

## 2021-11-15 MED ORDER — CLONIDINE HCL 0.1 MG PO TABS: 0.1000 mg | ORAL_TABLET | Freq: Three times a day (TID) | ORAL | Status: DC | PRN

## 2021-11-15 MED ORDER — FUROSEMIDE 40 MG PO TABS
40.0000 mg | ORAL_TABLET | Freq: Every day | ORAL | Status: DC
  Administered 2021-11-17: 40 mg via ORAL
  Filled 2021-11-15: qty 1

## 2021-11-15 MED ORDER — ACETAMINOPHEN 650 MG RE SUPP: 650.0000 mg | Freq: Four times a day (QID) | RECTAL | Status: DC | PRN

## 2021-11-15 MED ORDER — HEPARIN SODIUM (PORCINE) 5000 UNIT/ML IJ SOLN
5000.0000 [IU] | Freq: Three times a day (TID) | INTRAMUSCULAR | Status: AC
  Administered 2021-11-15: 5000 [IU] via SUBCUTANEOUS
  Filled 2021-11-15: qty 1

## 2021-11-15 MED ORDER — FUROSEMIDE 40 MG PO TABS: 20.0000 mg | ORAL_TABLET | Freq: Two times a day (BID) | ORAL | Status: DC

## 2021-11-15 MED ORDER — SPIRONOLACTONE 25 MG PO TABS
100.0000 mg | ORAL_TABLET | Freq: Every day | ORAL | Status: DC
  Administered 2021-11-17: 100 mg via ORAL
  Filled 2021-11-15: qty 4

## 2021-11-15 MED ORDER — CEFTRIAXONE SODIUM 1 G IJ SOLR
1.0000 g | Freq: Once | INTRAMUSCULAR | Status: AC
  Administered 2021-11-15: 1 g via INTRAVENOUS
  Filled 2021-11-15: qty 10

## 2021-11-15 MED ORDER — ONDANSETRON HCL 4 MG/2ML IJ SOLN: 4.0000 mg | Freq: Four times a day (QID) | INTRAMUSCULAR | Status: DC | PRN

## 2021-11-15 MED ORDER — FOLIC ACID 1 MG PO TABS
1.0000 mg | ORAL_TABLET | Freq: Every day | ORAL | Status: DC
  Administered 2021-11-17: 08:00:00 1 mg via ORAL
  Filled 2021-11-15: qty 1

## 2021-11-15 MED ORDER — FUROSEMIDE 40 MG PO TABS: 40.0000 mg | ORAL_TABLET | Freq: Every day | ORAL | Status: DC

## 2021-11-15 MED ORDER — THERA-M PO TABS: 1.0000 | ORAL_TABLET | Freq: Every day | ORAL | Status: DC

## 2021-11-15 MED ORDER — ACETAMINOPHEN 325 MG PO TABS
325.0000 mg | ORAL_TABLET | Freq: Four times a day (QID) | ORAL | Status: DC | PRN
  Administered 2021-11-16: 325 mg via ORAL
  Filled 2021-11-15: qty 1

## 2021-11-15 MED ORDER — HEPARIN SODIUM (PORCINE) 5000 UNIT/ML IJ SOLN: 5000.0000 [IU] | Freq: Three times a day (TID) | INTRAMUSCULAR | Status: DC

## 2021-11-15 MED ORDER — PANTOPRAZOLE SODIUM 40 MG IV SOLR: 40.0000 mg | Freq: Two times a day (BID) | INTRAVENOUS | Status: DC

## 2021-11-15 MED ORDER — NICOTINE 21 MG/24HR TD PT24
21.0000 mg | MEDICATED_PATCH | Freq: Every day | TRANSDERMAL | Status: DC | PRN
  Administered 2021-11-15 – 2021-11-16 (×2): 21 mg via TRANSDERMAL
  Filled 2021-11-15 (×2): qty 1

## 2021-11-15 MED ORDER — SPIRONOLACTONE 25 MG PO TABS: 50.0000 mg | ORAL_TABLET | Freq: Two times a day (BID) | ORAL | Status: DC

## 2021-11-15 MED ORDER — OCTREOTIDE LOAD VIA INFUSION
50.0000 ug | Freq: Once | INTRAVENOUS | Status: AC
  Administered 2021-11-15: 50 ug via INTRAVENOUS
  Filled 2021-11-15: qty 25

## 2021-11-15 MED ORDER — PANTOPRAZOLE INFUSION (NEW) - SIMPLE MED
8.0000 mg/h | INTRAVENOUS | Status: DC
  Administered 2021-11-15 – 2021-11-17 (×4): 8 mg/h via INTRAVENOUS
  Filled 2021-11-15 (×4): qty 100

## 2021-11-15 NOTE — H&P (Addendum)
History and Physical   Omar Turner OZH:[id number] DOB: [birth year] DOA: 11/15/2021  PCP: Omar Hector, MD  Outpatient Specialists: [facility] Patient coming from: Home  I have personally briefly reviewed patient's old medical records in [place].  Chief Concern: Alcohol detox and abdominal pain  HPI: Omar Turner is a 51 year old male with history of alcohol abuse and dependence, incarcerated ventral hernia, compensated liver cirrhosis, history of abdominal ascites, GERD, Anxiety, who presents to the emergency department for chief concerns of alcohol detox.  He further endorses that he has had abdominal pain, vomited blood x2, and had 2 episodes of melena stool this week.  Vitals in the emergency department show temperature of 98.1, respiration rate 17, heart rate of 101, blood pressure 150/97, SPO2 of 95% on room air.  Serum sodium showed 130, potassium 3.1, chloride 92, bicarb 24, BUN of 8, serum creatinine 0.78, nonfasting blood glucose 142.  WBC was 11, hemoglobin 15.8, platelets of 141.  AST was elevated at 254, ALT of 194, T. bili is 1.4.  Alcohol level was elevated at 315.  UA was negative for nitrates and leukocytes.  UDS was negative.  In the emergency department patient was started on octreotide gtt., Protonix bolus and gtt., and patient received ceftriaxone 1 g IV dose.  GI provider was consulted and Omar Turner has scheduled patient for EGD in the a.m.  At bedside, he is able to tell me his name, age, current calendar year, and identify is girlfriend Omar Turner at bedside.  Negative for asterixis.  He states he states that his main concern was bright red blood per rectum every day for two weeks, nausea and vomiting daily (2-3x), clear or yellow with blood streaks. He states he takes iron tablets daily and has black stool daily.   He endorses shortness of breath. He endorses chest pain, piercing, and sometimes constant, that comes and goes. He  reports the shortness of breath and chest pain for about two months. He reports swelling in his legs for about two years.   He states the shortness of breath is spontaneous and comes with he is doing something and sometimes when he is laying down.   He denies known weight gain. He endorses presyncopal/near syncope with exertion.   Social history: He lives by himself. He smokes 2 ppd. He drinks 1/5th per day. He works for [employer] for [employer] history: He is vaccinated with two doses of covid 19, he is not vaccinated for the flu.  ROS: Constitutional: no weight change, no fever ENT/Mouth: no sore throat, no rhinorrhea Eyes: no eye pain, no vision changes Cardiovascular: + chest pain, + dyspnea,  + edema, no palpitations Respiratory: no cough, no sputum, no wheezing Gastrointestinal: no nausea, no vomiting, no diarrhea, no constipation Genitourinary: no urinary incontinence, no dysuria, no hematuria Musculoskeletal: no arthralgias, no myalgias Skin: no skin lesions, no pruritus, Neuro: + weakness, no loss of consciousness, no syncope Psych: no anxiety, no depression, decrease appetite Heme/Lymph: no bruising, + bleeding from rectum  ED Course: Discussed with emergency medicine provider, patient required hospitalization for chief concerns of upper GI bleed.  Assessment/Plan  Principal Problem:   Upper GI bleed Active Problems:   Anxiety state   Gastroesophageal reflux disease without esophagitis   Hypertension   Other hyperlipidemia   Sleep apnea   Tobacco abuse   Alcohol abuse with withdrawal (HCC)   Alcohol dependence (HCC)   Elevated LFTs   Hypokalemia  Melena   Nausea & vomiting   Alcoholic cirrhosis of liver with ascites (HCC)   Electrolyte abnormality   * Upper GI bleed Assessment & Plan - Presumed secondary to alcohol abuse and dependence with chronic use - EtOH elevated at 315 on admission - Clear liquids now, n.p.o. after  midnight - Current hemoglobin is 15.8, presumed secondary to some contraction in setting of dehydration - Hemoglobin goal of greater than 8 - Epic order placed for gastroenterology consultation to Omar Turner placed  Digestive Alcoholic cirrhosis of liver with ascites ([place]) Assessment & Plan - Appears compensated at this time - Resumed home spironolactone 50 mg p.o. twice daily and furosemide 20 mg p.o. twice daily  Nausea & vomiting Assessment & Plan - Supportive measures with ondansetron ordered  Nervous and Auditory Alcohol abuse with withdrawal ([place]) Assessment & Plan - Unlikely the patient will withdrawal in the next 24 hours as patient had EtOH prior to presenting to the emergency department - Ethanol level on admission was 315 - Ativan 2 mg IV as needed, 3 doses ordered with instructions to administer and then let provider know  Other Electrolyte abnormality Mild asymptomatic hyponatremia Assessment & Plan - Hyponatremia and hypochloremia -This is likely secondary to alcohol abuse - Check urine sodium, urine osmolality, serum osmolality - Sodium chloride 100 mL/h, 1 day ordered - CMP in the a.m.  Hypokalemia Assessment & Plan - Check magnesium - Potassium chloride 40 mill equivalent, by mouth, one-time dose ordered  Elevated LFTs Assessment & Plan - AST 254, ALT 194, elevated T. bili of 1.4 - Presumed secondary to upper GI bleed in setting of chronic alcohol abuse and dependence  Tobacco abuse Assessment & Plan - Nicotine patch as needed for nicotine craving ordered  Anxiety state Assessment & Plan - Lorazepam injection 2 mg IV as needed for anxiety and or seizure, 3 doses ordered with instructions to administer and then let provider know  Chart reviewed.   DVT prophylaxis: TED hose Code Status: Full code Diet: Clear liquids; n.p.o. after midnight Family Communication: Updated girlfriend with patient's permission Disposition Plan: Pending clinical  course Consults called: GI Admission status: Telemetry cardiac, observation  Past Medical History:  Diagnosis Date   Anginal pain ([place])    Anxiety    Degenerative disc disease, cervical    Diverticulitis large intestine    GERD (gastroesophageal reflux disease)    History of chest pain    Hyperlipidemia    Hypertension    Sleep apnea    Tobacco abuse    Past Surgical History:  Procedure Laterality Date   APPENDECTOMY     CARDIAC CATHETERIZATION Left 06/17/2016   Procedure: Left Heart Cath and Coronary Angiography;  Surgeon: Wellington Hampshire, MD;  Location: [place] [facility];  Service: Cardiovascular;  Laterality: Left;   COLONOSCOPY     COLONOSCOPY WITH PROPOFOL N/A 09/07/2015   Procedure: COLONOSCOPY WITH PROPOFOL;  Surgeon: Hulen Luster, MD;  Location: [facility] ENDOSCOPY;  Service: Gastroenterology;  Laterality: N/A;   COLONOSCOPY WITH PROPOFOL N/A 05/12/2018   Procedure: COLONOSCOPY WITH PROPOFOL;  Surgeon: Jonathon Bellows, MD;  Location: [facility] ENDOSCOPY;  Service: Gastroenterology;  Laterality: N/A;   HERNIA REPAIR     IR RADIOLOGIST EVAL & MGMT  02/01/2021   VENTRAL HERNIA REPAIR N/A 01/17/2021   Procedure: HERNIA REPAIR VENTRAL ADULT;  Surgeon: Jules Husbands, MD;  Location: [facility] ORS;  Service: General;  Laterality: N/A;   Social History:  reports that he has been smoking. He has  a 22.50 pack-year smoking history. He has never used smokeless tobacco. He reports current alcohol use of about 2.0 standard drinks per week. He reports that he does not use drugs.  Allergies  Allergen Reactions   Chantix [Varenicline]    Lexapro [Escitalopram]         Family History  Problem Relation Age of Onset   Hypertension Father    Heart disease Father        CABG x 3 & valve replacement.    Heart attack Father    Family history: Family history reviewed and pertinent for heart disease in father.  Prior to Admission medications   Medication Sig Start Date End Date Taking? Authorizing Provider   furosemide (LASIX) 20 MG tablet Take 1 tablet (20 mg total) by mouth 2 (two) times daily. 01/19/21   Tylene Fantasia, PA-C  oxyCODONE 10 MG TABS Take 1 tablet (10 mg total) by mouth every 6 (six) hours as needed for severe pain or breakthrough pain. Patient not taking: Reported on 10/25/2021 01/19/21   Tylene Fantasia, PA-C  pantoprazole (PROTONIX) 40 MG tablet Take 1 tablet (40 mg total) by mouth daily for 15 doses. Patient taking differently: Take 40 mg by mouth 2 (two) times daily. 12/08/20 12/23/20  Jan Fireman, PA-C  spironolactone (ALDACTONE) 25 MG tablet Take 2 tablets (50 mg total) by mouth 2 (two) times daily. 01/19/21   Tylene Fantasia, PA-C   Physical Exam: Vitals:   11/15/21 2100 11/15/21 2130 11/15/21 2200 11/15/21 2230  BP: 133/90 (!) 139/96 119/72 128/84  Pulse: 99 98 93 (!) 107  Resp: (!) 21 14 16  (!) 21  Temp:      TempSrc:      SpO2: 95% 96% 94% 90%  Weight:      Height:       Constitutional: appears age-appropriate, NAD, calm, comfortable Eyes: PERRL, lids and conjunctivae normal ENMT: Mucous membranes are moist. Posterior pharynx clear of any exudate or lesions. Age-appropriate dentition. Hearing appropriate. Neck: normal, supple, no masses, no thyromegaly Respiratory: clear to auscultation bilaterally, no wheezing, no crackles. Normal respiratory effort. No accessory muscle use.  Cardiovascular: Regular rate and rhythm, no murmurs / rubs / gallops. No extremity edema. 2+ pedal pulses. No carotid bruits.  Abdomen: Obese abdomen, no tenderness, no masses palpated, no hepatosplenomegaly. Bowel sounds positive.  Musculoskeletal: no clubbing / cyanosis. No joint deformity upper and lower extremities. Good ROM, no contractures, no atrophy. Normal muscle tone.  Skin: no rashes, lesions, ulcers. No induration Neurologic: Sensation intact. Strength 5/5 in all 4.  Psychiatric: Normal judgment and insight. Alert and oriented x 3. Normal mood.   EKG: independently reviewed,  showing sinus tachycardia with rate of 102, QTc 498  Chest x-ray on Admission: I personally reviewed and I agree with radiologist reading as below.  US Abdomen Limited  Result Date: 11/15/2021 CLINICAL DATA:  Abdominal distension EXAM: LIMITED ABDOMEN ULTRASOUND FOR ASCITES TECHNIQUE: Limited ultrasound survey for ascites was performed in all four abdominal quadrants. COMPARISON:  02/23/2021 FINDINGS: No significant ascites is seen IMPRESSION: No significant ascites is seen. Electronically Signed   By: Donavan Foil M.D.   On: 11/15/2021 19:19    Labs on Admission: I have personally reviewed following labs  CBC: Recent Labs  Lab 11/15/21 1756  WBC 11.0*  HGB 15.8  HCT 43.8  MCV 95.4  PLT 106*   Basic Metabolic Panel: Recent Labs  Lab 11/15/21 1756 11/15/21 1803  NA 130*  --  K 3.1*  --   CL 92*  --   CO2 24  --   GLUCOSE 142*  --   BUN 8  --   CREATININE 0.76  --   CALCIUM 8.4*  --   MG  --  2.0  PHOS  --  2.7   GFR: Estimated Creatinine Clearance: 135.9 mL/min (by C-G formula based on SCr of 0.76 mg/dL).  Liver Function Tests: Recent Labs  Lab 11/15/21 1756  AST 254*  ALT 194*  ALKPHOS 116  BILITOT 1.4*  PROT 8.1  ALBUMIN 4.0   Recent Labs  Lab 11/15/21 1756  LIPASE 48   Urine analysis:    Component Value Date/Time   COLORURINE YELLOW (A) 11/15/2021 1759   APPEARANCEUR CLEAR (A) 11/15/2021 1759   APPEARANCEUR Clear 11/02/2013 1724   LABSPEC 1.008 11/15/2021 1759   LABSPEC 1.020 11/02/2013 1724   PHURINE 6.0 11/15/2021 1759   GLUCOSEU NEGATIVE 11/15/2021 1759   GLUCOSEU Negative 11/02/2013 1724   HGBUR NEGATIVE 11/15/2021 1759   BILIRUBINUR NEGATIVE 11/15/2021 1759   BILIRUBINUR Negative 11/02/2013 1724   KETONESUR NEGATIVE 11/15/2021 1759   PROTEINUR NEGATIVE 11/15/2021 1759   NITRITE NEGATIVE 11/15/2021 1759   LEUKOCYTESUR NEGATIVE 11/15/2021 1759   LEUKOCYTESUR Negative 11/02/2013 1724   Dr. Tobie Poet [facility]  If 7PM-7AM, please  contact overnight-coverage provider If 7AM-7PM, please contact day coverage provider [email or web address]  11/15/2021, 11:13 PM

## 2021-11-15 NOTE — H&P (View-Only) (Signed)
GI Inpatient Consult Note  Reason for Consult: Hematemesis/Melena   Attending Requesting Consult: Dr. Rupert Stacks  History of Present Illness: Omar Turner is a 51 y.o. male seen for evaluation of hematemesis and melena at the request of admitting hospitalist - Dr. Rupert Stacks. Patient has a PMH of HTN, HLD, GERD, Hx of diverticulitis, anxiety, DDD, tobacco abuse, and compensated alcoholic cirrhosis of the liver followed by [facility] Hepatology (last seen 03/2021). He presented to the [facility] ED this evening for chief complaint of 2-week history of hematemesis and melena. He reports that over the past two weeks he has been having about 2-3 episodes of hematemesis and 2-3 bowel movements which were black and tarry and also some gross bright red/dark blood. He got concerned today because he started to have some upper abdominal discomfort and distention with concern for return of his abdominal ascites. He rates abdominal pain currently as 8/10 in severity. Pain is located diffusely across his abdomen but worse in upper abdomen. He describes it as bloating and pressure. He did drink 1 pint of liquor today.He has not had any episodes of hematemesis or melena in the ED since arrival. He is here with his girlfriend. He was diagnosed with alcoholic cirrhosis of the liver with ascites during hospitalization at [facility] 12/2020 where he underwent diagnostic and therapeutic paracentesis with removal of 6L of fluid with SAAG >1.1 c/w portal hypertension. He also underwent work-up for IDA with bidirectional endoscopy which showed no evidence of varices, but did show portal hypertensive gastropathy, sigmoid diverticulosis, and four subcentimeter colon polyps. He was discharged with diuretics and established care locally at the [facility]. He saw Laurine Blazer, PA-C, at the [facility] and was referred to Transplant Hepatology. He saw Dr. Donnella Bi 03/2021 at [facility]. He was then admitted 01/2021 for strangulated umbilical hernia  with SBO s/p repair. He has admitted 11/27 - 09/05/21 at [facility] for alcohol withdrawal. Previous hospital admissions for alcohol withdrawal and requesting detox 08/2019, 04/2020, and 09/2020.  Upon presentation to the ED tonight, vital signs showed mild tachycardia with HR 101 bpm, hypertensive 150/97, and O2 sats 95% on room air. Labs were significant for WBC 11K, Plt 141K, sodium 130, potassium 3.1, BUN 8, serum creatinine 0.78, hemoglobin 15.8, AST 254, ALT 194, total bilirubin 1.4, ethanol 315, and INR 1.1. Urine drug screen was negative. In the ED, he had RUQ US performed which showed no evidence of ascites. He was started on octreotide gtt, Protonix bolus and gtt, and received 1 dose of Ceftriaxone. GI consulted for further evaluation and management.    Summary of GI Procedures:  EGD 12/13/20: no varices, 2cm HH, PHG Colonoscopy 12/13/20: non-bleeding hemorrhoids, sigmoid diverticulosis, four subcentimeter polyps  Past Medical History:  Past Medical History:  Diagnosis Date   Anginal pain ([place])    Anxiety    Degenerative disc disease, cervical    Diverticulitis large intestine    GERD (gastroesophageal reflux disease)    History of chest pain    Hyperlipidemia    Hypertension    Sleep apnea    Tobacco abuse     Problem List: Patient Active Problem List   Diagnosis Date Noted   Upper GI bleed 16/07/9603   Alcoholic cirrhosis of liver with ascites ([place]) 11/15/2021   Electrolyte abnormality 11/15/2021   Incarcerated ventral hernia 01/17/2021   Other specified anemias 01/03/2021   Alcohol dependence ([place]) 01/02/2021   Portal hypertensive gastropathy ([place]) 01/02/2021   Ascites 12/11/2020   Bilateral  lower extremity edema 12/11/2020   Melena 12/11/2020   COVID-19 09/23/2020   Elevated LFTs 09/23/2020   Hypokalemia 09/23/2020   Nausea & vomiting 09/23/2020   Alcohol abuse with withdrawal ([place]) 08/09/2019   DDD (degenerative disc disease), cervical 03/20/2018   Hypertension  03/20/2018   Sleep apnea 03/20/2018   Diverticulitis of large intestine with perforation without abscess or bleeding 03/15/2018   Gastroesophageal reflux disease without esophagitis 04/03/2017   History of chest pain 04/03/2017   Other hyperlipidemia 04/03/2017   Precordial pain    Tobacco abuse 08/21/2015   Anxiety state 02/15/2015    Past Surgical History: Past Surgical History:  Procedure Laterality Date   APPENDECTOMY     CARDIAC CATHETERIZATION Left 06/17/2016   Procedure: Left Heart Cath and Coronary Angiography;  Surgeon: Wellington Hampshire, MD;  Location: [place] CV LAB;  Service: Cardiovascular;  Laterality: Left;   COLONOSCOPY     COLONOSCOPY WITH PROPOFOL N/A 09/07/2015   Procedure: COLONOSCOPY WITH PROPOFOL;  Surgeon: Hulen Luster, MD;  Location: [facility] ENDOSCOPY;  Service: Gastroenterology;  Laterality: N/A;   COLONOSCOPY WITH PROPOFOL N/A 05/12/2018   Procedure: COLONOSCOPY WITH PROPOFOL;  Surgeon: Jonathon Bellows, MD;  Location: [facility] ENDOSCOPY;  Service: Gastroenterology;  Laterality: N/A;   HERNIA REPAIR     IR RADIOLOGIST EVAL & MGMT  02/01/2021   VENTRAL HERNIA REPAIR N/A 01/17/2021   Procedure: HERNIA REPAIR VENTRAL ADULT;  Surgeon: Jules Husbands, MD;  Location: [facility] ORS;  Service: General;  Laterality: N/A;    Allergies: Allergies  Allergen Reactions   Chantix [Varenicline]    Lexapro [Escitalopram]          Home Medications: (Not in a hospital admission)  Home medication reconciliation was completed with the patient.   Scheduled Inpatient Medications:    [START ON 06/20/7828] folic acid  1 mg Oral Daily   [START ON 11/16/2021] furosemide  40 mg Oral Daily   [START ON 11/16/2021] multivitamin with minerals  1 tablet Oral Daily   [START ON 11/19/2021] pantoprazole  40 mg Intravenous Q12H   [START ON 11/16/2021] spironolactone  100 mg Oral Daily   [START ON 11/16/2021] thiamine  100 mg Oral Daily    Continuous Inpatient Infusions:    sodium chloride 100 mL/hr at  11/15/21 2111   octreotide  (SANDOSTATIN)    IV infusion 50 mcg/hr (11/15/21 2128)   pantoprazole 8 mg/hr (11/15/21 2109)    PRN Inpatient Medications:  acetaminophen **OR** acetaminophen, cloNIDine, LORazepam, nicotine, ondansetron **OR** ondansetron (ZOFRAN) IV  Family History: family history includes Heart attack in his father; Heart disease in his father; Hypertension in his father.  The patient's family history is negative for inflammatory bowel disorders, GI malignancy, or solid organ transplantation.  Social History:   reports that he has been smoking. He has a 22.50 pack-year smoking history. He has never used smokeless tobacco. He reports current alcohol use of about 2.0 standard drinks per week. He reports that he does not use drugs. The patient denies ETOH, tobacco, or drug use.   Review of Systems: Constitutional: Weight is stable.  Eyes: No changes in vision. ENT: No oral lesions, sore throat.  GI: see HPI.  Heme/Lymph: No easy bruising.  CV: No chest pain.  GU: No hematuria.  Integumentary: No rashes.  Neuro: No headaches.  Psych: No depression/anxiety.  Endocrine: No heat/cold intolerance.  Allergic/Immunologic: No urticaria.  Resp: No cough, SOB.  Musculoskeletal: No joint swelling.    Physical Examination: BP (!) 150/97 (BP  Location: Left Arm)    Pulse (!) 101    Temp 98.1 F (36.7 C) (Oral)    Resp 17    Ht 5\' 11"  (1.803 m)    Wt 99.8 kg    SpO2 95%    BMI 30.68 kg/m  Non-toxic appearing male in ED stretcher. Girlfriend present. No accessory muscle use. Gen: NAD, alert and oriented x 4 HEENT: PEERLA, EOMI, Neck: supple, no JVD or thyromegaly Chest: CTA bilaterally, no wheezes, crackles, or other adventitious sounds CV: RRR, no m/g/c/r Abd: soft, mildly distended, hypoactive bowel sounds, tender to deep palpation in RUQ and epigastrium, no HSM, guarding, ridigity, or rebound tenderness Ext: no edema, well perfused with 2+ pulses, Skin: no rash or lesions  noted Lymph: no LAD  Data: Lab Results  Component Value Date   WBC 11.0 (H) 11/15/2021   HGB 15.8 11/15/2021   HCT 43.8 11/15/2021   MCV 95.4 11/15/2021   PLT 141 (L) 11/15/2021   Recent Labs  Lab 11/15/21 1756  HGB 15.8   Lab Results  Component Value Date   NA 130 (L) 11/15/2021   K 3.1 (L) 11/15/2021   CL 92 (L) 11/15/2021   CO2 24 11/15/2021   BUN 8 11/15/2021   CREATININE 0.76 11/15/2021   Lab Results  Component Value Date   ALT 194 (H) 11/15/2021   AST 254 (H) 11/15/2021   ALKPHOS 116 11/15/2021   BILITOT 1.4 (H) 11/15/2021   Recent Labs  Lab 11/15/21 1930  INR 1.1   Assessment/Plan:  51 y/o Caucasian male with a PMH of HTN, HLD, GERD, Hx of diverticulitis, anxiety, DDD, tobacco abuse, and compensated alcoholic cirrhosis of the liver followed by [facility] Hepatology (last seen 03/2021) with ongoing EtOH abuse presented to the [facility] ED this evening for chief complaint of 2-week history of hematemesis and melena. GI consulted for further evaluation and management.   Compensated alcohol cirrhosis of the liver - MELD-Na 17, Child-Pugh Class A (5 pts).   Hematemesis/Melena - hemodynamically stable with hemoglobin 15.8, he reports 2-week history of daily hematemesis and melena which is concerning for upper GI bleed. Differential diagnosis includes peptic ulcer disease, gastritis, erosive esophagitis, Mallory-Weiss tear, variceal bleed, AVMs, malignancy, Dieulafoy's lesion, polyp, etc.   Alcohol abuse with hx of multiple admissions for withdrawal - Ethanol level 315 on admission. CIWA protocol.   Electrolyte derangement - IVF hydration for repletion   Elevated LFTs - 2/2 ongoing EtOH abuse in setting of cirrhosis   Recommendations:  - Maintain 2 large bore IVs for access - Continue to monitor serial H&H with goal hemoglobin >8.0.  - Currently hemodynamically stable with no evidence of overt GI hemorrhage - Continue serial abdominal examinations - CIWA protocol. Ativan  2 mg IV as needed.  - Continue octreotide gtt for total of 72 hours. Continue Protonix gtt.  - Replete electrolytes as needed - RUQ [place] tonight with no evidence of ascites. Home dose diuretics can be continued at this time. Paracentesis not indicated.  - Discussed with patient that strict alcohol cessation is strongly advised to prevent further hepatic decompensation. He is open to detox and wants to quit drinking. Hopefully decisions can be made this hospitalization to assist him.  - Advise EGD for endoluminal evaluation. Discussed procedure details and indications tonight in the ED with patient and wife. He consents to proceed.  - Plan for EGD tomorrow with Dr. Virgina Jock - Clear liquid diet for now. NPO after midnight.  - See procedure note for findings  and further recommendations  I reviewed the risks (including bleeding, perforation, infection, anesthesia complications, cardiac/respiratory complications), benefits and alternatives of EGD. Patient consents to proceed.    Thank you for the consult. Please call with questions or concerns.  Reeves Forth [facility]enterology [phone] (Cell)

## 2021-11-15 NOTE — Assessment & Plan Note (Signed)
-   Lorazepam injection 2 mg IV as needed for anxiety and or seizure, 3 doses ordered with instructions to administer and then let provider know

## 2021-11-15 NOTE — Assessment & Plan Note (Signed)
-   AST 254, ALT 194, elevated T. bili of 1.4 - Presumed secondary to upper GI bleed in setting of chronic alcohol abuse and dependence

## 2021-11-15 NOTE — Assessment & Plan Note (Addendum)
-   Check magnesium - Potassium chloride 40 mill equivalent, by mouth, one-time dose ordered

## 2021-11-15 NOTE — Assessment & Plan Note (Signed)
-   Supportive measures with ondansetron ordered

## 2021-11-15 NOTE — Assessment & Plan Note (Addendum)
-   Hyponatremia and hypochloremia -This is likely secondary to alcohol abuse - Sodium chloride 100 mL/h, 1 day ordered - CMP in the a.m.

## 2021-11-15 NOTE — ED Provider Notes (Signed)
[facility] Provider Note    Event Date/Time   First MD Initiated Contact with Patient 11/15/21 1834     (approximate)   History   Detox and Abdominal Pain   HPI  Omar Turner is a 51 y.o. male with a history of alcoholic cirrhosis and persistent alcohol abuse presents to the ER for evaluation of 2 episodes of bloody emesis today with melena over the past several days.  Not on any blood thinners.  States he feels like his belly is more distended.  Is having some discomfort.  Did drink alcohol today.  Is wanting help with detox.     Physical Exam   Triage Vital Signs: ED Triage Vitals  Enc Vitals Group     BP 11/15/21 1754 (!) 150/97     Pulse Rate 11/15/21 1754 (!) 101     Resp 11/15/21 1754 17     Temp 11/15/21 1754 98.1 F (36.7 C)     Temp Source 11/15/21 1754 Oral     SpO2 11/15/21 1754 95 %     Weight 11/15/21 1755 220 lb (99.8 kg)     Height 11/15/21 1755 5\' 11"  (1.803 m)     Head Circumference --      Peak Flow --      Pain Score 11/15/21 1755 9     Pain Loc --      Pain Edu? --      Excl. in [place]? --     Most recent vital signs: Vitals:   11/15/21 1754  BP: (!) 150/97  Pulse: (!) 101  Resp: 17  Temp: 98.1 F (36.7 C)  SpO2: 95%     Constitutional: Alert  Eyes: Conjunctivae are normal.  Head: Atraumatic. Nose: No congestion/rhinnorhea. Mouth/Throat: Mucous membranes are moist.   Neck: Painless ROM.  Cardiovascular:   Good peripheral circulation. Respiratory: Normal respiratory effort.  No retractions.  Gastrointestinal: Soft and nontender. + fluid wave Musculoskeletal:  no deformity Neurologic:  MAE spontaneously. No gross focal neurologic deficits are appreciated.  Skin:  Skin is warm, dry and intact. No rash noted. Psychiatric: Mood and affect are normal. Speech and behavior are normal.    ED Results / Procedures / Treatments   Labs (all labs ordered are listed, but only abnormal results are displayed) Labs  Reviewed  COMPREHENSIVE METABOLIC PANEL - Abnormal; Notable for the following components:      Result Value   Sodium 130 (*)    Potassium 3.1 (*)    Chloride 92 (*)    Glucose, Bld 142 (*)    Calcium 8.4 (*)    AST 254 (*)    ALT 194 (*)    Total Bilirubin 1.4 (*)    All other components within normal limits  CBC - Abnormal; Notable for the following components:   WBC 11.0 (*)    MCH 34.4 (*)    MCHC 36.1 (*)    Platelets 141 (*)    All other components within normal limits  URINALYSIS, ROUTINE W REFLEX MICROSCOPIC - Abnormal; Notable for the following components:   Color, Urine YELLOW (*)    APPearance CLEAR (*)    All other components within normal limits  ETHANOL - Abnormal; Notable for the following components:   Alcohol, Ethyl (B) 315 (*)    All other components within normal limits  RESP PANEL BY RT-PCR (FLU A&B, COVID) ARPGX2  LIPASE, BLOOD  URINE DRUG SCREEN, QUALITATIVE ([facility] ONLY)  PROTIME-INR  CBC  MAGNESIUM  PHOSPHORUS  COMPREHENSIVE METABOLIC PANEL  BRAIN NATRIURETIC PEPTIDE  HIV ANTIBODY (ROUTINE TESTING W REFLEX)  TYPE AND SCREEN  TROPONIN I (HIGH SENSITIVITY)       RADIOLOGY Please see ED Course for my review and interpretation.  I personally reviewed all radiographic images ordered to evaluate for the above acute complaints and reviewed radiology reports and findings.  These findings were personally discussed with the patient.  Please see medical record for radiology report.    PROCEDURES:  Critical Care performed: No  Procedures   MEDICATIONS ORDERED IN ED: Medications  pantoprozole (PROTONIX) 80 mg /NS 100 mL infusion (8 mg/hr Intravenous New Bag/Given 11/15/21 2109)  pantoprazole (PROTONIX) injection 40 mg (has no administration in time range)  octreotide (SANDOSTATIN) 2 mcg/mL load via infusion 50 mcg (has no administration in time range)    And  octreotide (SANDOSTATIN) 500 mcg in sodium chloride 0.9 % 250 mL (2 mcg/mL) infusion (has  no administration in time range)  acetaminophen (TYLENOL) tablet 325 mg (has no administration in time range)    Or  acetaminophen (TYLENOL) suppository 650 mg (has no administration in time range)  ondansetron (ZOFRAN) tablet 4 mg (has no administration in time range)    Or  ondansetron (ZOFRAN) injection 4 mg (has no administration in time range)  cloNIDine (CATAPRES) tablet 0.1 mg (has no administration in time range)  LORazepam (ATIVAN) injection 2 mg (has no administration in time range)  nicotine (NICODERM CQ - dosed in mg/24 hours) patch 21 mg (has no administration in time range)  heparin injection 5,000 Units (has no administration in time range)  0.9 %  sodium chloride infusion ( Intravenous New Bag/Given 11/15/21 2111)  potassium chloride (KLOR-CON) packet 40 mEq (has no administration in time range)  folic acid (FOLVITE) tablet 1 mg (has no administration in time range)  thiamine (Vitamin B-1) tablet 100 mg (has no administration in time range)  spironolactone (ALDACTONE) tablet 100 mg (has no administration in time range)  furosemide (LASIX) tablet 40 mg (has no administration in time range)  multivitamin with minerals tablet 1 tablet (has no administration in time range)  pantoprazole (PROTONIX) 80 mg /NS 100 mL IVPB (0 mg Intravenous Stopped 11/15/21 2053)  cefTRIAXone (ROCEPHIN) 1 g in sodium chloride 0.9 % 100 mL IVPB (0 g Intravenous Stopped 11/15/21 2030)     IMPRESSION / MDM / ASSESSMENT AND PLAN / ED COURSE  I reviewed the triage vital signs and the nursing notes.                              Differential diagnosis includes, but is not limited to, ugib, lgib, variceal bleed, anemia, sbp, cirrhosis  Patient presenting with symptoms as described above.  Currently hemodynamically stable  Feels like his abdomen is distended will order ultrasound of abdomen noted we will wait for ascites.  The patient will be placed on continuous pulse oximetry and telemetry for monitoring.   Laboratory evaluation will be sent to evaluate for the above complaints.     Clinical Course as of 11/15/21 2114  Thu Nov 15, 2021  1914 Patient's hemoglobin is stable mild hyponatremia LFTs are elevated no significant bilirubin elevation.  Borderline leukocytosis platelets are 140.  Patient is EtOH positive. [PR]  1915 On my review of ultrasound I do not see significant ascites. [PR]    Clinical Course User Index [PR] Merlyn Lot, MD  Case discussed in consultation with  GI regarding plan for Protonix drip, Odrik striae tied as well as Rocephin.  Patient to be made n.p.o. for possible EGD tomorrow.  I consulted with hospitalist who has accepted patient to their service.  He remains hemodynamically stable and appropriate for admission.   FINAL CLINICAL IMPRESSION(S) / ED DIAGNOSES   Final diagnoses:  Gastrointestinal hemorrhage, unspecified gastrointestinal hemorrhage type  Alcohol abuse     Rx / DC Orders   ED Discharge Orders     None        Note:  This document was prepared using Dragon voice recognition software and may include unintentional dictation errors.    Merlyn Lot, MD 11/15/21 2114

## 2021-11-15 NOTE — ED Notes (Signed)
US at bedside

## 2021-11-15 NOTE — Assessment & Plan Note (Addendum)
-   Appears compensated at this time - Resumed home spironolactone 50 mg p.o. twice daily and furosemide 20 mg p.o. twice daily

## 2021-11-15 NOTE — Assessment & Plan Note (Signed)
-   Nicotine patch as needed for nicotine craving ordered ?

## 2021-11-15 NOTE — Hospital Course (Signed)
Mr. Omar Turner is a 51 year old male with history of alcohol abuse and dependence, incarcerated ventral hernia, compensated liver cirrhosis, history of abdominal ascites, GERD, Anxiety, who presents to the emergency department for chief concerns of alcohol detox.  He further endorses that he has had abdominal pain, vomited blood x2, and had 2 episodes of melena stool this week.  Vitals in the emergency department show temperature of 98.1, respiration rate 17, heart rate of 101, blood pressure 150/97, SPO2 of 95% on room air.  Serum sodium showed 130, potassium 3.1, chloride 92, bicarb 24, BUN of 8, serum creatinine 0.78, nonfasting blood glucose 142.  WBC was 11, hemoglobin 15.8, platelets of 141.  AST was elevated at 254, ALT of 194, T. bili is 1.4.  Alcohol level was elevated at 315.  UA was negative for nitrates and leukocytes.  UDS was negative.  In the emergency department patient was started on octreotide gtt., Protonix bolus and gtt., and patient received ceftriaxone 1 g IV dose.  GI provider was consulted and Dr. Virgina Jock has scheduled patient for EGD in the a.m.

## 2021-11-15 NOTE — Consult Note (Signed)
GI Inpatient Consult Note  Reason for Consult: Hematemesis/Melena   Attending Requesting Consult: Dr. Rupert Stacks  History of Present Illness: Omar Turner is a 51 y.o. male seen for evaluation of hematemesis and melena at the request of admitting hospitalist - Dr. Rupert Stacks. Patient has a PMH of HTN, HLD, GERD, Hx of diverticulitis, anxiety, DDD, tobacco abuse, and compensated alcoholic cirrhosis of the liver followed by [facility] (last seen 03/2021). He presented to the [facility] ED this evening for chief complaint of 2-week history of hematemesis and melena. He reports that over the past two weeks he has been having about 2-3 episodes of hematemesis and 2-3 bowel movements which were black and tarry and also some gross bright red/dark blood. He got concerned today because he started to have some upper abdominal discomfort and distention with concern for return of his abdominal ascites. He rates abdominal pain currently as 8/10 in severity. Pain is located diffusely across his abdomen but worse in upper abdomen. He describes it as bloating and pressure. He did drink 1 pint of liquor today.He has not had any episodes of hematemesis or melena in the ED since arrival. He is here with his girlfriend. He was diagnosed with alcoholic cirrhosis of the liver with ascites during hospitalization at [facility] 12/2020 where he underwent diagnostic and therapeutic paracentesis with removal of 6L of fluid with SAAG >1.1 c/w portal hypertension. He also underwent work-up for IDA with bidirectional endoscopy which showed no evidence of varices, but did show portal hypertensive gastropathy, sigmoid diverticulosis, and four subcentimeter colon polyps. He was discharged with diuretics and established care locally at the [facility]. He saw Laurine Blazer, PA-C, at the [facility] and was referred to Transplant Hepatology. He saw Dr. Donnella Bi 03/2021 at [facility]. He was then admitted 01/2021 for strangulated umbilical hernia  with SBO s/p repair. He has admitted 11/27 - 09/05/21 at [facility] for alcohol withdrawal. Previous hospital admissions for alcohol withdrawal and requesting detox 08/2019, 04/2020, and 09/2020.  Upon presentation to the ED tonight, vital signs showed mild tachycardia with HR 101 bpm, hypertensive 150/97, and O2 sats 95% on room air. Labs were significant for WBC 11K, Plt 141K, sodium 130, potassium 3.1, BUN 8, serum creatinine 0.78, hemoglobin 15.8, AST 254, ALT 194, total bilirubin 1.4, ethanol 315, and INR 1.1. Urine drug screen was negative. In the ED, he had RUQ US performed which showed no evidence of ascites. He was started on octreotide gtt, Protonix bolus and gtt, and received 1 dose of Ceftriaxone. GI consulted for further evaluation and management.    Summary of GI Procedures:  EGD 12/13/20: no varices, 2cm HH, PHG Colonoscopy 12/13/20: non-bleeding hemorrhoids, sigmoid diverticulosis, four subcentimeter polyps  Past Medical History:  Past Medical History:  Diagnosis Date   Anginal pain ([place])    Anxiety    Degenerative disc disease, cervical    Diverticulitis large intestine    GERD (gastroesophageal reflux disease)    History of chest pain    Hyperlipidemia    Hypertension    Sleep apnea    Tobacco abuse     Problem List: Patient Active Problem List   Diagnosis Date Noted   Upper GI bleed 84/13/2440   Alcoholic cirrhosis of liver with ascites ([place]) 11/15/2021   Electrolyte abnormality 11/15/2021   Incarcerated ventral hernia 01/17/2021   Other specified anemias 01/03/2021   Alcohol dependence ([place]) 01/02/2021   Portal hypertensive gastropathy ([place]) 01/02/2021   Ascites 12/11/2020   Bilateral  lower extremity edema 12/11/2020   Melena 12/11/2020   COVID-19 09/23/2020   Elevated LFTs 09/23/2020   Hypokalemia 09/23/2020   Nausea & vomiting 09/23/2020   Alcohol abuse with withdrawal ([place]) 08/09/2019   DDD (degenerative disc disease), cervical 03/20/2018   Hypertension  03/20/2018   Sleep apnea 03/20/2018   Diverticulitis of large intestine with perforation without abscess or bleeding 03/15/2018   Gastroesophageal reflux disease without esophagitis 04/03/2017   History of chest pain 04/03/2017   Other hyperlipidemia 04/03/2017   Precordial pain    Tobacco abuse 08/21/2015   Anxiety state 02/15/2015    Past Surgical History: Past Surgical History:  Procedure Laterality Date   APPENDECTOMY     CARDIAC CATHETERIZATION Left 06/17/2016   Procedure: Left Heart Cath and Coronary Angiography;  Surgeon: Wellington Hampshire, MD;  Location: [facility];  Service: Cardiovascular;  Laterality: Left;   COLONOSCOPY     COLONOSCOPY WITH PROPOFOL N/A 09/07/2015   Procedure: COLONOSCOPY WITH PROPOFOL;  Surgeon: Hulen Luster, MD;  Location: [facility] ENDOSCOPY;  Service: Gastroenterology;  Laterality: N/A;   COLONOSCOPY WITH PROPOFOL N/A 05/12/2018   Procedure: COLONOSCOPY WITH PROPOFOL;  Surgeon: Jonathon Bellows, MD;  Location: [facility] ENDOSCOPY;  Service: Gastroenterology;  Laterality: N/A;   HERNIA REPAIR     IR RADIOLOGIST EVAL & MGMT  02/01/2021   VENTRAL HERNIA REPAIR N/A 01/17/2021   Procedure: HERNIA REPAIR VENTRAL ADULT;  Surgeon: Jules Husbands, MD;  Location: [facility] ORS;  Service: General;  Laterality: N/A;    Allergies: Allergies  Allergen Reactions   Chantix [Varenicline]    Lexapro [Escitalopram]          Home Medications: (Not in a hospital admission)  Home medication reconciliation was completed with the patient.   Scheduled Inpatient Medications:    [START ON 11/26/2540] folic acid  1 mg Oral Daily   [START ON 11/16/2021] furosemide  40 mg Oral Daily   [START ON 11/16/2021] multivitamin with minerals  1 tablet Oral Daily   [START ON 11/19/2021] pantoprazole  40 mg Intravenous Q12H   [START ON 11/16/2021] spironolactone  100 mg Oral Daily   [START ON 11/16/2021] thiamine  100 mg Oral Daily    Continuous Inpatient Infusions:    sodium chloride 100 mL/hr at  11/15/21 2111   octreotide  (SANDOSTATIN)    IV infusion 50 mcg/hr (11/15/21 2128)   pantoprazole 8 mg/hr (11/15/21 2109)    PRN Inpatient Medications:  acetaminophen **OR** acetaminophen, cloNIDine, LORazepam, nicotine, ondansetron **OR** ondansetron (ZOFRAN) IV  Family History: family history includes Heart attack in his father; Heart disease in his father; Hypertension in his father.  The patient's family history is negative for inflammatory bowel disorders, GI malignancy, or solid organ transplantation.  Social History:   reports that he has been smoking. He has a 22.50 pack-year smoking history. He has never used smokeless tobacco. He reports current alcohol use of about 2.0 standard drinks per week. He reports that he does not use drugs. The patient denies ETOH, tobacco, or drug use.   Review of Systems: Constitutional: Weight is stable.  Eyes: No changes in vision. ENT: No oral lesions, sore throat.  GI: see HPI.  Heme/Lymph: No easy bruising.  CV: No chest pain.  GU: No hematuria.  Integumentary: No rashes.  Neuro: No headaches.  Psych: No depression/anxiety.  Endocrine: No heat/cold intolerance.  Allergic/Immunologic: No urticaria.  Resp: No cough, SOB.  Musculoskeletal: No joint swelling.    Physical Examination: BP (!) 150/97 (BP  Location: Left Arm)    Pulse (!) 101    Temp 98.1 F (36.7 C) (Oral)    Resp 17    Ht 5\' 11"  (1.803 m)    Wt 99.8 kg    SpO2 95%    BMI 30.68 kg/m  Non-toxic appearing male in ED stretcher. Girlfriend present. No accessory muscle use. Gen: NAD, alert and oriented x 4 HEENT: PEERLA, EOMI, Neck: supple, no JVD or thyromegaly Chest: CTA bilaterally, no wheezes, crackles, or other adventitious sounds CV: RRR, no m/g/c/r Abd: soft, mildly distended, hypoactive bowel sounds, tender to deep palpation in RUQ and epigastrium, no HSM, guarding, ridigity, or rebound tenderness Ext: no edema, well perfused with 2+ pulses, Skin: no rash or lesions  noted Lymph: no LAD  Data: Lab Results  Component Value Date   WBC 11.0 (H) 11/15/2021   HGB 15.8 11/15/2021   HCT 43.8 11/15/2021   MCV 95.4 11/15/2021   PLT 141 (L) 11/15/2021   Recent Labs  Lab 11/15/21 1756  HGB 15.8   Lab Results  Component Value Date   NA 130 (L) 11/15/2021   K 3.1 (L) 11/15/2021   CL 92 (L) 11/15/2021   CO2 24 11/15/2021   BUN 8 11/15/2021   CREATININE 0.76 11/15/2021   Lab Results  Component Value Date   ALT 194 (H) 11/15/2021   AST 254 (H) 11/15/2021   ALKPHOS 116 11/15/2021   BILITOT 1.4 (H) 11/15/2021   Recent Labs  Lab 11/15/21 1930  INR 1.1   Assessment/Plan:  51 y/o Caucasian male with a PMH of HTN, HLD, GERD, Hx of diverticulitis, anxiety, DDD, tobacco abuse, and compensated alcoholic cirrhosis of the liver followed by [facility] (last seen 03/2021) with ongoing EtOH abuse presented to the [facility] ED this evening for chief complaint of 2-week history of hematemesis and melena. GI consulted for further evaluation and management.   Compensated alcohol cirrhosis of the liver - MELD-Na 17, Child-Pugh Class A (5 pts).   Hematemesis/Melena - hemodynamically stable with hemoglobin 15.8, he reports 2-week history of daily hematemesis and melena which is concerning for upper GI bleed. Differential diagnosis includes peptic ulcer disease, gastritis, erosive esophagitis, Mallory-Weiss tear, variceal bleed, AVMs, malignancy, Dieulafoy's lesion, polyp, etc.   Alcohol abuse with hx of multiple admissions for withdrawal - Ethanol level 315 on admission. CIWA protocol.   Electrolyte derangement - IVF hydration for repletion   Elevated LFTs - 2/2 ongoing EtOH abuse in setting of cirrhosis   Recommendations:  - Maintain 2 large bore IVs for access - Continue to monitor serial H&H with goal hemoglobin >8.0.  - Currently hemodynamically stable with no evidence of overt GI hemorrhage - Continue serial abdominal examinations - CIWA protocol. Ativan  2 mg IV as needed.  - Continue octreotide gtt for total of 72 hours. Continue Protonix gtt.  - Replete electrolytes as needed - RUQ [place] tonight with no evidence of ascites. Home dose diuretics can be continued at this time. Paracentesis not indicated.  - Discussed with patient that strict alcohol cessation is strongly advised to prevent further hepatic decompensation. He is open to detox and wants to quit drinking. Hopefully decisions can be made this hospitalization to assist him.  - Advise EGD for endoluminal evaluation. Discussed procedure details and indications tonight in the ED with patient and wife. He consents to proceed.  - Plan for EGD tomorrow with Dr. Virgina Jock - Clear liquid diet for now. NPO after midnight.  - See procedure note for findings  and further recommendations  I reviewed the risks (including bleeding, perforation, infection, anesthesia complications, cardiac/respiratory complications), benefits and alternatives of EGD. Patient consents to proceed.    Thank you for the consult. Please call with questions or concerns.  Reeves Forth [facility] [phone] (Cell)

## 2021-11-15 NOTE — Assessment & Plan Note (Addendum)
-   Presumed secondary to alcohol abuse and dependence with chronic use - EtOH elevated at 315 on admission - Clear liquids now, n.p.o. after midnight - Current hemoglobin is 15.8, presumed secondary to some contraction in setting of dehydration - Hemoglobin goal of greater than 8 - Epic order placed for gastroenterology consultation to Dr. Virgina Jock placed

## 2021-11-15 NOTE — Assessment & Plan Note (Signed)
-   Unlikely the patient will withdrawal in the next 24 hours as patient had EtOH prior to presenting to the emergency department - Ethanol level on admission was 315 - Ativan 2 mg IV as needed, 3 doses ordered with instructions to administer and then let provider know

## 2021-11-15 NOTE — ED Triage Notes (Signed)
Pt comes into the ED via POV c/o abd pain and detox from alcohol.  Pt states the last use of ETOH was 3 hours ago.  Pt states he drink a 5th per day.  Pt also explains he underwent a hernia repair 6 months ago and now he is having swelling and pain around the area.  PT in NAD at this time with even and unlabored respirations. PT is having N/V/D.

## 2021-11-16 ENCOUNTER — Encounter: Payer: Self-pay | Admitting: Internal Medicine

## 2021-11-16 ENCOUNTER — Observation Stay: Payer: BC Managed Care – PPO | Admitting: Registered Nurse

## 2021-11-16 ENCOUNTER — Encounter
Admission: EM | Disposition: A | Discharge status: Home / Self Care | Payer: Self-pay | Source: Home / Self Care | Attending: Student in an Organized Health Care Education/Training Program

## 2021-11-16 ENCOUNTER — Observation Stay: Admit: 2021-11-16 | Payer: BC Managed Care – PPO

## 2021-11-16 DIAGNOSIS — E669 Obesity, unspecified: Secondary | ICD-10-CM | POA: Diagnosis present

## 2021-11-16 DIAGNOSIS — Z20822 Contact with and (suspected) exposure to covid-19: Secondary | ICD-10-CM | POA: Diagnosis present

## 2021-11-16 DIAGNOSIS — E876 Hypokalemia: Secondary | ICD-10-CM | POA: Diagnosis present

## 2021-11-16 DIAGNOSIS — K2970 Gastritis, unspecified, without bleeding: Secondary | ICD-10-CM | POA: Insufficient documentation

## 2021-11-16 DIAGNOSIS — Z888 Allergy status to other drugs, medicaments and biological substances status: Secondary | ICD-10-CM | POA: Diagnosis not present

## 2021-11-16 DIAGNOSIS — R0602 Shortness of breath: Secondary | ICD-10-CM | POA: Diagnosis present

## 2021-11-16 DIAGNOSIS — R0609 Other forms of dyspnea: Secondary | ICD-10-CM | POA: Diagnosis not present

## 2021-11-16 DIAGNOSIS — K2921 Alcoholic gastritis with bleeding: Secondary | ICD-10-CM | POA: Diagnosis not present

## 2021-11-16 DIAGNOSIS — F17210 Nicotine dependence, cigarettes, uncomplicated: Secondary | ICD-10-CM | POA: Diagnosis present

## 2021-11-16 DIAGNOSIS — I10 Essential (primary) hypertension: Secondary | ICD-10-CM | POA: Diagnosis not present

## 2021-11-16 DIAGNOSIS — E860 Dehydration: Secondary | ICD-10-CM | POA: Diagnosis present

## 2021-11-16 DIAGNOSIS — Z79899 Other long term (current) drug therapy: Secondary | ICD-10-CM | POA: Diagnosis not present

## 2021-11-16 DIAGNOSIS — F10220 Alcohol dependence with intoxication, uncomplicated: Secondary | ICD-10-CM | POA: Diagnosis not present

## 2021-11-16 DIAGNOSIS — K7031 Alcoholic cirrhosis of liver with ascites: Secondary | ICD-10-CM | POA: Diagnosis present

## 2021-11-16 DIAGNOSIS — G4730 Sleep apnea, unspecified: Secondary | ICD-10-CM | POA: Diagnosis present

## 2021-11-16 DIAGNOSIS — E878 Other disorders of electrolyte and fluid balance, not elsewhere classified: Secondary | ICD-10-CM | POA: Diagnosis present

## 2021-11-16 DIAGNOSIS — Z8249 Family history of ischemic heart disease and other diseases of the circulatory system: Secondary | ICD-10-CM | POA: Diagnosis not present

## 2021-11-16 DIAGNOSIS — U071 COVID-19: Secondary | ICD-10-CM | POA: Diagnosis present

## 2021-11-16 DIAGNOSIS — Y908 Blood alcohol level of 240 mg/100 ml or more: Secondary | ICD-10-CM | POA: Diagnosis present

## 2021-11-16 DIAGNOSIS — E7849 Other hyperlipidemia: Secondary | ICD-10-CM | POA: Diagnosis present

## 2021-11-16 DIAGNOSIS — F411 Generalized anxiety disorder: Secondary | ICD-10-CM | POA: Diagnosis present

## 2021-11-16 DIAGNOSIS — D6489 Other specified anemias: Secondary | ICD-10-CM | POA: Diagnosis present

## 2021-11-16 DIAGNOSIS — K2971 Gastritis, unspecified, with bleeding: Secondary | ICD-10-CM | POA: Diagnosis present

## 2021-11-16 DIAGNOSIS — K3189 Other diseases of stomach and duodenum: Secondary | ICD-10-CM | POA: Diagnosis present

## 2021-11-16 DIAGNOSIS — R7989 Other specified abnormal findings of blood chemistry: Secondary | ICD-10-CM | POA: Diagnosis present

## 2021-11-16 DIAGNOSIS — R55 Syncope and collapse: Secondary | ICD-10-CM | POA: Diagnosis present

## 2021-11-16 DIAGNOSIS — K7011 Alcoholic hepatitis with ascites: Secondary | ICD-10-CM | POA: Diagnosis present

## 2021-11-16 DIAGNOSIS — K922 Gastrointestinal hemorrhage, unspecified: Secondary | ICD-10-CM | POA: Diagnosis not present

## 2021-11-16 DIAGNOSIS — K2100 Gastro-esophageal reflux disease with esophagitis, without bleeding: Secondary | ICD-10-CM | POA: Diagnosis present

## 2021-11-16 DIAGNOSIS — E871 Hypo-osmolality and hyponatremia: Secondary | ICD-10-CM | POA: Diagnosis present

## 2021-11-16 DIAGNOSIS — K766 Portal hypertension: Secondary | ICD-10-CM | POA: Diagnosis present

## 2021-11-16 HISTORY — PX: ESOPHAGOGASTRODUODENOSCOPY (EGD) WITH PROPOFOL: SHX5813

## 2021-11-16 LAB — CBC
HCT: 39.8 % (ref 39.0–52.0)
Hemoglobin: 14.1 g/dL (ref 13.0–17.0)
MCH: 34.5 pg — ABNORMAL HIGH (ref 26.0–34.0)
MCHC: 35.4 g/dL (ref 30.0–36.0)
MCV: 97.3 fL (ref 80.0–100.0)
Platelets: 95 10*3/uL — ABNORMAL LOW (ref 150–400)
RBC: 4.09 MIL/uL — ABNORMAL LOW (ref 4.22–5.81)
RDW: 14.3 % (ref 11.5–15.5)
WBC: 7.2 10*3/uL (ref 4.0–10.5)
nRBC: 0 % (ref 0.0–0.2)

## 2021-11-16 LAB — COMPREHENSIVE METABOLIC PANEL
ALT: 181 U/L — ABNORMAL HIGH (ref 0–44)
AST: 256 U/L — ABNORMAL HIGH (ref 15–41)
Albumin: 3.7 g/dL (ref 3.5–5.0)
Alkaline Phosphatase: 100 U/L (ref 38–126)
Anion gap: 10 (ref 5–15)
BUN: 8 mg/dL (ref 6–20)
CO2: 25 mmol/L (ref 22–32)
Calcium: 7.8 mg/dL — ABNORMAL LOW (ref 8.9–10.3)
Chloride: 98 mmol/L (ref 98–111)
Creatinine, Ser: 0.66 mg/dL (ref 0.61–1.24)
GFR, Estimated: >60 mL/min (ref >60)
Glucose, Bld: 135 mg/dL — ABNORMAL HIGH (ref 70–99)
Potassium: 3.7 mmol/L (ref 3.5–5.1)
Sodium: 133 mmol/L — ABNORMAL LOW (ref 135–145)
Total Bilirubin: 2.4 mg/dL — ABNORMAL HIGH (ref 0.3–1.2)
Total Protein: 7 g/dL (ref 6.5–8.1)

## 2021-11-16 LAB — SODIUM, URINE, RANDOM: Sodium, Ur: 34 mmol/L

## 2021-11-16 LAB — HIV ANTIBODY (ROUTINE TESTING W REFLEX): HIV Screen 4th Generation wRfx: NONREACTIVE

## 2021-11-16 LAB — OSMOLALITY, URINE: Osmolality, Ur: 626 mOsm/kg (ref 300–900)

## 2021-11-16 LAB — OSMOLALITY: Osmolality: 283 mOsm/kg (ref 275–295)

## 2021-11-16 SURGERY — ESOPHAGOGASTRODUODENOSCOPY (EGD) WITH PROPOFOL
Anesthesia: General

## 2021-11-16 MED ORDER — PROPOFOL 10 MG/ML IV BOLUS
INTRAVENOUS | Status: DC | PRN
  Administered 2021-11-16: 100 mg via INTRAVENOUS

## 2021-11-16 MED ORDER — ACETAMINOPHEN 500 MG PO TABS
ORAL_TABLET | ORAL | Status: AC
  Administered 2021-11-16: 500 mg via ORAL
  Filled 2021-11-16: qty 1

## 2021-11-16 MED ORDER — THIAMINE HCL 100 MG/ML IJ SOLN: 100.0000 mg | Freq: Every day | INTRAMUSCULAR | Status: DC

## 2021-11-16 MED ORDER — FOLIC ACID 1 MG PO TABS: 1.0000 mg | ORAL_TABLET | Freq: Every day | ORAL | Status: DC

## 2021-11-16 MED ORDER — LIDOCAINE HCL (CARDIAC) PF 100 MG/5ML IV SOSY
PREFILLED_SYRINGE | INTRAVENOUS | Status: DC | PRN
Start: 2021-11-16 — End: 2021-11-16
  Administered 2021-11-16: 100 mg via INTRAVENOUS

## 2021-11-16 MED ORDER — DEXMEDETOMIDINE HCL 200 MCG/2ML IV SOLN
INTRAVENOUS | Status: DC | PRN
  Administered 2021-11-16: 12 ug via INTRAVENOUS
  Administered 2021-11-16: 8 ug via INTRAVENOUS

## 2021-11-16 MED ORDER — MIDAZOLAM HCL 2 MG/2ML IJ SOLN
INTRAMUSCULAR | Status: AC
  Filled 2021-11-16: qty 2

## 2021-11-16 MED ORDER — MIDAZOLAM HCL 2 MG/2ML IJ SOLN
INTRAMUSCULAR | Status: DC | PRN
Start: 2021-11-16 — End: 2021-11-16
  Administered 2021-11-16: 2 mg via INTRAVENOUS

## 2021-11-16 MED ORDER — LORAZEPAM 2 MG/ML IJ SOLN
1.0000 mg | INTRAMUSCULAR | Status: DC | PRN
  Administered 2021-11-16: 1 mg via INTRAVENOUS
  Filled 2021-11-16: qty 1

## 2021-11-16 MED ORDER — SODIUM CHLORIDE 0.9 % IV SOLN: INTRAVENOUS | Status: DC

## 2021-11-16 MED ORDER — ACETAMINOPHEN 500 MG PO TABS: 500.0000 mg | ORAL_TABLET | Freq: Once | ORAL | Status: AC

## 2021-11-16 MED ORDER — LORAZEPAM 1 MG PO TABS
1.0000 mg | ORAL_TABLET | ORAL | Status: DC | PRN
  Administered 2021-11-17: 1 mg via ORAL
  Filled 2021-11-16: qty 1

## 2021-11-16 MED ORDER — ACETAMINOPHEN 325 MG PO TABS
650.0000 mg | ORAL_TABLET | Freq: Once | ORAL | Status: DC
Start: 2021-11-16 — End: 2021-11-16

## 2021-11-16 MED ORDER — PROPOFOL 500 MG/50ML IV EMUL
INTRAVENOUS | Status: DC | PRN
  Administered 2021-11-16: 150 ug/kg/min via INTRAVENOUS

## 2021-11-16 MED ORDER — ADULT MULTIVITAMIN W/MINERALS CH: 1.0000 | ORAL_TABLET | Freq: Every day | ORAL | Status: DC

## 2021-11-16 MED ORDER — THIAMINE HCL 100 MG PO TABS: 100.0000 mg | ORAL_TABLET | Freq: Every day | ORAL | Status: DC

## 2021-11-16 MED ORDER — ACETAMINOPHEN 500 MG PO TABS
ORAL_TABLET | ORAL | Status: AC
  Filled 2021-11-16: qty 1

## 2021-11-16 NOTE — Op Note (Signed)
[facility]astroenterology Patient Name: Omar Turner Procedure Date: 11/16/2021 11:09 AM MRN: [id number] Account #: [id number] Date of Birth: 11-Jan-1971 Admit Type: Inpatient Age: 51 Room: [facility] ENDO ROOM 1 Gender: Male Note Status: Finalized Instrument Name: Michaelle Birks [id number] Procedure:             Upper GI endoscopy Indications:           Hematemesis Providers:             Rueben Bash, DO Referring MD:          Ramonita Lab, MD (Referring MD) Medicines:             Monitored Anesthesia Care Complications:         No immediate complications. Estimated blood loss:                         Minimal. Procedure:             Pre-Anesthesia Assessment:                        - Prior to the procedure, a History and Physical was                         performed, and patient medications and allergies were                         reviewed. The patient is competent. The risks and                         benefits of the procedure and the sedation options and                         risks were discussed with the patient. All questions                         were answered and informed consent was obtained.                         Patient identification and proposed procedure were                         verified by the physician, the nurse, the anesthetist                         and the technician in the endoscopy suite. Mental                         Status Examination: alert and oriented. Airway                         Examination: normal oropharyngeal airway and neck                         mobility. Respiratory Examination: clear to                         auscultation. CV Examination: RRR, no murmurs, no S3  or S4. Prophylactic Antibiotics: The patient requires                         prophylactic antibiotics due to a prior history of                         cirrhosis. Prior Anticoagulants: The patient has taken                          no previous anticoagulant or antiplatelet agents. ASA                         Grade Assessment: III - A patient with severe systemic                         disease. After reviewing the risks and benefits, the                         patient was deemed in satisfactory condition to                         undergo the procedure. The anesthesia plan was to use                         monitored anesthesia care (MAC). Immediately prior to                         administration of medications, the patient was                         re-assessed for adequacy to receive sedatives. The                         heart rate, respiratory rate, oxygen saturations,                         blood pressure, adequacy of pulmonary ventilation, and                         response to care were monitored throughout the                         procedure. The physical status of the patient was                         re-assessed after the procedure.                        After obtaining informed consent, the endoscope was                         passed under direct vision. Throughout the procedure,                         the patient's blood pressure, pulse, and oxygen                         saturations were monitored continuously. The Endoscope  was introduced through the mouth, and advanced to the                         second part of duodenum. The upper GI endoscopy was                         accomplished without difficulty. The patient tolerated                         the procedure well. Findings:      The duodenal bulb, first portion of the duodenum and second portion of       the duodenum were normal. Estimated blood loss: none.      Mild portal hypertensive gastropathy was found in the entire examined       stomach. no active oozing, but some old blood/clot noted and washed       away. no GAVE or gastric varices Estimated blood loss: none.      Localized mild inflammation  characterized by erythema was found in the       gastric antrum. Estimated blood loss: none.      LA Grade A (one or more mucosal breaks less than 5 mm, not extending       between tops of 2 mucosal folds) esophagitis with no bleeding was found.       Estimated blood loss: none. No esophageal varices noted.      The exam of the esophagus was otherwise normal.      The Z-line was regular. Impression:            - Normal duodenal bulb, first portion of the duodenum                         and second portion of the duodenum.                        - Portal hypertensive gastropathy.                        - Gastritis.                        - LA Grade A esophagitis with no bleeding.                        - Z-line regular.                        - No specimens collected. Recommendation:        - Return patient to hospital ward for ongoing care.                        - Low sodium diet.                        - Discontinue octreotide and protonix gtt. Can                         transition to twice daily protonix 40 mg po. Continue                         total course  7 days -if discharged prior to                         completion- treat with cipro 500 mg twice daily to                         complete course.                        Recommend complete alcohol cessation                        CIWA protocol and electolyte correction as per primary                         team.                        GI to sign off. Available as needed                        - Return to Hepatology clinic as previously scheduled.                        - The findings and recommendations were discussed with                         the patient. Procedure Code(s):     --- Professional ---                        [phone], Esophagogastroduodenoscopy, flexible,                         transoral; diagnostic, including collection of                         specimen(s) by brushing or washing, when performed                          (separate procedure) Diagnosis Code(s):     --- Professional ---                        K76.6, Portal hypertension                        K31.89, Other diseases of stomach and duodenum                        K29.70, Gastritis, unspecified, without bleeding                        K20.90, Esophagitis, unspecified without bleeding                        K92.0, Hematemesis CPT copyright 2019 American Medical Association. All rights reserved. The codes documented in this report are preliminary and upon coder review may  be revised to meet current compliance requirements. Attending Participation:      I personally performed the entire procedure. Volney American, DO Annamaria Helling DO, DO 11/16/2021 12:14:29 PM This report has been signed electronically. Number of Addenda: 0 Note Initiated  On: 11/16/2021 11:09 AM Estimated Blood Loss:  Estimated blood loss was minimal.      [facility]

## 2021-11-16 NOTE — Assessment & Plan Note (Addendum)
Patient does not have altered mental status.  Follow-up with GI as outpatient

## 2021-11-16 NOTE — Anesthesia Preprocedure Evaluation (Addendum)
Anesthesia Evaluation  Patient identified by MRN, date of birth, ID band Patient awake    Reviewed: Allergy & Precautions, NPO status , Patient's Chart, lab work & pertinent test results  History of Anesthesia Complications Negative for: history of anesthetic complications  Airway Mallampati: III   Neck ROM: Full    Dental no notable dental hx.    Pulmonary sleep apnea , COPD, Current Smoker (2 ppd) and Patient abstained from smoking.,  COVID+ 11/15/21, but pt reports positive for COVID 2 weeks ago; asymptomatic   Pulmonary exam normal breath sounds clear to auscultation       Cardiovascular hypertension, Normal cardiovascular exam Rhythm:Regular Rate:Normal  ECG 11/15/21:  Sinus tachycardia (HR 102) Borderline T wave abnormalities Borderline prolonged QT interval   Neuro/Psych PSYCHIATRIC DISORDERS Anxiety Alcohol use disorder, fifth of alcohol daily, last intake 11/15/21, on CIWA protocol    GI/Hepatic GERD  ,(+) Cirrhosis   ascites    , NAFLD GI bleed   Endo/Other  Obesity   Renal/GU      Musculoskeletal   Abdominal   Peds  Hematology negative hematology ROS (+)   Anesthesia Other Findings   Reproductive/Obstetrics                            Anesthesia Physical Anesthesia Plan  ASA: 4  Anesthesia Plan: General   Post-op Pain Management:    Induction: Intravenous  PONV Risk Score and Plan: 1 and Propofol infusion, TIVA and Treatment may vary due to age or medical condition  Airway Management Planned: Natural Airway  Additional Equipment:   Intra-op Plan:   Post-operative Plan:   Informed Consent: I have reviewed the patients History and Physical, chart, labs and discussed the procedure including the risks, benefits and alternatives for the proposed anesthesia with the patient or authorized representative who has indicated his/her understanding and acceptance.        Plan Discussed with: CRNA  Anesthesia Plan Comments: (LMA/GETA backup discussed.  Patient consented for risks of anesthesia including but not limited to:  - adverse reactions to medications - damage to eyes, teeth, lips or other oral mucosa - nerve damage due to positioning  - sore throat or hoarseness - damage to heart, brain, nerves, lungs, other parts of body or loss of life  Informed patient about role of CRNA in peri- and intra-operative care.  Patient voiced understanding.)        Anesthesia Quick Evaluation

## 2021-11-16 NOTE — Hospital Course (Addendum)
Mr. Kutch is a 51 year old male with history of alcohol abuse and dependence, incarcerated ventral hernia, compensated liver cirrhosis, history of abdominal ascites, GERD, Anxiety, who presents to the emergency department for chief concerns of alcohol detox.  He further endorses that he has had abdominal pain, vomited blood x2, and had 2 episodes of melena stool this week. EGD was performed, showed gastritis, esophagitis and portal hypertension gastropathy. Patient was monitored for another day, he still has no evidence of alcohol withdrawal.  No additional black stools.  Hemoglobin stable.  At this point he is medically stable to be discharged, will prescribe Librium taper.

## 2021-11-16 NOTE — Assessment & Plan Note (Addendum)
Improved

## 2021-11-16 NOTE — Assessment & Plan Note (Addendum)
Patient still has no evidence of alcohol withdrawal.  Will prescribe Librium taper at time of discharge.  Advised to quit alcohol: Specifically asked patient to not use benzodiazepines together with alcohol.

## 2021-11-16 NOTE — Interval H&P Note (Signed)
History and Physical Interval Note: Preprocedure H&P from 11/16/21  was reviewed and there was no interval change after seeing and examining the patient.  Written consent was obtained from the patient after discussion of risks, benefits, and alternatives. Patient has consented to proceed with Esophagogastroduodenoscopy with possible intervention   11/16/2021 11:30 AM  Omar Turner  has presented today for surgery, with the diagnosis of hematemesis, melena, cirrhosis. No h/o esophageal varices (most recent egd 2022).  The various methods of treatment have been discussed with the patient and family. After consideration of risks, benefits and other options for treatment, the patient has consented to  Procedure(s): ESOPHAGOGASTRODUODENOSCOPY (EGD) WITH PROPOFOL (N/A) as a surgical intervention.  The patient's history has been reviewed, patient examined, no change in status, stable for surgery.  I have reviewed the patient's chart and labs.  Questions were answered to the patient's satisfaction.     Annamaria Helling

## 2021-11-16 NOTE — Progress Notes (Addendum)
°  Progress Note   Patient: Omar Turner LHT:[id number] DOB: [birth year] DOA: 11/15/2021     0 DOS: the patient was seen and examined on 11/16/2021   Brief hospital course: Mr. Hallums is a 51 year old male with history of alcohol abuse and dependence, incarcerated ventral hernia, compensated liver cirrhosis, history of abdominal ascites, GERD, Anxiety, who presents to the emergency department for chief concerns of alcohol detox.  He further endorses that he has had abdominal pain, vomited blood x2, and had 2 episodes of melena stool this week. EGD was performed, showed gastritis, esophagitis and portal hypertension gastropathy.  Assessment and Plan: Gastritis without bleeding Patient still had some black stool yesterday.  EGD did not show additional active bleeding.  Hemoglobin has been stable.  Continue PPI.  Alcoholic cirrhosis of liver with ascites ([place])- (present on admission) Continue to follow.  Patient does not have altered mental status.  Hypokalemia- (present on admission) Repleted, recheck BMP tomorrow.  Alcohol dependence ([place])- (present on admission) Continue CIWA protocol, thiamine and folic acid.  Watch for withdrawal.  Hypertension- (present on admission) Continue some home medicines.   COVID infection. Patient first COVID was positive 3 weeks ago, currently he does not have respiratory symptoms.  No need for additional treatment.   Subjective: Patient doing well today, status post EGD.  No abdominal pain nausea vomiting. No confusion pain No short of breath today.  Physical Exam: Vitals:   11/16/21 1235 11/16/21 1243 11/16/21 1245 11/16/21 1451  BP: 117/89  (!) 123/92 (!) 142/89  Pulse: 83 84 87 84  Resp:    17  Temp:    98.4 F (36.9 C)  TempSrc:      SpO2: 95% 96% 96% 98%  Weight:      Height:       General exam: Appears calm and comfortable  Respiratory system: Clear to auscultation. Respiratory effort normal. Cardiovascular system: S1 & S2 heard,  RRR. No JVD, murmurs, rubs, gallops or clicks. No pedal edema. Gastrointestinal system: Abdomen is nondistended, soft and nontender. No organomegaly or masses felt. Normal bowel sounds heard. Central nervous system: Alert and oriented. No focal neurological deficits. Extremities: Symmetric 5 x 5 power. Skin: No rashes, lesions or ulcers Psychiatry: Judgement and insight appear normal. Mood & affect appropriate.    Data Reviewed: Reviewed CBC, BMP and liver function panel, abdominal ultrasound.  Family Communication:   Disposition: Status is: Observation The patient will require care spanning > 2 midnights and should be moved to inpatient because: Severity of disease, risk of alcohol withdrawal.        Planned Discharge Destination: Home   Time spent: 27 minutes  Author: Sharen Hones, MD 11/16/2021 3:12 PM  For on call review [email or web address].

## 2021-11-16 NOTE — Transfer of Care (Signed)
Immediate Anesthesia Transfer of Care Note  Patient: Omar Turner  Procedure(s) Performed: ESOPHAGOGASTRODUODENOSCOPY (EGD) WITH PROPOFOL  Patient Location: Endoscopy Unit  Anesthesia Type:General  Level of Consciousness: drowsy  Airway & Oxygen Therapy: Patient Spontanous Breathing and Patient connected to face mask oxygen  Post-op Assessment: Report given to RN and Post -op Vital signs reviewed and stable  Post vital signs: Reviewed and stable  Last Vitals:  Vitals Value Taken Time  BP 122/82 11/16/21 1205  Temp    Pulse 90 11/16/21 1205  Resp 17 11/16/21 1205  SpO2 100 % 11/16/21 1205    Last Pain:  Vitals:   11/16/21 1205  TempSrc:   PainSc: Asleep         Complications: No notable events documented.

## 2021-11-16 NOTE — Progress Notes (Signed)
Initial Nutrition Assessment  DOCUMENTATION CODES:   Obesity unspecified  INTERVENTION:   -RD will follow for diet advancement and add supplements as appropriate  NUTRITION DIAGNOSIS:   Inadequate oral intake related to altered GI function as evidenced by NPO status.  GOAL:   Patient will meet greater than or equal to 90% of their needs  MONITOR:   PO intake, Supplement acceptance, Diet advancement, Labs, Weight trends, Skin, I & O's  REASON FOR ASSESSMENT:   Malnutrition Screening Tool    ASSESSMENT:   Omar Turner is a 51 year old male with history of alcohol abuse and dependence, incarcerated ventral hernia, compensated liver cirrhosis, history of abdominal ascites, GERD, Anxiety, who presents to the emergency department for chief concerns of alcohol detox.  Pt admitted with alcohol detox and upper GIB.   Reviewed I/O's: -108 ml x 24 hours  UOP: 200 ml x 24 hours  Pt down in endoscopy suite at time of visit for EGD.RD unable to obtain further nutrition-related history or complete nutrition-focused physical exam at this time.    Pt currently NPO for procedure.   Reviewed wt hx; wt has been stable over the past year.   Pt is at high nutritional risk given ETOH abuse and history of cirrhosis. Pt would greatly benefit from addition of oral nutrition supplements once diet is advanced.   Medications reviewed and include folic acid, lasix, thiamine, vitamin B-1, aldactone, and 0.9% sodium chloride infusion @ 100 ml/hr.  Labs reviewed: Na: 133.    Diet Order:   Diet Order             Diet NPO time specified Except for: Sips with Meds  Diet effective midnight                   EDUCATION NEEDS:   No education needs have been identified at this time  Skin:  Skin Assessment: Reviewed RN Assessment  Last BM:  Unknown  Height:   Ht Readings from Last 1 Encounters:  11/16/21 5\' 11"  (1.803 m)    Weight:   Wt Readings from Last 1 Encounters:  11/16/21  99.8 kg    Ideal Body Weight:  78.2 kg  BMI:  Body mass index is 30.68 kg/m.  Estimated Nutritional Needs:   Kcal:  2150-2350  Protein:  115-130 grams  Fluid:  > 2 L    Loistine Chance, RD, LDN, [place] Registered Dietitian II Certified Diabetes Care and Education Specialist Please refer to [facility] for RD and/or RD on-call/weekend/after hours pager

## 2021-11-16 NOTE — ED Notes (Signed)
Pt awake and resting. States that he feels less anxious and nausea has improved. States that he feels more sleepy. No new needs at this time.

## 2021-11-16 NOTE — Anesthesia Postprocedure Evaluation (Signed)
Anesthesia Post Note  Patient: Omar Turner  Procedure(s) Performed: ESOPHAGOGASTRODUODENOSCOPY (EGD) WITH PROPOFOL  Patient location during evaluation: PACU Anesthesia Type: General Level of consciousness: awake and alert, oriented and patient cooperative Pain management: pain level controlled Vital Signs Assessment: post-procedure vital signs reviewed and stable Respiratory status: spontaneous breathing, nonlabored ventilation and respiratory function stable Cardiovascular status: blood pressure returned to baseline and stable Postop Assessment: adequate PO intake Anesthetic complications: no   No notable events documented.   Last Vitals:  Vitals:   11/16/21 1243 11/16/21 1245  BP:  (!) 123/92  Pulse: 84 87  Resp:    Temp:    SpO2: 96% 96%    Last Pain:  Vitals:   11/16/21 1245  TempSrc:   PainSc: 0-No pain                 Darrin Nipper

## 2021-11-16 NOTE — Assessment & Plan Note (Addendum)
Resume home treatment.

## 2021-11-16 NOTE — Assessment & Plan Note (Signed)
-   Intermittent Shortness of breath with exertion and lower extremity swelling - Query nonischemic cardiomyopathy - BNP, high sensitive troponin ordered - Complete echo ordered

## 2021-11-16 NOTE — Assessment & Plan Note (Signed)
Patient still had some black stool yesterday.  EGD did not show additional active bleeding.  Hemoglobin has been stable.  Continue PPI.

## 2021-11-16 NOTE — ED Notes (Signed)
Pt states that he is feeling anxious, hot, nauseated, and that his heart is pounding. Sinus Tach HR 101. Laying calmly in bed. CIWA score 8. Ativan given for anxiety per MAR. RA.

## 2021-11-17 ENCOUNTER — Inpatient Hospital Stay (HOSPITAL_COMMUNITY)
Admit: 2021-11-17 | Discharge: 2021-11-17 | Disposition: A | Discharge status: Home / Self Care | Payer: BC Managed Care – PPO | Attending: Internal Medicine | Admitting: Internal Medicine

## 2021-11-17 DIAGNOSIS — K7031 Alcoholic cirrhosis of liver with ascites: Secondary | ICD-10-CM

## 2021-11-17 DIAGNOSIS — U071 COVID-19: Secondary | ICD-10-CM

## 2021-11-17 DIAGNOSIS — R0609 Other forms of dyspnea: Secondary | ICD-10-CM

## 2021-11-17 DIAGNOSIS — K2921 Alcoholic gastritis with bleeding: Secondary | ICD-10-CM | POA: Diagnosis not present

## 2021-11-17 DIAGNOSIS — K922 Gastrointestinal hemorrhage, unspecified: Secondary | ICD-10-CM | POA: Diagnosis not present

## 2021-11-17 DIAGNOSIS — K2971 Gastritis, unspecified, with bleeding: Secondary | ICD-10-CM

## 2021-11-17 LAB — CBC
HCT: 39.4 % (ref 39.0–52.0)
Hemoglobin: 13.6 g/dL (ref 13.0–17.0)
MCH: 33.4 pg (ref 26.0–34.0)
MCHC: 34.5 g/dL (ref 30.0–36.0)
MCV: 96.8 fL (ref 80.0–100.0)
Platelets: 75 10*3/uL — ABNORMAL LOW (ref 150–400)
RBC: 4.07 MIL/uL — ABNORMAL LOW (ref 4.22–5.81)
RDW: 13.6 % (ref 11.5–15.5)
WBC: 4.6 10*3/uL (ref 4.0–10.5)
nRBC: 0 % (ref 0.0–0.2)

## 2021-11-17 LAB — BASIC METABOLIC PANEL
Anion gap: 8 (ref 5–15)
BUN: 7 mg/dL (ref 6–20)
CO2: 23 mmol/L (ref 22–32)
Calcium: 7.7 mg/dL — ABNORMAL LOW (ref 8.9–10.3)
Chloride: 104 mmol/L (ref 98–111)
Creatinine, Ser: 0.58 mg/dL — ABNORMAL LOW (ref 0.61–1.24)
GFR, Estimated: >60 mL/min (ref >60)
Glucose, Bld: 129 mg/dL — ABNORMAL HIGH (ref 70–99)
Potassium: 3.8 mmol/L (ref 3.5–5.1)
Sodium: 135 mmol/L (ref 135–145)

## 2021-11-17 LAB — ECHOCARDIOGRAM COMPLETE
AR max vel: 2.87 cm2
AV Peak grad: 5.7 mmHg
Ao pk vel: 1.19 m/s
Area-P 1/2: 4.8 cm2
Calc EF: 57.9 %
Height: 71 in
S' Lateral: 3.61 cm
Single Plane A2C EF: 56.7 %
Single Plane A4C EF: 57.6 %
Weight: 3520 oz

## 2021-11-17 LAB — MAGNESIUM: Magnesium: 2 mg/dL (ref 1.7–2.4)

## 2021-11-17 MED ORDER — PANTOPRAZOLE SODIUM 40 MG PO TBEC: 40.0000 mg | DELAYED_RELEASE_TABLET | Freq: Two times a day (BID) | ORAL | 1 refills | Status: DC

## 2021-11-17 MED ORDER — CHLORDIAZEPOXIDE HCL 10 MG PO CAPS: ORAL_CAPSULE | ORAL | 0 refills | Status: AC

## 2021-11-17 NOTE — Progress Notes (Signed)
*  PRELIMINARY RESULTS* Echocardiogram 2D Echocardiogram has been performed.  Omar Turner 11/17/2021, 8:23 AM

## 2021-11-17 NOTE — Assessment & Plan Note (Signed)
COVID  was diagnosed about 2 weeks ago, no need for treatment.

## 2021-11-17 NOTE — Discharge Summary (Signed)
Physician Discharge Summary   Patient: Omar Turner MRN: [id number] DOB: 02-16-1971  Admit date:     11/15/2021  Discharge date: 11/17/21  Discharge Physician: Sharen Hones   PCP: Adin Hector, MD   Recommendations at discharge:    Discharge Diagnoses: Active Problems:   Anxiety state   Gastroesophageal reflux disease without esophagitis   Hypertension   Other hyperlipidemia   Sleep apnea   Tobacco abuse   Alcohol abuse with withdrawal (HCC)   Alcohol dependence ([place])   COVID-19 virus infection   Elevated LFTs   Hypokalemia   Melena   Nausea & vomiting   Portal hypertensive gastropathy (HCC)   Alcoholic cirrhosis of liver with ascites (HCC)   Electrolyte abnormality   Shortness of breath   Acute upper GI bleed   Gastritis with bleeding  Principal Problem (Resolved):   Upper GI bleed   Hospital Course: Mr. Finken is a 51 year old male with history of alcohol abuse and dependence, incarcerated ventral hernia, compensated liver cirrhosis, history of abdominal ascites, GERD, Anxiety, who presents to the emergency department for chief concerns of alcohol detox.  He further endorses that he has had abdominal pain, vomited blood x2, and had 2 episodes of melena stool this week. EGD was performed, showed gastritis, esophagitis and portal hypertension gastropathy. Patient was monitored for another day, he still has no evidence of alcohol withdrawal.  No additional black stools.  Hemoglobin stable.  At this point he is medically stable to be discharged, will prescribe Librium taper.  Assessment and Plan: Gastritis without bleeding Patient still had some black stool yesterday.  EGD did not show additional active bleeding.  Hemoglobin has been stable.  Continue PPI.  Shortness of breath- (present on admission) Echocardiogram showed ejection fraction 55 to 60% with grade 1 diastolic dysfunction.  Patient has no evidence of volume overload.  Short of breath since has  improved.  Patient does not have any hypoxemia.  Alcoholic cirrhosis of liver with ascites ([facility])- (present on admission)  Patient does not have altered mental status.  Follow-up with GI as outpatient  Portal hypertensive gastropathy ([place])- (present on admission) Continue PPI.  Follow-up with GI as outpatient.  Hypokalemia- (present on admission) Improved.  COVID-19 virus infection- (present on admission) COVID  was diagnosed about 2 weeks ago, no need for treatment.  Alcohol dependence ([place])- (present on admission) Patient still has no evidence of alcohol withdrawal.  Will prescribe Librium taper at time of discharge.  Advised to quit alcohol: Specifically asked patient to not use benzodiazepines together with alcohol.  Tobacco abuse- (present on admission) Advised to quit.  Hypertension- (present on admission) Resume home treatment.        Consultants: GI Procedures performed: EGD  Disposition: Home Diet recommendation:  Discharge Diet Orders (From admission, onward)     Start     Ordered   11/17/21 0000  Diet - low sodium heart healthy        11/17/21 1038           Cardiac diet  DISCHARGE MEDICATION: Allergies as of 11/17/2021       Reactions   Chantix [varenicline]    Lexapro [escitalopram]            Medication List     STOP taking these medications    montelukast 10 MG tablet Commonly known as: SINGULAIR   Oxycodone HCl 10 MG Tabs       TAKE these medications    chlordiazePOXIDE 10 MG capsule  Commonly known as: LIBRIUM Take 2 capsules (20 mg total) by mouth 3 (three) times daily for 2 days, THEN 1 capsule (10 mg total) 3 (three) times daily for 2 days. Start taking on: November 17, 2021   FeroSul 325 (65 FE) MG tablet Generic drug: ferrous sulfate Take 325 mg by mouth every morning.   folic acid 1 MG tablet Commonly known as: FOLVITE Take 1 mg by mouth daily.   furosemide 40 MG tablet Commonly known as: LASIX Take 40 mg by  mouth daily. What changed: Another medication with the same name was removed. Continue taking this medication, and follow the directions you see here.   pantoprazole 40 MG tablet Commonly known as: PROTONIX Take 1 tablet (40 mg total) by mouth 2 (two) times daily.   spironolactone 100 MG tablet Commonly known as: ALDACTONE Take 100 mg by mouth daily. What changed: Another medication with the same name was removed. Continue taking this medication, and follow the directions you see here.   Thera-M Tabs Take 1 tablet by mouth daily.   thiamine 100 MG tablet Take 100 mg by mouth daily.        Follow-up Information     Tama High III, MD Follow up in 1 week(s).   Specialty: Internal Medicine Contact information: [place] [address] [phone]         Annamaria Helling, DO Follow up in 2 week(s).   Specialty: Gastroenterology Contact information: [facility] [place] [address] [phone]                 Discharge Exam: Danley Danker Weights   11/15/21 1755 11/16/21 1146  Weight: 99.8 kg 99.8 kg   General exam: Appears calm and comfortable  Respiratory system: Clear to auscultation. Respiratory effort normal. Cardiovascular system: S1 & S2 heard, RRR. No JVD, murmurs, rubs, gallops or clicks. No pedal edema. Gastrointestinal system: Abdomen is nondistended, soft and nontender. No organomegaly or masses felt. Normal bowel sounds heard. Central nervous system: Alert and oriented. No focal neurological deficits. Extremities: Symmetric 5 x 5 power. Skin: No rashes, lesions or ulcers Psychiatry: Judgement and insight appear normal. Mood & affect appropriate.    Condition at discharge: good  The results of significant diagnostics from this hospitalization (including imaging, microbiology, ancillary and laboratory) are listed below for reference.   Imaging Studies: US Abdomen  Limited  Result Date: 11/15/2021 CLINICAL DATA:  Abdominal distension EXAM: LIMITED ABDOMEN ULTRASOUND FOR ASCITES TECHNIQUE: Limited ultrasound survey for ascites was performed in all four abdominal quadrants. COMPARISON:  02/23/2021 FINDINGS: No significant ascites is seen IMPRESSION: No significant ascites is seen. Electronically Signed   By: Donavan Foil M.D.   On: 11/15/2021 19:19   ECHOCARDIOGRAM COMPLETE  Result Date: 11/17/2021    ECHOCARDIOGRAM REPORT   Patient Name:   ROSE HIPPLER Date of Exam: 11/17/2021 Medical Rec #:  [id number]       Height:       71.0 in Accession #:    [id number]      Weight:       220.0 lb Date of Birth:  Oct 04, 1971       BSA:          2.196 m Patient Age:    29 years        BP:           132/92 mmHg Patient Gender: M  HR:           98 bpm. Exam Location:  [facility] Procedure: 2D Echo Indications:     Dyspnea R06.00  History:         Patient has prior history of Echocardiogram examinations, most                  recent 07/05/2016. Abnormal ECG.  Sonographer:     Kathlen Brunswick RDCS Referring Phys:  [id number] AMY N COX Diagnosing Phys: Skeet Latch MD IMPRESSIONS  1. Left ventricular ejection fraction, by estimation, is 55 to 60%. The left ventricle has normal function. The left ventricle has no regional wall motion abnormalities. Left ventricular diastolic parameters are consistent with Grade I diastolic dysfunction (impaired relaxation).  2. Right ventricular systolic function is normal. The right ventricular size is normal.  3. The mitral valve is normal in structure. No evidence of mitral valve regurgitation. No evidence of mitral stenosis.  4. The aortic valve is tricuspid. Aortic valve regurgitation is not visualized. No aortic stenosis is present.  5. The inferior vena cava is normal in size with greater than 50% respiratory variability, suggesting right atrial pressure of 3 mmHg. FINDINGS  Left Ventricle: Left ventricular ejection fraction, by estimation, is  55 to 60%. The left ventricle has normal function. The left ventricle has no regional wall motion abnormalities. The left ventricular internal cavity size was normal in size. There is  no left ventricular hypertrophy. Left ventricular diastolic parameters are consistent with Grade I diastolic dysfunction (impaired relaxation). Right Ventricle: The right ventricular size is normal. No increase in right ventricular wall thickness. Right ventricular systolic function is normal. Left Atrium: Left atrial size was normal in size. Right Atrium: Right atrial size was normal in size. Pericardium: There is no evidence of pericardial effusion. Mitral Valve: The mitral valve is normal in structure. No evidence of mitral valve regurgitation. No evidence of mitral valve stenosis. Tricuspid Valve: The tricuspid valve is normal in structure. Tricuspid valve regurgitation is trivial. No evidence of tricuspid stenosis. Aortic Valve: The aortic valve is tricuspid. Aortic valve regurgitation is not visualized. No aortic stenosis is present. Aortic valve peak gradient measures 5.7 mmHg. Pulmonic Valve: The pulmonic valve was normal in structure. Pulmonic valve regurgitation is trivial. No evidence of pulmonic stenosis. Aorta: The aortic root is normal in size and structure. Venous: The inferior vena cava is normal in size with greater than 50% respiratory variability, suggesting right atrial pressure of 3 mmHg. IAS/Shunts: No atrial level shunt detected by color flow Doppler.  LEFT VENTRICLE PLAX 2D LVIDd:         5.26 cm      Diastology LVIDs:         3.61 cm      LV e' medial:    10.60 cm/s LV PW:         1.01 cm      LV E/e' medial:  6.4 LV IVS:        0.92 cm      LV e' lateral:   9.90 cm/s LVOT diam:     2.30 cm      LV E/e' lateral: 6.8 LV SV:         64 LV SV Index:   29 LVOT Area:     4.15 cm  LV Volumes (MOD) LV vol d, MOD A2C: 81.3 ml LV vol d, MOD A4C: 104.0 ml LV vol s, MOD A2C: 35.2 ml LV vol s, MOD A4C: 44.1  ml LV SV MOD  A2C:     46.1 ml LV SV MOD A4C:     104.0 ml LV SV MOD BP:      56.1 ml RIGHT VENTRICLE RV Basal diam:  3.32 cm RV S prime:     15.70 cm/s TAPSE (M-mode): 2.4 cm LEFT ATRIUM           Index        RIGHT ATRIUM           Index LA diam:      3.50 cm 1.59 cm/m   RA Area:     13.60 cm LA Vol (A2C): 32.2 ml 14.66 ml/m  RA Volume:   34.50 ml  15.71 ml/m LA Vol (A4C): 32.5 ml 14.80 ml/m  AORTIC VALVE                 PULMONIC VALVE AV Area (Vmax): 2.87 cm     PV Vmax:       0.96 m/s AV Vmax:        119.00 cm/s  PV Peak grad:  3.7 mmHg AV Peak Grad:   5.7 mmHg LVOT Vmax:      82.20 cm/s LVOT Vmean:     49.300 cm/s LVOT VTI:       0.153 m  AORTA Ao Root diam: 3.30 cm MITRAL VALVE               TRICUSPID VALVE MV Area (PHT): 4.80 cm    TV Peak grad:   19.5 mmHg MV Decel Time: 158 msec    TV Vmax:        2.21 m/s MV E velocity: 67.70 cm/s  TR Peak grad:   19.4 mmHg MV A velocity: 81.40 cm/s  TR Vmax:        220.00 cm/s MV E/A ratio:  0.83                            SHUNTS                            Systemic VTI:  0.15 m                            Systemic Diam: 2.30 cm Skeet Latch MD Electronically signed by Skeet Latch MD Signature Date/Time: 11/17/2021/9:48:08 AM    Final     Microbiology: Results for orders placed or performed during the hospital encounter of 11/15/21  Resp Panel by RT-PCR (Flu A&B, Covid) Nasopharyngeal Swab     Status: Abnormal   Collection Time: 11/15/21  8:55 PM   Specimen: Nasopharyngeal Swab; Nasopharyngeal(NP) swabs in vial transport medium  Result Value Ref Range Status   SARS Coronavirus 2 by RT PCR POSITIVE (A) NEGATIVE Final    Comment: (NOTE) SARS-CoV-2 target nucleic acids are DETECTED.  The SARS-CoV-2 RNA is generally detectable in upper respiratory specimens during the acute phase of infection. Positive results are indicative of the presence of the identified virus, but do not rule out bacterial infection or co-infection with other pathogens not detected by the  test. Clinical correlation with patient history and other diagnostic information is necessary to determine patient infection status. The expected result is Negative.  Fact Sheet for Patients: EntrepreneurPulse.com.au  Fact Sheet for Healthcare Providers: IncredibleEmployment.be  This test is not yet approved or cleared by the [place]  FDA and  has been authorized for detection and/or diagnosis of SARS-CoV-2 by FDA under an Emergency Use Authorization (EUA).  This EUA will remain in effect (meaning this test can be used) for the duration of  the COVID-19 declaration under Section 564(b)(1) of the A ct, 21 U.S.C. section 360bbb-3(b)(1), unless the authorization is terminated or revoked sooner.     Influenza A by PCR NEGATIVE NEGATIVE Final   Influenza B by PCR NEGATIVE NEGATIVE Final    Comment: (NOTE) The Xpert Xpress SARS-CoV-2/FLU/RSV plus assay is intended as an aid in the diagnosis of influenza from Nasopharyngeal swab specimens and should not be used as a sole basis for treatment. Nasal washings and aspirates are unacceptable for Xpert Xpress SARS-CoV-2/FLU/RSV testing.  Fact Sheet for Patients: EntrepreneurPulse.com.au  Fact Sheet for Healthcare Providers: IncredibleEmployment.be  This test is not yet approved or cleared by the [place] FDA and has been authorized for detection and/or diagnosis of SARS-CoV-2 by FDA under an Emergency Use Authorization (EUA). This EUA will remain in effect (meaning this test can be used) for the duration of the COVID-19 declaration under Section 564(b)(1) of the Act, 21 U.S.C. section 360bbb-3(b)(1), unless the authorization is terminated or revoked.  Performed at [facility], [address] [place], [place] [address]     Labs: CBC: Recent Labs  Lab 11/15/21 1756 11/16/21 0622 11/17/21 0750  WBC 11.0* 7.2 4.6  HGB 15.8 14.1 13.6  HCT  43.8 39.8 39.4  MCV 95.4 97.3 96.8  PLT 141* 95* 75*   Basic Metabolic Panel: Recent Labs  Lab 11/15/21 1756 11/15/21 1803 11/16/21 0622 11/17/21 0750  NA 130*  --  133* 135  K 3.1*  --  3.7 3.8  CL 92*  --  98 104  CO2 24  --  25 23  GLUCOSE 142*  --  135* 129*  BUN 8  --  8 7  CREATININE 0.76  --  0.66 0.58*  CALCIUM 8.4*  --  7.8* 7.7*  MG  --  2.0  --  2.0  PHOS  --  2.7  --   --    Liver Function Tests: Recent Labs  Lab 11/15/21 1756 11/16/21 0622  AST 254* 256*  ALT 194* 181*  ALKPHOS 116 100  BILITOT 1.4* 2.4*  PROT 8.1 7.0  ALBUMIN 4.0 3.7   CBG: No results for input(s): GLUCAP in the last 168 hours.  Discharge time spent: greater than 30 minutes.  Signed: Sharen Hones, MD [facility] 11/17/2021

## 2021-11-17 NOTE — TOC Initial Note (Signed)
Transition of Care [facility]) - Initial/Assessment Note    Patient Details  Name: Omar Turner MRN: [id number] Date of Birth: 04/08/1971  [facility] CM/SW Contact:    Magnus Ivan, LCSW Phone Number: 11/17/2021, 11:01 AM  Clinical Narrative:                TOC consulted for SA resources. Patient to DC home today per chart. CSW spoke with patient who declines SA resources at this time. Patient goes to Dr. Caryl Comes for PCP and uses [facility] on [address].  Patient stated he has reliable transportation (drives himself) and no issues obtaining medications. Patient stated he has some paperwork for work that he needs the Attending to sign. Patient to email paperwork to CSW so CSW can print it to the unit and patient will ask MD to sign prior to DC. Patient will drive himself home today when DC. No additional TOC needs.   Expected Discharge Plan: Home/Self Care Barriers to Discharge: Barriers Resolved   Patient Goals and CMS Choice Patient states their goals for this hospitalization and ongoing recovery are:: to DC home CMS Medicare.gov Compare Post Acute Care list provided to:: Patient Choice offered to / list presented to : Patient  Expected Discharge Plan and Services Expected Discharge Plan: Home/Self Care       Living arrangements for the past 2 months: Single Family Home Expected Discharge Date: 11/17/21                                    Prior Living Arrangements/Services Living arrangements for the past 2 months: Single Family Home Lives with:: Self Patient language and need for interpreter reviewed:: Yes Do you feel safe going back to the place where you live?: Yes      Need for Family Participation in Patient Care: Yes (Comment) Care giver support system in place?: Yes (comment)   Criminal Activity/Legal Involvement Pertinent to Current Situation/Hospitalization: No - Comment as needed  Activities of Daily Living Home Assistive  Devices/Equipment: None ADL Screening (condition at time of admission) Patient's cognitive ability adequate to safely complete daily activities?: Yes Is the patient deaf or have difficulty hearing?: No Does the patient have difficulty seeing, even when wearing glasses/contacts?: No Does the patient have difficulty concentrating, remembering, or making decisions?: No Patient able to express need for assistance with ADLs?: Yes Does the patient have difficulty dressing or bathing?: No Independently performs ADLs?: No Does the patient have difficulty walking or climbing stairs?: No Weakness of Legs: None Weakness of Arms/Hands: None  Permission Sought/Granted                  Emotional Assessment       Orientation: : Oriented to Self, Oriented to Place, Oriented to  Time, Oriented to Situation Alcohol / Substance Use: Alcohol Use Psych Involvement: No (comment)  Admission diagnosis:  Alcohol abuse [F10.10] Upper GI bleed [K92.2] Gastrointestinal hemorrhage, unspecified gastrointestinal hemorrhage type [K92.2] Acute upper GI bleed [K92.2] Patient Active Problem List   Diagnosis Date Noted   Gastritis with bleeding 11/17/2021   Shortness of breath 11/16/2021   Gastritis without bleeding 11/16/2021   Acute upper GI bleed 65/46/5035   Alcoholic cirrhosis of liver with ascites ([place]) 11/15/2021   Electrolyte abnormality 11/15/2021   Incarcerated ventral hernia 01/17/2021   Other specified anemias 01/03/2021   Alcohol dependence ([place]) 01/02/2021   Portal hypertensive gastropathy (  [place]) 01/02/2021   Ascites 12/11/2020   Bilateral lower extremity edema 12/11/2020   Melena 12/11/2020   COVID-19 virus infection 09/23/2020   Elevated LFTs 09/23/2020   Hypokalemia 09/23/2020   Nausea & vomiting 09/23/2020   Alcohol abuse with withdrawal ([place]) 08/09/2019   DDD (degenerative disc disease), cervical 03/20/2018   Hypertension 03/20/2018   Sleep apnea 03/20/2018   Diverticulitis of  large intestine with perforation without abscess or bleeding 03/15/2018   Gastroesophageal reflux disease without esophagitis 04/03/2017   History of chest pain 04/03/2017   Other hyperlipidemia 04/03/2017   Precordial pain    Tobacco abuse 08/21/2015   Anxiety state 02/15/2015   PCP:  Adin Hector, MD Pharmacy:   [facility] [place], [place] [address] [place] [address] Phone: [phone] Fax: [phone]     Social Determinants of Health (SDOH) Interventions    Readmission Risk Interventions No flowsheet data found.

## 2021-11-17 NOTE — Assessment & Plan Note (Signed)
Advised to quit.  

## 2021-11-17 NOTE — Assessment & Plan Note (Signed)
Continue PPI.  Follow-up with GI as outpatient.

## 2021-11-17 NOTE — Assessment & Plan Note (Signed)
Echocardiogram showed ejection fraction 55 to 60% with grade 1 diastolic dysfunction.  Patient has no evidence of volume overload.  Short of breath since has improved.  Patient does not have any hypoxemia.

## 2021-11-17 NOTE — Progress Notes (Signed)
Patient provided with discharge education and materials, verbalized understanding. IV access and telemetry monitor removed, no issues noted at this time. All personal items with patient. Patient states currently waiting for paperwork needed from SW before discharging unit.

## 2021-11-19 ENCOUNTER — Encounter: Payer: Self-pay | Admitting: Gastroenterology

## 2022-02-22 ENCOUNTER — Inpatient Hospital Stay (HOSPITAL_COMMUNITY): Payer: BC Managed Care – PPO

## 2022-02-22 ENCOUNTER — Emergency Department (HOSPITAL_COMMUNITY): Payer: BC Managed Care – PPO

## 2022-02-22 ENCOUNTER — Other Ambulatory Visit: Payer: Self-pay

## 2022-02-22 ENCOUNTER — Inpatient Hospital Stay (HOSPITAL_COMMUNITY)
Admission: EM | Admit: 2022-02-22 | Discharge: 2022-02-26 | DRG: 369 | Disposition: A | Discharge status: Home / Self Care | Payer: BC Managed Care – PPO | Attending: Internal Medicine | Admitting: Internal Medicine

## 2022-02-22 DIAGNOSIS — R948 Abnormal results of function studies of other organs and systems: Secondary | ICD-10-CM | POA: Diagnosis present

## 2022-02-22 DIAGNOSIS — K644 Residual hemorrhoidal skin tags: Secondary | ICD-10-CM | POA: Diagnosis present

## 2022-02-22 DIAGNOSIS — D122 Benign neoplasm of ascending colon: Secondary | ICD-10-CM | POA: Diagnosis not present

## 2022-02-22 DIAGNOSIS — Z79899 Other long term (current) drug therapy: Secondary | ICD-10-CM

## 2022-02-22 DIAGNOSIS — Z888 Allergy status to other drugs, medicaments and biological substances status: Secondary | ICD-10-CM | POA: Diagnosis not present

## 2022-02-22 DIAGNOSIS — K5730 Diverticulosis of large intestine without perforation or abscess without bleeding: Secondary | ICD-10-CM | POA: Diagnosis present

## 2022-02-22 DIAGNOSIS — Z8249 Family history of ischemic heart disease and other diseases of the circulatory system: Secondary | ICD-10-CM

## 2022-02-22 DIAGNOSIS — K760 Fatty (change of) liver, not elsewhere classified: Secondary | ICD-10-CM | POA: Diagnosis present

## 2022-02-22 DIAGNOSIS — F10231 Alcohol dependence with withdrawal delirium: Secondary | ICD-10-CM | POA: Diagnosis not present

## 2022-02-22 DIAGNOSIS — K7200 Acute and subacute hepatic failure without coma: Secondary | ICD-10-CM | POA: Diagnosis not present

## 2022-02-22 DIAGNOSIS — D539 Nutritional anemia, unspecified: Secondary | ICD-10-CM | POA: Diagnosis present

## 2022-02-22 DIAGNOSIS — Z20822 Contact with and (suspected) exposure to covid-19: Secondary | ICD-10-CM | POA: Diagnosis present

## 2022-02-22 DIAGNOSIS — K7040 Alcoholic hepatic failure without coma: Secondary | ICD-10-CM | POA: Diagnosis present

## 2022-02-22 DIAGNOSIS — R933 Abnormal findings on diagnostic imaging of other parts of digestive tract: Secondary | ICD-10-CM

## 2022-02-22 DIAGNOSIS — K922 Gastrointestinal hemorrhage, unspecified: Secondary | ICD-10-CM | POA: Diagnosis not present

## 2022-02-22 DIAGNOSIS — K7031 Alcoholic cirrhosis of liver with ascites: Secondary | ICD-10-CM | POA: Diagnosis present

## 2022-02-22 DIAGNOSIS — Z86010 Personal history of colonic polyps: Secondary | ICD-10-CM

## 2022-02-22 DIAGNOSIS — K226 Gastro-esophageal laceration-hemorrhage syndrome: Secondary | ICD-10-CM | POA: Diagnosis present

## 2022-02-22 DIAGNOSIS — K7011 Alcoholic hepatitis with ascites: Secondary | ICD-10-CM | POA: Diagnosis present

## 2022-02-22 DIAGNOSIS — E669 Obesity, unspecified: Secondary | ICD-10-CM | POA: Diagnosis present

## 2022-02-22 DIAGNOSIS — E8770 Fluid overload, unspecified: Secondary | ICD-10-CM | POA: Diagnosis present

## 2022-02-22 DIAGNOSIS — Y906 Blood alcohol level of 120-199 mg/100 ml: Secondary | ICD-10-CM | POA: Diagnosis present

## 2022-02-22 DIAGNOSIS — Z6830 Body mass index (BMI) 30.0-30.9, adult: Secondary | ICD-10-CM

## 2022-02-22 DIAGNOSIS — E876 Hypokalemia: Secondary | ICD-10-CM | POA: Diagnosis present

## 2022-02-22 DIAGNOSIS — E785 Hyperlipidemia, unspecified: Secondary | ICD-10-CM | POA: Diagnosis present

## 2022-02-22 DIAGNOSIS — K3189 Other diseases of stomach and duodenum: Secondary | ICD-10-CM | POA: Diagnosis present

## 2022-02-22 DIAGNOSIS — M1990 Unspecified osteoarthritis, unspecified site: Secondary | ICD-10-CM | POA: Diagnosis present

## 2022-02-22 DIAGNOSIS — I8510 Secondary esophageal varices without bleeding: Secondary | ICD-10-CM | POA: Diagnosis present

## 2022-02-22 DIAGNOSIS — K766 Portal hypertension: Secondary | ICD-10-CM | POA: Diagnosis present

## 2022-02-22 DIAGNOSIS — F1010 Alcohol abuse, uncomplicated: Secondary | ICD-10-CM

## 2022-02-22 DIAGNOSIS — F1020 Alcohol dependence, uncomplicated: Secondary | ICD-10-CM | POA: Diagnosis present

## 2022-02-22 DIAGNOSIS — K648 Other hemorrhoids: Secondary | ICD-10-CM | POA: Diagnosis present

## 2022-02-22 DIAGNOSIS — E871 Hypo-osmolality and hyponatremia: Secondary | ICD-10-CM | POA: Diagnosis present

## 2022-02-22 DIAGNOSIS — D509 Iron deficiency anemia, unspecified: Secondary | ICD-10-CM | POA: Diagnosis present

## 2022-02-22 DIAGNOSIS — I10 Essential (primary) hypertension: Secondary | ICD-10-CM | POA: Diagnosis present

## 2022-02-22 DIAGNOSIS — F17210 Nicotine dependence, cigarettes, uncomplicated: Secondary | ICD-10-CM | POA: Diagnosis present

## 2022-02-22 DIAGNOSIS — K2100 Gastro-esophageal reflux disease with esophagitis, without bleeding: Secondary | ICD-10-CM | POA: Diagnosis present

## 2022-02-22 DIAGNOSIS — F10139 Alcohol abuse with withdrawal, unspecified: Secondary | ICD-10-CM | POA: Diagnosis not present

## 2022-02-22 DIAGNOSIS — K635 Polyp of colon: Secondary | ICD-10-CM | POA: Diagnosis not present

## 2022-02-22 DIAGNOSIS — G4730 Sleep apnea, unspecified: Secondary | ICD-10-CM | POA: Diagnosis present

## 2022-02-22 DIAGNOSIS — D696 Thrombocytopenia, unspecified: Secondary | ICD-10-CM | POA: Diagnosis present

## 2022-02-22 DIAGNOSIS — R42 Dizziness and giddiness: Secondary | ICD-10-CM | POA: Diagnosis present

## 2022-02-22 DIAGNOSIS — R110 Nausea: Secondary | ICD-10-CM | POA: Diagnosis present

## 2022-02-22 DIAGNOSIS — R195 Other fecal abnormalities: Secondary | ICD-10-CM | POA: Diagnosis not present

## 2022-02-22 DIAGNOSIS — F419 Anxiety disorder, unspecified: Secondary | ICD-10-CM | POA: Diagnosis present

## 2022-02-22 DIAGNOSIS — K920 Hematemesis: Principal | ICD-10-CM

## 2022-02-22 DIAGNOSIS — K921 Melena: Secondary | ICD-10-CM | POA: Diagnosis not present

## 2022-02-22 LAB — COMPREHENSIVE METABOLIC PANEL
ALT: 141 U/L — ABNORMAL HIGH (ref 0–44)
AST: 333 U/L — ABNORMAL HIGH (ref 15–41)
Albumin: 3.2 g/dL — ABNORMAL LOW (ref 3.5–5.0)
Alkaline Phosphatase: 234 U/L — ABNORMAL HIGH (ref 38–126)
Anion gap: 18 — ABNORMAL HIGH (ref 5–15)
BUN: 7 mg/dL (ref 6–20)
CO2: 25 mmol/L (ref 22–32)
Calcium: 8.4 mg/dL — ABNORMAL LOW (ref 8.9–10.3)
Chloride: 84 mmol/L — ABNORMAL LOW (ref 98–111)
Creatinine, Ser: 0.84 mg/dL (ref 0.61–1.24)
GFR, Estimated: >60 mL/min (ref >60)
Glucose, Bld: 117 mg/dL — ABNORMAL HIGH (ref 70–99)
Potassium: 2.9 mmol/L — ABNORMAL LOW (ref 3.5–5.1)
Sodium: 127 mmol/L — ABNORMAL LOW (ref 135–145)
Total Bilirubin: 6.4 mg/dL — ABNORMAL HIGH (ref 0.3–1.2)
Total Protein: 7.7 g/dL (ref 6.5–8.1)

## 2022-02-22 LAB — CBC
HCT: 43.5 % (ref 39.0–52.0)
Hemoglobin: 15.6 g/dL (ref 13.0–17.0)
MCH: 34.6 pg — ABNORMAL HIGH (ref 26.0–34.0)
MCHC: 35.9 g/dL (ref 30.0–36.0)
MCV: 96.5 fL (ref 80.0–100.0)
Platelets: 67 10*3/uL — ABNORMAL LOW (ref 150–400)
RBC: 4.51 MIL/uL (ref 4.22–5.81)
RDW: 14.3 % (ref 11.5–15.5)
WBC: 7.5 10*3/uL (ref 4.0–10.5)
nRBC: 0 % (ref 0.0–0.2)

## 2022-02-22 LAB — POC OCCULT BLOOD, ED: Fecal Occult Bld: POSITIVE — AB

## 2022-02-22 LAB — RAPID URINE DRUG SCREEN, HOSP PERFORMED
Amphetamines: NOT DETECTED
Barbiturates: NOT DETECTED
Benzodiazepines: NOT DETECTED
Cocaine: NOT DETECTED
Opiates: NOT DETECTED
Tetrahydrocannabinol: NOT DETECTED

## 2022-02-22 LAB — LIPASE, BLOOD: Lipase: 89 U/L — ABNORMAL HIGH (ref 11–51)

## 2022-02-22 LAB — AMMONIA: Ammonia: 54 umol/L — ABNORMAL HIGH (ref 9–35)

## 2022-02-22 LAB — URINALYSIS, ROUTINE W REFLEX MICROSCOPIC
Glucose, UA: NEGATIVE mg/dL
Ketones, ur: 20 mg/dL — AB
Leukocytes,Ua: NEGATIVE
Nitrite: NEGATIVE
Protein, ur: 30 mg/dL — AB
Specific Gravity, Urine: 1.032 — ABNORMAL HIGH (ref 1.005–1.030)
pH: 5 (ref 5.0–8.0)

## 2022-02-22 LAB — PROTIME-INR
INR: 1.2 (ref 0.8–1.2)
Prothrombin Time: 15.5 seconds — ABNORMAL HIGH (ref 11.4–15.2)

## 2022-02-22 LAB — MAGNESIUM: Magnesium: 1.8 mg/dL (ref 1.7–2.4)

## 2022-02-22 LAB — HEMOGLOBIN AND HEMATOCRIT, BLOOD
HCT: 40.6 % (ref 39.0–52.0)
Hemoglobin: 14.8 g/dL (ref 13.0–17.0)

## 2022-02-22 LAB — PHOSPHORUS: Phosphorus: 3.2 mg/dL (ref 2.5–4.6)

## 2022-02-22 LAB — ETHANOL: Alcohol, Ethyl (B): 156 mg/dL — ABNORMAL HIGH (ref <10)

## 2022-02-22 MED ORDER — LORAZEPAM 1 MG PO TABS
0.0000 mg | ORAL_TABLET | Freq: Two times a day (BID) | ORAL | Status: AC
  Administered 2022-02-24 – 2022-02-26 (×4): 1 mg via ORAL
  Filled 2022-02-22 (×3): qty 1
  Filled 2022-02-22: qty 2

## 2022-02-22 MED ORDER — PANTOPRAZOLE SODIUM 40 MG PO TBEC
40.0000 mg | DELAYED_RELEASE_TABLET | Freq: Two times a day (BID) | ORAL | Status: DC
  Administered 2022-02-22 – 2022-02-26 (×8): 40 mg via ORAL
  Filled 2022-02-22 (×8): qty 1

## 2022-02-22 MED ORDER — LACTULOSE 10 GM/15ML PO SOLN
30.0000 g | Freq: Two times a day (BID) | ORAL | Status: DC
  Administered 2022-02-22 – 2022-02-23 (×2): 30 g via ORAL
  Filled 2022-02-22 (×3): qty 45

## 2022-02-22 MED ORDER — LORAZEPAM 1 MG PO TABS
1.0000 mg | ORAL_TABLET | ORAL | Status: AC | PRN
  Administered 2022-02-24: 2 mg via ORAL
  Filled 2022-02-22: qty 2

## 2022-02-22 MED ORDER — POTASSIUM CHLORIDE 10 MEQ/100ML IV SOLN
10.0000 meq | INTRAVENOUS | Status: AC
  Administered 2022-02-22 (×4): 10 meq via INTRAVENOUS
  Filled 2022-02-22 (×4): qty 100

## 2022-02-22 MED ORDER — PANTOPRAZOLE SODIUM 40 MG IV SOLR
40.0000 mg | Freq: Once | INTRAVENOUS | Status: AC
  Administered 2022-02-22: 40 mg via INTRAVENOUS
  Filled 2022-02-22: qty 10

## 2022-02-22 MED ORDER — THIAMINE HCL 100 MG PO TABS
100.0000 mg | ORAL_TABLET | Freq: Every day | ORAL | Status: DC
  Administered 2022-02-22 – 2022-02-26 (×5): 100 mg via ORAL
  Filled 2022-02-22 (×5): qty 1

## 2022-02-22 MED ORDER — FERROUS SULFATE 325 (65 FE) MG PO TABS
325.0000 mg | ORAL_TABLET | Freq: Every morning | ORAL | Status: DC
  Administered 2022-02-22 – 2022-02-26 (×5): 325 mg via ORAL
  Filled 2022-02-22 (×5): qty 1

## 2022-02-22 MED ORDER — QUETIAPINE FUMARATE 50 MG PO TABS
25.0000 mg | ORAL_TABLET | Freq: Every day | ORAL | Status: DC
  Administered 2022-02-22 – 2022-02-25 (×4): 25 mg via ORAL
  Filled 2022-02-22 (×5): qty 1

## 2022-02-22 MED ORDER — ADULT MULTIVITAMIN W/MINERALS CH
1.0000 | ORAL_TABLET | Freq: Every day | ORAL | Status: DC
  Administered 2022-02-22 – 2022-02-26 (×5): 1 via ORAL
  Filled 2022-02-22 (×5): qty 1

## 2022-02-22 MED ORDER — FOLIC ACID 1 MG PO TABS
1.0000 mg | ORAL_TABLET | Freq: Every day | ORAL | Status: DC
  Administered 2022-02-22 – 2022-02-26 (×5): 1 mg via ORAL
  Filled 2022-02-22 (×5): qty 1

## 2022-02-22 MED ORDER — SODIUM CHLORIDE 0.9 % IV SOLN: Freq: Once | INTRAVENOUS | Status: AC

## 2022-02-22 MED ORDER — FUROSEMIDE 40 MG PO TABS
40.0000 mg | ORAL_TABLET | Freq: Every day | ORAL | Status: DC
  Administered 2022-02-22 – 2022-02-26 (×5): 40 mg via ORAL
  Filled 2022-02-22 (×3): qty 1
  Filled 2022-02-22: qty 2
  Filled 2022-02-22: qty 1

## 2022-02-22 MED ORDER — NICOTINE 21 MG/24HR TD PT24
21.0000 mg | MEDICATED_PATCH | Freq: Every day | TRANSDERMAL | Status: DC
Start: 2022-02-22 — End: 2022-02-26
  Administered 2022-02-22 – 2022-02-26 (×5): 21 mg via TRANSDERMAL
  Filled 2022-02-22 (×5): qty 1

## 2022-02-22 MED ORDER — SPIRONOLACTONE 100 MG PO TABS
100.0000 mg | ORAL_TABLET | Freq: Every day | ORAL | Status: DC
  Administered 2022-02-22 – 2022-02-26 (×5): 100 mg via ORAL
  Filled 2022-02-22 (×5): qty 1

## 2022-02-22 MED ORDER — LORAZEPAM 2 MG/ML IJ SOLN
2.0000 mg | Freq: Once | INTRAMUSCULAR | Status: AC
  Administered 2022-02-22: 2 mg via INTRAVENOUS
  Filled 2022-02-22: qty 1

## 2022-02-22 MED ORDER — THIAMINE HCL 100 MG/ML IJ SOLN
100.0000 mg | Freq: Every day | INTRAMUSCULAR | Status: DC
  Filled 2022-02-22: qty 2

## 2022-02-22 MED ORDER — ONDANSETRON HCL 4 MG/2ML IJ SOLN
4.0000 mg | Freq: Four times a day (QID) | INTRAMUSCULAR | Status: DC | PRN
  Administered 2022-02-23: 4 mg via INTRAVENOUS
  Filled 2022-02-22: qty 2

## 2022-02-22 MED ORDER — MONTELUKAST SODIUM 10 MG PO TABS
10.0000 mg | ORAL_TABLET | Freq: Every day | ORAL | Status: DC
  Administered 2022-02-22 – 2022-02-26 (×5): 10 mg via ORAL
  Filled 2022-02-22 (×5): qty 1

## 2022-02-22 MED ORDER — IOHEXOL 350 MG/ML SOLN
100.0000 mL | Freq: Once | INTRAVENOUS | Status: AC | PRN
  Administered 2022-02-22: 100 mL via INTRAVENOUS

## 2022-02-22 MED ORDER — PROPRANOLOL HCL 10 MG PO TABS
10.0000 mg | ORAL_TABLET | Freq: Three times a day (TID) | ORAL | Status: DC
  Filled 2022-02-22 (×2): qty 1

## 2022-02-22 MED ORDER — FOLIC ACID 1 MG PO TABS: 1.0000 mg | ORAL_TABLET | Freq: Every day | ORAL | Status: DC

## 2022-02-22 MED ORDER — ONDANSETRON HCL 4 MG/2ML IJ SOLN
4.0000 mg | Freq: Once | INTRAMUSCULAR | Status: AC | PRN
  Administered 2022-02-22: 4 mg via INTRAVENOUS
  Filled 2022-02-22: qty 2

## 2022-02-22 MED ORDER — LORAZEPAM 1 MG PO TABS
0.0000 mg | ORAL_TABLET | Freq: Four times a day (QID) | ORAL | Status: AC
  Administered 2022-02-22: 1 mg via ORAL
  Administered 2022-02-23 (×3): 2 mg via ORAL
  Administered 2022-02-23 – 2022-02-24 (×2): 1 mg via ORAL
  Filled 2022-02-22 (×2): qty 2
  Filled 2022-02-22: qty 1
  Filled 2022-02-22: qty 2
  Filled 2022-02-22 (×2): qty 1

## 2022-02-22 MED ORDER — SODIUM CHLORIDE 0.9 % IV BOLUS
1000.0000 mL | Freq: Once | INTRAVENOUS | Status: AC
  Administered 2022-02-22: 1000 mL via INTRAVENOUS

## 2022-02-22 MED ORDER — MAGNESIUM OXIDE -MG SUPPLEMENT 400 (240 MG) MG PO TABS
800.0000 mg | ORAL_TABLET | Freq: Once | ORAL | Status: AC
  Administered 2022-02-22: 800 mg via ORAL
  Filled 2022-02-22: qty 2

## 2022-02-22 MED ORDER — LORAZEPAM 2 MG/ML IJ SOLN: 1.0000 mg | INTRAMUSCULAR | Status: AC | PRN

## 2022-02-22 NOTE — ED Notes (Signed)
Admit MD at bedside

## 2022-02-22 NOTE — ED Triage Notes (Signed)
Pt reports hx of cirrhosis and states blood in emesis/stool/urine x 2 weeks. Seen by pcp for complaint with no relief. Pt states blood is bright red in emesis and stool. Pt states emesis x 3 today. Last drink 2am today--normally drink 1/5 to 1/2gal of vodka daily. GCS 15.

## 2022-02-22 NOTE — H&P (Signed)
History and Physical    [name]:[id number] DOB: [birth year] DOA: 02/22/2022  PCP: Adin Hector, MD (Confirm with patient/family/NH records and if not entered, this has to be entered at [facility] point of entry) Patient coming from: Home  I have personally briefly reviewed patient's old medical records in [place]  Chief Complaint: Feeling shaky, vomiting blood  HPI: Omar Turner is a 51 y.o. male with medical history significant of cirrhosis secondary to alcohol abuse with portal hypertension, ascites, alcoholic hepatitis, GERD, ventral hernia repair, recent GI bleed s/p EGD, came with repeated nauseous vomiting, hematemesis and alcohol withdrawal symptoms.  Patient started to drink alcohol about 1 month ago, with 1/4-1/2 gallon of Vodka daily and starting 2 weeks ago, patient started to have frequent nauseous, retching and occasional vomiting associated with drinking.  He has seen episode of streak of blood vomitus, associated with epigastric pain.  Denies any coffee-ground vomitus, no black tarry stool.  He also claims episodes of blood in the stool and urine, but not today. He continues to drink, but last night, he started to have shaking and mother also notice more distended abdomen and recommended him to come in.  Patient denies any diarrhea, no urinary problems no fever chills no abdominal pain.  ED Course: Tachycardia, no hypotension.  Hemoglobin 15.6 compared to baseline 13-15.  Sodium 127, potassium 2.9, magnesium 1.8, albumin 3.2, ALT 141 AST 333, bilirubin 6.4.  Stool color is brown with weak positive guaiac.  CT abdomen pelvis showed cirrhosis, 2 cm of hypotensive lesion in the liver, malignancy cannot be ruled out.  Review of Systems: As per HPI otherwise 14 point review of systems negative.    Past Medical History:  Diagnosis Date   Anginal pain ([place])    Anxiety    Degenerative disc disease, cervical    Diverticulitis large intestine    GERD  (gastroesophageal reflux disease)    History of chest pain    Hyperlipidemia    Hypertension    Sleep apnea    Tobacco abuse     Past Surgical History:  Procedure Laterality Date   APPENDECTOMY     CARDIAC CATHETERIZATION Left 06/17/2016   Procedure: Left Heart Cath and Coronary Angiography;  Surgeon: Wellington Hampshire, MD;  Location: [place] [facility];  Service: Cardiovascular;  Laterality: Left;   COLONOSCOPY     COLONOSCOPY WITH PROPOFOL N/A 09/07/2015   Procedure: COLONOSCOPY WITH PROPOFOL;  Surgeon: Hulen Luster, MD;  Location: [facility] ENDOSCOPY;  Service: Gastroenterology;  Laterality: N/A;   COLONOSCOPY WITH PROPOFOL N/A 05/12/2018   Procedure: COLONOSCOPY WITH PROPOFOL;  Surgeon: Jonathon Bellows, MD;  Location: [facility] ENDOSCOPY;  Service: Gastroenterology;  Laterality: N/A;   ESOPHAGOGASTRODUODENOSCOPY (EGD) WITH PROPOFOL N/A 11/16/2021   Procedure: ESOPHAGOGASTRODUODENOSCOPY (EGD) WITH PROPOFOL;  Surgeon: Annamaria Helling, DO;  Location: [place];  Service: Gastroenterology;  Laterality: N/A;   HERNIA REPAIR     IR RADIOLOGIST EVAL & MGMT  02/01/2021   VENTRAL HERNIA REPAIR N/A 01/17/2021   Procedure: HERNIA REPAIR VENTRAL ADULT;  Surgeon: Jules Husbands, MD;  Location: [facility] ORS;  Service: General;  Laterality: N/A;     reports that he has been smoking. He has a 22.50 pack-year smoking history. He has never used smokeless tobacco. He reports current alcohol use of about 2.0 standard drinks per week. He reports that he does not use drugs.  Allergies  Allergen Reactions   Chantix [Varenicline]    Lexapro [Escitalopram]  Family History  Problem Relation Age of Onset   Hypertension Father    Heart disease Father        CABG x 3 & valve replacement.    Heart attack Father      Prior to Admission medications   Medication Sig Start Date End Date Taking? Authorizing Provider  FEROSUL 325 (65 Fe) MG tablet Take 325 mg by mouth every morning. 10/18/21  Yes [provider]  folic acid (FOLVITE) 1 MG tablet Take 1 mg by mouth daily. 11/14/21  Yes [provider]  furosemide (LASIX) 40 MG tablet Take 40 mg by mouth daily. 07/31/21  Yes [provider]  montelukast (SINGULAIR) 10 MG tablet Take 10 mg by mouth daily. 12/03/21  Yes [provider]  pantoprazole (PROTONIX) 40 MG tablet Take 1 tablet (40 mg total) by mouth 2 (two) times daily. 11/17/21  Yes Sharen Hones, MD  QUEtiapine (SEROQUEL) 25 MG tablet Take 25 mg by mouth at bedtime. 02/11/22  Yes [provider]  spironolactone (ALDACTONE) 100 MG tablet Take 100 mg by mouth daily. 07/31/21  Yes [provider]  thiamine 100 MG tablet Take 100 mg by mouth daily. 11/15/21  Yes [provider]    Physical Exam: Vitals:   02/22/22 1015 02/22/22 1030 02/22/22 1115 02/22/22 1230  BP: 121/76 119/80 (!) 130/91 121/87  Pulse: 91 98 (!) 102 (!) 101  Resp: '18 16 18 17  '$ Temp:      TempSrc:      SpO2: 92% 93% 94% 94%  Weight:      Height:        Constitutional: NAD, calm, comfortable Vitals:   02/22/22 1015 02/22/22 1030 02/22/22 1115 02/22/22 1230  BP: 121/76 119/80 (!) 130/91 121/87  Pulse: 91 98 (!) 102 (!) 101  Resp: '18 16 18 17  '$ Temp:      TempSrc:      SpO2: 92% 93% 94% 94%  Weight:      Height:       Eyes: PERRL, positive conjunctival icterus ENMT: Mucous membranes are moist. Posterior pharynx clear of any exudate or lesions.Normal dentition.  Neck: normal, supple, no masses, no thyromegaly Respiratory: clear to auscultation bilaterally, no wheezing, no crackles. Normal respiratory effort. No accessory muscle use.  Cardiovascular: Regular rate and rhythm, no murmurs / rubs / gallops. No extremity edema. 2+ pedal pulses. No carotid bruits.  Abdomen: no tenderness, no masses palpated. No hepatosplenomegaly. Bowel sounds positive.  Musculoskeletal: no clubbing / cyanosis. No joint deformity upper and lower extremities. Good ROM, no  contractures. Normal muscle tone.  Fine tremors on bilateral fingertips Skin: no rashes, lesions, ulcers. No induration Neurologic: CN 2-12 grossly intact. Sensation intact, DTR normal. Strength 5/5 in all 4.  Psychiatric: Normal judgment and insight. Alert and oriented x 3. Normal mood.    Labs on Admission: I have personally reviewed following labs and imaging studies  CBC: Recent Labs  Lab 02/22/22 0930  WBC 7.5  HGB 15.6  HCT 43.5  MCV 96.5  PLT 67*   Basic Metabolic Panel: Recent Labs  Lab 02/22/22 0930  NA 127*  K 2.9*  CL 84*  CO2 25  GLUCOSE 117*  BUN 7  CREATININE 0.84  CALCIUM 8.4*  MG 1.8  PHOS 3.2   GFR: Estimated Creatinine Clearance: 129.5 mL/min (by C-G formula based on SCr of 0.84 mg/dL). Liver Function Tests: Recent Labs  Lab 02/22/22 0930  AST 333*  ALT 141*  ALKPHOS 234*  BILITOT 6.4*  PROT 7.7  ALBUMIN 3.2*   Recent Labs  Lab 02/22/22 0930  LIPASE 89*   No results for input(s): AMMONIA in the last 168 hours. Coagulation Profile: No results for input(s): INR, PROTIME in the last 168 hours. Cardiac Enzymes: No results for input(s): CKTOTAL, CKMB, CKMBINDEX, TROPONINI in the last 168 hours. BNP (last 3 results) No results for input(s): PROBNP in the last 8760 hours. HbA1C: No results for input(s): HGBA1C in the last 72 hours. CBG: No results for input(s): GLUCAP in the last 168 hours. Lipid Profile: No results for input(s): CHOL, HDL, LDLCALC, TRIG, CHOLHDL, LDLDIRECT in the last 72 hours. Thyroid Function Tests: No results for input(s): TSH, T4TOTAL, FREET4, T3FREE, THYROIDAB in the last 72 hours. Anemia Panel: No results for input(s): VITAMINB12, FOLATE, FERRITIN, TIBC, IRON, RETICCTPCT in the last 72 hours. Urine analysis:    Component Value Date/Time   COLORURINE AMBER (A) 02/22/2022 1036   APPEARANCEUR HAZY (A) 02/22/2022 1036   APPEARANCEUR Clear 11/02/2013 1724   LABSPEC 1.032 (H) 02/22/2022 1036   LABSPEC 1.020  11/02/2013 1724   PHURINE 5.0 02/22/2022 1036   GLUCOSEU NEGATIVE 02/22/2022 1036   GLUCOSEU Negative 11/02/2013 1724   HGBUR SMALL (A) 02/22/2022 1036   BILIRUBINUR MODERATE (A) 02/22/2022 1036   BILIRUBINUR Negative 11/02/2013 1724   KETONESUR 20 (A) 02/22/2022 1036   PROTEINUR 30 (A) 02/22/2022 1036   NITRITE NEGATIVE 02/22/2022 1036   LEUKOCYTESUR NEGATIVE 02/22/2022 1036   LEUKOCYTESUR Negative 11/02/2013 1724    Radiological Exams on Admission: CT Abdomen Pelvis W Contrast  Result Date: 02/22/2022 CLINICAL DATA:  Abdominal pain EXAM: CT ABDOMEN AND PELVIS WITH CONTRAST TECHNIQUE: Multidetector CT imaging of the abdomen and pelvis was performed using the standard protocol following bolus administration of intravenous contrast. RADIATION DOSE REDUCTION: This exam was performed according to the departmental dose-optimization program which includes automated exposure control, adjustment of the mA and/or kV according to patient size and/or use of iterative reconstruction technique. CONTRAST:  125m OMNIPAQUE IOHEXOL 350 MG/ML SOLN COMPARISON:  01/17/2021 CT abdomen pelvis FINDINGS: Lower chest: No focal pulmonary opacity or pleural effusion. No pericardial effusion. Hepatobiliary: Diffusely decreased hepatic density with nodular contours, consistent with advancing cirrhosis with fibrosis. A hypoenhancing lesion is noted in the right hepatic lobe, measuring up to 2.2 cm (series 3, image 28) which is incompletely evaluated and was not definitively present on the prior exam. The hepatic and portal veins are patent. No intra or extrahepatic biliary ductal dilatation. The gallbladder is without wall thickening or cholelithiasis. Pancreas: Unremarkable. No pancreatic ductal dilatation or surrounding inflammatory changes. Spleen: The spleen is enlarged, measuring up to 14.6 cm in craniocaudal dimension. A splenule is noted. Adrenals/Urinary Tract: The adrenal glands are unremarkable. The kidneys enhance  symmetrically. No hydronephrosis or nephrolithiasis. The bladder is unremarkable for degree of distension. Stomach/Bowel: The stomach is within normal limits. At the site of the previously noted umbilical hernia, there may be adherent loops bowel, which do not appear obstructed. Wall thickening is noted in the ascending and proximal transverse colon (series 3, image 61), with mild pericolonic fat stranding. The bowel is otherwise without evidence of wall thickening or distention. Diverticulosis without evidence of diverticulitis. The appendix is surgically absent. Vascular/Lymphatic: Moderate aortic atherosclerosis. Again noted are prominent portacaval nodes, favored to be reactive. Reproductive: Prostate is unremarkable. Other: Previously noted periumbilical hernia has been surgically repaired, with scarring and possible local adhesion given adjacent bowel loops but no obstruction. There is also increased density  in this area secondary to venous collaterals. No significant free fluid. No free air in the abdomen or pelvis. Musculoskeletal: No acute or significant osseous findings. IMPRESSION: 1. Advancing cirrhosis with fibrosis, with a new 2.1 cm hypoenhancing lesion, which could represent washout in a hepatocellular carcinoma. A liver protocol MRI abdomen is recommended for further evaluation. 2. Colonic wall thickening and pericolonic stranding about the distal ascending and proximal transverse colon, which could represent infectious colitis, although colopathy from cirrhosis and portal vein hypertension can appear similar. Correlate with symptoms. 3. Status post interval umbilical hernia repair, with possible local adhesion given adjacent bowel loops but no evidence of obstruction. Electronically Signed   By: Merilyn Baba M.D.   On: 02/22/2022 12:28    EKG: Independently reviewed. Sinus tachy  Assessment/Plan Principal Problem:   Alcoholic hepatitis with ascites Active Problems:   Alcohol abuse with  withdrawal (HCC)   Alcohol dependence (HCC)   Alcoholic cirrhosis of liver with ascites (HCC)   Acute upper GI bleed  (please populate well all problems here in Problem List. (For example, if patient is on BP meds at home and you resume or decide to hold them, it is a problem that needs to be her. Same for CAD, COPD, HLD and so on)  Upper GI bleeding -Suspect Mallory-Weiss. EGD 3 months ago showed no significant bleeding source other than portal hypertensive related gastropathy but no varices. -Double dose PPI for now -Discussed with [facility] -Hemodynamically stable with H&H within normal limits, recheck H&H tonight and tomorrow. -No indication for Sandostatin drip as recent EGD showed no signs of varices  Alcohol withdrawal -S/S of active withdraw -Start CIWA protocol  Decompensated alcoholic cirrhosis -Appears to have increased ascites, ordered IR paracentesis. -Synthetic liver function preserved with mild decreased albumin level and normal INR. -Long discussion with patient and his mother at bedside regarding importance of staying abstinence from alcohol and following up with liver specialist. -Child-Pugh class B (9 points) -Ascites signs hard to evaluate given his body girth, consult IR for possible paracentesis  Acute on chronic alcoholic hepatitis -With worsening of transaminitis and bilirubinemia -Maddrey's discriminant function=8.7, no indication for steroid. -Recheck LFTs in AM  Hypokalemia -IV replacement, recheck level tomorrow  Hyponatremia -Chronic, euvolemic, secondary to alcohol abuse.  New hypodensity liver lesion -Check AFP -Outpatient follow-up with hematologist and outpatient MRI.  Cigarette smoke -Cessation education consulted, nicotine patch for now  Obesity -Calory control recommended.   DVT prophylaxis: SCD Family Communication: Mother at bedside Disposition Plan: Patient is sick with multiple active medical problems according alcoholic  withdrawal, worsening of alcoholic hepatitis and GI bleed, expect more than 2 midnight hospital stay Consults called: [facility] Admission status: Tele admit   Lequita Halt MD [facility] Pager [phone]  02/22/2022, 1:00 PM

## 2022-02-22 NOTE — ED Provider Notes (Signed)
[facility] EMERGENCY DEPARTMENT Provider Note   CSN: [id number] Arrival date & time: 02/22/22  0900     History  Chief Complaint  Patient presents with   Hematemesis   Alcohol Problem    Omar Turner is a 51 y.o. male.  HPI History of alcohol abuse and dependence, incarcerated ventral hernia, compensated liver cirrhosis, history of abdominal ascites, GERD.  Upper endoscopy 2\10\2023 done at [facility] by Dr. Virgina Jock. - Normal duodenal bulb, first portion of the duodenum and second portion of the duodenum. - Portal hypertensive gastropathy. - Gastritis. - LA Grade A esophagitis with no bleeding. - Z-line regular. - No specimens collected.  Patient reports he relapsed with alcohol consumption.  That was approximately 1 month ago.  He reports he is drinking about 1/5 to half a gallon of liquor a day.  Last alcohol use was about 10 hours ago.  Patient reports he is going to withdrawal.  He can feel tremors.  Feels anxious.  Patient reports he has been vomiting most days.  He reports the vomitus was bloody.  Patient's mother is with him at bedside.  She reports she saw a lot of bloody vomit today.  Patient reports he is also having blood in his bowel movement in his urine.  His mother observes that his abdomen is distended.  Patient reports he has a lot of burning and sharp pain in his upper abdomen.    Home Medications      Allergies    Chantix [varenicline] and Lexapro [escitalopram]    Review of Systems   Review of Systems 10 systems reviewed and negative except as per HPI Physical Exam Updated Vital Signs BP (!) 130/91   Pulse (!) 102   Temp 98.7 F (37.1 C) (Oral)   Resp 18   Ht '5\' 11"'$  (1.803 m)   Wt 99.8 kg   SpO2 94%   BMI 30.68 kg/m  Physical Exam Constitutional:      Comments: Patient is alert and nontoxic.  He is well-groomed.  He is clinically well in appearance.  No confusion.  Patient is not obviously tremulous as he is  interacting in the stretcher.  He is not responding to any external stimuli.  He is not showing evidence of delirium.  HENT:     Head: Normocephalic and atraumatic.     Mouth/Throat:     Mouth: Mucous membranes are moist.     Pharynx: Oropharynx is clear.  Eyes:     Extraocular Movements: Extraocular movements intact.     Conjunctiva/sclera: Conjunctivae normal.     Pupils: Pupils are equal, round, and reactive to light.  Cardiovascular:     Rate and Rhythm: Normal rate and regular rhythm.  Pulmonary:     Effort: Pulmonary effort is normal.     Breath sounds: Normal breath sounds.  Abdominal:     Comments: Abdomen soft no guarding.  Patient endorses tenderness in the right upper quadrant and epigastrium to palpation.  Abdomen is slightly obese but soft without distention or obvious ascites.  Normal hair pattern and skin quality on the abdomen, and no hemangiomas or dilated abdominal wall vessels.   Genitourinary:    Comments: Rectal exam was for trace brownish-yellow stool in the vault.  No melena and no red blood.  Prostate normal without obvious enlargement. Musculoskeletal:        General: No swelling or tenderness. Normal range of motion.     Cervical back: Neck supple.  Right lower leg: No edema.     Left lower leg: No edema.     Comments: Lower extremities are in good condition.  No peripheral edema.  Normal hair patterns.  No wounds or cellulitis.  Good distal perfusion  Skin:    General: Skin is warm and dry.  Neurological:     General: No focal deficit present.     Mental Status: He is oriented to person, place, and time.     Coordination: Coordination normal.     Comments: At this time, patient shows no signs of confusion.  He is alert.  His speech is clear and normal.  He is not tremulous to observation as we are conducting the normal exam.  All movements are coordinated purposeful and symmetric.  Psychiatric:        Mood and Affect: Mood normal.    ED Results /  Procedures / Treatments   Labs (all labs ordered are listed, but only abnormal results are displayed) Labs Reviewed  LIPASE, BLOOD - Abnormal; Notable for the following components:      Result Value   Lipase 89 (*)    All other components within normal limits  COMPREHENSIVE METABOLIC PANEL - Abnormal; Notable for the following components:   Sodium 127 (*)    Potassium 2.9 (*)    Chloride 84 (*)    Glucose, Bld 117 (*)    Calcium 8.4 (*)    Albumin 3.2 (*)    AST 333 (*)    ALT 141 (*)    Alkaline Phosphatase 234 (*)    Total Bilirubin 6.4 (*)    Anion gap 18 (*)    All other components within normal limits  CBC - Abnormal; Notable for the following components:   MCH 34.6 (*)    Platelets 67 (*)    All other components within normal limits  URINALYSIS, ROUTINE W REFLEX MICROSCOPIC - Abnormal; Notable for the following components:   Color, Urine AMBER (*)    APPearance HAZY (*)    Specific Gravity, Urine 1.032 (*)    Hgb urine dipstick SMALL (*)    Bilirubin Urine MODERATE (*)    Ketones, ur 20 (*)    Protein, ur 30 (*)    Bacteria, UA RARE (*)    All other components within normal limits  ETHANOL - Abnormal; Notable for the following components:   Alcohol, Ethyl (B) 156 (*)    All other components within normal limits  POC OCCULT BLOOD, ED - Abnormal; Notable for the following components:   Fecal Occult Bld POSITIVE (*)    All other components within normal limits  RAPID URINE DRUG SCREEN, HOSP PERFORMED  MAGNESIUM  PHOSPHORUS    EKG EKG Interpretation  Date/Time:  Friday Feb 22 2022 09:21:45 EDT Ventricular Rate:  98 PR Interval:  160 QRS Duration: 103 QT Interval:  405 QTC Calculation: 518 R Axis:   -26 Text Interpretation: Sinus rhythm Borderline left axis deviation no sig change from previous. no acute ischemic appearance Confirmed by Charlesetta Shanks [phone]) on 02/22/2022 10:06:49 AM  Radiology No results found.  Procedures Procedures   CRITICAL  CARE Performed by: Charlesetta Shanks   Total critical care time: 30 minutes  Critical care time was exclusive of separately billable procedures and treating other patients.  Critical care was necessary to treat or prevent imminent or life-threatening deterioration.  Critical care was time spent personally by me on the following activities: development of treatment plan with patient and/or surrogate  as well as nursing, discussions with consultants, evaluation of patient's response to treatment, examination of patient, obtaining history from patient or surrogate, ordering and performing treatments and interventions, ordering and review of laboratory studies, ordering and review of radiographic studies, pulse oximetry and re-evaluation of patient's condition.  Medications Ordered in ED Medications  0.9 %  sodium chloride infusion (has no administration in time range)  potassium chloride 10 mEq in 100 mL IVPB (has no administration in time range)  LORazepam (ATIVAN) injection 2 mg (has no administration in time range)  ondansetron (ZOFRAN) injection 4 mg (4 mg Intravenous Given 02/22/22 0953)  pantoprazole (PROTONIX) injection 40 mg (40 mg Intravenous Given 02/22/22 1003)  sodium chloride 0.9 % bolus 1,000 mL (1,000 mLs Intravenous New Bag/Given 02/22/22 0957)  iohexol (OMNIPAQUE) 350 MG/ML injection 100 mL (100 mLs Intravenous Contrast Given 02/22/22 1150)    ED Course/ Medical Decision Making/ A&P                           Medical Decision Making Amount and/or Complexity of Data Reviewed Labs: ordered. Radiology: ordered.  Risk Prescription drug management. Decision regarding hospitalization.   Patient presents as outlined.  He describes blood in emesis, stool and urine.  Will need to proceed with diagnostic evaluation with lab work and monitoring.  At this time patient is clinically stable.  He has normal blood pressure and heart rate.  Rectal exam is normal without any melena or red  blood.  We will proceed with fluid resuscitation and Protonix administration as well as Zofran for nausea.  Patient reports he is in withdrawal and is requesting medication for withdrawal.  At this time, objectively patient is not showing signs of significant physiological withdrawal.  His mental status is clear, he does not have significant tremor.  Patient is normotensive.  Reports last drink was about 10 hours ago.  We will continue with rehydration and nausea control.  We will continue to observe closely and monitor CIWA scores.  Patient reports the nurse increased feeling of anxiety and pain.  Ativan IV added.  I personally examined and reviewed CT scan.  I do not see significant amount of ascites.  Patient does have hepatic enlargement.  I do not appreciate any evident areas of significant stranding.  Aorta without significant enlargement.  Awaiting radiology CT read.  Labs reviewed.  Patient is hypokalemic at 2.9.  His hemoglobin is stable at 15.6, possible hemoconcentrated.  Alcohol is 154.  Patient has significant increase in total bilirubin at 6  Fluids and potassium replaced.  Patient treated with Protonix.  Patient will be admitted.   Consult: Reviewed with Dr. Roosevelt Locks for admission.       Final Clinical Impression(s) / ED Diagnoses Final diagnoses:  Hematemesis, unspecified whether nausea present  Alcohol abuse  Acute liver failure without hepatic coma  Hypokalemia    Rx / DC Orders ED Discharge Orders     None         Charlesetta Shanks, MD 02/22/22 1221

## 2022-02-22 NOTE — Consult Note (Signed)
Referring Provider: [facility] Primary Care Physician:  Adin Hector, MD Primary Gastroenterologist:  Dr. Jonathon Bellows with [facility]  Reason for Consultation:  ETOH hepatitis/cirrhosis  HPI: Omar Turner is a 51 y.o. male with medical history significant of cirrhosis secondary to alcohol abuse with portal hypertension, history of ascites, alcoholic hepatitis, GERD, ventral hernia repair recently, recent GI bleed s/p EGD in February who presented to [facility] ED with nausea and vomiting, hematemesis, and alcohol withdrawal symptoms.  He follows with GI in [place].  Continues to drink throughout the day and night apparently with last drink at 2 AM and already concern for withdrawal.  ETOH level 156 this AM.  Says that he sees blood intermittently in his stool and his emesis.  Has intermittent diarrhea, but none recently.  Is hemoccult positive.  INR 1.2.  Hgb 15.6 grams, platelets 67.  Sodium 127 potassium 3.9, Creatinine 0.84, AST 333 ALT 141, alk phos 234, total bili 6.4.  CT scan of the abdomen and pelvis with contrast:  IMPRESSION: 1. Advancing cirrhosis with fibrosis, with a new 2.1 cm hypoenhancing lesion, which could represent washout in a hepatocellular carcinoma. A liver protocol MRI abdomen is recommended for further evaluation. 2. Colonic wall thickening and pericolonic stranding about the distal ascending and proximal transverse colon, which could represent infectious colitis, although colopathy from cirrhosis and portal vein hypertension can appear similar. Correlate with symptoms. 3. Status post interval umbilical hernia repair, with possible local adhesion given adjacent bowel loops but no evidence of obstruction.  EGD 11/2021:  - Normal duodenal bulb, first portion of the duodenum and second portion of the duodenum. - Portal hypertensive gastropathy. - Gastritis. - LA Grade A esophagitis with no bleeding. - Z-line regular. - No specimens  collected.  Colonoscopy 05/2018:  - Two 3 to 4 mm polyps in the ascending colon, removed with a cold biopsy forceps. Resected and retrieved. - Two 4 to 6 mm polyps in the transverse colon and in the ascending colon, removed with a cold snare. Resected and retrieved. - Four 5 to 7 mm polyps in the rectum, removed with a cold snare. Resected and retrieved. - Two 4 to 6 mm polyps in the sigmoid colon, removed with a cold snare. Resected and retrieved. - Three 8 to 10 mm polyps in the sigmoid colon, removed with a hot snare. Resected and retrieved. - Diverticulosis in the sigmoid colon. - The examination was otherwise normal on direct and retroflexion views.  A. COLON POLYPS X3, ASCENDING; COLD SNARE X1 AND COLD BIOPSY X2:  - TUBULAR ADENOMA (MULTIPLE FRAGMENTS).  - NEGATIVE FOR HIGH-GRADE DYSPLASIA AND MALIGNANCY.   B.  COLON POLYP, TRANSVERSE; COLD SNARE:  - TUBULAR ADENOMA.  - NEGATIVE FOR HIGH-GRADE DYSPLASIA AND MALIGNANCY.   C.  COLON POLYPS X5, SIGMOID; HOT SNARE X3 AND COLD SNARE X2:  - TUBULAR ADENOMA (1).  - SERRATED POLYP FAVOR SESSILE SERRATED ADENOMA (MULTIPLE FRAGMENTS).  - NEGATIVE FOR HIGH-GRADE DYSPLASIA AND MALIGNANCY.   D.  RECTUM POLYPS X4; COLD SNARE:  - HYPERPLASTIC POLYP (MULTIPLE FRAGMENTS).  - NEGATIVE FOR DYSPLASIA AND MALIGNANCY.    Past Medical History:  Diagnosis Date   Anginal pain ([place])    Anxiety    Degenerative disc disease, cervical    Diverticulitis large intestine    GERD (gastroesophageal reflux disease)    History of chest pain    Hyperlipidemia    Hypertension    Sleep apnea    Tobacco abuse  Past Surgical History:  Procedure Laterality Date   APPENDECTOMY     CARDIAC CATHETERIZATION Left 06/17/2016   Procedure: Left Heart Cath and Coronary Angiography;  Surgeon: Wellington Hampshire, MD;  Location: [facility];  Service: Cardiovascular;  Laterality: Left;   COLONOSCOPY     COLONOSCOPY WITH PROPOFOL N/A 09/07/2015    Procedure: COLONOSCOPY WITH PROPOFOL;  Surgeon: Hulen Luster, MD;  Location: [facility] ENDOSCOPY;  Service: Gastroenterology;  Laterality: N/A;   COLONOSCOPY WITH PROPOFOL N/A 05/12/2018   Procedure: COLONOSCOPY WITH PROPOFOL;  Surgeon: Jonathon Bellows, MD;  Location: [facility] ENDOSCOPY;  Service: Gastroenterology;  Laterality: N/A;   ESOPHAGOGASTRODUODENOSCOPY (EGD) WITH PROPOFOL N/A 11/16/2021   Procedure: ESOPHAGOGASTRODUODENOSCOPY (EGD) WITH PROPOFOL;  Surgeon: Annamaria Helling, DO;  Location: [facility];  Service: Gastroenterology;  Laterality: N/A;   HERNIA REPAIR     IR RADIOLOGIST EVAL & MGMT  02/01/2021   VENTRAL HERNIA REPAIR N/A 01/17/2021   Procedure: HERNIA REPAIR VENTRAL ADULT;  Surgeon: Jules Husbands, MD;  Location: [facility] ORS;  Service: General;  Laterality: N/A;    Prior to Admission medications   Medication Sig Start Date End Date Taking? Authorizing Provider  FEROSUL 325 (65 Fe) MG tablet Take 325 mg by mouth every morning. 10/18/21  Yes [provider]  folic acid (FOLVITE) 1 MG tablet Take 1 mg by mouth daily. 11/14/21  Yes [provider]  furosemide (LASIX) 40 MG tablet Take 40 mg by mouth daily. 07/31/21  Yes [provider]  montelukast (SINGULAIR) 10 MG tablet Take 10 mg by mouth daily. 12/03/21  Yes [provider]  pantoprazole (PROTONIX) 40 MG tablet Take 1 tablet (40 mg total) by mouth 2 (two) times daily. 11/17/21  Yes Sharen Hones, MD  QUEtiapine (SEROQUEL) 25 MG tablet Take 25 mg by mouth at bedtime. 02/11/22  Yes [provider]  spironolactone (ALDACTONE) 100 MG tablet Take 100 mg by mouth daily. 07/31/21  Yes [provider]  thiamine 100 MG tablet Take 100 mg by mouth daily. 11/15/21  Yes [provider]    Current Facility-Administered Medications  Medication Dose Route Frequency Provider Last Rate Last Admin   ferrous sulfate tablet 325 mg  325 mg Oral q morning Wynetta Fines T, MD   325 mg at 99/37/16 9678    folic acid (FOLVITE) tablet 1 mg  1 mg Oral Daily Wynetta Fines T, MD   1 mg at 02/22/22 1336   furosemide (LASIX) tablet 40 mg  40 mg Oral Daily Wynetta Fines T, MD   40 mg at 02/22/22 1336   LORazepam (ATIVAN) tablet 1-4 mg  1-4 mg Oral Q1H PRN Lequita Halt, MD       Or   LORazepam (ATIVAN) injection 1-4 mg  1-4 mg Intravenous Q1H PRN Wynetta Fines T, MD       LORazepam (ATIVAN) tablet 0-4 mg  0-4 mg Oral Q6H Wynetta Fines T, MD       Followed by   Derrill Memo ON 02/24/2022] LORazepam (ATIVAN) tablet 0-4 mg  0-4 mg Oral Q12H Wynetta Fines T, MD       montelukast (SINGULAIR) tablet 10 mg  10 mg Oral Daily Wynetta Fines T, MD       multivitamin with minerals tablet 1 tablet  1 tablet Oral Daily Wynetta Fines T, MD   1 tablet at 02/22/22 1336   nicotine (NICODERM CQ - dosed in mg/24 hours) patch 21 mg  21 mg Transdermal Daily Zhang, Ping T,  MD       ondansetron (ZOFRAN) injection 4 mg  4 mg Intravenous Q6H PRN Mikey College T, MD       pantoprazole (PROTONIX) EC tablet 40 mg  40 mg Oral BID AC Zhang, Ilda Foil T, MD       potassium chloride 10 mEq in 100 mL IVPB  10 mEq Intravenous Q1 Hr x 4 Pfeiffer, Marcy, MD 100 mL/hr at 02/22/22 1336 10 mEq at 02/22/22 1336   QUEtiapine (SEROQUEL) tablet 25 mg  25 mg Oral QHS Mikey College T, MD       spironolactone (ALDACTONE) tablet 100 mg  100 mg Oral Daily Mikey College T, MD       thiamine tablet 100 mg  100 mg Oral Daily Mikey College T, MD   100 mg at 02/22/22 1336   Or   thiamine (B-1) injection 100 mg  100 mg Intravenous Daily Mikey College T, MD       Current Outpatient Medications  Medication Sig Dispense Refill   FEROSUL 325 (65 Fe) MG tablet Take 325 mg by mouth every morning.     folic acid (FOLVITE) 1 MG tablet Take 1 mg by mouth daily.     furosemide (LASIX) 40 MG tablet Take 40 mg by mouth daily.     montelukast (SINGULAIR) 10 MG tablet Take 10 mg by mouth daily.     pantoprazole (PROTONIX) 40 MG tablet Take 1 tablet (40 mg total) by mouth 2 (two) times daily. 60 tablet  1   QUEtiapine (SEROQUEL) 25 MG tablet Take 25 mg by mouth at bedtime.     spironolactone (ALDACTONE) 100 MG tablet Take 100 mg by mouth daily.     thiamine 100 MG tablet Take 100 mg by mouth daily.      Allergies as of 02/22/2022 - Review Complete 02/22/2022  Allergen Reaction Noted   Chantix [varenicline]  09/06/2015   Lexapro [escitalopram]  09/06/2015    Family History  Problem Relation Age of Onset   Hypertension Father    Heart disease Father        CABG x 3 & valve replacement.    Heart attack Father     Social History   Socioeconomic History   Marital status: Single    Spouse name: Not on file   Number of children: Not on file   Years of education: Not on file   Highest education level: Not on file  Occupational History   Not on file  Tobacco Use   Smoking status: Every Day    Packs/day: 1.50    Years: 15.00    Pack years: 22.50    Types: Cigarettes   Smokeless tobacco: Never  Vaping Use   Vaping Use: Never used  Substance and Sexual Activity   Alcohol use: Yes    Alcohol/week: 2.0 standard drinks    Types: 2 Shots of liquor per week    Comment: per day   Drug use: No   Sexual activity: Not on file  Other Topics Concern   Not on file  Social History Narrative   Not on file   Social Determinants of Health   Financial Resource Strain: Not on file  Food Insecurity: Not on file  Transportation Needs: Not on file  Physical Activity: Not on file  Stress: Not on file  Social Connections: Not on file  Intimate Partner Violence: Not on file    Review of Systems: ROS is O/W negative except as mentioned in HPI.  Physical Exam: Vital signs in last 24 hours: Temp:  [98.7 F (37.1 C)] 98.7 F (37.1 C) (05/19 0921) Pulse Rate:  [91-102] 101 (05/19 1230) Resp:  [15-18] 17 (05/19 1230) BP: (119-146)/(76-92) 121/87 (05/19 1230) SpO2:  [92 %-96 %] 94 % (05/19 1230) Weight:  [99.8 kg] 99.8 kg (05/19 0923)   General:  Alert, Well-developed,  well-nourished, pleasant and cooperative in NAD Head:  Normocephalic and atraumatic. Eyes:  Scleral icterus noted. Ears:  Normal auditory acuity. Mouth:  No deformity or lesions.   Lungs:  Clear throughout to auscultation.  No wheezes, crackles, or rhonchi.  Heart:  Regular rate and rhythm; no murmurs, clicks, rubs, or gallops. Abdomen:  Soft, non-distended.  BS present.  Mild upper abdominal TTP, Rectal:  Deferred.  Heme positive by EDP.  Msk:  Symmetrical without gross deformities. Pulses:  Normal pulses noted. Extremities:  Without clubbing or edema. Neurologic:  Alert and oriented x 4;  grossly normal neurologically.  Slightly tremulous. Skin:  Intact without significant lesions or rashes. Psych:  Alert and cooperative. Normal mood and affect.  Lab Results: Recent Labs    02/22/22 0930  WBC 7.5  HGB 15.6  HCT 43.5  PLT 67*   BMET Recent Labs    02/22/22 0930  NA 127*  K 2.9*  CL 84*  CO2 25  GLUCOSE 117*  BUN 7  CREATININE 0.84  CALCIUM 8.4*   LFT Recent Labs    02/22/22 0930  PROT 7.7  ALBUMIN 3.2*  AST 333*  ALT 141*  ALKPHOS 234*  BILITOT 6.4*   PT/INR Recent Labs    02/22/22 0930  LABPROT 15.5*  INR 1.2   Studies/Results: CT Abdomen Pelvis W Contrast  Result Date: 02/22/2022 CLINICAL DATA:  Abdominal pain EXAM: CT ABDOMEN AND PELVIS WITH CONTRAST TECHNIQUE: Multidetector CT imaging of the abdomen and pelvis was performed using the standard protocol following bolus administration of intravenous contrast. RADIATION DOSE REDUCTION: This exam was performed according to the departmental dose-optimization program which includes automated exposure control, adjustment of the mA and/or kV according to patient size and/or use of iterative reconstruction technique. CONTRAST:  149mL OMNIPAQUE IOHEXOL 350 MG/ML SOLN COMPARISON:  01/17/2021 CT abdomen pelvis FINDINGS: Lower chest: No focal pulmonary opacity or pleural effusion. No pericardial effusion.  Hepatobiliary: Diffusely decreased hepatic density with nodular contours, consistent with advancing cirrhosis with fibrosis. A hypoenhancing lesion is noted in the right hepatic lobe, measuring up to 2.2 cm (series 3, image 28) which is incompletely evaluated and was not definitively present on the prior exam. The hepatic and portal veins are patent. No intra or extrahepatic biliary ductal dilatation. The gallbladder is without wall thickening or cholelithiasis. Pancreas: Unremarkable. No pancreatic ductal dilatation or surrounding inflammatory changes. Spleen: The spleen is enlarged, measuring up to 14.6 cm in craniocaudal dimension. A splenule is noted. Adrenals/Urinary Tract: The adrenal glands are unremarkable. The kidneys enhance symmetrically. No hydronephrosis or nephrolithiasis. The bladder is unremarkable for degree of distension. Stomach/Bowel: The stomach is within normal limits. At the site of the previously noted umbilical hernia, there may be adherent loops bowel, which do not appear obstructed. Wall thickening is noted in the ascending and proximal transverse colon (series 3, image 61), with mild pericolonic fat stranding. The bowel is otherwise without evidence of wall thickening or distention. Diverticulosis without evidence of diverticulitis. The appendix is surgically absent. Vascular/Lymphatic: Moderate aortic atherosclerosis. Again noted are prominent portacaval nodes, favored to be reactive. Reproductive: Prostate is unremarkable. Other: Previously noted periumbilical  hernia has been surgically repaired, with scarring and possible local adhesion given adjacent bowel loops but no obstruction. There is also increased density in this area secondary to venous collaterals. No significant free fluid. No free air in the abdomen or pelvis. Musculoskeletal: No acute or significant osseous findings. IMPRESSION: 1. Advancing cirrhosis with fibrosis, with a new 2.1 cm hypoenhancing lesion, which could  represent washout in a hepatocellular carcinoma. A liver protocol MRI abdomen is recommended for further evaluation. 2. Colonic wall thickening and pericolonic stranding about the distal ascending and proximal transverse colon, which could represent infectious colitis, although colopathy from cirrhosis and portal vein hypertension can appear similar. Correlate with symptoms. 3. Status post interval umbilical hernia repair, with possible local adhesion given adjacent bowel loops but no evidence of obstruction. Electronically Signed   By: Merilyn Baba M.D.   On: 02/22/2022 12:28    IMPRESSION:  *Component of acute alcohol hepatitis on chronic decompensated cirrhosis: Discriminant function only approximately 16.5, so no need for steroids.  MELD Na+ is 23.  Still actively using alcohol with alcohol level of 156 this morning.  Drinks continuously throughout the day so now that he has not had and ETOH is several hours feels that he is having withdrawals.  Is on Lasix 40 mg daily and spironolactone 100 mg daily at home.  Paracentesis was ordered here, but no ascites was seen on CT scan. *Hyponatremia with sodium 127 *Hypokalemia with potassium 2.9 *New liver lesion seen on CT scan: Question HCC.  AFP has been ordered and is pending.  Will need MRI at some point to further characterize this lesion. *Hematemesis, Hemoccult positive:  Sounds like this has been occurring intermittently for a while.  Hemoglobin completely normal at 15.6 grams.  He just had an EGD in February with portal hypertensive gastropathy, but no varices.  Currently on pantoprazole 40 mg p.o. twice daily at home and here as well. *Colonic wall thickening and pericolonic stranding about the distal ascending and proximal transverse colon seen on CT scan, which could represent infectious colitis, although colopathy from cirrhosis and portal vein hypertension can appear similar.  He denies any recent diarrhea so I would favor colopathy related to  cirrhosis. *Personal history of multiple tubular adenoma colon polyps: Overdue for surveillance colonoscopy.  Repeat was recommended in 3 years, August 2022.  PLAN: -Would hold diuretics for now especially since no ascites seen on CT scan.  Electrolyte derangements to be corrected by primary service. -Will need MRI at some point to further characterize liver lesion.  AFP pending. -Needs complete ETOH cessation. -Trend labs. -Agree with pantoprazole 40 mg PO BID for now given no varices on EGD in February. -No need for repeat EGD for now. -CIWA protocol.  Laban Emperor. Avenly Roberge  02/22/2022, 2:04 PM

## 2022-02-23 ENCOUNTER — Inpatient Hospital Stay (HOSPITAL_COMMUNITY): Payer: BC Managed Care – PPO

## 2022-02-23 DIAGNOSIS — K7200 Acute and subacute hepatic failure without coma: Secondary | ICD-10-CM

## 2022-02-23 DIAGNOSIS — K7011 Alcoholic hepatitis with ascites: Secondary | ICD-10-CM | POA: Diagnosis not present

## 2022-02-23 DIAGNOSIS — F10139 Alcohol abuse with withdrawal, unspecified: Secondary | ICD-10-CM

## 2022-02-23 DIAGNOSIS — F1010 Alcohol abuse, uncomplicated: Secondary | ICD-10-CM | POA: Diagnosis not present

## 2022-02-23 DIAGNOSIS — K922 Gastrointestinal hemorrhage, unspecified: Secondary | ICD-10-CM

## 2022-02-23 LAB — CBC
HCT: 38.8 % — ABNORMAL LOW (ref 39.0–52.0)
Hemoglobin: 13.9 g/dL (ref 13.0–17.0)
MCH: 34.7 pg — ABNORMAL HIGH (ref 26.0–34.0)
MCHC: 35.8 g/dL (ref 30.0–36.0)
MCV: 96.8 fL (ref 80.0–100.0)
Platelets: 45 10*3/uL — ABNORMAL LOW (ref 150–400)
RBC: 4.01 MIL/uL — ABNORMAL LOW (ref 4.22–5.81)
RDW: 14.4 % (ref 11.5–15.5)
WBC: 7.1 10*3/uL (ref 4.0–10.5)
nRBC: 0 % (ref 0.0–0.2)

## 2022-02-23 LAB — COMPREHENSIVE METABOLIC PANEL
ALT: 117 U/L — ABNORMAL HIGH (ref 0–44)
AST: 276 U/L — ABNORMAL HIGH (ref 15–41)
Albumin: 2.8 g/dL — ABNORMAL LOW (ref 3.5–5.0)
Alkaline Phosphatase: 180 U/L — ABNORMAL HIGH (ref 38–126)
Anion gap: 13 (ref 5–15)
BUN: 10 mg/dL (ref 6–20)
CO2: 25 mmol/L (ref 22–32)
Calcium: 8.3 mg/dL — ABNORMAL LOW (ref 8.9–10.3)
Chloride: 91 mmol/L — ABNORMAL LOW (ref 98–111)
Creatinine, Ser: 0.78 mg/dL (ref 0.61–1.24)
GFR, Estimated: >60 mL/min (ref >60)
Glucose, Bld: 88 mg/dL (ref 70–99)
Potassium: 3.2 mmol/L — ABNORMAL LOW (ref 3.5–5.1)
Sodium: 129 mmol/L — ABNORMAL LOW (ref 135–145)
Total Bilirubin: 8.5 mg/dL — ABNORMAL HIGH (ref 0.3–1.2)
Total Protein: 7.1 g/dL (ref 6.5–8.1)

## 2022-02-23 LAB — PROTIME-INR
INR: 1.3 — ABNORMAL HIGH (ref 0.8–1.2)
Prothrombin Time: 16.3 seconds — ABNORMAL HIGH (ref 11.4–15.2)

## 2022-02-23 MED ORDER — POTASSIUM CHLORIDE CRYS ER 20 MEQ PO TBCR
40.0000 meq | EXTENDED_RELEASE_TABLET | Freq: Once | ORAL | Status: AC
Start: 2022-02-23 — End: 2022-02-23
  Administered 2022-02-23: 40 meq via ORAL
  Filled 2022-02-23: qty 2

## 2022-02-23 MED ORDER — GADOBUTROL 1 MMOL/ML IV SOLN
10.0000 mL | Freq: Once | INTRAVENOUS | Status: AC | PRN
  Administered 2022-02-23: 10 mL via INTRAVENOUS

## 2022-02-23 MED ORDER — LACTULOSE 10 GM/15ML PO SOLN
10.0000 g | Freq: Two times a day (BID) | ORAL | Status: DC
  Administered 2022-02-23 – 2022-02-25 (×5): 10 g via ORAL
  Filled 2022-02-23 (×6): qty 15

## 2022-02-23 MED ORDER — ACETAMINOPHEN 325 MG PO TABS
650.0000 mg | ORAL_TABLET | Freq: Four times a day (QID) | ORAL | Status: DC | PRN
  Administered 2022-02-23 – 2022-02-24 (×2): 650 mg via ORAL
  Filled 2022-02-23 (×2): qty 2

## 2022-02-23 MED ORDER — HYDROXYZINE HCL 25 MG PO TABS
25.0000 mg | ORAL_TABLET | Freq: Three times a day (TID) | ORAL | Status: DC | PRN
  Administered 2022-02-23 – 2022-02-24 (×3): 25 mg via ORAL
  Filled 2022-02-23 (×3): qty 1

## 2022-02-23 NOTE — Progress Notes (Signed)
Progress Note   Subjective  Chief Complaint: Alcoholic hepatitis/cirrhosis  Today, the patient has a lot of complaints.  He has had some nausea, dizziness, headache and felt anxious.  Also has a lot of questions in regards to recent CT and MRI which she just got back from.  Denies any acute changes overnight.   Objective   Vital signs in last 24 hours: Temp:  [98.6 F (37 C)-99.3 F (37.4 C)] 98.6 F (37 C) (05/20 0452) Pulse Rate:  [101-109] 108 (05/20 0452) Resp:  [16-20] 18 (05/20 0452) BP: (121-146)/(82-110) 133/85 (05/20 0452) SpO2:  [93 %-98 %] 94 % (05/20 0452) Last BM Date : 02/22/22 General:    white male in NAD Heart:  Regular rate and rhythm; no murmurs Lungs: Respirations even and unlabored, lungs CTA bilaterally Abdomen:  Soft, moderate RUQ ttp and nondistended. Normal bowel sounds. Psych:  Cooperative. Normal mood and affect.  Intake/Output from previous day: 05/19 0701 - 05/20 0700 In: 600 [P.O.:600] Out: 100 [Urine:100]   Lab Results: Recent Labs    02/22/22 0930 02/22/22 2138 02/23/22 0324  WBC 7.5  --  7.1  HGB 15.6 14.8 13.9  HCT 43.5 40.6 38.8*  PLT 67*  --  45*   BMET Recent Labs    02/22/22 0930 02/23/22 0324  NA 127* 129*  K 2.9* 3.2*  CL 84* 91*  CO2 25 25  GLUCOSE 117* 88  BUN 7 10  CREATININE 0.84 0.78  CALCIUM 8.4* 8.3*      Latest Ref Rng & Units 02/23/2022    3:24 AM 02/22/2022    9:30 AM 11/16/2021    6:22 AM  Hepatic Function  Total Protein 6.5 - 8.1 g/dL 7.1   7.7   7.0    Albumin 3.5 - 5.0 g/dL 2.8   3.2   3.7    AST 15 - 41 U/L 276   333   256    ALT 0 - 44 U/L 117   141   181    Alk Phosphatase 38 - 126 U/L 180   234   100    Total Bilirubin 0.3 - 1.2 mg/dL 8.5   6.4   2.4      PT/INR Recent Labs    02/22/22 0930  LABPROT 15.5*  INR 1.2    Studies/Results: CT Abdomen Pelvis W Contrast  Result Date: 02/22/2022 CLINICAL DATA:  Abdominal pain EXAM: CT ABDOMEN AND PELVIS WITH CONTRAST TECHNIQUE:  Multidetector CT imaging of the abdomen and pelvis was performed using the standard protocol following bolus administration of intravenous contrast. RADIATION DOSE REDUCTION: This exam was performed according to the departmental dose-optimization program which includes automated exposure control, adjustment of the mA and/or kV according to patient size and/or use of iterative reconstruction technique. CONTRAST:  OMNIPAQUE IOHEXOL 350 MG/ML SOLN COMPARISON:  01/17/2021 CT abdomen pelvis FINDINGS: Lower chest: No focal pulmonary opacity or pleural effusion. No pericardial effusion. Hepatobiliary: Diffusely decreased hepatic density with nodular contours, consistent with advancing cirrhosis with fibrosis. A hypoenhancing lesion is noted in the right hepatic lobe, measuring up to 2.2 cm (series 3, image 28) which is incompletely evaluated and was not definitively present on the prior exam. The hepatic and portal veins are patent. No intra or extrahepatic biliary ductal dilatation. The gallbladder is without wall thickening or cholelithiasis. Pancreas: Unremarkable. No pancreatic ductal dilatation or surrounding inflammatory changes. Spleen: The spleen is enlarged, measuring up to 14.6 cm in craniocaudal dimension. A splenule is  noted. Adrenals/Urinary Tract: The adrenal glands are unremarkable. The kidneys enhance symmetrically. No hydronephrosis or nephrolithiasis. The bladder is unremarkable for degree of distension. Stomach/Bowel: The stomach is within normal limits. At the site of the previously noted umbilical hernia, there may be adherent loops bowel, which do not appear obstructed. Wall thickening is noted in the ascending and proximal transverse colon (series 3, image 61), with mild pericolonic fat stranding. The bowel is otherwise without evidence of wall thickening or distention. Diverticulosis without evidence of diverticulitis. The appendix is surgically absent. Vascular/Lymphatic: Moderate aortic  atherosclerosis. Again noted are prominent portacaval nodes, favored to be reactive. Reproductive: Prostate is unremarkable. Other: Previously noted periumbilical hernia has been surgically repaired, with scarring and possible local adhesion given adjacent bowel loops but no obstruction. There is also increased density in this area secondary to venous collaterals. No significant free fluid. No free air in the abdomen or pelvis. Musculoskeletal: No acute or significant osseous findings. IMPRESSION: 1. Advancing cirrhosis with fibrosis, with a new 2.1 cm hypoenhancing lesion, which could represent washout in a hepatocellular carcinoma. A liver protocol MRI abdomen is recommended for further evaluation. 2. Colonic wall thickening and pericolonic stranding about the distal ascending and proximal transverse colon, which could represent infectious colitis, although colopathy from cirrhosis and portal vein hypertension can appear similar. Correlate with symptoms. 3. Status post interval umbilical hernia repair, with possible local adhesion given adjacent bowel loops but no evidence of obstruction. Electronically Signed   By: Merilyn Baba M.D.   On: 02/22/2022 12:28   MR LIVER W WO CONTRAST  Result Date: 02/23/2022 CLINICAL DATA:  51 year old male with history of cirrhosis and indeterminate liver lesion noted on recent CT examination. Follow-up study. EXAM: MRI ABDOMEN WITHOUT AND WITH CONTRAST TECHNIQUE: Multiplanar multisequence MR imaging of the abdomen was performed both before and after the administration of intravenous contrast. CONTRAST:  44mL GADAVIST GADOBUTROL 1 MMOL/ML IV SOLN COMPARISON:  No prior abdominal MRI. CT the abdomen and pelvis 02/22/2022. FINDINGS: Lower chest: Unremarkable. Hepatobiliary: Liver has a very nodular contour indicative of underlying cirrhosis. There is severe but heterogeneous loss of signal intensity throughout the hepatic parenchyma on out of phase dual echo images, indicative of  a background of profound hepatic steatosis. Several small nodular areas are noted scattered throughout the hepatic parenchyma which are T1 hypointense and T2 hypointense, likely to represent regenerative nodules. No hypervascular hepatic lesion is confidently identified to clearly indicate the presence of hepatocellular carcinoma at this time. No intra or extrahepatic biliary ductal dilatation. Gallbladder is moderately distended. Gallbladder wall appears mildly edematous. No filling defect in the gallbladder to suggest gallstones. No intra or extrahepatic biliary ductal dilatation. Common bile duct measures 2 mm in the porta hepatis. No filling defect in the common bile duct to suggest choledocholithiasis. Pancreas: No pancreatic mass. No pancreatic ductal dilatation. No pancreatic or peripancreatic fluid collections or inflammatory changes. Spleen: Spleen is enlarged measuring 12.8 x 7.7 x 14.8 cm (estimated splenic volume of 729 mL) . Adrenals/Urinary Tract: Bilateral kidneys and adrenal glands are normal in appearance. No hydroureteronephrosis in the visualized portions of the abdomen. Stomach/Bowel: Visualized portions are unremarkable. Vascular/Lymphatic: No aneurysm identified in the visualized abdominal vasculature. Circumaortic left renal vein (normal anatomical variant) incidentally noted. Portal vein is upper limits of normal (14 mm in diameter). No lymphadenopathy noted in the abdomen. Other:  Trace volume of perihepatic ascites.  No pneumoperitoneum. Musculoskeletal: No aggressive appearing osseous lesions are noted in the visualized portions of the skeleton. IMPRESSION:  1. Severe hepatic steatosis and hepatic cirrhosis, as above. This is associated with portal venous hypertension, as demonstrated by borderline dilated portal vein and splenomegaly. No suspicious hepatic lesions are noted at this time to indicate hepatocellular carcinoma. 2. Trace volume of perihepatic ascites. 3. Gallbladder wall  appears edematous. This is favored to be related to intrinsic hepatic disease. No gallstones are noted to suggest an acute cholecystitis at this time. Electronically Signed   By: Vinnie Langton M.D.   On: 02/23/2022 10:08   DG CHEST PORT 1 VIEW  Result Date: 02/22/2022 CLINICAL DATA:  History of cirrhosis with bloody stools, initial encounter EXAM: PORTABLE CHEST 1 VIEW COMPARISON:  10/08/2019 FINDINGS: The heart size and mediastinal contours are within normal limits. Both lungs are clear. The visualized skeletal structures are unremarkable. IMPRESSION: No active disease. Electronically Signed   By: Inez Catalina M.D.   On: 02/22/2022 17:17     Assessment / Plan:   Assessment: 1.  Acute alcoholic hepatitis on chronic decompensated cirrhosis: DF 16.5, MELD Na+ is 23, still actively using alcohol, with alcohol level of 156 at admission, on Lasix 40 daily and Spironolactone 100 daily at home, CT admission with question of liver lesion but follow-up MRI this morning with no sign of HCC 2.  Hyponatremia/hypokalemia 3.  Liver lesion: On CT with question of Electra with follow-up MRI with no sign of HCC 4.  Hematemesis/Hemoccult positive: Has been occurring intermittently for a while, hemoglobin normal, EGD in February portal hypertensive gastropathy, but no varices, currently on Pantoprazole 40 twice daily 5.  Colonic wall thickening: Could represent infectious colitis versus colopathy from cirrhosis and portal vein hypertension, diarrhea overnight thought related to Lactulose 6.  Personal history of adenomatous polyps: Overdue for a surveillance colonoscopy, repeat recommended in August 2022  Plan: 1.  Hold diuretics given no ascites 2.  Electrolyte derangements to be corrected by primary service 3.  MRI with no sign of HCC, no need for further evaluation of this 4.  Needs complete alcohol cessation 5.  Continue to trend labs 6.  Added daily PT/INR for monitoring 7.  Continue Pantoprazole 40 twice  daily 8.  Added Tylenol every 6 hours for headache which she is experiencing 9.  Discussed his anxiety, he has Ativan ordered for him, told him discussed with nursing staff  Thank you for your kind consultation, we will continue to follow along.    LOS: 1 day   Levin Erp  02/23/2022, 11:40 AM

## 2022-02-23 NOTE — Progress Notes (Signed)
[facility]                                                                               Omar Turner, is a 51 y.o. male, DOB - 25-Jul-1971, MEQ:[id number] Admit date - 02/22/2022    Outpatient Primary MD for the patient is Adin Hector, MD  LOS - 1  days    Brief summary   Omar Turner is a 51 y.o. male with medical history significant of cirrhosis secondary to alcohol abuse with portal hypertension, ascites, alcoholic hepatitis, GERD, ventral hernia repair, recent GI bleed s/p EGD, came with repeated nauseous vomiting, hematemesis and alcohol withdrawal symptoms. Patient started to drink alcohol about 1 month ago, with 1/4-1/2 gallon of Vodka daily and starting 2 weeks ago, patient started to have frequent nauseous, retching and occasional vomiting associated with drinking.  He was admitted for evaluation of upper GI bleed.      Assessment & Plan    Assessment and Plan:   Upper GI bleeding, possibly either Mallory weiss tear vs portal hypertensive gastropathy.  - continue with PPI bid for now.  - GI on board.  - underwent EGD 3 months ago.  Hemoglobin appears to be stable around 13.9 this am dropped from 15.6.  No more episodes of nausea, vomiting, or hematemesis. But he reports bright red blood per rectum.  Stool for occult blood is positive.  Awaiting recommendations from gi regarding the timing of EGD and colonoscopy.      Decompensated alcoholic cirrhosis with alcoholic hepatitis Underwent MRI of the liver showing . Severe hepatic steatosis and hepatic cirrhosis, portal hypertension and splenomegaly.  Elevated liver enzymes with hyperbilirubinemia.  Bilirubin is 8.5 this afternoon.  INR IS 1.3. GI on board and currently on lasix and spironolactone.    Hyponatremia: from fluid overload and alcohol use.    Alcohol Use disorder:  Mild DT'S today.  On CIWA, Atarax added.  On thiamine and folic acid.    Hypokalemia  Replaced.     Tobacco abuse On nicotine patch.       Estimated body mass index is 30.68 kg/m as calculated from the following:   Height as of this encounter: '5\' 11"'$  (1.803 m).   Weight as of this encounter: 99.8 kg.  Code Status: full code.  DVT Prophylaxis:  SCDs Start: 02/22/22 1256   Level of Care: Level of care: Telemetry Medical Family Communication: Updated patient's family at bedside.   Disposition Plan:     Remains inpatient appropriate:  for BRBPR, alcohol withdrawals  Procedures:  None.  Consultants:   Gastroenterology.   Antimicrobials:   Anti-infectives (From admission, onward)    None        Medications  Scheduled Meds:  ferrous sulfate  325 mg Oral q morning   folic acid  1 mg Oral Daily   furosemide  40 mg Oral Daily   lactulose  10 g Oral BID   LORazepam  0-4 mg Oral Q6H   Followed by   Derrill Memo ON 02/24/2022] LORazepam  0-4 mg Oral Q12H   montelukast  10 mg Oral Daily   multivitamin with minerals  1 tablet Oral Daily  nicotine  21 mg Transdermal Daily   pantoprazole  40 mg Oral BID AC   QUEtiapine  25 mg Oral QHS   spironolactone  100 mg Oral Daily   thiamine  100 mg Oral Daily   Or   thiamine  100 mg Intravenous Daily   Continuous Infusions: PRN Meds:.acetaminophen, hydrOXYzine, LORazepam **OR** LORazepam, ondansetron (ZOFRAN) IV    Subjective:   Omar Turner was seen and examined today.  Pt reports he has tremors. Requesting medication for anxiety.   Objective:   Vitals:   02/23/22 0109 02/23/22 0109 02/23/22 0452 02/23/22 1200  BP: 129/82 129/82 133/85 123/79  Pulse: (!) 106 (!) 106 (!) 108 97  Resp:  '20 18 18  '$ Temp:  98.8 F (37.1 C) 98.6 F (37 C) 98.6 F (37 C)  TempSrc:   Oral Oral  SpO2:  95% 94% 95%  Weight:      Height:        Intake/Output Summary (Last 24 hours) at 02/23/2022 1619 Last data filed at 02/23/2022 1200 Gross per 24 hour  Intake 650 ml  Output 100 ml  Net 550 ml   Filed Weights   02/22/22 0923   Weight: 99.8 kg     Exam General exam: Appears calm and comfortable  Respiratory system: Clear to auscultation. Respiratory effort normal. Cardiovascular system: S1 & S2 heard, RRR. No JVD,No pedal edema. Gastrointestinal system: Abdomen is soft, distended, mildly tender in the right upper quadrant. Central nervous system: Alert and oriented. No focal neurological deficits. Extremities: Symmetric 5 x 5 power. Skin: No rashes, lesions or ulcers Psychiatry: anxious.    Data Reviewed:  I have personally reviewed following labs and imaging studies   CBC Lab Results  Component Value Date   WBC 7.1 02/23/2022   RBC 4.01 (L) 02/23/2022   HGB 13.9 02/23/2022   HCT 38.8 (L) 02/23/2022   MCV 96.8 02/23/2022   MCH 34.7 (H) 02/23/2022   PLT 45 (L) 02/23/2022   MCHC 35.8 02/23/2022   RDW 14.4 02/23/2022   LYMPHSABS 2.3 10/28/2019   MONOABS 0.5 10/28/2019   EOSABS 0.5 10/28/2019   BASOSABS 0.0 62/83/1517     Last metabolic panel Lab Results  Component Value Date   NA 129 (L) 02/23/2022   K 3.2 (L) 02/23/2022   CL 91 (L) 02/23/2022   CO2 25 02/23/2022   BUN 10 02/23/2022   CREATININE 0.78 02/23/2022   GLUCOSE 88 02/23/2022   GFRNONAA >60 02/23/2022   GFRAA >60 10/28/2019   CALCIUM 8.3 (L) 02/23/2022   PHOS 3.2 02/22/2022   PROT 7.1 02/23/2022   ALBUMIN 2.8 (L) 02/23/2022   BILITOT 8.5 (H) 02/23/2022   ALKPHOS 180 (H) 02/23/2022   AST 276 (H) 02/23/2022   ALT 117 (H) 02/23/2022   ANIONGAP 13 02/23/2022    CBG (last 3)  No results for input(s): GLUCAP in the last 72 hours.    Coagulation Profile: Recent Labs  Lab 02/22/22 0930 02/23/22 1236  INR 1.2 1.3*     Radiology Studies: CT Abdomen Pelvis W Contrast  Result Date: 02/22/2022 CLINICAL DATA:  Abdominal pain EXAM: CT ABDOMEN AND PELVIS WITH CONTRAST TECHNIQUE: Multidetector CT imaging of the abdomen and pelvis was performed using the standard protocol following bolus administration of intravenous  contrast. RADIATION DOSE REDUCTION: This exam was performed according to the departmental dose-optimization program which includes automated exposure control, adjustment of the mA and/or kV according to patient size and/or use of iterative reconstruction technique. CONTRAST:  148m OMNIPAQUE IOHEXOL 350 MG/ML SOLN COMPARISON:  01/17/2021 CT abdomen pelvis FINDINGS: Lower chest: No focal pulmonary opacity or pleural effusion. No pericardial effusion. Hepatobiliary: Diffusely decreased hepatic density with nodular contours, consistent with advancing cirrhosis with fibrosis. A hypoenhancing lesion is noted in the right hepatic lobe, measuring up to 2.2 cm (series 3, image 28) which is incompletely evaluated and was not definitively present on the prior exam. The hepatic and portal veins are patent. No intra or extrahepatic biliary ductal dilatation. The gallbladder is without wall thickening or cholelithiasis. Pancreas: Unremarkable. No pancreatic ductal dilatation or surrounding inflammatory changes. Spleen: The spleen is enlarged, measuring up to 14.6 cm in craniocaudal dimension. A splenule is noted. Adrenals/Urinary Tract: The adrenal glands are unremarkable. The kidneys enhance symmetrically. No hydronephrosis or nephrolithiasis. The bladder is unremarkable for degree of distension. Stomach/Bowel: The stomach is within normal limits. At the site of the previously noted umbilical hernia, there may be adherent loops bowel, which do not appear obstructed. Wall thickening is noted in the ascending and proximal transverse colon (series 3, image 61), with mild pericolonic fat stranding. The bowel is otherwise without evidence of wall thickening or distention. Diverticulosis without evidence of diverticulitis. The appendix is surgically absent. Vascular/Lymphatic: Moderate aortic atherosclerosis. Again noted are prominent portacaval nodes, favored to be reactive. Reproductive: Prostate is unremarkable. Other: Previously  noted periumbilical hernia has been surgically repaired, with scarring and possible local adhesion given adjacent bowel loops but no obstruction. There is also increased density in this area secondary to venous collaterals. No significant free fluid. No free air in the abdomen or pelvis. Musculoskeletal: No acute or significant osseous findings. IMPRESSION: 1. Advancing cirrhosis with fibrosis, with a new 2.1 cm hypoenhancing lesion, which could represent washout in a hepatocellular carcinoma. A liver protocol MRI abdomen is recommended for further evaluation. 2. Colonic wall thickening and pericolonic stranding about the distal ascending and proximal transverse colon, which could represent infectious colitis, although colopathy from cirrhosis and portal vein hypertension can appear similar. Correlate with symptoms. 3. Status post interval umbilical hernia repair, with possible local adhesion given adjacent bowel loops but no evidence of obstruction. Electronically Signed   By: AMerilyn BabaM.D.   On: 02/22/2022 12:28   MR LIVER W WO CONTRAST  Result Date: 02/23/2022 CLINICAL DATA:  51year old male with history of cirrhosis and indeterminate liver lesion noted on recent CT examination. Follow-up study. EXAM: MRI ABDOMEN WITHOUT AND WITH CONTRAST TECHNIQUE: Multiplanar multisequence MR imaging of the abdomen was performed both before and after the administration of intravenous contrast. CONTRAST:  159mGADAVIST GADOBUTROL 1 MMOL/ML IV SOLN COMPARISON:  No prior abdominal MRI. CT the abdomen and pelvis 02/22/2022. FINDINGS: Lower chest: Unremarkable. Hepatobiliary: Liver has a very nodular contour indicative of underlying cirrhosis. There is severe but heterogeneous loss of signal intensity throughout the hepatic parenchyma on out of phase dual echo images, indicative of a background of profound hepatic steatosis. Several small nodular areas are noted scattered throughout the hepatic parenchyma which are T1  hypointense and T2 hypointense, likely to represent regenerative nodules. No hypervascular hepatic lesion is confidently identified to clearly indicate the presence of hepatocellular carcinoma at this time. No intra or extrahepatic biliary ductal dilatation. Gallbladder is moderately distended. Gallbladder wall appears mildly edematous. No filling defect in the gallbladder to suggest gallstones. No intra or extrahepatic biliary ductal dilatation. Common bile duct measures 2 mm in the porta hepatis. No filling defect in the common bile duct to suggest choledocholithiasis. Pancreas: No  pancreatic mass. No pancreatic ductal dilatation. No pancreatic or peripancreatic fluid collections or inflammatory changes. Spleen: Spleen is enlarged measuring 12.8 x 7.7 x 14.8 cm (estimated splenic volume of 729 mL) . Adrenals/Urinary Tract: Bilateral kidneys and adrenal glands are normal in appearance. No hydroureteronephrosis in the visualized portions of the abdomen. Stomach/Bowel: Visualized portions are unremarkable. Vascular/Lymphatic: No aneurysm identified in the visualized abdominal vasculature. Circumaortic left renal vein (normal anatomical variant) incidentally noted. Portal vein is upper limits of normal (14 mm in diameter). No lymphadenopathy noted in the abdomen. Other:  Trace volume of perihepatic ascites.  No pneumoperitoneum. Musculoskeletal: No aggressive appearing osseous lesions are noted in the visualized portions of the skeleton. IMPRESSION: 1. Severe hepatic steatosis and hepatic cirrhosis, as above. This is associated with portal venous hypertension, as demonstrated by borderline dilated portal vein and splenomegaly. No suspicious hepatic lesions are noted at this time to indicate hepatocellular carcinoma. 2. Trace volume of perihepatic ascites. 3. Gallbladder wall appears edematous. This is favored to be related to intrinsic hepatic disease. No gallstones are noted to suggest an acute cholecystitis at this  time. Electronically Signed   By: Vinnie Langton M.D.   On: 02/23/2022 10:08   DG CHEST PORT 1 VIEW  Result Date: 02/22/2022 CLINICAL DATA:  History of cirrhosis with bloody stools, initial encounter EXAM: PORTABLE CHEST 1 VIEW COMPARISON:  10/08/2019 FINDINGS: The heart size and mediastinal contours are within normal limits. Both lungs are clear. The visualized skeletal structures are unremarkable. IMPRESSION: No active disease. Electronically Signed   By: Inez Catalina M.D.   On: 02/22/2022 17:17       Hosie Poisson M.D. [facility] 02/23/2022, 4:19 PM  Available via Epic secure chat 7am-7pm After 7 pm, please refer to night coverage provider listed on amion.

## 2022-02-24 DIAGNOSIS — K7200 Acute and subacute hepatic failure without coma: Secondary | ICD-10-CM | POA: Diagnosis not present

## 2022-02-24 DIAGNOSIS — K922 Gastrointestinal hemorrhage, unspecified: Secondary | ICD-10-CM | POA: Diagnosis not present

## 2022-02-24 DIAGNOSIS — K7011 Alcoholic hepatitis with ascites: Secondary | ICD-10-CM | POA: Diagnosis not present

## 2022-02-24 DIAGNOSIS — F1010 Alcohol abuse, uncomplicated: Secondary | ICD-10-CM | POA: Diagnosis not present

## 2022-02-24 LAB — COMPREHENSIVE METABOLIC PANEL
ALT: 102 U/L — ABNORMAL HIGH (ref 0–44)
AST: 235 U/L — ABNORMAL HIGH (ref 15–41)
Albumin: 2.8 g/dL — ABNORMAL LOW (ref 3.5–5.0)
Alkaline Phosphatase: 197 U/L — ABNORMAL HIGH (ref 38–126)
Anion gap: 10 (ref 5–15)
BUN: 10 mg/dL (ref 6–20)
CO2: 27 mmol/L (ref 22–32)
Calcium: 8.7 mg/dL — ABNORMAL LOW (ref 8.9–10.3)
Chloride: 93 mmol/L — ABNORMAL LOW (ref 98–111)
Creatinine, Ser: 0.94 mg/dL (ref 0.61–1.24)
GFR, Estimated: >60 mL/min (ref >60)
Glucose, Bld: 121 mg/dL — ABNORMAL HIGH (ref 70–99)
Potassium: 3.4 mmol/L — ABNORMAL LOW (ref 3.5–5.1)
Sodium: 130 mmol/L — ABNORMAL LOW (ref 135–145)
Total Bilirubin: 9.7 mg/dL — ABNORMAL HIGH (ref 0.3–1.2)
Total Protein: 7.5 g/dL (ref 6.5–8.1)

## 2022-02-24 LAB — PROTIME-INR
INR: 1.3 — ABNORMAL HIGH (ref 0.8–1.2)
Prothrombin Time: 15.7 seconds — ABNORMAL HIGH (ref 11.4–15.2)

## 2022-02-24 LAB — AFP TUMOR MARKER: AFP, Serum, Tumor Marker: 4.2 ng/mL (ref 0.0–6.9)

## 2022-02-24 MED ORDER — POTASSIUM CHLORIDE CRYS ER 20 MEQ PO TBCR
40.0000 meq | EXTENDED_RELEASE_TABLET | Freq: Once | ORAL | Status: AC
  Administered 2022-02-24: 40 meq via ORAL
  Filled 2022-02-24: qty 2

## 2022-02-24 NOTE — Progress Notes (Signed)
Progress Note   Subjective  Chief Complaint: Alcoholic hepatitis/cirrhosis  This morning, patient feels about the same.  He does tell me he had decreased amount of bowel movements yesterday when we decrease his Lactulose, he may be had 5 loose stools.  Continues with a headache and some anxiety and tells me he is started with some "shaking".   Objective   Vital signs in last 24 hours: Temp:  [98.5 F (36.9 C)-99.1 F (37.3 C)] 98.7 F (37.1 C) (05/21 0836) Pulse Rate:  [98-109] 102 (05/21 0836) Resp:  [16-18] 16 (05/21 0836) BP: (111-128)/(76-79) 118/76 (05/21 0836) SpO2:  [94 %-96 %] 96 % (05/21 0836) Last BM Date : 02/23/22 General:    white male in NAD Heart:  Regular rate and rhythm; no murmurs Lungs: Respirations even and unlabored, lungs CTA bilaterally Abdomen:  Soft, moderate RUQ ttp and nondistended. Normal bowel sounds. Psych:  Cooperative. Normal mood and affect.  Intake/Output from previous day: 05/20 0701 - 05/21 0700 In: 74 [P.O.:890] Out: -    Lab Results: Recent Labs    02/22/22 0930 02/22/22 2138 02/23/22 0324  WBC 7.5  --  7.1  HGB 15.6 14.8 13.9  HCT 43.5 40.6 38.8*  PLT 67*  --  45*   BMET Recent Labs    02/22/22 0930 02/23/22 0324 02/24/22 1020  NA 127* 129* 130*  K 2.9* 3.2* 3.4*  CL 84* 91* 93*  CO2 _0 GLUCOSE 117* 88 121*  BUN _1 CREATININE 0.84 0.78 0.94  CALCIUM 8.4* 8.3* 8.7*      Latest Ref Rng & Units 02/24/2022   10:20 AM 02/23/2022    3:24 AM 02/22/2022    9:30 AM  Hepatic Function  Total Protein 6.5 - 8.1 g/dL 7.5   7.1   7.7    Albumin 3.5 - 5.0 g/dL 2.8   2.8   3.2    AST 15 - 41 U/L 235   276   333    ALT 0 - 44 U/L 102   117   141    Alk Phosphatase 38 - 126 U/L 197   180   234    Total Bilirubin 0.3 - 1.2 mg/dL 9.7   8.5   6.4       PT/INR Recent Labs    02/23/22 1236 02/24/22 0101  LABPROT 16.3* 15.7*  INR 1.3* 1.3*    Studies/Results: MR LIVER W WO CONTRAST  Result Date:  02/23/2022 CLINICAL DATA:  51 year old male with history of cirrhosis and indeterminate liver lesion noted on recent CT examination. Follow-up study. EXAM: MRI ABDOMEN WITHOUT AND WITH CONTRAST TECHNIQUE: Multiplanar multisequence MR imaging of the abdomen was performed both before and after the administration of intravenous contrast. CONTRAST:  81m GADAVIST GADOBUTROL 1 MMOL/ML IV SOLN COMPARISON:  No prior abdominal MRI. CT the abdomen and pelvis 02/22/2022. FINDINGS: Lower chest: Unremarkable. Hepatobiliary: Liver has a very nodular contour indicative of underlying cirrhosis. There is severe but heterogeneous loss of signal intensity throughout the hepatic parenchyma on out of phase dual echo images, indicative of a background of profound hepatic steatosis. Several small nodular areas are noted scattered throughout the hepatic parenchyma which are T1 hypointense and T2 hypointense, likely to represent regenerative nodules. No hypervascular hepatic lesion is confidently identified to clearly indicate the presence of hepatocellular carcinoma at this time. No intra or extrahepatic biliary ductal dilatation. Gallbladder is moderately distended. Gallbladder wall appears mildly edematous. No filling defect  in the gallbladder to suggest gallstones. No intra or extrahepatic biliary ductal dilatation. Common bile duct measures 2 mm in the porta hepatis. No filling defect in the common bile duct to suggest choledocholithiasis. Pancreas: No pancreatic mass. No pancreatic ductal dilatation. No pancreatic or peripancreatic fluid collections or inflammatory changes. Spleen: Spleen is enlarged measuring 12.8 x 7.7 x 14.8 cm (estimated splenic volume of 729 mL) . Adrenals/Urinary Tract: Bilateral kidneys and adrenal glands are normal in appearance. No hydroureteronephrosis in the visualized portions of the abdomen. Stomach/Bowel: Visualized portions are unremarkable. Vascular/Lymphatic: No aneurysm identified in the visualized  abdominal vasculature. Circumaortic left renal vein (normal anatomical variant) incidentally noted. Portal vein is upper limits of normal (14 mm in diameter). No lymphadenopathy noted in the abdomen. Other:  Trace volume of perihepatic ascites.  No pneumoperitoneum. Musculoskeletal: No aggressive appearing osseous lesions are noted in the visualized portions of the skeleton. IMPRESSION: 1. Severe hepatic steatosis and hepatic cirrhosis, as above. This is associated with portal venous hypertension, as demonstrated by borderline dilated portal vein and splenomegaly. No suspicious hepatic lesions are noted at this time to indicate hepatocellular carcinoma. 2. Trace volume of perihepatic ascites. 3. Gallbladder wall appears edematous. This is favored to be related to intrinsic hepatic disease. No gallstones are noted to suggest an acute cholecystitis at this time. Electronically Signed   By: Vinnie Langton M.D.   On: 02/23/2022 10:08   DG CHEST PORT 1 VIEW  Result Date: 02/22/2022 CLINICAL DATA:  History of cirrhosis with bloody stools, initial encounter EXAM: PORTABLE CHEST 1 VIEW COMPARISON:  10/08/2019 FINDINGS: The heart size and mediastinal contours are within normal limits. Both lungs are clear. The visualized skeletal structures are unremarkable. IMPRESSION: No active disease. Electronically Signed   By: Inez Catalina M.D.   On: 02/22/2022 17:17       Assessment / Plan:   Assessment: 1.  Acute alcoholic hepatitis on chronic decompensated cirrhosis: DF 16.5, MELD Na+ is 23, still actively using alcohol with an alcohol level of 156 at admission, on Lasix 40 daily and spironolactone 100 daily at home, CT admission with question of liver lesion but follow-up MRI with no sign of HCC, PT/INR slowly trending down overnight but bilirubin remains elevated 2.  Hyponatremia/hypokalemia 3.  Hematemesis/Hemoccult positive: Has been occurring intermittently for a while, hemoglobin normal, EGD in February with  portal hypertensive gastropathy and no varices, currently on Pantoprazole 40 twice daily 4.  Colonic wall thickening: Thought to likely represent colopathy from cirrhosis 5.  Personal history of adenomatous polyps: Overdue for surveillance colonoscopy, repeat had been recommended in August 22 6. Thrombocytopenia  Plan: 1.  Continue to trend daily labs 2.  Continue Pantoprazole 40 twice daily 3.  Continue to monitor PT/INR 4.  Continue Lactulose 10 g twice daily for now, we will monitor his stools today and could decrease this tomorrow pending results 5.  Again could consider inpatient colonoscopy later during this hospitalization versus outpatient colonoscopy  Thank you for your kind consultation, we will continue to follow along.   LOS: 2 days   Levin Erp  02/24/2022, 12:13 PM

## 2022-02-24 NOTE — Plan of Care (Signed)
  Problem: Health Behavior/Discharge Planning: Goal: Ability to manage health-related needs will improve 02/24/2022 0202 by Henrine Screws, RN Outcome: Progressing 02/24/2022 0202 by Greggory Stallion, Thurston Hole, RN Outcome: Progressing   Problem: Clinical Measurements: Goal: Will remain free from infection 02/24/2022 0202 by Henrine Screws, RN Outcome: Progressing 02/24/2022 0202 by Henrine Screws, RN Outcome: Progressing   Problem: Nutrition: Goal: Adequate nutrition will be maintained 02/24/2022 0202 by Henrine Screws, RN Outcome: Progressing 02/24/2022 0202 by Henrine Screws, RN Outcome: Progressing   Problem: Coping: Goal: Level of anxiety will decrease 02/24/2022 0202 by Henrine Screws, RN Outcome: Progressing 02/24/2022 0202 by Henrine Screws, RN Outcome: Progressing   Problem: Pain Managment: Goal: General experience of comfort will improve 02/24/2022 0202 by Henrine Screws, RN Outcome: Progressing 02/24/2022 0202 by Henrine Screws, RN Outcome: Progressing

## 2022-02-24 NOTE — Progress Notes (Signed)
[facility]                                                                               Aryn Kops, is a 51 y.o. male, DOB - 1971-01-12, JOI:[id number] Admit date - 02/22/2022    Outpatient Primary MD for the patient is Adin Hector, MD  LOS - 2  days    Brief summary   Omar Turner is a 51 y.o. male with medical history significant of cirrhosis secondary to alcohol abuse with portal hypertension, ascites, alcoholic hepatitis, GERD, ventral hernia repair, recent GI bleed s/p EGD, came with repeated nauseous vomiting, hematemesis and alcohol withdrawal symptoms. Patient started to drink alcohol about 1 month ago, with 1/4-1/2 gallon of Vodka daily and starting 2 weeks ago, patient started to have frequent nauseous, retching and occasional vomiting associated with drinking.  He was admitted for evaluation of upper GI bleed.      Assessment & Plan    Assessment and Plan:   Upper GI bleeding, possibly either Mallory weiss tear vs portal hypertensive gastropathy.  - continue with PPI bid for now.  - GI on board.  - underwent EGD 3 months ago.  Hemoglobin appears to be stable around 13.9 this am dropped from 15.6.  No more episodes of nausea, vomiting, or hematemesis. But he reports bright red blood per rectum.  Stool for occult blood is positive.  Pt reports loose bowel movements with specks of blood earlier this am. Recheck H&H in am.     Decompensated alcoholic cirrhosis with alcoholic hepatitis Underwent MRI of the liver showing Severe hepatic steatosis and hepatic cirrhosis, portal hypertension and splenomegaly.  Elevated liver enzymes with hyperbilirubinemia.  Though liver enzymes are improving, bilirubin increased from 8.5 to 9.7 this afternoon.  INR IS 1.3. GI on board and currently on lasix and spironolactone.    Hyponatremia: from fluid overload and alcohol use. Sodium has improved to 130 with diuresis.  Watch for hepato renal syndrome.      Alcohol Use disorder:  Improving DT'S , some asterixis today.  On CIWA, Atarax added.  On thiamine and folic acid.    Hypokalemia  Replaced.    Tobacco abuse On nicotine patch.       Estimated body mass index is 30.68 kg/m as calculated from the following:   Height as of this encounter: '5\' 11"'$  (1.803 m).   Weight as of this encounter: 99.8 kg.  Code Status: full code.  DVT Prophylaxis:  SCDs Start: 02/22/22 1256   Level of Care: Level of care: Telemetry Medical Family Communication: none at bedside.   Disposition Plan:     Remains inpatient appropriate:  for BRBPR, alcohol withdrawals  Procedures:  None.  Consultants:   Gastroenterology.   Antimicrobials:   Anti-infectives (From admission, onward)    None        Medications  Scheduled Meds:  ferrous sulfate  325 mg Oral q morning   folic acid  1 mg Oral Daily   furosemide  40 mg Oral Daily   lactulose  10 g Oral BID   LORazepam  0-4 mg Oral Q12H   montelukast  10 mg Oral  Daily   multivitamin with minerals  1 tablet Oral Daily   nicotine  21 mg Transdermal Daily   pantoprazole  40 mg Oral BID AC   potassium chloride  40 mEq Oral Once   QUEtiapine  25 mg Oral QHS   spironolactone  100 mg Oral Daily   thiamine  100 mg Oral Daily   Or   thiamine  100 mg Intravenous Daily   Continuous Infusions: PRN Meds:.acetaminophen, hydrOXYzine, LORazepam **OR** LORazepam, ondansetron (ZOFRAN) IV    Subjective:   Omar Turner was seen and examined today.  Reports having bowel movements. Alert and oriented,   Objective:   Vitals:   02/23/22 1943 02/24/22 0051 02/24/22 0429 02/24/22 0836  BP: 111/76 122/79 118/77 118/76  Pulse: (!) 109 (!) 103 98 (!) 102  Resp: '18 18 18 16  '$ Temp: 99.1 F (37.3 C) 98.7 F (37.1 C) 98.5 F (36.9 C) 98.7 F (37.1 C)  TempSrc: Oral Oral Oral Oral  SpO2: 96% 94% 95% 96%  Weight:      Height:        Intake/Output Summary (Last 24 hours) at 02/24/2022 1450 Last  data filed at 02/24/2022 1223 Gross per 24 hour  Intake 1180 ml  Output --  Net 1180 ml    Filed Weights   02/22/22 0923  Weight: 99.8 kg     Exam General exam: Appears calm and comfortable  Respiratory system: Clear to auscultation. Respiratory effort normal. Cardiovascular system: S1 & S2 heard, RRR. No JVD, murmurs, rubs, gallops or clicks. No pedal edema. Gastrointestinal system: Abdomen is soft, mild RUQ  tender , bowel sounds wnl.  Central nervous system: Alert and oriented. No focal neurological deficits. Extremities: Symmetric 5 x 5 power. Skin: No rashes, lesions or ulcers Psychiatry: Mood & affect appropriate.    Data Reviewed:  I have personally reviewed following labs and imaging studies   CBC Lab Results  Component Value Date   WBC 7.1 02/23/2022   RBC 4.01 (L) 02/23/2022   HGB 13.9 02/23/2022   HCT 38.8 (L) 02/23/2022   MCV 96.8 02/23/2022   MCH 34.7 (H) 02/23/2022   PLT 45 (L) 02/23/2022   MCHC 35.8 02/23/2022   RDW 14.4 02/23/2022   LYMPHSABS 2.3 10/28/2019   MONOABS 0.5 10/28/2019   EOSABS 0.5 10/28/2019   BASOSABS 0.0 42/70/6237     Last metabolic panel Lab Results  Component Value Date   NA 130 (L) 02/24/2022   K 3.4 (L) 02/24/2022   CL 93 (L) 02/24/2022   CO2 27 02/24/2022   BUN 10 02/24/2022   CREATININE 0.94 02/24/2022   GLUCOSE 121 (H) 02/24/2022   GFRNONAA >60 02/24/2022   GFRAA >60 10/28/2019   CALCIUM 8.7 (L) 02/24/2022   PHOS 3.2 02/22/2022   PROT 7.5 02/24/2022   ALBUMIN 2.8 (L) 02/24/2022   BILITOT 9.7 (H) 02/24/2022   ALKPHOS 197 (H) 02/24/2022   AST 235 (H) 02/24/2022   ALT 102 (H) 02/24/2022   ANIONGAP 10 02/24/2022    CBG (last 3)  No results for input(s): GLUCAP in the last 72 hours.    Coagulation Profile: Recent Labs  Lab 02/22/22 0930 02/23/22 1236 02/24/22 0101  INR 1.2 1.3* 1.3*      Radiology Studies: MR LIVER W WO CONTRAST  Result Date: 02/23/2022 CLINICAL DATA:  51 year old male with  history of cirrhosis and indeterminate liver lesion noted on recent CT examination. Follow-up study. EXAM: MRI ABDOMEN WITHOUT AND WITH CONTRAST TECHNIQUE: Multiplanar multisequence MR  imaging of the abdomen was performed both before and after the administration of intravenous contrast. CONTRAST:  22m GADAVIST GADOBUTROL 1 MMOL/ML IV SOLN COMPARISON:  No prior abdominal MRI. CT the abdomen and pelvis 02/22/2022. FINDINGS: Lower chest: Unremarkable. Hepatobiliary: Liver has a very nodular contour indicative of underlying cirrhosis. There is severe but heterogeneous loss of signal intensity throughout the hepatic parenchyma on out of phase dual echo images, indicative of a background of profound hepatic steatosis. Several small nodular areas are noted scattered throughout the hepatic parenchyma which are T1 hypointense and T2 hypointense, likely to represent regenerative nodules. No hypervascular hepatic lesion is confidently identified to clearly indicate the presence of hepatocellular carcinoma at this time. No intra or extrahepatic biliary ductal dilatation. Gallbladder is moderately distended. Gallbladder wall appears mildly edematous. No filling defect in the gallbladder to suggest gallstones. No intra or extrahepatic biliary ductal dilatation. Common bile duct measures 2 mm in the porta hepatis. No filling defect in the common bile duct to suggest choledocholithiasis. Pancreas: No pancreatic mass. No pancreatic ductal dilatation. No pancreatic or peripancreatic fluid collections or inflammatory changes. Spleen: Spleen is enlarged measuring 12.8 x 7.7 x 14.8 cm (estimated splenic volume of 729 mL) . Adrenals/Urinary Tract: Bilateral kidneys and adrenal glands are normal in appearance. No hydroureteronephrosis in the visualized portions of the abdomen. Stomach/Bowel: Visualized portions are unremarkable. Vascular/Lymphatic: No aneurysm identified in the visualized abdominal vasculature. Circumaortic left renal  vein (normal anatomical variant) incidentally noted. Portal vein is upper limits of normal (14 mm in diameter). No lymphadenopathy noted in the abdomen. Other:  Trace volume of perihepatic ascites.  No pneumoperitoneum. Musculoskeletal: No aggressive appearing osseous lesions are noted in the visualized portions of the skeleton. IMPRESSION: 1. Severe hepatic steatosis and hepatic cirrhosis, as above. This is associated with portal venous hypertension, as demonstrated by borderline dilated portal vein and splenomegaly. No suspicious hepatic lesions are noted at this time to indicate hepatocellular carcinoma. 2. Trace volume of perihepatic ascites. 3. Gallbladder wall appears edematous. This is favored to be related to intrinsic hepatic disease. No gallstones are noted to suggest an acute cholecystitis at this time. Electronically Signed   By: DVinnie LangtonM.D.   On: 02/23/2022 10:08   DG CHEST PORT 1 VIEW  Result Date: 02/22/2022 CLINICAL DATA:  History of cirrhosis with bloody stools, initial encounter EXAM: PORTABLE CHEST 1 VIEW COMPARISON:  10/08/2019 FINDINGS: The heart size and mediastinal contours are within normal limits. Both lungs are clear. The visualized skeletal structures are unremarkable. IMPRESSION: No active disease. Electronically Signed   By: MInez CatalinaM.D.   On: 02/22/2022 17:17       VHosie PoissonM.D. [facility] 02/24/2022, 2:50 PM  Available via Epic secure chat 7am-7pm After 7 pm, please refer to night coverage provider listed on amion.

## 2022-02-25 DIAGNOSIS — F1010 Alcohol abuse, uncomplicated: Secondary | ICD-10-CM | POA: Diagnosis not present

## 2022-02-25 DIAGNOSIS — K7031 Alcoholic cirrhosis of liver with ascites: Secondary | ICD-10-CM

## 2022-02-25 DIAGNOSIS — R933 Abnormal findings on diagnostic imaging of other parts of digestive tract: Secondary | ICD-10-CM

## 2022-02-25 DIAGNOSIS — K7011 Alcoholic hepatitis with ascites: Secondary | ICD-10-CM | POA: Diagnosis not present

## 2022-02-25 DIAGNOSIS — K7200 Acute and subacute hepatic failure without coma: Secondary | ICD-10-CM | POA: Diagnosis not present

## 2022-02-25 DIAGNOSIS — K922 Gastrointestinal hemorrhage, unspecified: Secondary | ICD-10-CM | POA: Diagnosis not present

## 2022-02-25 DIAGNOSIS — K921 Melena: Secondary | ICD-10-CM

## 2022-02-25 LAB — CBC WITH DIFFERENTIAL/PLATELET
Abs Immature Granulocytes: 0.02 10*3/uL (ref 0.00–0.07)
Basophils Absolute: 0.1 10*3/uL (ref 0.0–0.1)
Basophils Relative: 1 %
Eosinophils Absolute: 0.2 10*3/uL (ref 0.0–0.5)
Eosinophils Relative: 2 %
HCT: 38.8 % — ABNORMAL LOW (ref 39.0–52.0)
Hemoglobin: 13.6 g/dL (ref 13.0–17.0)
Immature Granulocytes: 0 %
Lymphocytes Relative: 28 %
Lymphs Abs: 2.2 10*3/uL (ref 0.7–4.0)
MCH: 34.7 pg — ABNORMAL HIGH (ref 26.0–34.0)
MCHC: 35.1 g/dL (ref 30.0–36.0)
MCV: 99 fL (ref 80.0–100.0)
Monocytes Absolute: 0.6 10*3/uL (ref 0.1–1.0)
Monocytes Relative: 8 %
Neutro Abs: 4.7 10*3/uL (ref 1.7–7.7)
Neutrophils Relative %: 61 %
Platelets: 52 10*3/uL — ABNORMAL LOW (ref 150–400)
RBC: 3.92 MIL/uL — ABNORMAL LOW (ref 4.22–5.81)
RDW: 14.8 % (ref 11.5–15.5)
WBC: 7.7 10*3/uL (ref 4.0–10.5)
nRBC: 0 % (ref 0.0–0.2)

## 2022-02-25 LAB — COMPREHENSIVE METABOLIC PANEL
ALT: 88 U/L — ABNORMAL HIGH (ref 0–44)
AST: 180 U/L — ABNORMAL HIGH (ref 15–41)
Albumin: 2.5 g/dL — ABNORMAL LOW (ref 3.5–5.0)
Alkaline Phosphatase: 167 U/L — ABNORMAL HIGH (ref 38–126)
Anion gap: 10 (ref 5–15)
BUN: 10 mg/dL (ref 6–20)
CO2: 25 mmol/L (ref 22–32)
Calcium: 9.1 mg/dL (ref 8.9–10.3)
Chloride: 95 mmol/L — ABNORMAL LOW (ref 98–111)
Creatinine, Ser: 0.92 mg/dL (ref 0.61–1.24)
GFR, Estimated: >60 mL/min (ref >60)
Glucose, Bld: 96 mg/dL (ref 70–99)
Potassium: 3.9 mmol/L (ref 3.5–5.1)
Sodium: 130 mmol/L — ABNORMAL LOW (ref 135–145)
Total Bilirubin: 7 mg/dL — ABNORMAL HIGH (ref 0.3–1.2)
Total Protein: 6.6 g/dL (ref 6.5–8.1)

## 2022-02-25 LAB — PROTIME-INR
INR: 1.3 — ABNORMAL HIGH (ref 0.8–1.2)
Prothrombin Time: 15.8 seconds — ABNORMAL HIGH (ref 11.4–15.2)

## 2022-02-25 MED ORDER — SODIUM CHLORIDE 0.9 % IV SOLN: INTRAVENOUS | Status: DC

## 2022-02-25 MED ORDER — PEG 3350-KCL-NABCB-NACL-NASULF 236 G PO SOLR
4000.0000 mL | Freq: Once | ORAL | Status: AC
Start: 2022-02-25 — End: 2022-02-25
  Administered 2022-02-25: 4000 mL via ORAL
  Filled 2022-02-25: qty 4000

## 2022-02-25 NOTE — H&P (View-Only) (Signed)
   [facility] Progress Note  CC:  Alcohol associated hepatitis/cirrhosis   Subjective: He denies having any N/V. He has epigastric and lower abdominal pain, not severe. He passed 7 to 8 loose nonbloody stools yesterday. Today, he's passed 3 loose stools. Stools have been darker, almost black since he started taking Ferrous Sulfate. He denies passing any black stools prior to admission. He had bright red rectal bleeding for 1 1/2 to 2 weeks daily prior to admission. He described seeing bright red blood on the stool and drops of blood in the toilet water. On a few occasions, he passed only blood per the rectum with any stool. No N/V. No CP or SOB. He fells less foggy today. No confusion. His mother is at the bed side.    Objective:  Vital signs in last 24 hours: Temp:  [98 F (36.7 C)-99.6 F (37.6 C)] 98.3 F (36.8 C) (05/22 0758) Pulse Rate:  [93-115] 93 (05/22 0758) Resp:  [16-20] 17 (05/22 0758) BP: (110-125)/(70-86) 110/86 (05/22 0758) SpO2:  [94 %-96 %] 96 % (05/22 0758) Last BM Date : 02/24/22 General:   Alert 51 year old male in NAD.  Eyes: Scleral icterus present.  Heart: RRR, no murmur.  Pulm: Breath sounds clear throughout.  Abdomen: Soft, mild epigastric, RUQ, RLQ and central lower abdominal tenderness without rebound or guarding. No ascites. No HSM. No palpable mass.  Extremities:  No edema. Neurologic:  Alert and  oriented x 4. Grossly normal neurologically. No asterixis.  Psych:  Alert and cooperative. Normal mood and affect.  Intake/Output from previous day: 05/21 0701 - 05/22 0700 In: 940 [P.O.:940] Out: -  Intake/Output this shift: No intake/output data recorded.  Lab Results: Recent Labs    02/22/22 2138 02/23/22 0324 02/25/22 0109  WBC  --  7.1 7.7  HGB 14.8 13.9 13.6  HCT 40.6 38.8* 38.8*  PLT  --  45* 52*   BMET Recent Labs    02/23/22 0324 02/24/22 1020 02/25/22 0109  NA 129* 130* 130*  K 3.2* 3.4* 3.9  CL 91* 93* 95*  CO2  25 27 25  GLUCOSE 88 121* 96  BUN 10 10 10  CREATININE 0.78 0.94 0.92  CALCIUM 8.3* 8.7* 9.1   LFT Recent Labs    02/25/22 0109  PROT 6.6  ALBUMIN 2.5*  AST 180*  ALT 88*  ALKPHOS 167*  BILITOT 7.0*   PT/INR Recent Labs    02/24/22 0101 02/25/22 0109  LABPROT 15.7* 15.8*  INR 1.3* 1.3*   Hepatitis Panel No results for input(s): HEPBSAG, HCVAB, HEPAIGM, HEPBIGM in the last 72 hours.  No results found.  Assessment / Plan:  1) 51 year old male with acute alcohol associated hepatitis on chronic decompensated cirrhosis. Admission MDF 16.5, MELD Na+ 23.  CTAP 5/19 identified a 2.1 cm liver lesion.  MRI without evidence of HCC  but showed severe hepatic steatosis, cirrhosis, dilated portal vein and splenomegaly. T. Bili 9.7 -> 7.0. Alk phos 197 -> 167. AST 235 -> 180. ALT 102 -> 88. Last alcohol intake was on 02/22/2022.  -Continue Furosemide 40 mg p.o. daily and Spironolactone 100 mg daily. -Continue folic acid 1 mg daily, multivitamin p.o. daily and thiamine 100 mg p.o. daily. -Continue Lactulose 10 g p.o. twice daily -CIWA protocol in process -Patient advised no alcohol ever   2) Anemia.  Hematemesis and hematochezia. Hg 14.8 -> 13.9 -> 13.6. EGD February 2023 with portal hypertensive gastropathy and no varices.  -Clear liquid diet  -  NPO after midnight  -EGD and colonoscopy benefits and risks discussed including risk with sedation, risk of bleeding, perforation and infection  -CBC and CMP in am  -Continue Pantoprazole 40mg po bid   3) Hyponatremia/hypokalemia, stable. Remains on Furosemide and Spironolactone. Na+ 130. K+ 3.9.   4) CTAP identified colonic wall thickening to the distal ascending and proximal colon, thought to likely represent colopathy from cirrhosis.  -Colonoscopy as ordered above   5)  Personal history of adenomatous polyps per colonoscopy 06/2018. Overdue for surveillance colonoscopy.  6) Thrombocytopenia. PLT 52.      Principal Problem:    Alcoholic hepatitis with ascites Active Problems:   Alcohol abuse with withdrawal (HCC)   Alcohol dependence (HCC)   Alcoholic cirrhosis of liver with ascites (HCC)   Acute upper GI bleed     LOS: 3 days   Colleen M Kennedy-Smith  02/25/2022, 1:02 PM   Attending physician's note   I have taken a history, reviewed the chart and examined the patient. I performed a substantive portion of this encounter, including complete performance of at least one of the key components, in conjunction with the APP. I agree with the APP's note, impression and recommendations.    Complains of black tarry stool with multiple bowel movements.  Hemoglobin trended down from 15 to 13  Acute alcoholic hepatitis and decompensated alcoholic cirrhosis with ascites Discriminant function score less than 32 MELD-Na score: 22 at 49/22/2023  1:09 AM  He is past due for surveillance colonoscopy, noted colonic wall thickening in the ascending colon.  Will plan to proceed with colonoscopy along with EGD for surveillance of esophageal varices Clear liquids N.p.o. after 5 AM Bowel prep  The risks and benefits as well as alternatives of endoscopic procedure(s) have been discussed and reviewed. All questions answered. The patient agrees to proceed.  The patient was provided an opportunity to ask questions and all were answered. The patient agreed with the plan and demonstrated an understanding of the instructions.   K. Veena  , MD [phone]     

## 2022-02-25 NOTE — Progress Notes (Signed)
[facility]                                                                               Omar Turner, is a 51 y.o. male, DOB - [birth year], SVX:[id number] Admit date - 02/22/2022    Outpatient Primary MD for the patient is Omar Hector, MD  LOS - 3  days    Brief summary   Omar Turner is a 51 y.o. male with medical history significant of cirrhosis secondary to alcohol abuse with portal hypertension, ascites, alcoholic hepatitis, GERD, ventral hernia repair, recent GI bleed s/p EGD, came with repeated nauseous vomiting, hematemesis and alcohol withdrawal symptoms. Patient started to drink alcohol about 1 month ago, with 1/4-1/2 gallon of Vodka daily and starting 2 weeks ago, patient started to have frequent nauseous, retching and occasional vomiting associated with drinking.  He was admitted for evaluation of upper GI bleed.      Assessment & Plan    Assessment and Plan:   Upper GI bleeding, possibly either Mallory weiss tear vs portal hypertensive gastropathy.  - continue with PPI bid for now.  - GI on board.  - underwent EGD 3 months ago.  Hemoglobin appears to be stable around 13.9 this am dropped from 15.6.  No more episodes of nausea, vomiting, or hematemesis. But he reports bright red blood per rectum.  Stool for occult blood is positive.  Plan for colonoscopy and EGD tomorrow. NPO tomorrow with bowel prep ordered by GI.     Decompensated alcoholic cirrhosis with alcoholic hepatitis Underwent MRI of the liver showing Severe hepatic steatosis and hepatic cirrhosis, portal hypertension and splenomegaly.  Elevated liver enzymes with hyperbilirubinemia.  Though liver enzymes are improving, bilirubin increased from 8.5 to 9.7 this afternoon.  INR IS 1.3. GI on board and currently on lasix and spironolactone.    Hyponatremia: from fluid overload and alcohol use. Sodium has improved to 130 with diuresis.  Watch for hepato renal syndrome.      Alcohol Use disorder:  Improving withdrawals  On CIWA, Atarax added.  On thiamine and folic acid.    Hypokalemia  Replaced.    Tobacco abuse On nicotine patch.       Estimated body mass index is 30.68 kg/m as calculated from the following:   Height as of this encounter: '5\' 11"'$  (1.803 m).   Weight as of this encounter: 99.8 kg.  Code Status: full code.  DVT Prophylaxis:  SCDs Start: 02/22/22 1256   Level of Care: Level of care: Telemetry Medical Family Communication: none at bedside.   Disposition Plan:     Remains inpatient appropriate:  for BRBPR, alcohol withdrawals  Procedures:  None.  Consultants:   Gastroenterology.   Antimicrobials:   Anti-infectives (From admission, onward)    None        Medications  Scheduled Meds:  ferrous sulfate  325 mg Oral q morning   folic acid  1 mg Oral Daily   furosemide  40 mg Oral Daily   lactulose  10 g Oral BID   LORazepam  0-4 mg Oral Q12H   montelukast  10 mg Oral Daily   multivitamin with minerals  1 tablet Oral Daily   nicotine  21 mg Transdermal Daily   pantoprazole  40 mg Oral BID AC   polyethylene glycol  4,000 mL Oral Once   QUEtiapine  25 mg Oral QHS   spironolactone  100 mg Oral Daily   thiamine  100 mg Oral Daily   Or   thiamine  100 mg Intravenous Daily   Continuous Infusions: PRN Meds:.acetaminophen, hydrOXYzine, ondansetron (ZOFRAN) IV    Subjective:   Omar Turner was seen and examined today.  Abd pain has improved. Having bowel movements.  No chest pain or sob.   Objective:   Vitals:   02/24/22 2113 02/24/22 2124 02/25/22 0522 02/25/22 0758  BP: 116/70  118/77 110/86  Pulse: (!) 115 (!) 108 96 93  Resp: '20  16 17  '$ Temp: 99.6 F (37.6 C)  98 F (36.7 C) 98.3 F (36.8 C)  TempSrc: Oral  Oral Oral  SpO2: 95%  96% 96%  Weight:      Height:        Intake/Output Summary (Last 24 hours) at 02/25/2022 1403 Last data filed at 02/24/2022 2255 Gross per 24 hour  Intake 480  ml  Output --  Net 480 ml    Filed Weights   02/22/22 0923  Weight: 99.8 kg     Exam General exam: Appears calm and comfortable  Respiratory system: Clear to auscultation. Respiratory effort normal. Cardiovascular system: S1 & S2 heard, RRR. No JVD, . No pedal edema. Gastrointestinal system: Abdomen is nondistended, soft and nontender.  Normal bowel sounds heard. Central nervous system: Alert and oriented. No focal neurological deficits. Extremities: Symmetric 5 x 5 power. Skin: No rashes, lesions or ulcers Psychiatry: Judgement and insight appear normal. Mood & affect appropriate.     Data Reviewed:  I have personally reviewed following labs and imaging studies   CBC Lab Results  Component Value Date   WBC 7.7 02/25/2022   RBC 3.92 (L) 02/25/2022   HGB 13.6 02/25/2022   HCT 38.8 (L) 02/25/2022   MCV 99.0 02/25/2022   MCH 34.7 (H) 02/25/2022   PLT 52 (L) 02/25/2022   MCHC 35.1 02/25/2022   RDW 14.8 02/25/2022   LYMPHSABS 2.2 02/25/2022   MONOABS 0.6 02/25/2022   EOSABS 0.2 02/25/2022   BASOSABS 0.1 53/97/6734     Last metabolic panel Lab Results  Component Value Date   NA 130 (L) 02/25/2022   K 3.9 02/25/2022   CL 95 (L) 02/25/2022   CO2 25 02/25/2022   BUN 10 02/25/2022   CREATININE 0.92 02/25/2022   GLUCOSE 96 02/25/2022   GFRNONAA >60 02/25/2022   GFRAA >60 10/28/2019   CALCIUM 9.1 02/25/2022   PHOS 3.2 02/22/2022   PROT 6.6 02/25/2022   ALBUMIN 2.5 (L) 02/25/2022   BILITOT 7.0 (H) 02/25/2022   ALKPHOS 167 (H) 02/25/2022   AST 180 (H) 02/25/2022   ALT 88 (H) 02/25/2022   ANIONGAP 10 02/25/2022    CBG (last 3)  No results for input(s): GLUCAP in the last 72 hours.    Coagulation Profile: Recent Labs  Lab 02/22/22 0930 02/23/22 1236 02/24/22 0101 02/25/22 0109  INR 1.2 1.3* 1.3* 1.3*      Radiology Studies: No results found.     Hosie Poisson M.D. [facility] 02/25/2022, 2:03 PM  Available via Epic secure chat  7am-7pm After 7 pm, please refer to night coverage provider listed on amion.

## 2022-02-25 NOTE — TOC CAGE-AID Note (Signed)
Transition of Care [facility]) - CAGE-AID Screening   Patient Details  Name: Omar Turner MRN: [id number] Date of Birth: 1971/09/13  Transition of Care [facility]) CM/SW Contact:    Emeterio Reeve, LCSW Phone Number: 02/25/2022, 1:14 PM   Clinical Narrative:  CSW spoke to pt and mother at bedside. Pt reports he drinks a fifth to a half gallon of vodka a day. Pt reports he wants to quit alcohol use and was receptive to counseling and resources. [facility]. CSW encouraged pt to call and speak to admissions.  CAGE-AID Screening:    Have You Ever Felt You Ought to Cut Down on Your Drinking or Drug Use?: Yes Have People Annoyed You By Critizing Your Drinking Or Drug Use?: Yes Have You Felt Bad Or Guilty About Your Drinking Or Drug Use?: Yes Have You Ever Had a Drink or Used Drugs First Thing In The Morning to Steady Your Nerves or to Get Rid of a Hangover?: Yes CAGE-AID Score: 4  Substance Abuse Education Offered: Yes  Substance abuse interventions: Patient Counseling, Educational Materials    Emeterio Reeve, [place] Clinical Social Worker

## 2022-02-25 NOTE — Progress Notes (Addendum)
   [facility] Progress Note  CC:  Alcohol associated hepatitis/cirrhosis   Subjective: He denies having any N/V. He has epigastric and lower abdominal pain, not severe. He passed 7 to 8 loose nonbloody stools yesterday. Today, he's passed 3 loose stools. Stools have been darker, almost black since he started taking Ferrous Sulfate. He denies passing any black stools prior to admission. He had bright red rectal bleeding for 1 1/2 to 2 weeks daily prior to admission. He described seeing bright red blood on the stool and drops of blood in the toilet water. On a few occasions, he passed only blood per the rectum with any stool. No N/V. No CP or SOB. He fells less foggy today. No confusion. His mother is at the bed side.    Objective:  Vital signs in last 24 hours: Temp:  [98 F (36.7 C)-99.6 F (37.6 C)] 98.3 F (36.8 C) (05/22 0758) Pulse Rate:  [93-115] 93 (05/22 0758) Resp:  [16-20] 17 (05/22 0758) BP: (110-125)/(70-86) 110/86 (05/22 0758) SpO2:  [94 %-96 %] 96 % (05/22 0758) Last BM Date : 02/24/22 General:   Alert 51 year old male in NAD.  Eyes: Scleral icterus present.  Heart: RRR, no murmur.  Pulm: Breath sounds clear throughout.  Abdomen: Soft, mild epigastric, RUQ, RLQ and central lower abdominal tenderness without rebound or guarding. No ascites. No HSM. No palpable mass.  Extremities:  No edema. Neurologic:  Alert and  oriented x 4. Grossly normal neurologically. No asterixis.  Psych:  Alert and cooperative. Normal mood and affect.  Intake/Output from previous day: 05/21 0701 - 05/22 0700 In: 940 [P.O.:940] Out: -  Intake/Output this shift: No intake/output data recorded.  Lab Results: Recent Labs    02/22/22 2138 02/23/22 0324 02/25/22 0109  WBC  --  7.1 7.7  HGB 14.8 13.9 13.6  HCT 40.6 38.8* 38.8*  PLT  --  45* 52*   BMET Recent Labs    02/23/22 0324 02/24/22 1020 02/25/22 0109  NA 129* 130* 130*  K 3.2* 3.4* 3.9  CL 91* 93* 95*  CO2  25 27 25  GLUCOSE 88 121* 96  BUN 10 10 10  CREATININE 0.78 0.94 0.92  CALCIUM 8.3* 8.7* 9.1   LFT Recent Labs    02/25/22 0109  PROT 6.6  ALBUMIN 2.5*  AST 180*  ALT 88*  ALKPHOS 167*  BILITOT 7.0*   PT/INR Recent Labs    02/24/22 0101 02/25/22 0109  LABPROT 15.7* 15.8*  INR 1.3* 1.3*   Hepatitis Panel No results for input(s): HEPBSAG, HCVAB, HEPAIGM, HEPBIGM in the last 72 hours.  No results found.  Assessment / Plan:  1) 51 year old male with acute alcohol associated hepatitis on chronic decompensated cirrhosis. Admission MDF 16.5, MELD Na+ 23.  CTAP 5/19 identified a 2.1 cm liver lesion.  MRI without evidence of HCC  but showed severe hepatic steatosis, cirrhosis, dilated portal vein and splenomegaly. T. Bili 9.7 -> 7.0. Alk phos 197 -> 167. AST 235 -> 180. ALT 102 -> 88. Last alcohol intake was on 02/22/2022.  -Continue Furosemide 40 mg p.o. daily and Spironolactone 100 mg daily. -Continue folic acid 1 mg daily, multivitamin p.o. daily and thiamine 100 mg p.o. daily. -Continue Lactulose 10 g p.o. twice daily -CIWA protocol in process -Patient advised no alcohol ever   2) Anemia.  Hematemesis and hematochezia. Hg 14.8 -> 13.9 -> 13.6. EGD February 2023 with portal hypertensive gastropathy and no varices.  -Clear liquid diet  -  NPO after midnight  -EGD and colonoscopy benefits and risks discussed including risk with sedation, risk of bleeding, perforation and infection  -CBC and CMP in am  -Continue Pantoprazole 40mg po bid   3) Hyponatremia/hypokalemia, stable. Remains on Furosemide and Spironolactone. Na+ 130. K+ 3.9.   4) CTAP identified colonic wall thickening to the distal ascending and proximal colon, thought to likely represent colopathy from cirrhosis.  -Colonoscopy as ordered above   5)  Personal history of adenomatous polyps per colonoscopy 06/2018. Overdue for surveillance colonoscopy.  6) Thrombocytopenia. PLT 52.      Principal Problem:    Alcoholic hepatitis with ascites Active Problems:   Alcohol abuse with withdrawal (HCC)   Alcohol dependence (HCC)   Alcoholic cirrhosis of liver with ascites (HCC)   Acute upper GI bleed     LOS: 3 days   Colleen M Kennedy-Smith  02/25/2022, 1:02 PM   Attending physician's note   I have taken a history, reviewed the chart and examined the patient. I performed a substantive portion of this encounter, including complete performance of at least one of the key components, in conjunction with the APP. I agree with the APP's note, impression and recommendations.    Complains of black tarry stool with multiple bowel movements.  Hemoglobin trended down from 15 to 13  Acute alcoholic hepatitis and decompensated alcoholic cirrhosis with ascites Discriminant function score less than 32 MELD-Na score: 22 at 02/25/2022  1:09 AM  He is past due for surveillance colonoscopy, noted colonic wall thickening in the ascending colon.  Will plan to proceed with colonoscopy along with EGD for surveillance of esophageal varices Clear liquids N.p.o. after 5 AM Bowel prep  The risks and benefits as well as alternatives of endoscopic procedure(s) have been discussed and reviewed. All questions answered. The patient agrees to proceed.  The patient was provided an opportunity to ask questions and all were answered. The patient agreed with the plan and demonstrated an understanding of the instructions.   K. Veena Judieth Mckown , MD [phone]     

## 2022-02-25 NOTE — Anesthesia Preprocedure Evaluation (Signed)
Anesthesia Evaluation  Patient identified by MRN, date of birth, ID band Patient awake    Reviewed: Allergy & Precautions, H&P , NPO status , Patient's Chart, lab work & pertinent test results  Airway Mallampati: III  TM Distance: >3 FB Neck ROM: Full    Dental no notable dental hx. (+) Teeth Intact, Dental Advisory Given   Pulmonary sleep apnea , Current Smoker and Patient abstained from smoking.,    Pulmonary exam normal breath sounds clear to auscultation       Cardiovascular Exercise Tolerance: Good hypertension, Pt. on medications  Rhythm:Regular Rate:Normal     Neuro/Psych Anxiety negative neurological ROS     GI/Hepatic Neg liver ROS, GERD  Medicated,  Endo/Other  negative endocrine ROS  Renal/GU negative Renal ROS  negative genitourinary   Musculoskeletal  (+) Arthritis , Osteoarthritis,    Abdominal   Peds  Hematology  (+) Blood dyscrasia, anemia ,   Anesthesia Other Findings   Reproductive/Obstetrics negative OB ROS                            Anesthesia Physical Anesthesia Plan  ASA: 3  Anesthesia Plan: MAC   Post-op Pain Management: Minimal or no pain anticipated   Induction: Intravenous  PONV Risk Score and Plan: 0 and Propofol infusion  Airway Management Planned: Natural Airway and Nasal Cannula  Additional Equipment:   Intra-op Plan:   Post-operative Plan:   Informed Consent: I have reviewed the patients History and Physical, chart, labs and discussed the procedure including the risks, benefits and alternatives for the proposed anesthesia with the patient or authorized representative who has indicated his/her understanding and acceptance.     Dental advisory given  Plan Discussed with: CRNA  Anesthesia Plan Comments:        Anesthesia Quick Evaluation

## 2022-02-26 ENCOUNTER — Inpatient Hospital Stay (HOSPITAL_COMMUNITY): Payer: BC Managed Care – PPO | Admitting: Anesthesiology

## 2022-02-26 ENCOUNTER — Encounter (HOSPITAL_COMMUNITY)
Admission: EM | Disposition: A | Discharge status: Home / Self Care | Payer: Self-pay | Source: Home / Self Care | Attending: Internal Medicine

## 2022-02-26 ENCOUNTER — Encounter (HOSPITAL_COMMUNITY): Payer: Self-pay | Admitting: Internal Medicine

## 2022-02-26 DIAGNOSIS — R195 Other fecal abnormalities: Secondary | ICD-10-CM

## 2022-02-26 DIAGNOSIS — D122 Benign neoplasm of ascending colon: Secondary | ICD-10-CM

## 2022-02-26 DIAGNOSIS — K648 Other hemorrhoids: Secondary | ICD-10-CM

## 2022-02-26 DIAGNOSIS — K766 Portal hypertension: Secondary | ICD-10-CM

## 2022-02-26 DIAGNOSIS — D509 Iron deficiency anemia, unspecified: Secondary | ICD-10-CM

## 2022-02-26 DIAGNOSIS — I8510 Secondary esophageal varices without bleeding: Secondary | ICD-10-CM

## 2022-02-26 DIAGNOSIS — K635 Polyp of colon: Secondary | ICD-10-CM

## 2022-02-26 DIAGNOSIS — K644 Residual hemorrhoidal skin tags: Secondary | ICD-10-CM

## 2022-02-26 DIAGNOSIS — K5730 Diverticulosis of large intestine without perforation or abscess without bleeding: Secondary | ICD-10-CM

## 2022-02-26 DIAGNOSIS — R933 Abnormal findings on diagnostic imaging of other parts of digestive tract: Secondary | ICD-10-CM

## 2022-02-26 HISTORY — PX: ESOPHAGOGASTRODUODENOSCOPY (EGD) WITH PROPOFOL: SHX5813

## 2022-02-26 HISTORY — PX: POLYPECTOMY: SHX5525

## 2022-02-26 HISTORY — PX: COLONOSCOPY WITH PROPOFOL: SHX5780

## 2022-02-26 LAB — CBC
HCT: 38.8 % — ABNORMAL LOW (ref 39.0–52.0)
Hemoglobin: 14 g/dL (ref 13.0–17.0)
MCH: 35.4 pg — ABNORMAL HIGH (ref 26.0–34.0)
MCHC: 36.1 g/dL — ABNORMAL HIGH (ref 30.0–36.0)
MCV: 98.2 fL (ref 80.0–100.0)
Platelets: 60 10*3/uL — ABNORMAL LOW (ref 150–400)
RBC: 3.95 MIL/uL — ABNORMAL LOW (ref 4.22–5.81)
RDW: 15.2 % (ref 11.5–15.5)
WBC: 7.4 10*3/uL (ref 4.0–10.5)
nRBC: 0 % (ref 0.0–0.2)

## 2022-02-26 LAB — COMPREHENSIVE METABOLIC PANEL
ALT: 84 U/L — ABNORMAL HIGH (ref 0–44)
AST: 160 U/L — ABNORMAL HIGH (ref 15–41)
Albumin: 2.7 g/dL — ABNORMAL LOW (ref 3.5–5.0)
Alkaline Phosphatase: 172 U/L — ABNORMAL HIGH (ref 38–126)
Anion gap: 6 (ref 5–15)
BUN: 15 mg/dL (ref 6–20)
CO2: 26 mmol/L (ref 22–32)
Calcium: 8.6 mg/dL — ABNORMAL LOW (ref 8.9–10.3)
Chloride: 96 mmol/L — ABNORMAL LOW (ref 98–111)
Creatinine, Ser: 1.05 mg/dL (ref 0.61–1.24)
GFR, Estimated: >60 mL/min (ref >60)
Glucose, Bld: 95 mg/dL (ref 70–99)
Potassium: 4 mmol/L (ref 3.5–5.1)
Sodium: 128 mmol/L — ABNORMAL LOW (ref 135–145)
Total Bilirubin: 7.6 mg/dL — ABNORMAL HIGH (ref 0.3–1.2)
Total Protein: 6.9 g/dL (ref 6.5–8.1)

## 2022-02-26 LAB — PROTIME-INR
INR: 1.3 — ABNORMAL HIGH (ref 0.8–1.2)
Prothrombin Time: 15.9 seconds — ABNORMAL HIGH (ref 11.4–15.2)

## 2022-02-26 SURGERY — ESOPHAGOGASTRODUODENOSCOPY (EGD) WITH PROPOFOL
Anesthesia: Monitor Anesthesia Care

## 2022-02-26 MED ORDER — NADOLOL 20 MG PO TABS: 20.0000 mg | ORAL_TABLET | Freq: Every day | ORAL | 1 refills | Status: DC

## 2022-02-26 MED ORDER — NADOLOL 20 MG PO TABS
20.0000 mg | ORAL_TABLET | Freq: Every day | ORAL | Status: DC
  Administered 2022-02-26: 20 mg via ORAL
  Filled 2022-02-26: qty 1

## 2022-02-26 MED ORDER — PROPOFOL 10 MG/ML IV BOLUS
INTRAVENOUS | Status: DC | PRN
  Administered 2022-02-26 (×2): 20 mg via INTRAVENOUS
  Administered 2022-02-26 (×3): 10 mg via INTRAVENOUS

## 2022-02-26 MED ORDER — HYDROXYZINE HCL 25 MG PO TABS: 25.0000 mg | ORAL_TABLET | Freq: Three times a day (TID) | ORAL | 0 refills | Status: DC | PRN

## 2022-02-26 MED ORDER — NICOTINE 21 MG/24HR TD PT24: 21.0000 mg | MEDICATED_PATCH | Freq: Every day | TRANSDERMAL | 0 refills | Status: DC

## 2022-02-26 MED ORDER — PROPOFOL 500 MG/50ML IV EMUL
INTRAVENOUS | Status: DC | PRN
  Administered 2022-02-26: 150 ug/kg/min via INTRAVENOUS

## 2022-02-26 MED ORDER — LIDOCAINE 2% (20 MG/ML) 5 ML SYRINGE
INTRAMUSCULAR | Status: DC | PRN
  Administered 2022-02-26: 40 mg via INTRAVENOUS
  Administered 2022-02-26: 60 mg via INTRAVENOUS

## 2022-02-26 MED ORDER — ADULT MULTIVITAMIN W/MINERALS CH: 1.0000 | ORAL_TABLET | Freq: Every day | ORAL | Status: DC

## 2022-02-26 MED ORDER — LACTULOSE 10 GM/15ML PO SOLN: 10.0000 g | Freq: Two times a day (BID) | ORAL | 0 refills | Status: DC

## 2022-02-26 MED ORDER — PHENYLEPHRINE 80 MCG/ML (10ML) SYRINGE FOR IV PUSH (FOR BLOOD PRESSURE SUPPORT)
PREFILLED_SYRINGE | INTRAVENOUS | Status: DC | PRN
  Administered 2022-02-26: 80 ug via INTRAVENOUS

## 2022-02-26 MED ORDER — LACTATED RINGERS IV SOLN: INTRAVENOUS | Status: DC

## 2022-02-26 SURGICAL SUPPLY — 25 items

## 2022-02-26 NOTE — Op Note (Signed)
[facility] Patient Name: Omar Turner Procedure Date : 02/26/2022 MRN: [id number] Attending MD: Mauri Pole , MD Date of Birth: 06-09-1971 CSN: [id number] Age: 51 Admit Type: Inpatient Procedure:                Upper GI endoscopy Indications:              Suspected upper gastrointestinal bleeding in                            patient with chronic blood loss, Gastrointestinal                            bleeding of unknown origin, Suspected upper                            gastrointestinal bleeding in patient with                            unexplained iron deficiency anemia, To evaluate                            esophageal varices in patient with suspected portal                            hypertension Providers:                Mauri Pole, MD, Mikey College, RN, Tyna Jaksch Technician, Darliss Cheney, Technician Referring MD:              Medicines:                Monitored Anesthesia Care Complications:            No immediate complications. Estimated Blood Loss:     Estimated blood loss was minimal. Procedure:                Pre-Anesthesia Assessment:                           - Prior to the procedure, a History and Physical                            was performed, and patient medications and                            allergies were reviewed. The patient's tolerance of                            previous anesthesia was also reviewed. The risks                            and benefits of the procedure and the sedation                            options and risks were  discussed with the patient.                            All questions were answered, and informed consent                            was obtained. Prior Anticoagulants: The patient has                            taken no previous anticoagulant or antiplatelet                            agents. ASA Grade Assessment: IV - A patient with                             severe systemic disease that is a constant threat                            to life. After reviewing the risks and benefits,                            the patient was deemed in satisfactory condition to                            undergo the procedure.                           After obtaining informed consent, the endoscope was                            passed under direct vision. Throughout the                            procedure, the patient's blood pressure, pulse, and                            oxygen saturations were monitored continuously. The                            [id number]-[id number] ([id number]) Olympus endoscope was introduced                            through the mouth, and advanced to the second part                            of duodenum. The upper GI endoscopy was                            accomplished without difficulty. The patient                            tolerated the procedure well. Scope In: Scope Out: Findings:      Three columns of grade II varices with no bleeding and no stigmata of  recent bleeding were found in the lower third of the esophagus,. They       were less than 5 mm in largest diameter. No red wale signs were present.      Mild portal hypertensive gastropathy was found in the entire examined       stomach.      The cardia and gastric fundus were normal on retroflexion.      The examined duodenum was normal. Impression:               - Grade II esophageal varices with no bleeding and                            no stigmata of recent bleeding.                           - Portal hypertensive gastropathy.                           - Normal examined duodenum.                           - No specimens collected. Recommendation:           - Patient has a contact number available for                            emergencies. The signs and symptoms of potential                            delayed complications were discussed with the                             patient. Return to normal activities tomorrow.                            Written discharge instructions were provided to the                            patient.                           - Resume previous diet.                           - Continue present medications.                           - Follow an antireflux regimen.                           - Use Protonix (pantoprazole) 40 mg PO daily.                           - Start Nadolol '20mg'$  daily and titrate up dose to                            goal HR 50-60                           -  Alcohol cessation                           - Follow up with outpatient GI ([facility]) to be                            scheduled by patient on discharge in 4-6 weeks                           - Ok to discharge home from GI standpoint, we will                            sign off. Please call with any questions Procedure Code(s):        --- Professional ---                           [phone], Esophagogastroduodenoscopy, flexible,                            transoral; diagnostic, including collection of                            specimen(s) by brushing or washing, when performed                            (separate procedure) Diagnosis Code(s):        --- Professional ---                           I85.00, Esophageal varices without bleeding                           K76.6, Portal hypertension                           K31.89, Other diseases of stomach and duodenum                           R58, Hemorrhage, not elsewhere classified                           K92.2, Gastrointestinal hemorrhage, unspecified                           D50.9, Iron deficiency anemia, unspecified CPT copyright 2019 American Medical Association. All rights reserved. The codes documented in this report are preliminary and upon coder review may  be revised to meet current compliance requirements. Mauri Pole, MD 02/26/2022 9:22:01 AM This report has been signed  electronically. Number of Addenda: 0

## 2022-02-26 NOTE — Progress Notes (Signed)
Discharge instructions given. Patient verbalized understanding and all questions were answered.  ?

## 2022-02-26 NOTE — Anesthesia Procedure Notes (Signed)
Procedure Name: MAC Date/Time: 02/26/2022 8:16 AM Performed by: Dorthea Cove, CRNA Pre-anesthesia Checklist: Patient identified, Emergency Drugs available, Suction available, Patient being monitored and Timeout performed Patient Re-evaluated:Patient Re-evaluated prior to induction Oxygen Delivery Method: Nasal cannula Preoxygenation: Pre-oxygenation with 100% oxygen Induction Type: IV induction Placement Confirmation: positive ETCO2 and CO2 detector Dental Injury: Teeth and Oropharynx as per pre-operative assessment

## 2022-02-26 NOTE — Interval H&P Note (Signed)
History and Physical Interval Note:  02/26/2022 8:07 AM  Omar Turner  has presented today for surgery, with the diagnosis of Melena, alcoholic cirrhosis decompensated with ascites, abnormal CT.  The various methods of treatment have been discussed with the patient and family. After consideration of risks, benefits and other options for treatment, the patient has consented to  Procedure(s): ESOPHAGOGASTRODUODENOSCOPY (EGD) WITH PROPOFOL (N/A) COLONOSCOPY WITH PROPOFOL (N/A) as a surgical intervention.  The patient's history has been reviewed, patient examined, no change in status, stable for surgery.  I have reviewed the patient's chart and labs.  Questions were answered to the patient's satisfaction.     [name]

## 2022-02-26 NOTE — Anesthesia Postprocedure Evaluation (Signed)
Anesthesia Post Note  Patient: Omar Turner  Procedure(s) Performed: ESOPHAGOGASTRODUODENOSCOPY (EGD) WITH PROPOFOL COLONOSCOPY WITH PROPOFOL     Patient location during evaluation: Endoscopy Anesthesia Type: MAC Level of consciousness: awake and alert Pain management: pain level controlled Vital Signs Assessment: post-procedure vital signs reviewed and stable Respiratory status: spontaneous breathing, nonlabored ventilation and respiratory function stable Cardiovascular status: stable and blood pressure returned to baseline Postop Assessment: no apparent nausea or vomiting Anesthetic complications: no   No notable events documented.  Last Vitals:  Vitals:   02/26/22 0905 02/26/22 0915  BP: (!) 124/91 117/89  Pulse: 98 86  Resp: (!) 21 (!) 21  Temp:    SpO2: 97% 99%    Last Pain:  Vitals:   02/26/22 0915  TempSrc:   PainSc: 0-No pain                 Gisella Alwine,W. EDMOND

## 2022-02-26 NOTE — Progress Notes (Signed)
PT Cancellation Note  Patient Details Name: Omar Turner MRN: [id number] DOB: [birth year]   Cancelled Treatment:    Reason Eval/Treat Not Completed: PT screened, no needs identified, will sign off. Patient ambulating independently in room. Reports no needs. Planning for discharge home today. Mother present in room.    [name] 02/26/2022, 11:43 AM

## 2022-02-26 NOTE — Discharge Summary (Signed)
Physician Discharge Summary   Patient: Omar Turner MRN: [id number] DOB: 02/26/1971  Admit date:     02/22/2022  Discharge date: 02/26/2022  Discharge Physician: Hosie Poisson   PCP: Adin Hector, MD   Recommendations at discharge:  Please follow up with gastroenterology as recommended.  Please follo wup with PCP in one week.   Discharge Diagnoses: Principal Problem:   Alcoholic hepatitis with ascites Active Problems:   Alcohol abuse with withdrawal ([place])   Alcohol dependence ([place])   Alcoholic cirrhosis of liver with ascites ([facility])   Acute upper GI bleed    Hospital Course: TOMOTHY EDDINS is a 51 y.o. male with medical history significant of cirrhosis secondary to alcohol abuse with portal hypertension, ascites, alcoholic hepatitis, GERD, ventral hernia repair, recent GI bleed s/p EGD, came with repeated nauseous vomiting, hematemesis and alcohol withdrawal symptoms. Patient started to drink alcohol about 1 month ago, with 1/4-1/2 gallon of Vodka daily and starting 2 weeks ago, patient started to have frequent nauseous, retching and occasional vomiting associated with drinking.  He was admitted for evaluation of upper GI bleed.  Assessment and Plan:  Upper GI bleeding, possibly either Mallory weiss tear vs portal hypertensive gastropathy.  - continue with PPI bid for now.  - GI on board.  - underwent EGD 3 months ago.  Hemoglobin appears to be stable around 13.9 this am dropped from 15.6.  No more episodes of nausea, vomiting, or hematemesis. But he reports bright red blood per rectum.  Stool for occult blood is positive.  Underwent colonoscopy and EGD , and results reviewed with the patient.  Recommend outpatient follow up with GI for biopsy results.        Decompensated alcoholic cirrhosis with alcoholic hepatitis Underwent MRI of the liver showing Severe hepatic steatosis and hepatic cirrhosis, portal hypertension and splenomegaly.  Elevated liver enzymes with  hyperbilirubinemia.  Though liver enzymes are improving, bilirubin increased from 8.5 to 9.7 this afternoon.  INR IS 1.3. GI on board and currently on lasix and spironolactone.      Hyponatremia: from fluid overload and alcohol use. Sodium has improved to 130 with diuresis.  Watch for hepato renal syndrome.        Alcohol Use disorder:  No signs of withdrawal on discharge.  On CIWA, Atarax added.  On thiamine and folic acid.      Hypokalemia  Replaced.      Tobacco abuse On nicotine patch.          Estimated body mass index is 30.68 kg/m as calculated from the following:   Height as of this encounter: '5\' 11"'$  (1.803 m).   Weight as of this encounter: 99.8 kg.     Consultants: gi Procedures performed: EGD, colonoscopy  Disposition: Home Diet recommendation:  Discharge Diet Orders (From admission, onward)     Start     Ordered   02/26/22 0000  Diet - low sodium heart healthy        02/26/22 1128           Regular diet DISCHARGE MEDICATION: Allergies as of 02/26/2022       Reactions   Chantix [varenicline]    Lexapro [escitalopram]            Medication List     TAKE these medications    FeroSul 325 (65 FE) MG tablet Generic drug: ferrous sulfate Take 325 mg by mouth every morning.   folic acid 1 MG tablet Commonly known as: [employer]  Take 1 mg by mouth daily.   furosemide 40 MG tablet Commonly known as: LASIX Take 40 mg by mouth daily.   hydrOXYzine 25 MG tablet Commonly known as: ATARAX Take 1 tablet (25 mg total) by mouth 3 (three) times daily as needed for anxiety.   lactulose 10 GM/15ML solution Commonly known as: CHRONULAC Take 15 mLs (10 g total) by mouth 2 (two) times daily.   montelukast 10 MG tablet Commonly known as: SINGULAIR Take 10 mg by mouth daily.   multivitamin with minerals Tabs tablet Take 1 tablet by mouth daily. Start taking on: Feb 27, 2022   nadolol 20 MG tablet Commonly known as: CORGARD Take 1 tablet  (20 mg total) by mouth daily. Start taking on: Feb 27, 2022   nicotine 21 mg/24hr patch Commonly known as: NICODERM CQ - dosed in mg/24 hours Place 1 patch (21 mg total) onto the skin daily. Start taking on: Feb 27, 2022   pantoprazole 40 MG tablet Commonly known as: PROTONIX Take 1 tablet (40 mg total) by mouth 2 (two) times daily.   QUEtiapine 25 MG tablet Commonly known as: SEROQUEL Take 25 mg by mouth at bedtime.   spironolactone 100 MG tablet Commonly known as: ALDACTONE Take 100 mg by mouth daily.   thiamine 100 MG tablet Take 100 mg by mouth daily.        Follow-up Information     Tama High III, MD. Schedule an appointment as soon as possible for a visit in 1 week(s).   Specialty: Internal Medicine Contact information: [place] [address] [phone]         Mauri Pole, MD. Schedule an appointment as soon as possible for a visit in 1 week(s).   Specialty: Gastroenterology Why: for biopsy results. Contact information: [place] [address] [phone]                Discharge Exam: Danley Danker Weights   02/22/22 0923  Weight: 99.8 kg   General exam: Appears calm and comfortable  Respiratory system: Clear to auscultation. Respiratory effort normal. Cardiovascular system: S1 & S2 heard, RRR. No JVD, murmurs, rubs, gallops or clicks. No pedal edema. Gastrointestinal system: Abdomen is nondistended, soft and nontender. No organomegaly or masses felt. Normal bowel sounds heard. Central nervous system: Alert and oriented. No focal neurological deficits. Extremities: Symmetric 5 x 5 power. Skin: No rashes, lesions or ulcers Psychiatry: Judgement and insight appear normal. Mood & affect appropriate.    Condition at discharge: fair  The results of significant diagnostics from this hospitalization (including imaging, microbiology, ancillary and laboratory) are listed below for  reference.   Imaging Studies: CT Abdomen Pelvis W Contrast  Result Date: 02/22/2022 CLINICAL DATA:  Abdominal pain EXAM: CT ABDOMEN AND PELVIS WITH CONTRAST TECHNIQUE: Multidetector CT imaging of the abdomen and pelvis was performed using the standard protocol following bolus administration of intravenous contrast. RADIATION DOSE REDUCTION: This exam was performed according to the departmental dose-optimization program which includes automated exposure control, adjustment of the mA and/or kV according to patient size and/or use of iterative reconstruction technique. CONTRAST:  148m OMNIPAQUE IOHEXOL 350 MG/ML SOLN COMPARISON:  01/17/2021 CT abdomen pelvis FINDINGS: Lower chest: No focal pulmonary opacity or pleural effusion. No pericardial effusion. Hepatobiliary: Diffusely decreased hepatic density with nodular contours, consistent with advancing cirrhosis with fibrosis. A hypoenhancing lesion is noted in the right hepatic lobe, measuring up to 2.2 cm (series 3, image 28)  which is incompletely evaluated and was not definitively present on the prior exam. The hepatic and portal veins are patent. No intra or extrahepatic biliary ductal dilatation. The gallbladder is without wall thickening or cholelithiasis. Pancreas: Unremarkable. No pancreatic ductal dilatation or surrounding inflammatory changes. Spleen: The spleen is enlarged, measuring up to 14.6 cm in craniocaudal dimension. A splenule is noted. Adrenals/Urinary Tract: The adrenal glands are unremarkable. The kidneys enhance symmetrically. No hydronephrosis or nephrolithiasis. The bladder is unremarkable for degree of distension. Stomach/Bowel: The stomach is within normal limits. At the site of the previously noted umbilical hernia, there may be adherent loops bowel, which do not appear obstructed. Wall thickening is noted in the ascending and proximal transverse colon (series 3, image 61), with mild pericolonic fat stranding. The bowel is otherwise  without evidence of wall thickening or distention. Diverticulosis without evidence of diverticulitis. The appendix is surgically absent. Vascular/Lymphatic: Moderate aortic atherosclerosis. Again noted are prominent portacaval nodes, favored to be reactive. Reproductive: Prostate is unremarkable. Other: Previously noted periumbilical hernia has been surgically repaired, with scarring and possible local adhesion given adjacent bowel loops but no obstruction. There is also increased density in this area secondary to venous collaterals. No significant free fluid. No free air in the abdomen or pelvis. Musculoskeletal: No acute or significant osseous findings. IMPRESSION: 1. Advancing cirrhosis with fibrosis, with a new 2.1 cm hypoenhancing lesion, which could represent washout in a hepatocellular carcinoma. A liver protocol MRI abdomen is recommended for further evaluation. 2. Colonic wall thickening and pericolonic stranding about the distal ascending and proximal transverse colon, which could represent infectious colitis, although colopathy from cirrhosis and portal vein hypertension can appear similar. Correlate with symptoms. 3. Status post interval umbilical hernia repair, with possible local adhesion given adjacent bowel loops but no evidence of obstruction. Electronically Signed   By: Merilyn Baba M.D.   On: 02/22/2022 12:28   MR LIVER W WO CONTRAST  Result Date: 02/23/2022 CLINICAL DATA:  51 year old male with history of cirrhosis and indeterminate liver lesion noted on recent CT examination. Follow-up study. EXAM: MRI ABDOMEN WITHOUT AND WITH CONTRAST TECHNIQUE: Multiplanar multisequence MR imaging of the abdomen was performed both before and after the administration of intravenous contrast. CONTRAST:  26m GADAVIST GADOBUTROL 1 MMOL/ML IV SOLN COMPARISON:  No prior abdominal MRI. CT the abdomen and pelvis 02/22/2022. FINDINGS: Lower chest: Unremarkable. Hepatobiliary: Liver has a very nodular contour  indicative of underlying cirrhosis. There is severe but heterogeneous loss of signal intensity throughout the hepatic parenchyma on out of phase dual echo images, indicative of a background of profound hepatic steatosis. Several small nodular areas are noted scattered throughout the hepatic parenchyma which are T1 hypointense and T2 hypointense, likely to represent regenerative nodules. No hypervascular hepatic lesion is confidently identified to clearly indicate the presence of hepatocellular carcinoma at this time. No intra or extrahepatic biliary ductal dilatation. Gallbladder is moderately distended. Gallbladder wall appears mildly edematous. No filling defect in the gallbladder to suggest gallstones. No intra or extrahepatic biliary ductal dilatation. Common bile duct measures 2 mm in the porta hepatis. No filling defect in the common bile duct to suggest choledocholithiasis. Pancreas: No pancreatic mass. No pancreatic ductal dilatation. No pancreatic or peripancreatic fluid collections or inflammatory changes. Spleen: Spleen is enlarged measuring 12.8 x 7.7 x 14.8 cm (estimated splenic volume of 729 mL) . Adrenals/Urinary Tract: Bilateral kidneys and adrenal glands are normal in appearance. No hydroureteronephrosis in the visualized portions of the abdomen. Stomach/Bowel: Visualized portions are unremarkable.  Vascular/Lymphatic: No aneurysm identified in the visualized abdominal vasculature. Circumaortic left renal vein (normal anatomical variant) incidentally noted. Portal vein is upper limits of normal (14 mm in diameter). No lymphadenopathy noted in the abdomen. Other:  Trace volume of perihepatic ascites.  No pneumoperitoneum. Musculoskeletal: No aggressive appearing osseous lesions are noted in the visualized portions of the skeleton. IMPRESSION: 1. Severe hepatic steatosis and hepatic cirrhosis, as above. This is associated with portal venous hypertension, as demonstrated by borderline dilated portal  vein and splenomegaly. No suspicious hepatic lesions are noted at this time to indicate hepatocellular carcinoma. 2. Trace volume of perihepatic ascites. 3. Gallbladder wall appears edematous. This is favored to be related to intrinsic hepatic disease. No gallstones are noted to suggest an acute cholecystitis at this time. Electronically Signed   By: Vinnie Langton M.D.   On: 02/23/2022 10:08   DG CHEST PORT 1 VIEW  Result Date: 02/22/2022 CLINICAL DATA:  History of cirrhosis with bloody stools, initial encounter EXAM: PORTABLE CHEST 1 VIEW COMPARISON:  10/08/2019 FINDINGS: The heart size and mediastinal contours are within normal limits. Both lungs are clear. The visualized skeletal structures are unremarkable. IMPRESSION: No active disease. Electronically Signed   By: Inez Catalina M.D.   On: 02/22/2022 17:17    Microbiology: Results for orders placed or performed during the hospital encounter of 11/15/21  Resp Panel by RT-PCR (Flu A&B, Covid) Nasopharyngeal Swab     Status: Abnormal   Collection Time: 11/15/21  8:55 PM   Specimen: Nasopharyngeal Swab; Nasopharyngeal(NP) swabs in vial transport medium  Result Value Ref Range Status   SARS Coronavirus 2 by RT PCR POSITIVE (A) NEGATIVE Final    Comment: (NOTE) SARS-CoV-2 target nucleic acids are DETECTED.  The SARS-CoV-2 RNA is generally detectable in upper respiratory specimens during the acute phase of infection. Positive results are indicative of the presence of the identified virus, but do not rule out bacterial infection or co-infection with other pathogens not detected by the test. Clinical correlation with patient history and other diagnostic information is necessary to determine patient infection status. The expected result is Negative.  Fact Sheet for Patients: EntrepreneurPulse.com.au  Fact Sheet for Healthcare Providers: IncredibleEmployment.be  This test is not yet approved or cleared by  the [place] FDA and  has been authorized for detection and/or diagnosis of SARS-CoV-2 by FDA under an Emergency Use Authorization (EUA).  This EUA will remain in effect (meaning this test can be used) for the duration of  the COVID-19 declaration under Section 564(b)(1) of the A ct, 21 U.S.C. section 360bbb-3(b)(1), unless the authorization is terminated or revoked sooner.     Influenza A by PCR NEGATIVE NEGATIVE Final   Influenza B by PCR NEGATIVE NEGATIVE Final    Comment: (NOTE) The Xpert Xpress SARS-CoV-2/FLU/RSV plus assay is intended as an aid in the diagnosis of influenza from Nasopharyngeal swab specimens and should not be used as a sole basis for treatment. Nasal washings and aspirates are unacceptable for Xpert Xpress SARS-CoV-2/FLU/RSV testing.  Fact Sheet for Patients: EntrepreneurPulse.com.au  Fact Sheet for Healthcare Providers: IncredibleEmployment.be  This test is not yet approved or cleared by the [place] FDA and has been authorized for detection and/or diagnosis of SARS-CoV-2 by FDA under an Emergency Use Authorization (EUA). This EUA will remain in effect (meaning this test can be used) for the duration of the COVID-19 declaration under Section 564(b)(1) of the Act, 21 U.S.C. section 360bbb-3(b)(1), unless the authorization is terminated or revoked.  Performed at  [facility], [address] [place], [place] [address]     Labs: CBC: Recent Labs  Lab 02/22/22 0930 02/22/22 2138 02/23/22 0324 02/25/22 0109 02/26/22 0334  WBC 7.5  --  7.1 7.7 7.4  NEUTROABS  --   --   --  4.7  --   HGB 15.6 14.8 13.9 13.6 14.0  HCT 43.5 40.6 38.8* 38.8* 38.8*  MCV 96.5  --  96.8 99.0 98.2  PLT 67*  --  45* 52* 60*   Basic Metabolic Panel: Recent Labs  Lab 02/22/22 0930 02/23/22 0324 02/24/22 1020 02/25/22 0109 02/26/22 0334  NA 127* 129* 130* 130* 128*  K 2.9* 3.2* 3.4* 3.9 4.0  CL 84* 91* 93* 95*  96*  CO2 '25 25 27 25 26  '$ GLUCOSE 117* 88 121* 96 95  BUN '7 10 10 10 15  '$ CREATININE 0.84 0.78 0.94 0.92 1.05  CALCIUM 8.4* 8.3* 8.7* 9.1 8.6*  MG 1.8  --   --   --   --   PHOS 3.2  --   --   --   --    Liver Function Tests: Recent Labs  Lab 02/22/22 0930 02/23/22 0324 02/24/22 1020 02/25/22 0109 02/26/22 0334  AST 333* 276* 235* 180* 160*  ALT 141* 117* 102* 88* 84*  ALKPHOS 234* 180* 197* 167* 172*  BILITOT 6.4* 8.5* 9.7* 7.0* 7.6*  PROT 7.7 7.1 7.5 6.6 6.9  ALBUMIN 3.2* 2.8* 2.8* 2.5* 2.7*   CBG: No results for input(s): GLUCAP in the last 168 hours.  Discharge time spent:39 min  Signed: Hosie Poisson, MD [facility] 02/26/2022

## 2022-02-26 NOTE — Op Note (Addendum)
[facility] Patient Name: Omar Turner Procedure Date : 02/26/2022 MRN: [id number] Attending MD: Mauri Pole , MD Date of Birth: October 27, 1970 CSN: [id number] Age: 51 Admit Type: Inpatient Procedure:                Colonoscopy Indications:              Evaluation of unexplained GI bleeding presenting                            with fecal occult blood, Unexplained iron                            deficiency anemia, Abnormal CT of the GI tract Providers:                Mauri Pole, MD, Mikey College, RN, Tyna Jaksch Technician, Darliss Cheney, Technician Referring MD:              Medicines:                Monitored Anesthesia Care Complications:            No immediate complications. Estimated Blood Loss:     Estimated blood loss was minimal. Procedure:                Pre-Anesthesia Assessment:                           - Prior to the procedure, a History and Physical                            was performed, and patient medications and                            allergies were reviewed. The patient's tolerance of                            previous anesthesia was also reviewed. The risks                            and benefits of the procedure and the sedation                            options and risks were discussed with the patient.                            All questions were answered, and informed consent                            was obtained. Prior Anticoagulants: The patient has                            taken no previous anticoagulant or antiplatelet  agents. ASA Grade Assessment: IV - A patient with                            severe systemic disease that is a constant threat                            to life. After reviewing the risks and benefits,                            the patient was deemed in satisfactory condition to                            undergo the procedure.                            After obtaining informed consent, the colonoscope                            was passed under direct vision. Throughout the                            procedure, the patient's blood pressure, pulse, and                            oxygen saturations were monitored continuously. The                            [id number]-[id number] ([id number]) Olympus peds colonscope was                            introduced through the anus and advanced to the the                            cecum, identified by appendiceal orifice and                            ileocecal valve. The colonoscopy was performed                            without difficulty. The patient tolerated the                            procedure well. The quality of the bowel                            preparation was adequate. The ileocecal valve,                            appendiceal orifice, and rectum were photographed. Scope In: 8:28:01 AM Scope Out: 8:48:52 AM Scope Withdrawal Time: 0 hours 16 minutes 12 seconds  Total Procedure Duration: 0 hours 20 minutes 51 seconds  Findings:      The perianal and digital rectal examinations were normal.      Three sessile polyps were found in the  sigmoid colon and ascending       colon. The polyps were 4 to 7 mm in size. These polyps were removed with       a cold snare. Resection and retrieval were complete.      Scattered small and large-mouthed diverticula were found in the sigmoid       colon.      Non-bleeding external and internal hemorrhoids were found during       retroflexion. The hemorrhoids were medium-sized. Impression:               - Three 4 to 7 mm polyps in the sigmoid colon and                            in the ascending colon, removed with a cold snare.                            Resected and retrieved.                           - Diverticulosis in the sigmoid colon.                           - Non-bleeding external and internal hemorrhoids. Recommendation:           - Patient  has a contact number available for                            emergencies. The signs and symptoms of potential                            delayed complications were discussed with the                            patient. Return to normal activities tomorrow.                            Written discharge instructions were provided to the                            patient.                           - Resume previous diet.                           - Continue present medications.                           - Await pathology results.                           - Repeat colonoscopy in 3 - 7 years for                            surveillance based on pathology results. Procedure Code(s):        --- Professional ---  45385, Colonoscopy, flexible; with removal of                            tumor(s), polyp(s), or other lesion(s) by snare                            technique Diagnosis Code(s):        --- Professional ---                           K63.5, Polyp of colon                           K64.8, Other hemorrhoids                           R19.5, Other fecal abnormalities                           D50.9, Iron deficiency anemia, unspecified                           K57.30, Diverticulosis of large intestine without                            perforation or abscess without bleeding                           R93.3, Abnormal findings on diagnostic imaging of                            other parts of digestive tract CPT copyright 2019 American Medical Association. All rights reserved. The codes documented in this report are preliminary and upon coder review may  be revised to meet current compliance requirements. Mauri Pole, MD 02/26/2022 9:17:11 AM This report has been signed electronically. Number of Addenda: 0

## 2022-02-26 NOTE — Transfer of Care (Signed)
Immediate Anesthesia Transfer of Care Note  Patient: Omar Turner  Procedure(s) Performed: ESOPHAGOGASTRODUODENOSCOPY (EGD) WITH PROPOFOL COLONOSCOPY WITH PROPOFOL  Patient Location: Endoscopy Unit  Anesthesia Type:MAC  Level of Consciousness: awake, drowsy and patient cooperative  Airway & Oxygen Therapy: Patient Spontanous Breathing  Post-op Assessment: Report given to RN and Post -op Vital signs reviewed and stable  Post vital signs: Reviewed and stable  Last Vitals:  Vitals Value Taken Time  BP 99/65 02/26/22 0858  Temp 36.4 C 02/26/22 0855  Pulse 88 02/26/22 0900  Resp 16 02/26/22 0900  SpO2 96 % 02/26/22 0900  Vitals shown include unvalidated device data.  Last Pain:  Vitals:   02/26/22 0855  TempSrc:   PainSc: Asleep      Patients Stated Pain Goal: 3 (79/15/05 6979)  Complications: No notable events documented.

## 2022-02-27 ENCOUNTER — Encounter (HOSPITAL_COMMUNITY): Payer: Self-pay | Admitting: Gastroenterology

## 2022-02-27 LAB — SURGICAL PATHOLOGY

## 2022-05-30 ENCOUNTER — Other Ambulatory Visit: Payer: Self-pay | Admitting: Gastroenterology

## 2022-05-30 DIAGNOSIS — K766 Portal hypertension: Secondary | ICD-10-CM

## 2022-05-30 DIAGNOSIS — K7030 Alcoholic cirrhosis of liver without ascites: Secondary | ICD-10-CM

## 2022-05-30 DIAGNOSIS — K3189 Other diseases of stomach and duodenum: Secondary | ICD-10-CM

## 2022-05-30 DIAGNOSIS — I8510 Secondary esophageal varices without bleeding: Secondary | ICD-10-CM

## 2022-06-13 ENCOUNTER — Encounter: Payer: Self-pay | Admitting: Gastroenterology

## 2022-11-04 ENCOUNTER — Ambulatory Visit: Payer: BC Managed Care – PPO | Admitting: Physician Assistant

## 2022-11-04 ENCOUNTER — Encounter: Payer: Self-pay | Admitting: Physician Assistant

## 2022-11-04 VITALS — BP 120/67 | HR 84 | Temp 98.7°F | Resp 18 | Ht 70.0 in | Wt 220.0 lb

## 2022-11-04 DIAGNOSIS — J069 Acute upper respiratory infection, unspecified: Secondary | ICD-10-CM | POA: Diagnosis not present

## 2022-11-04 DIAGNOSIS — J209 Acute bronchitis, unspecified: Secondary | ICD-10-CM | POA: Diagnosis not present

## 2022-11-04 LAB — POCT INFLUENZA A/B

## 2022-11-04 MED ORDER — AZITHROMYCIN 250 MG PO TABS: ORAL_TABLET | ORAL | 0 refills | Status: DC

## 2022-11-04 NOTE — Progress Notes (Signed)
   Subjective:    Patient ID: Omar Turner, male    DOB: [birth year], 52 y.o.   MRN: [id number]  Cough This is a new problem. The current episode started in the past 7 days. The problem has been gradually worsening. The problem occurs every few minutes. The cough is Productive of sputum. Associated symptoms include chills, headaches, myalgias, nasal congestion, postnasal drip and wheezing. Pertinent negatives include no fever, hemoptysis, rash, sore throat or sweats. Nothing aggravates the symptoms. Risk factors for lung disease include smoking/tobacco exposure. He has tried rest and cool air for the symptoms. The treatment provided no relief. His past medical history is significant for bronchitis. There is no history of asthma.      Review of Systems  Constitutional:  Positive for chills. Negative for fever.  HENT:  Positive for congestion, postnasal drip and sinus pressure. Negative for sore throat.   Respiratory:  Positive for cough and wheezing. Negative for hemoptysis.   Gastrointestinal:  Positive for diarrhea.  Musculoskeletal:  Positive for myalgias.  Skin:  Negative for rash.  Neurological:  Positive for headaches.       Objective:   Physical Exam HENT:     Head: Normocephalic and atraumatic.     Right Ear: Tympanic membrane and ear canal normal.     Left Ear: Tympanic membrane and ear canal normal.     Nose: Congestion present.     Mouth/Throat:     Mouth: Mucous membranes are moist.     Pharynx: No oropharyngeal exudate.     Comments: Uvula mild to moderately enlarged. Airway patent Eyes:     Extraocular Movements: Extraocular movements intact.     Conjunctiva/sclera: Conjunctivae normal.     Pupils: Pupils are equal, round, and reactive to light.  Cardiovascular:     Rate and Rhythm: Normal rate.  Pulmonary:     Effort: No respiratory distress.     Breath sounds: Rhonchi present. No wheezing.  Abdominal:     Palpations: Abdomen is soft.  Musculoskeletal:      Cervical back: Normal range of motion and neck supple. No rigidity.  Skin:    General: Skin is warm and dry.     Findings: No rash.  Neurological:     General: No focal deficit present.     Mental Status: He is alert.           Assessment & Plan: Acute Bronchitis Upper respiratory Infection   Plan: Symptoms and exam suggest acute bronchitis and upper respiratory infection. Influenza test and COVID 19 test are negative today. Oxygen level is 97% today.  Please increase fluids. Wash hands frequently. Use mask until symptoms have resolved. Refrain from smoking. Use Delsym every 12 hours for cough. Use Zithromax Z-Pak as prescribed. Use Allegra D every 12 hours for congestion.  Please return to the clinic if not improving.

## 2022-11-04 NOTE — Patient Instructions (Signed)
Symptoms and exam suggest acute bronchitis and upper respiratory infection. Influenza test and COVID 19 test are negative today. Oxygen level is 97% today.  Please increase fluids. Wash hands frequently. Use mask until symptoms have resolved. Refrain from smoking. Use Delsym every 12 hours for cough. Use Zithromax Z-Pak as prescribed. Use Allegra D every 12 hours for congestion.  Please return to the clinic if not improving.

## 2022-11-26 ENCOUNTER — Ambulatory Visit: Payer: BC Managed Care – PPO | Admitting: Dermatology

## 2022-11-26 DIAGNOSIS — Z808 Family history of malignant neoplasm of other organs or systems: Secondary | ICD-10-CM

## 2022-11-26 DIAGNOSIS — Z85828 Personal history of other malignant neoplasm of skin: Secondary | ICD-10-CM

## 2022-11-26 DIAGNOSIS — C4492 Squamous cell carcinoma of skin, unspecified: Secondary | ICD-10-CM

## 2022-11-26 DIAGNOSIS — D2271 Melanocytic nevi of right lower limb, including hip: Secondary | ICD-10-CM | POA: Diagnosis not present

## 2022-11-26 DIAGNOSIS — C44519 Basal cell carcinoma of skin of other part of trunk: Secondary | ICD-10-CM

## 2022-11-26 DIAGNOSIS — D229 Melanocytic nevi, unspecified: Secondary | ICD-10-CM

## 2022-11-26 DIAGNOSIS — L814 Other melanin hyperpigmentation: Secondary | ICD-10-CM

## 2022-11-26 DIAGNOSIS — D225 Melanocytic nevi of trunk: Secondary | ICD-10-CM

## 2022-11-26 DIAGNOSIS — L570 Actinic keratosis: Secondary | ICD-10-CM | POA: Diagnosis not present

## 2022-11-26 DIAGNOSIS — C4441 Basal cell carcinoma of skin of scalp and neck: Secondary | ICD-10-CM

## 2022-11-26 DIAGNOSIS — D489 Neoplasm of uncertain behavior, unspecified: Secondary | ICD-10-CM

## 2022-11-26 DIAGNOSIS — L720 Epidermal cyst: Secondary | ICD-10-CM

## 2022-11-26 DIAGNOSIS — D045 Carcinoma in situ of skin of trunk: Secondary | ICD-10-CM | POA: Diagnosis not present

## 2022-11-26 DIAGNOSIS — L820 Inflamed seborrheic keratosis: Secondary | ICD-10-CM | POA: Diagnosis not present

## 2022-11-26 DIAGNOSIS — C4491 Basal cell carcinoma of skin, unspecified: Secondary | ICD-10-CM

## 2022-11-26 DIAGNOSIS — L578 Other skin changes due to chronic exposure to nonionizing radiation: Secondary | ICD-10-CM

## 2022-11-26 DIAGNOSIS — Z1283 Encounter for screening for malignant neoplasm of skin: Secondary | ICD-10-CM | POA: Diagnosis not present

## 2022-11-26 HISTORY — DX: Basal cell carcinoma of skin, unspecified: C44.91

## 2022-11-26 HISTORY — DX: Squamous cell carcinoma of skin, unspecified: C44.92

## 2022-11-26 HISTORY — DX: Melanocytic nevi, unspecified: D22.9

## 2022-11-26 NOTE — Patient Instructions (Addendum)
Biopsy Wound Care Instructions  Leave the original bandage on for 24 hours if possible.  If the bandage becomes soaked or soiled before that time, it is OK to remove it and examine the wound.  A small amount of post-operative bleeding is normal.  If excessive bleeding occurs, remove the bandage, place gauze over the site and apply continuous pressure (no peeking) over the area for 30 minutes. If this does not work, please call our clinic as soon as possible or page your doctor if it is after hours.   Once a day, cleanse the wound with soap and water. It is fine to shower. If a thick crust develops you may use a Q-tip dipped into dilute hydrogen peroxide (mix 1:1 with water) to dissolve it.  Hydrogen peroxide can slow the healing process, so use it only as needed.    After washing, apply petroleum jelly (Vaseline) or an antibiotic ointment if your doctor prescribed one for you, followed by a bandage.    For best healing, the wound should be covered with a layer of ointment at all times. If you are not able to keep the area covered with a bandage to hold the ointment in place, this may mean re-applying the ointment several times a day.  Continue this wound care until the wound has healed and is no longer open.   Itching and mild discomfort is normal during the healing process. However, if you develop pain or severe itching, please call our office.   If you have any discomfort, you can take Tylenol (acetaminophen) or ibuprofen as directed on the bottle. (Please do not take these if you have an allergy to them or cannot take them for another reason).  Some redness, tenderness and white or yellow material in the wound is normal healing.  If the area becomes very sore and red, or develops a thick yellow-green material (pus), it may be infected; please notify us.    If you have stitches, return to clinic as directed to have the stitches removed. You will continue wound care for 2-3 days after the  stitches are removed.   Wound healing continues for up to one year following surgery. It is not unusual to experience pain in the scar from time to time during the interval.  If the pain becomes severe or the scar thickens, you should notify the office.    A slight amount of redness in a scar is expected for the first six months.  After six months, the redness will fade and the scar will soften and fade.  The color difference becomes less noticeable with time.  If there are any problems, return for a post-op surgery check at your earliest convenience.  To improve the appearance of the scar, you can use silicone scar gel, cream, or sheets (such as Mederma or Serica) every night for up to one year. These are available over the counter (without a prescription).  Please call our office at [phone] for any questions or concerns.  Seborrheic Keratosis  What causes seborrheic keratoses? Seborrheic keratoses are harmless, common skin growths that first appear during adult life.  As time goes by, more growths appear.  Some people may develop a large number of them.  Seborrheic keratoses appear on both covered and uncovered body parts.  They are not caused by sunlight.  The tendency to develop seborrheic keratoses can be inherited.  They vary in color from skin-colored to gray, brown, or even black.  They  can be either smooth or have a rough, warty surface.   Seborrheic keratoses are superficial and look as if they were stuck on the skin.  Under the microscope this type of keratosis looks like layers upon layers of skin.  That is why at times the top layer may seem to fall off, but the rest of the growth remains and re-grows.    Treatment Seborrheic keratoses do not need to be treated, but can easily be removed in the office.  Seborrheic keratoses often cause symptoms when they rub on clothing or jewelry.  Lesions can be in the way of shaving.  If they become inflamed, they can cause itching, soreness,  or burning.  Removal of a seborrheic keratosis can be accomplished by freezing, burning, or surgery. If any spot bleeds, scabs, or grows rapidly, please return to have it checked, as these can be an indication of a skin cancer.    Cryotherapy Aftercare  Wash gently with soap and water everyday.   Apply Vaseline and Band-Aid daily until healed.     Actinic keratoses are precancerous spots that appear secondary to cumulative UV radiation exposure/sun exposure over time. They are chronic with expected duration over 1 year. A portion of actinic keratoses will progress to squamous cell carcinoma of the skin. It is not possible to reliably predict which spots will progress to skin cancer and so treatment is recommended to prevent development of skin cancer.  Recommend daily broad spectrum sunscreen SPF 30+ to sun-exposed areas, reapply every 2 hours as needed.  Recommend staying in the shade or wearing long sleeves, sun glasses (UVA+UVB protection) and wide brim hats (4-inch brim around the entire circumference of the hat). Call for new or changing lesions.      Melanoma ABCDEs  Melanoma is the most dangerous type of skin cancer, and is the leading cause of death from skin disease.  You are more likely to develop melanoma if you: Have light-colored skin, light-colored eyes, or red or blond hair Spend a lot of time in the sun Tan regularly, either outdoors or in a tanning bed Have had blistering sunburns, especially during childhood Have a close family member who has had a melanoma Have atypical moles or large birthmarks  Early detection of melanoma is key since treatment is typically straightforward and cure rates are extremely high if we catch it early.   The first sign of melanoma is often a change in a mole or a new dark spot.  The ABCDE system is a way of remembering the signs of melanoma.  A for asymmetry:  The two halves do not match. B for border:  The edges of the growth are  irregular. C for color:  A mixture of colors are present instead of an even brown color. D for diameter:  Melanomas are usually (but not always) greater than 43m - the size of a pencil eraser. E for evolution:  The spot keeps changing in size, shape, and color.  Please check your skin once per month between visits. You can use a small mirror in front and a large mirror behind you to keep an eye on the back side or your body.   If you see any new or changing lesions before your next follow-up, please call to schedule a visit.  Please continue daily skin protection including broad spectrum sunscreen SPF 30+ to sun-exposed areas, reapplying every 2 hours as needed when you're outdoors.   Staying in the shade or wearing long sleeves,  sun glasses (UVA+UVB protection) and wide brim hats (4-inch brim around the entire circumference of the hat) are also recommended for sun protection.    Due to recent changes in healthcare laws, you may see results of your pathology and/or laboratory studies on MyChart before the doctors have had a chance to review them. We understand that in some cases there may be results that are confusing or concerning to you. Please understand that not all results are received at the same time and often the doctors may need to interpret multiple results in order to provide you with the best plan of care or course of treatment. Therefore, we ask that you please give [place] 2 business days to thoroughly review all your results before contacting the office for clarification. Should we see a critical lab result, you will be contacted sooner.   If You Need Anything After Your Visit  If you have any questions or concerns for your doctor, please call our main line at [phone] and press option 4 to reach your doctor's medical assistant. If no one answers, please leave a voicemail as directed and we will return your call as soon as possible. Messages left after 4 pm will be answered the  following business day.   You may also send [place] a message via [place]. We typically respond to MyChart messages within 1-2 business days.  For prescription refills, please ask your pharmacy to contact our office. Our fax number is [phone].  If you have an urgent issue when the clinic is closed that cannot wait until the next business day, you can page your doctor at the number below.    Please note that while we do our best to be available for urgent issues outside of office hours, we are not available 24/7.   If you have an urgent issue and are unable to reach [place], you may choose to seek medical care at your doctor's office, retail clinic, urgent care center, or emergency room.  If you have a medical emergency, please immediately call 911 or go to the emergency department.  Pager Numbers  - Dr. Nehemiah Massed: [phone]  - Dr. Laurence Ferrari: [phone]  - Dr. Nicole Kindred: [phone]  In the event of inclement weather, please call our main line at [phone] for an update on the status of any delays or closures.  Dermatology Medication Tips: Please keep the boxes that topical medications come in in order to help keep track of the instructions about where and how to use these. Pharmacies typically print the medication instructions only on the boxes and not directly on the medication tubes.   If your medication is too expensive, please contact our office at [phone] option 4 or send [place] a message through [place].   We are unable to tell what your co-pay for medications will be in advance as this is different depending on your insurance coverage. However, we may be able to find a substitute medication at lower cost or fill out paperwork to get insurance to cover a needed medication.   If a prior authorization is required to get your medication covered by your insurance company, please allow [place] 1-2 business days to complete this process.  Drug prices often vary depending on where the  prescription is filled and some pharmacies may offer cheaper prices.  The website www.goodrx.com contains coupons for medications through different pharmacies. The prices here do not account for what the cost may be with help from insurance (it may be cheaper with your insurance),  but the website can give you the price if you did not use any insurance.  - You can print the associated coupon and take it with your prescription to the pharmacy.  - You may also stop by our office during regular business hours and pick up a GoodRx coupon card.  - If you need your prescription sent electronically to a different pharmacy, notify our office through [facility] or by phone at [phone] option 4.     Si Usted Necesita Algo Despus de Su Visita  Tambin puede enviarnos un mensaje a travs de Pharmacist, community. Por lo general respondemos a los mensajes de MyChart en el transcurso de 1 a 2 das hbiles.  Para renovar recetas, por favor pida a su farmacia que se ponga en contacto con nuestra oficina. Harland Dingwall de fax es [place] [phone].  Si tiene un asunto urgente cuando la clnica est cerrada y que no puede esperar hasta el siguiente da hbil, puede llamar/localizar a su doctor(a) al nmero que aparece a continuacin.   Por favor, tenga en cuenta que aunque hacemos todo lo posible para estar disponibles para asuntos urgentes fuera del horario de [place], no estamos disponibles las 24 horas del da, los 7 das de la [place].   Si tiene un problema urgente y no puede comunicarse con nosotros, puede optar por buscar atencin mdica  en el consultorio de su doctor(a), en una clnica privada, en un centro de atencin urgente o en una sala de emergencias.  Si tiene Engineering geologist, por favor llame inmediatamente al 911 o vaya a la sala de emergencias.  Nmeros de bper  - Dr. Nehemiah Massed: [phone]  - Dra. Moye: [phone]  - Dra. Nicole Kindred: [phone]  En caso de inclemencias del [place],  por favor llame a Johnsie Kindred principal al [phone] para una actualizacin sobre el [place] de cualquier retraso o cierre.  Consejos para la medicacin en dermatologa: Por favor, guarde las cajas en las que vienen los medicamentos de uso tpico para ayudarle a seguir las instrucciones sobre dnde y cmo usarlos. Las farmacias generalmente imprimen las instrucciones del medicamento slo en las cajas y no directamente en los tubos del [place].   Si su medicamento es muy caro, por favor, pngase en contacto con Zigmund Daniel llamando al [phone] y presione la opcin 4 o envenos un mensaje a travs de Pharmacist, community.   No podemos decirle cul ser su copago por los medicamentos por adelantado ya que esto es diferente dependiendo de la cobertura de su seguro. Sin embargo, es posible que podamos encontrar un medicamento sustituto a Electrical engineer un formulario para que el seguro cubra el medicamento que se considera necesario.   Si se requiere una autorizacin previa para que su compaa de seguros [place] su medicamento, por favor permtanos de 1 a 2 das hbiles para completar este proceso.  Los precios de los medicamentos varan con frecuencia dependiendo del Environmental consultant de dnde se surte la receta y alguna farmacias pueden ofrecer precios ms baratos.  El sitio web www.goodrx.com tiene cupones para medicamentos de Airline pilot. Los precios aqu no tienen en cuenta lo que podra costar con la ayuda del seguro (puede ser ms barato con su seguro), pero el sitio web puede darle el precio si no utiliz Research scientist (physical sciences).  - Puede imprimir el cupn correspondiente y llevarlo con su receta a la farmacia.  - Tambin puede pasar por nuestra oficina durante el horario de atencin regular y Charity fundraiser una tarjeta de cupones de  GoodRx.  - Si necesita que su receta se enve electrnicamente a una farmacia diferente, informe a nuestra oficina a travs de MyChart de [place] o por telfono llamando al  [phone] y presione la opcin 4.

## 2022-11-26 NOTE — Progress Notes (Signed)
Follow-Up Visit   Subjective  Omar Turner is a 52 y.o. male who presents for the following: New Patient (Initial Visit) (Tbse, patient reports history of skin cancer and multiple biopsies in the past. Patient also reports family history of skin cancer in mother. Patient reports some spots at back, left chest, left upper arm.). He has some spots that get irritated by shaving at L neck/jaw area.   The patient presents for Total-Body Skin Exam (TBSE) for skin cancer screening and mole check.  The patient has spots, moles and lesions to be evaluated, some may be new or changing and the patient has concerns that these could be cancer.   The following portions of the chart were reviewed this encounter and updated as appropriate:      Review of Systems: No other skin or systemic complaints except as noted in HPI or Assessment and Plan.   Objective  Well appearing patient in no apparent distress; mood and affect are within normal limits.  A full examination was performed including scalp, head, eyes, ears, nose, lips, neck, chest, axillae, abdomen, back, buttocks, bilateral upper extremities, bilateral lower extremities, hands, feet, fingers, toes, fingernails, and toenails. All findings within normal limits unless otherwise noted below.  left upper mid back 9 mm pink scaly papule        left spinal upper back 7 mm pink white macule with telangectasia        right spinal mid back 5 mm brown speckled macule        left posterior neck 6 mm pink pearly papule          left upper arm and left chest 4 areas see photos             left jaw and left neck x 5 (5) Erythematous stuck-on, waxy papule  mid crown scalp x 1 Erythematous thin papules/macules with gritty scale.   right posterior thigh 3 mm medium dark brown macule   left nasal dorsum Firm blue/white punctum c/w cyst, large dilated pore/blackhead   super pubic area Violaceous scar, pt  states had cyst that has since healed   Assessment & Plan  Neoplasm of uncertain behavior (5) left upper mid back  Epidermal / dermal shaving  Lesion diameter (cm):  0.9 Informed consent: discussed and consent obtained   Patient was prepped and draped in usual sterile fashion: Area prepped with alcohol. Anesthesia: the lesion was anesthetized in a standard fashion   Anesthetic:  1% lidocaine w/ epinephrine 1-100,000 buffered w/ 8.4% NaHCO3 Instrument used: flexible razor blade   Hemostasis achieved with: pressure, aluminum chloride and electrodesiccation   Outcome: patient tolerated procedure well   Post-procedure details: wound care instructions given   Post-procedure details comment:  Ointment and small bandage applied.   Specimen 1 - Surgical pathology Differential Diagnosis: Isk r/o bcc vs scc  Check Margins: No  left spinal upper back  Epidermal / dermal shaving  Lesion diameter (cm):  0.7 Informed consent: discussed and consent obtained   Patient was prepped and draped in usual sterile fashion: Area prepped with alcohol. Anesthesia: the lesion was anesthetized in a standard fashion   Anesthetic:  1% lidocaine w/ epinephrine 1-100,000 buffered w/ 8.4% NaHCO3 Instrument used: flexible razor blade   Hemostasis achieved with: pressure, aluminum chloride and electrodesiccation   Outcome: patient tolerated procedure well   Post-procedure details: wound care instructions given   Post-procedure details comment:  Ointment and small bandage applied.   Specimen 2 -  Surgical pathology Differential Diagnosis: R/o superficial bcc  Check Margins: No  right spinal mid back  Epidermal / dermal shaving  Lesion diameter (cm):  0.6 Informed consent: discussed and consent obtained   Patient was prepped and draped in usual sterile fashion: Area prepped with alcohol. Anesthesia: the lesion was anesthetized in a standard fashion   Anesthetic:  1% lidocaine w/ epinephrine 1-100,000  buffered w/ 8.4% NaHCO3 Instrument used: flexible razor blade   Hemostasis achieved with: pressure, aluminum chloride and electrodesiccation   Outcome: patient tolerated procedure well   Post-procedure details: wound care instructions given   Post-procedure details comment:  Ointment and small bandage applied.   Specimen 3 - Surgical pathology Differential Diagnosis: Nevus r/o dysplasia  Check Margins: No  left posterior neck  Epidermal / dermal shaving  Lesion diameter (cm):  0.6 Informed consent: discussed and consent obtained   Patient was prepped and draped in usual sterile fashion: Area prepped with alcohol. Anesthesia: the lesion was anesthetized in a standard fashion   Anesthetic:  1% lidocaine w/ epinephrine 1-100,000 buffered w/ 8.4% NaHCO3 Instrument used: flexible razor blade   Hemostasis achieved with: pressure, aluminum chloride and electrodesiccation   Outcome: patient tolerated procedure well   Post-procedure details: wound care instructions given   Post-procedure details comment:  Ointment and small bandage applied.   Specimen 4 - Surgical pathology Differential Diagnosis: R/o bcc  Check Margins: No  left upper arm and left chest  Isk r/o bcc vs scc at left mid upper back R/o superficial bcc at left spinal upper back Nevus r/o dysplasia right spinal mid back R/o bcc left posterior neck  Several other locations at left chest and left arm probable BCCs, will treat with bx and ED&C at f/up visit  Inflamed seborrheic keratosis (5) left jaw and left neck x 5  Symptomatic, irritating, patient would like treated.  Destruction of lesion - left jaw and left neck x 5  Destruction method: cryotherapy   Informed consent: discussed and consent obtained   Lesion destroyed using liquid nitrogen: Yes   Region frozen until ice ball extended beyond lesion: Yes   Outcome: patient tolerated procedure well with no complications   Post-procedure details: wound care  instructions given   Additional details:  Prior to procedure, discussed risks of blister formation, small wound, skin dyspigmentation, or rare scar following cryotherapy. Recommend Vaseline ointment to treated areas while healing.   Actinic keratosis mid crown scalp x 1  Actinic keratoses are precancerous spots that appear secondary to cumulative UV radiation exposure/sun exposure over time. They are chronic with expected duration over 1 year. A portion of actinic keratoses will progress to squamous cell carcinoma of the skin. It is not possible to reliably predict which spots will progress to skin cancer and so treatment is recommended to prevent development of skin cancer.  Recommend daily broad spectrum sunscreen SPF 30+ to sun-exposed areas, reapply every 2 hours as needed.  Recommend staying in the shade or wearing long sleeves, sun glasses (UVA+UVB protection) and wide brim hats (4-inch brim around the entire circumference of the hat). Call for new or changing lesions.  Destruction of lesion - mid crown scalp x 1  Destruction method: cryotherapy   Informed consent: discussed and consent obtained   Lesion destroyed using liquid nitrogen: Yes   Region frozen until ice ball extended beyond lesion: Yes   Outcome: patient tolerated procedure well with no complications   Post-procedure details: wound care instructions given   Additional details:  Prior to procedure, discussed risks of blister formation, small wound, skin dyspigmentation, or rare scar following cryotherapy. Recommend Vaseline ointment to treated areas while healing.   Nevus right posterior thigh  Benign-appearing.  Observation.  Call clinic for new or changing lesions.  Recommend daily use of broad spectrum spf 30+ sunscreen to sun-exposed areas.    Epidermal inclusion cyst (2) super pubic area; left nasal dorsum  Benign-appearing. Exam most consistent with an epidermal inclusion cyst. Discussed that a cyst is a benign  growth that can grow over time and sometimes get irritated or inflamed. Recommend observation if it is not bothersome. Discussed option of surgical excision to remove it if it is growing, symptomatic, or other changes noted. Please call for new or changing lesions so they can be evaluated.  Discussed punch excision at nose, but would leave scar  Hx of inflammation at super pubic area- cyst appears resolved   Lentigines - Scattered tan macules - Due to sun exposure - Benign-appearing, observe - Recommend daily broad spectrum sunscreen SPF 30+ to sun-exposed areas, reapply every 2 hours as needed. - Call for any changes  Seborrheic Keratoses - Stuck-on, waxy, tan-brown papules and/or plaques  - Benign-appearing - Discussed benign etiology and prognosis. - Observe - Call for any changes  Melanocytic Nevi - Tan-brown and/or pink-flesh-colored symmetric macules and papules - Benign appearing on exam today - Observation - Call clinic for new or changing moles - Recommend daily use of broad spectrum spf 30+ sunscreen to sun-exposed areas.   Hemangiomas - Red papules - Discussed benign nature - Observe - Call for any changes  Actinic Damage - Chronic condition, secondary to cumulative UV/sun exposure - diffuse scaly erythematous macules with underlying dyspigmentation - Recommend daily broad spectrum sunscreen SPF 30+ to sun-exposed areas, reapply every 2 hours as needed.  - Staying in the shade or wearing long sleeves, sun glasses (UVA+UVB protection) and wide brim hats (4-inch brim around the entire circumference of the hat) are also recommended for sun protection.  - Call for new or changing lesions.  History of Basal Cell Carcinoma of the Skin Awaiting records from Dr. Phillip Heal  - No evidence of recurrence today - Recommend regular full body skin exams - Recommend daily broad spectrum sunscreen SPF 30+ to sun-exposed areas, reapply every 2 hours as needed.  - Call if any new  or changing lesions are noted between office visits  Family history of skin cancer - what type(s): unknown? - who affected: mother  Skin cancer screening performed today. Return for schedule bx and Edc feb 29 for 30 min slot PM r per Dr. Sharman Crate, Ruthell Rummage, CMA, am acting as scribe for Brendolyn Patty, MD.  Documentation: I have reviewed the above documentation for accuracy and completeness, and I agree with the above.  Brendolyn Patty MD

## 2022-12-02 ENCOUNTER — Telehealth: Payer: Self-pay

## 2022-12-02 NOTE — Telephone Encounter (Signed)
-----   Message from Brendolyn Patty, MD sent at 12/02/2022 12:58 PM EST ----- 1. 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 Skin , left spinal upper back SUPERFICIAL BASAL CELL CARCINOMA 3. 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 Skin , left posterior neck BASAL CELL CARCINOMA WITH FOCAL SCLEROSIS, SEE DESCRIPTION  1. SCCIS skin cancer- needs EDC 2. Superficial BCC skin cancer- needs EDC 3. Mod/severe atypical mole- needs repeat shave removal 4. BCC skin cancer- needs EDC  He has upcoming appointment for additional bx/EDCs.  He will need another appointment to treat these.   - please call patient

## 2022-12-02 NOTE — Telephone Encounter (Signed)
Advised patient of biopsy results. EDC x 3 and shave removal x 1 scheduled 12/30/2022 at 10:00 AM.

## 2022-12-04 ENCOUNTER — Ambulatory Visit: Payer: BC Managed Care – PPO | Admitting: Dermatology

## 2022-12-04 VITALS — BP 120/71 | HR 82

## 2022-12-04 DIAGNOSIS — D492 Neoplasm of unspecified behavior of bone, soft tissue, and skin: Secondary | ICD-10-CM

## 2022-12-04 DIAGNOSIS — L578 Other skin changes due to chronic exposure to nonionizing radiation: Secondary | ICD-10-CM

## 2022-12-04 DIAGNOSIS — C44519 Basal cell carcinoma of skin of other part of trunk: Secondary | ICD-10-CM

## 2022-12-04 DIAGNOSIS — C44619 Basal cell carcinoma of skin of left upper limb, including shoulder: Secondary | ICD-10-CM | POA: Diagnosis not present

## 2022-12-04 NOTE — Patient Instructions (Signed)
Wound Care Instructions  Cleanse wound gently with soap and water once a day then pat dry with clean gauze. Apply a thin coat of Petrolatum (petroleum jelly, "Vaseline") over the wound (unless you have an allergy to this). We recommend that you use a new, sterile tube of Vaseline. Do not pick or remove scabs. Do not remove the yellow or white "healing tissue" from the base of the wound.  Cover the wound with fresh, clean, nonstick gauze and secure with paper tape. You may use Band-Aids in place of gauze and tape if the wound is small enough, but would recommend trimming much of the tape off as there is often too much. Sometimes Band-Aids can irritate the skin.  You should call the office for your biopsy report after 1 week if you have not already been contacted.  If you experience any problems, such as abnormal amounts of bleeding, swelling, significant bruising, significant pain, or evidence of infection, please call the office immediately.  FOR ADULT SURGERY PATIENTS: If you need something for pain relief you may take 1 extra strength Tylenol (acetaminophen) AND 2 Ibuprofen ('200mg'$  each) together every 4 hours as needed for pain. (do not take these if you are allergic to them or if you have a reason you should not take them.) Typically, you may only need pain medication for 1 to 3 days.      Recommend daily broad spectrum sunscreen SPF 30+ to sun-exposed areas, reapply every 2 hours as needed. Call for new or changing lesions.  Staying in the shade or wearing long sleeves, sun glasses (UVA+UVB protection) and wide brim hats (4-inch brim around the entire circumference of the hat) are also recommended for sun protection.     Due to recent changes in healthcare laws, you may see results of your pathology and/or laboratory studies on MyChart before the doctors have had a chance to review them. We understand that in some cases there may be results that are confusing or concerning to you. Please  understand that not all results are received at the same time and often the doctors may need to interpret multiple results in order to provide you with the best plan of care or course of treatment. Therefore, we ask that you please give [place] 2 business days to thoroughly review all your results before contacting the office for clarification. Should we see a critical lab result, you will be contacted sooner.   If You Need Anything After Your Visit  If you have any questions or concerns for your doctor, please call our main line at [phone] and press option 4 to reach your doctor's medical assistant. If no one answers, please leave a voicemail as directed and we will return your call as soon as possible. Messages left after 4 pm will be answered the following business day.   You may also send [place] a message via [place]. We typically respond to MyChart messages within 1-2 business days.  For prescription refills, please ask your pharmacy to contact our office. Our fax number is [phone].  If you have an urgent issue when the clinic is closed that cannot wait until the next business day, you can page your doctor at the number below.    Please note that while we do our best to be available for urgent issues outside of office hours, we are not available 24/7.   If you have an urgent issue and are unable to reach [place], you may choose to seek medical care at  your doctor's office, retail clinic, urgent care center, or emergency room.  If you have a medical emergency, please immediately call 911 or go to the emergency department.  Pager Numbers  - Dr. Nehemiah Massed: [phone]  - Dr. Laurence Ferrari: [phone]  - Dr. Nicole Kindred: [phone]  In the event of inclement weather, please call our main line at [phone] for an update on the status of any delays or closures.  Dermatology Medication Tips: Please keep the boxes that topical medications come in in order to help keep track of the instructions about  where and how to use these. Pharmacies typically print the medication instructions only on the boxes and not directly on the medication tubes.   If your medication is too expensive, please contact our office at [phone] option 4 or send [place] a message through [place].   We are unable to tell what your co-pay for medications will be in advance as this is different depending on your insurance coverage. However, we may be able to find a substitute medication at lower cost or fill out paperwork to get insurance to cover a needed medication.   If a prior authorization is required to get your medication covered by your insurance company, please allow [place] 1-2 business days to complete this process.  Drug prices often vary depending on where the prescription is filled and some pharmacies may offer cheaper prices.  The website www.goodrx.com contains coupons for medications through different pharmacies. The prices here do not account for what the cost may be with help from insurance (it may be cheaper with your insurance), but the website can give you the price if you did not use any insurance.  - You can print the associated coupon and take it with your prescription to the pharmacy.  - You may also stop by our office during regular business hours and pick up a GoodRx coupon card.  - If you need your prescription sent electronically to a different pharmacy, notify our office through [facility] or by phone at [phone] option 4.     Si Usted Necesita Algo Despus de Su Visita  Tambin puede enviarnos un mensaje a travs de Pharmacist, community. Por lo general respondemos a los mensajes de MyChart en el transcurso de 1 a 2 das hbiles.  Para renovar recetas, por favor pida a su farmacia que se ponga en contacto con nuestra oficina. Harland Dingwall de fax es [place] [phone].  Si tiene un asunto urgente cuando la clnica est cerrada y que no puede esperar hasta el siguiente da hbil, puede  llamar/localizar a su doctor(a) al nmero que aparece a continuacin.   Por favor, tenga en cuenta que aunque hacemos todo lo posible para estar disponibles para asuntos urgentes fuera del horario de [place], no estamos disponibles las 24 horas del da, los 7 das de la [place].   Si tiene un problema urgente y no puede comunicarse con nosotros, puede optar por buscar atencin mdica  en el consultorio de su doctor(a), en una clnica privada, en un centro de atencin urgente o en una sala de emergencias.  Si tiene Engineering geologist, por favor llame inmediatamente al 911 o vaya a la sala de emergencias.  Nmeros de bper  - Dr. Nehemiah Massed: [phone]  - Dra. Moye: [phone]  - Dra. Nicole Kindred: [phone]  En caso de inclemencias del [place], por favor llame a Johnsie Kindred principal al [phone] para una actualizacin sobre el [place] de cualquier retraso o cierre.  Consejos para la medicacin en dermatologa:  Por favor, guarde las cajas en las que vienen los medicamentos de uso tpico para ayudarle a seguir las instrucciones sobre dnde y cmo usarlos. Las farmacias generalmente imprimen las instrucciones del medicamento slo en las cajas y no directamente en los tubos del [place].   Si su medicamento es muy caro, por favor, pngase en contacto con Zigmund Daniel llamando al [phone] y presione la opcin 4 o envenos un mensaje a travs de Pharmacist, community.   No podemos decirle cul ser su copago por los medicamentos por adelantado ya que esto es diferente dependiendo de la cobertura de su seguro. Sin embargo, es posible que podamos encontrar un medicamento sustituto a Electrical engineer un formulario para que el seguro cubra el medicamento que se considera necesario.   Si se requiere una autorizacin previa para que su compaa de seguros [place] su medicamento, por favor permtanos de 1 a 2 das hbiles para completar este proceso.  Los precios de los medicamentos varan con  frecuencia dependiendo del Environmental consultant de dnde se surte la receta y alguna farmacias pueden ofrecer precios ms baratos.  El sitio web www.goodrx.com tiene cupones para medicamentos de Airline pilot. Los precios aqu no tienen en cuenta lo que podra costar con la ayuda del seguro (puede ser ms barato con su seguro), pero el sitio web puede darle el precio si no utiliz Research scientist (physical sciences).  - Puede imprimir el cupn correspondiente y llevarlo con su receta a la farmacia.  - Tambin puede pasar por nuestra oficina durante el horario de atencin regular y Charity fundraiser una tarjeta de cupones de GoodRx.  - Si necesita que su receta se enve electrnicamente a una farmacia diferente, informe a nuestra oficina a travs de MyChart de [place] o por telfono llamando al [phone] y presione la opcin 4.

## 2022-12-04 NOTE — Progress Notes (Signed)
Follow-Up Visit   Subjective  Omar Turner is a 52 y.o. male who presents for the following: Skin Problem (Here for biopsies and EDC on chest and left arm).  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  Also shave removal of mod/severe nevus.  Has a couple of spots on his neck to check  The patient has spots, moles and lesions to be evaluated, some may be new or changing and the patient has concerns that these could be cancer.   The following portions of the chart were reviewed this encounter and updated as appropriate:      Review of Systems: No other skin or systemic complaints except as noted in HPI or Assessment and Plan.   Objective  Well appearing patient in no apparent distress; mood and affect are within normal limits.  A focused examination was performed including face, chest, arms. Relevant physical exam findings are noted in the Assessment and Plan.  left upper chest medial 9 x 6 pink pearly papule     left upper chest lateral 10 x 7 cm pearly patch  left upper chest inferior 10 mm pink scaly patch   Left Upper Arm - Anterior 1.2 cm pink scaly patch      Assessment & Plan  Neoplasm of skin (4) left upper chest medial  Epidermal / dermal shaving  Lesion diameter (cm):  0.9 Informed consent: discussed and consent obtained   Patient was prepped and draped in usual sterile fashion: Area prepped with alcohol. Anesthesia: the lesion was anesthetized in a standard fashion   Anesthetic:  1% lidocaine w/ epinephrine 1-100,000 buffered w/ 8.4% NaHCO3 Instrument used: flexible razor blade   Hemostasis achieved with: pressure, aluminum chloride and electrodesiccation   Outcome: patient tolerated procedure well    Destruction of lesion  Destruction method: electrodesiccation and curettage   Informed consent: discussed and consent obtained   Curettage performed in three different directions: Yes   Electrodesiccation performed over the curetted  area: Yes   Final wound size (cm):  1 Hemostasis achieved with:  pressure, aluminum chloride and electrodesiccation Outcome: patient tolerated procedure well with no complications   Post-procedure details: wound care instructions given   Additional details:  Mupirocin ointment and Bandaid applied   Specimen 1 - Surgical pathology Differential Diagnosis: R/O BCC  Check Margins: No  left upper chest lateral  Epidermal / dermal shaving  Lesion diameter (cm):  1 Informed consent: discussed and consent obtained   Patient was prepped and draped in usual sterile fashion: Area prepped with alcohol. Anesthesia: the lesion was anesthetized in a standard fashion   Anesthetic:  1% lidocaine w/ epinephrine 1-100,000 buffered w/ 8.4% NaHCO3 Instrument used: flexible razor blade   Hemostasis achieved with: pressure, aluminum chloride and electrodesiccation   Outcome: patient tolerated procedure well    Destruction of lesion  Destruction method: electrodesiccation and curettage   Informed consent: discussed and consent obtained   Curettage performed in three different directions: Yes   Electrodesiccation performed over the curetted area: Yes   Final wound size (cm):  1.1 Hemostasis achieved with:  pressure, aluminum chloride and electrodesiccation Outcome: patient tolerated procedure well with no complications   Post-procedure details: wound care instructions given   Additional details:  Mupirocin ointment and Bandaid applied   Specimen 2 - Surgical pathology Differential Diagnosis:  R/O BCC  Check Margins: No  left upper chest inferior  Epidermal / dermal shaving  Lesion diameter (cm):  1 Informed consent: discussed  and consent obtained   Patient was prepped and draped in usual sterile fashion: Area prepped with alcohol. Anesthesia: the lesion was anesthetized in a standard fashion   Anesthetic:  1% lidocaine w/ epinephrine 1-100,000 buffered w/ 8.4% NaHCO3 Instrument used:  flexible razor blade   Hemostasis achieved with: pressure, aluminum chloride and electrodesiccation   Outcome: patient tolerated procedure well    Destruction of lesion  Destruction method: electrodesiccation and curettage   Informed consent: discussed and consent obtained   Curettage performed in three different directions: Yes   Electrodesiccation performed over the curetted area: Yes   Final wound size (cm):  1.1 Hemostasis achieved with:  pressure, aluminum chloride and electrodesiccation Outcome: patient tolerated procedure well with no complications   Post-procedure details: wound care instructions given   Additional details:  Mupirocin ointment and Bandaid applied   Specimen 3 - Surgical pathology Differential Diagnosis:  R/O BCC  Check Margins: No  Left Upper Arm - Anterior  Epidermal / dermal shaving  Lesion diameter (cm):  1.2 Informed consent: discussed and consent obtained   Patient was prepped and draped in usual sterile fashion: Area prepped with alcohol. Anesthesia: the lesion was anesthetized in a standard fashion   Anesthetic:  1% lidocaine w/ epinephrine 1-100,000 buffered w/ 8.4% NaHCO3 Instrument used: flexible razor blade   Hemostasis achieved with: pressure, aluminum chloride and electrodesiccation   Outcome: patient tolerated procedure well    Destruction of lesion  Destruction method: electrodesiccation and curettage   Informed consent: discussed and consent obtained   Curettage performed in three different directions: Yes   Electrodesiccation performed over the curetted area: Yes   Final wound size (cm):  1.5 Hemostasis achieved with:  pressure, aluminum chloride and electrodesiccation Outcome: patient tolerated procedure well with no complications   Post-procedure details: wound care instructions given   Additional details:  Mupirocin ointment and Bandaid applied   Specimen 4 - Surgical pathology Differential Diagnosis:  R/O BCC  Check  Margins: No  Plan biopsy of lesions x 2 on neck at next visit, suspicious for BCCs  Actinic skin damage  Actinic Damage - chronic, secondary to cumulative UV radiation exposure/sun exposure over time - diffuse scaly erythematous macules with underlying dyspigmentation - Recommend daily broad spectrum sunscreen SPF 30+ to sun-exposed areas, reapply every 2 hours as needed.  - Recommend staying in the shade or wearing long sleeves, sun glasses (UVA+UVB protection) and wide brim hats (4-inch brim around the entire circumference of the hat). - Call for new or changing lesions.   Return for [facility] as scheduled, biopsies on neck in 2 weeks if available.  I, Emelia Salisbury, CMA, am acting as scribe for Brendolyn Patty, MD.  Documentation: I have reviewed the above documentation for accuracy and completeness, and I agree with the above.  Brendolyn Patty MD

## 2022-12-09 ENCOUNTER — Telehealth: Payer: Self-pay

## 2022-12-09 NOTE — Telephone Encounter (Signed)
Biopsy results discussed with patient

## 2022-12-09 NOTE — Telephone Encounter (Signed)
-----   Message from Brendolyn Patty, MD sent at 12/09/2022  9:47 AM EST ----- 1. Skin , left upper chest medial BASAL CELL CARCINOMA, NODULAR PATTERN 2. Skin , left upper chest lateral BASAL CELL CARCINOMA, NODULAR PATTERN 3. Skin , left upper chest inferior BASAL CELL CARCINOMA, SUPERFICIAL AND NODULAR PATTERNS 4. Skin , left upper arm anterior BASAL CELL CARCINOMA, SUPERFICIAL AND NODULAR PATTERNS  1-4 BCC skin cancers- already treated with EDC at time of biopsy    - please call patient

## 2022-12-09 NOTE — Telephone Encounter (Signed)
Lft pt msg to call for bx results/sh °

## 2022-12-17 ENCOUNTER — Ambulatory Visit: Payer: BC Managed Care – PPO | Admitting: Dermatology

## 2022-12-30 ENCOUNTER — Ambulatory Visit: Payer: BC Managed Care – PPO | Admitting: Dermatology

## 2022-12-30 ENCOUNTER — Encounter: Payer: Self-pay | Admitting: Dermatology

## 2022-12-30 VITALS — BP 126/85 | HR 80

## 2022-12-30 DIAGNOSIS — D235 Other benign neoplasm of skin of trunk: Secondary | ICD-10-CM

## 2022-12-30 DIAGNOSIS — L578 Other skin changes due to chronic exposure to nonionizing radiation: Secondary | ICD-10-CM

## 2022-12-30 DIAGNOSIS — Z86007 Personal history of in-situ neoplasm of skin: Secondary | ICD-10-CM

## 2022-12-30 DIAGNOSIS — C4441 Basal cell carcinoma of skin of scalp and neck: Secondary | ICD-10-CM

## 2022-12-30 DIAGNOSIS — L820 Inflamed seborrheic keratosis: Secondary | ICD-10-CM

## 2022-12-30 DIAGNOSIS — D045 Carcinoma in situ of skin of trunk: Secondary | ICD-10-CM

## 2022-12-30 DIAGNOSIS — C44519 Basal cell carcinoma of skin of other part of trunk: Secondary | ICD-10-CM

## 2022-12-30 DIAGNOSIS — D099 Carcinoma in situ, unspecified: Secondary | ICD-10-CM

## 2022-12-30 DIAGNOSIS — L821 Other seborrheic keratosis: Secondary | ICD-10-CM

## 2022-12-30 DIAGNOSIS — D239 Other benign neoplasm of skin, unspecified: Secondary | ICD-10-CM

## 2022-12-30 DIAGNOSIS — D229 Melanocytic nevi, unspecified: Secondary | ICD-10-CM

## 2022-12-30 DIAGNOSIS — L814 Other melanin hyperpigmentation: Secondary | ICD-10-CM | POA: Diagnosis not present

## 2022-12-30 DIAGNOSIS — Z85828 Personal history of other malignant neoplasm of skin: Secondary | ICD-10-CM

## 2022-12-30 DIAGNOSIS — D485 Neoplasm of uncertain behavior of skin: Secondary | ICD-10-CM

## 2022-12-30 NOTE — Patient Instructions (Addendum)
Wound Care Instructions  Cleanse wound gently with soap and water once a day then pat dry with clean gauze. Apply a thin coat of Petrolatum (petroleum jelly, "Vaseline") over the wound (unless you have an allergy to this). We recommend that you use a new, sterile tube of Vaseline. Do not pick or remove scabs. Do not remove the yellow or white "healing tissue" from the base of the wound.  Cover the wound with fresh, clean, nonstick gauze and secure with paper tape. You may use Band-Aids in place of gauze and tape if the wound is small enough, but would recommend trimming much of the tape off as there is often too much. Sometimes Band-Aids can irritate the skin.  You should call the office for your biopsy report after 1 week if you have not already been contacted.  If you experience any problems, such as abnormal amounts of bleeding, swelling, significant bruising, significant pain, or evidence of infection, please call the office immediately.  FOR ADULT SURGERY PATIENTS: If you need something for pain relief you may take 1 extra strength Tylenol (acetaminophen) AND 2 Ibuprofen (200mg each) together every 4 hours as needed for pain. (do not take these if you are allergic to them or if you have a reason you should not take them.) Typically, you may only need pain medication for 1 to 3 days.      Cryotherapy Aftercare  Wash gently with soap and water everyday.   Apply Vaseline and Band-Aid daily until healed.      Due to recent changes in healthcare laws, you may see results of your pathology and/or laboratory studies on MyChart before the doctors have had a chance to review them. We understand that in some cases there may be results that are confusing or concerning to you. Please understand that not all results are received at the same time and often the doctors may need to interpret multiple results in order to provide you with the best plan of care or course of treatment. Therefore, we ask that  you please give us 2 business days to thoroughly review all your results before contacting the office for clarification. Should we see a critical lab result, you will be contacted sooner.   If You Need Anything After Your Visit  If you have any questions or concerns for your doctor, please call our main line at [phone] and press option 4 to reach your doctor's medical assistant. If no one answers, please leave a voicemail as directed and we will return your call as soon as possible. Messages left after 4 pm will be answered the following business day.   You may also send us a message via MyChart. We typically respond to MyChart messages within 1-2 business days.  For prescription refills, please ask your pharmacy to contact our office. Our fax number is [phone].  If you have an urgent issue when the clinic is closed that cannot wait until the next business day, you can page your doctor at the number below.    Please note that while we do our best to be available for urgent issues outside of office hours, we are not available 24/7.   If you have an urgent issue and are unable to reach us, you may choose to seek medical care at your doctor's office, retail clinic, urgent care center, or emergency room.  If you have a medical emergency, please immediately call 911 or go to the emergency department.  Pager Numbers  - Dr.   Kowalski: [phone]  - Dr. Moye: [phone]  - Dr. Stewart: [phone]  In the event of inclement weather, please call our main line at [phone] for an update on the status of any delays or closures.  Dermatology Medication Tips: Please keep the boxes that topical medications come in in order to help keep track of the instructions about where and how to use these. Pharmacies typically print the medication instructions only on the boxes and not directly on the medication tubes.   If your medication is too expensive, please contact our office at  [phone] option 4 or send us a message through MyChart.   We are unable to tell what your co-pay for medications will be in advance as this is different depending on your insurance coverage. However, we may be able to find a substitute medication at lower cost or fill out paperwork to get insurance to cover a needed medication.   If a prior authorization is required to get your medication covered by your insurance company, please allow us 1-2 business days to complete this process.  Drug prices often vary depending on where the prescription is filled and some pharmacies may offer cheaper prices.  The website www.goodrx.com contains coupons for medications through different pharmacies. The prices here do not account for what the cost may be with help from insurance (it may be cheaper with your insurance), but the website can give you the price if you did not use any insurance.  - You can print the associated coupon and take it with your prescription to the pharmacy.  - You may also stop by our office during regular business hours and pick up a GoodRx coupon card.  - If you need your prescription sent electronically to a different pharmacy, notify our office through  MyChart or by phone at [phone] option 4.     Si Usted Necesita Algo Despus de Su Visita  Tambin puede enviarnos un mensaje a travs de MyChart. Por lo general respondemos a los mensajes de MyChart en el transcurso de 1 a 2 das hbiles.  Para renovar recetas, por favor pida a su farmacia que se ponga en contacto con nuestra oficina. Nuestro nmero de fax es el [phone].  Si tiene un asunto urgente cuando la clnica est cerrada y que no puede esperar hasta el siguiente da hbil, puede llamar/localizar a su doctor(a) al nmero que aparece a continuacin.   Por favor, tenga en cuenta que aunque hacemos todo lo posible para estar disponibles para asuntos urgentes fuera del horario de oficina, no estamos  disponibles las 24 horas del da, los 7 das de la semana.   Si tiene un problema urgente y no puede comunicarse con nosotros, puede optar por buscar atencin mdica  en el consultorio de su doctor(a), en una clnica privada, en un centro de atencin urgente o en una sala de emergencias.  Si tiene una emergencia mdica, por favor llame inmediatamente al 911 o vaya a la sala de emergencias.  Nmeros de bper  - Dr. Kowalski: [phone]  - Dra. Moye: [phone]  - Dra. Stewart: [phone]  En caso de inclemencias del tiempo, por favor llame a nuestra lnea principal al [phone] para una actualizacin sobre el estado de cualquier retraso o cierre.  Consejos para la medicacin en dermatologa: Por favor, guarde las cajas en las que vienen los medicamentos de uso tpico para ayudarle a seguir las instrucciones sobre dnde y cmo usarlos. Las farmacias generalmente imprimen las instrucciones del medicamento slo   en las cajas y no directamente en los tubos del medicamento.   Si su medicamento es muy caro, por favor, pngase en contacto con nuestra oficina llamando al [phone] y presione la opcin 4 o envenos un mensaje a travs de MyChart.   No podemos decirle cul ser su copago por los medicamentos por adelantado ya que esto es diferente dependiendo de la cobertura de su seguro. Sin embargo, es posible que podamos encontrar un medicamento sustituto a menor costo o llenar un formulario para que el seguro cubra el medicamento que se considera necesario.   Si se requiere una autorizacin previa para que su compaa de seguros cubra su medicamento, por favor permtanos de 1 a 2 das hbiles para completar este proceso.  Los precios de los medicamentos varan con frecuencia dependiendo del lugar de dnde se surte la receta y alguna farmacias pueden ofrecer precios ms baratos.  El sitio web www.goodrx.com tiene cupones para medicamentos de diferentes farmacias. Los precios aqu no  tienen en cuenta lo que podra costar con la ayuda del seguro (puede ser ms barato con su seguro), pero el sitio web puede darle el precio si no utiliz ningn seguro.  - Puede imprimir el cupn correspondiente y llevarlo con su receta a la farmacia.  - Tambin puede pasar por nuestra oficina durante el horario de atencin regular y recoger una tarjeta de cupones de GoodRx.  - Si necesita que su receta se enve electrnicamente a una farmacia diferente, informe a nuestra oficina a travs de MyChart de [place] o por telfono llamando al [phone] y presione la opcin 4.  

## 2022-12-30 NOTE — Progress Notes (Unsigned)
Follow-Up Visit   Subjective  Omar Turner is a 52 y.o. male who presents for the following: Follow-up.  Patient presents for follow-up biopsy. SCCIS, BCC x 2, Mod/severe dysplastic nevus. He also has several new spots that are irritated and sore.  The following portions of the chart were reviewed this encounter and updated as appropriate:       Review of Systems:  No other skin or systemic complaints except as noted in HPI or Assessment and Plan.  Objective  Well appearing patient in no apparent distress; mood and affect are within normal limits.  A focused examination was performed including face, trunk, extremities. Relevant physical exam findings are noted in the Assessment and Plan.  Left Upper Mid Back Pink biopsy scar.  clear  left spinal upper back Pink biopsy scar. clear  left posterior neck Pink biopsy scar. clear  Right Spinal Mid Back Pink biopsy scar.  left neck x 2, right nose x 1, L crown x 1, L chest x 1 (5) Erythematous stuck-on, waxy papule  Right posterior neck 5 mm pearly papule       Assessment & Plan  Actinic Damage - chronic, secondary to cumulative UV radiation exposure/sun exposure over time - diffuse scaly erythematous macules with underlying dyspigmentation - Recommend daily broad spectrum sunscreen SPF 30+ to sun-exposed areas, reapply every 2 hours as needed.  - Recommend staying in the shade or wearing long sleeves, sun glasses (UVA+UVB protection) and wide brim hats (4-inch brim around the entire circumference of the hat). - Call for new or changing lesions.  Melanocytic Nevi - Tan-brown and/or pink-flesh-colored symmetric macules and papules - Benign appearing on exam today - Observation - Call clinic for new or changing moles - Recommend daily use of broad spectrum spf 30+ sunscreen to sun-exposed areas.   Seborrheic Keratoses - Stuck-on, waxy, tan-brown papules and/or plaques  - Benign-appearing - Discussed benign  etiology and prognosis. - Observe - Call for any changes  Lentigines - Scattered tan macules - Due to sun exposure - Benign-appearing, observe - Recommend daily broad spectrum sunscreen SPF 30+ to sun-exposed areas, reapply every 2 hours as needed. - Call for any changes  History of Basal Cell Carcinoma of the Skin - No evidence of recurrence today - Recommend regular full body skin exams - Recommend daily broad spectrum sunscreen SPF 30+ to sun-exposed areas, reapply every 2 hours as needed.  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 Appears clear with biopsy. Observation.   Basal cell carcinoma (BCC) of skin of other part of torso (2) left spinal upper back  left posterior neck  Biopsy proven. Appears clear with biopsy. Observation.   Dysplastic nevus Right Spinal Mid Back  Bx-proven mod/severe dysplastic nevus  Epidermal / dermal shaving - Right Spinal Mid Back  Lesion diameter (cm):  0.7 Informed consent: discussed and consent obtained   Patient was prepped and draped in usual sterile fashion: Area prepped with alcohol. Anesthesia: the lesion was anesthetized in a standard fashion   Anesthetic:  1% lidocaine w/ epinephrine 1-100,000 buffered w/ 8.4% NaHCO3 Instrument used: flexible razor blade   Hemostasis achieved with: pressure, aluminum chloride and electrodesiccation   Outcome: patient tolerated procedure well   Post-procedure details: wound care instructions given   Post-procedure details comment:  Ointment and small bandage applied  Specimen 1 - Surgical pathology Differential Diagnosis: Dysplastic Nevus  with moderate to severe atypia, biopsy proven.  Check Margins: Yes [phone]  Inflamed seborrheic keratosis (5) left neck x 2, right nose x 1, L crown x 1, L chest x 1  Symptomatic, irritating, patient would like treated.  Destruction of lesion - left  neck x 2, right nose x 1, L crown x 1, L chest x 1  Destruction method: cryotherapy   Informed consent: discussed and consent obtained   Lesion destroyed using liquid nitrogen: Yes   Region frozen until ice ball extended beyond lesion: Yes   Outcome: patient tolerated procedure well with no complications   Post-procedure details: wound care instructions given   Additional details:  Prior to procedure, discussed risks of blister formation, small wound, skin dyspigmentation, or rare scar following cryotherapy. Recommend Vaseline ointment to treated areas while healing.   Neoplasm of uncertain behavior of skin Right posterior neck  Skin / nail biopsy Type of biopsy: tangential   Informed consent: discussed and consent obtained   Patient was prepped and draped in usual sterile fashion: Area prepped with alcohol. Anesthesia: the lesion was anesthetized in a standard fashion   Anesthetic:  1% lidocaine w/ epinephrine 1-100,000 buffered w/ 8.4% NaHCO3 Instrument used: flexible razor blade   Hemostasis achieved with: pressure, aluminum chloride and electrodesiccation   Outcome: patient tolerated procedure well    Destruction of lesion  Destruction method: electrodesiccation and curettage   Informed consent: discussed and consent obtained   Curettage performed in three different directions: Yes   Electrodesiccation performed over the curetted area: Yes   Final wound size (cm):  0.9 Hemostasis achieved with:  pressure, aluminum chloride and electrodesiccation Outcome: patient tolerated procedure well with no complications   Post-procedure details: wound care instructions given   Post-procedure details comment:  Ointment and bandage applied.  Specimen 2 - Surgical pathology Differential Diagnosis: BCC vs other Check Margins: No EDC today   Return in about 3 months (around 04/01/2023) for Hx BCC.  Documentation: I have reviewed the above documentation for accuracy and completeness, and  I agree with the above.  Brendolyn Patty MD

## 2022-12-31 ENCOUNTER — Telehealth: Payer: Self-pay

## 2022-12-31 NOTE — Telephone Encounter (Signed)
-----   Message from Brendolyn Patty, MD sent at 12/31/2022  5:00 PM EDT ----- 1. Skin , right spinal mid back NO RESIDUAL DYSPLASTIC NEVUS, MARGINS FREE 2. Skin , right posterior neck BASAL CELL CARCINOMA, NODULAR PATTERN  1. H/o dysplastic nevus- margins free 2. BCC skin cancer- already treated with EDC at time of biopsy    - please call patient

## 2022-12-31 NOTE — Telephone Encounter (Signed)
Advised pt of bx results/sh ?

## 2023-03-20 ENCOUNTER — Ambulatory Visit
Admission: RE | Admit: 2023-03-20 | Payer: BC Managed Care – PPO | Source: Home / Self Care | Admitting: Gastroenterology

## 2023-03-20 ENCOUNTER — Encounter: Admission: RE | Payer: Self-pay | Source: Home / Self Care

## 2023-03-20 SURGERY — ESOPHAGOGASTRODUODENOSCOPY (EGD) WITH PROPOFOL
Anesthesia: General

## 2023-04-01 ENCOUNTER — Ambulatory Visit: Payer: BC Managed Care – PPO | Admitting: Dermatology

## 2023-04-01 ENCOUNTER — Encounter: Payer: Self-pay | Admitting: Dermatology

## 2023-04-01 VITALS — BP 125/75 | HR 93

## 2023-04-01 DIAGNOSIS — W908XXA Exposure to other nonionizing radiation, initial encounter: Secondary | ICD-10-CM

## 2023-04-01 DIAGNOSIS — L570 Actinic keratosis: Secondary | ICD-10-CM | POA: Diagnosis not present

## 2023-04-01 DIAGNOSIS — C44519 Basal cell carcinoma of skin of other part of trunk: Secondary | ICD-10-CM

## 2023-04-01 DIAGNOSIS — L821 Other seborrheic keratosis: Secondary | ICD-10-CM

## 2023-04-01 DIAGNOSIS — D225 Melanocytic nevi of trunk: Secondary | ICD-10-CM | POA: Diagnosis not present

## 2023-04-01 DIAGNOSIS — D492 Neoplasm of unspecified behavior of bone, soft tissue, and skin: Secondary | ICD-10-CM

## 2023-04-01 DIAGNOSIS — L578 Other skin changes due to chronic exposure to nonionizing radiation: Secondary | ICD-10-CM

## 2023-04-01 DIAGNOSIS — Z85828 Personal history of other malignant neoplasm of skin: Secondary | ICD-10-CM

## 2023-04-01 DIAGNOSIS — D229 Melanocytic nevi, unspecified: Secondary | ICD-10-CM

## 2023-04-01 DIAGNOSIS — L820 Inflamed seborrheic keratosis: Secondary | ICD-10-CM | POA: Diagnosis not present

## 2023-04-01 NOTE — Patient Instructions (Addendum)
Wound Care Instructions  Cleanse wound gently with soap and water once a day then pat dry with clean gauze. Apply a thin coat of Petrolatum (petroleum jelly, "Vaseline") over the wound (unless you have an allergy to this). We recommend that you use a new, sterile tube of Vaseline. Do not pick or remove scabs. Do not remove the yellow or white "healing tissue" from the base of the wound.  Cover the wound with fresh, clean, nonstick gauze and secure with paper tape. You may use Band-Aids in place of gauze and tape if the wound is small enough, but would recommend trimming much of the tape off as there is often too much. Sometimes Band-Aids can irritate the skin.  You should call the office for your biopsy report after 1 week if you have not already been contacted.  If you experience any problems, such as abnormal amounts of bleeding, swelling, significant bruising, significant pain, or evidence of infection, please call the office immediately.  FOR ADULT SURGERY PATIENTS: If you need something for pain relief you may take 1 extra strength Tylenol (acetaminophen) AND 2 Ibuprofen (200mg each) together every 4 hours as needed for pain. (do not take these if you are allergic to them or if you have a reason you should not take them.) Typically, you may only need pain medication for 1 to 3 days.     Cryotherapy Aftercare  Wash gently with soap and water everyday.   Apply Vaseline and Band-Aid daily until healed.   Recommend daily broad spectrum sunscreen SPF 30+ to sun-exposed areas, reapply every 2 hours as needed. Call for new or changing lesions.  Staying in the shade or wearing long sleeves, sun glasses (UVA+UVB protection) and wide brim hats (4-inch brim around the entire circumference of the hat) are also recommended for sun protection.     Due to recent changes in healthcare laws, you may see results of your pathology and/or laboratory studies on MyChart before the doctors have had a chance to  review them. We understand that in some cases there may be results that are confusing or concerning to you. Please understand that not all results are received at the same time and often the doctors may need to interpret multiple results in order to provide you with the best plan of care or course of treatment. Therefore, we ask that you please give us 2 business days to thoroughly review all your results before contacting the office for clarification. Should we see a critical lab result, you will be contacted sooner.   If You Need Anything After Your Visit  If you have any questions or concerns for your doctor, please call our main line at [phone] and press option 4 to reach your doctor's medical assistant. If no one answers, please leave a voicemail as directed and we will return your call as soon as possible. Messages left after 4 pm will be answered the following business day.   You may also send us a message via MyChart. We typically respond to MyChart messages within 1-2 business days.  For prescription refills, please ask your pharmacy to contact our office. Our fax number is [phone].  If you have an urgent issue when the clinic is closed that cannot wait until the next business day, you can page your doctor at the number below.    Please note that while we do our best to be available for urgent issues outside of office hours, we are not available 24/7.     If you have an urgent issue and are unable to reach us, you may choose to seek medical care at your doctor's office, retail clinic, urgent care center, or emergency room.  If you have a medical emergency, please immediately call 911 or go to the emergency department.  Pager Numbers  - Dr. Kowalski: [phone]  - Dr. Moye: [phone]  - Dr. Stewart: [phone]  In the event of inclement weather, please call our main line at [phone] for an update on the status of any delays or closures.  Dermatology Medication  Tips: Please keep the boxes that topical medications come in in order to help keep track of the instructions about where and how to use these. Pharmacies typically print the medication instructions only on the boxes and not directly on the medication tubes.   If your medication is too expensive, please contact our office at [phone] option 4 or send us a message through MyChart.   We are unable to tell what your co-pay for medications will be in advance as this is different depending on your insurance coverage. However, we may be able to find a substitute medication at lower cost or fill out paperwork to get insurance to cover a needed medication.   If a prior authorization is required to get your medication covered by your insurance company, please allow us 1-2 business days to complete this process.  Drug prices often vary depending on where the prescription is filled and some pharmacies may offer cheaper prices.  The website www.goodrx.com contains coupons for medications through different pharmacies. The prices here do not account for what the cost may be with help from insurance (it may be cheaper with your insurance), but the website can give you the price if you did not use any insurance.  - You can print the associated coupon and take it with your prescription to the pharmacy.  - You may also stop by our office during regular business hours and pick up a GoodRx coupon card.  - If you need your prescription sent electronically to a different pharmacy, notify our office through [facility] MyChart or by phone at [phone] option 4.     Si Usted Necesita Algo Despus de Su Visita  Tambin puede enviarnos un mensaje a travs de MyChart. Por lo general respondemos a los mensajes de MyChart en el transcurso de 1 a 2 das hbiles.  Para renovar recetas, por favor pida a su farmacia que se ponga en contacto con nuestra oficina. Nuestro nmero de fax es el [phone].  Si tiene un  asunto urgente cuando la clnica est cerrada y que no puede esperar hasta el siguiente da hbil, puede llamar/localizar a su doctor(a) al nmero que aparece a continuacin.   Por favor, tenga en cuenta que aunque hacemos todo lo posible para estar disponibles para asuntos urgentes fuera del horario de oficina, no estamos disponibles las 24 horas del da, los 7 das de la semana.   Si tiene un problema urgente y no puede comunicarse con nosotros, puede optar por buscar atencin mdica  en el consultorio de su doctor(a), en una clnica privada, en un centro de atencin urgente o en una sala de emergencias.  Si tiene una emergencia mdica, por favor llame inmediatamente al 911 o vaya a la sala de emergencias.  Nmeros de bper  - Dr. Kowalski: [phone]  - Dra. Moye: [phone]  - Dra. Stewart: [phone]  En caso de inclemencias del tiempo, por favor llame a nuestra lnea principal   al [phone] para una actualizacin sobre el estado de cualquier retraso o cierre.  Consejos para la medicacin en dermatologa: Por favor, guarde las cajas en las que vienen los medicamentos de uso tpico para ayudarle a seguir las instrucciones sobre dnde y cmo usarlos. Las farmacias generalmente imprimen las instrucciones del medicamento slo en las cajas y no directamente en los tubos del medicamento.   Si su medicamento es muy caro, por favor, pngase en contacto con nuestra oficina llamando al [phone] y presione la opcin 4 o envenos un mensaje a travs de MyChart.   No podemos decirle cul ser su copago por los medicamentos por adelantado ya que esto es diferente dependiendo de la cobertura de su seguro. Sin embargo, es posible que podamos encontrar un medicamento sustituto a menor costo o llenar un formulario para que el seguro cubra el medicamento que se considera necesario.   Si se requiere una autorizacin previa para que su compaa de seguros cubra su medicamento, por favor  permtanos de 1 a 2 das hbiles para completar este proceso.  Los precios de los medicamentos varan con frecuencia dependiendo del lugar de dnde se surte la receta y alguna farmacias pueden ofrecer precios ms baratos.  El sitio web www.goodrx.com tiene cupones para medicamentos de diferentes farmacias. Los precios aqu no tienen en cuenta lo que podra costar con la ayuda del seguro (puede ser ms barato con su seguro), pero el sitio web puede darle el precio si no utiliz ningn seguro.  - Puede imprimir el cupn correspondiente y llevarlo con su receta a la farmacia.  - Tambin puede pasar por nuestra oficina durante el horario de atencin regular y recoger una tarjeta de cupones de GoodRx.  - Si necesita que su receta se enve electrnicamente a una farmacia diferente, informe a nuestra oficina a travs de MyChart de [place] o por telfono llamando al [phone] y presione la opcin 4.  

## 2023-04-01 NOTE — Progress Notes (Signed)
Follow-Up Visit   Subjective  Omar Turner is a 52 y.o. male who presents for the following: Here for exam and possible treatment of more skin cancers.    The following portions of the chart were reviewed this encounter and updated as appropriate: medications, allergies, medical history  Review of Systems:  No other skin or systemic complaints except as noted in HPI or Assessment and Plan.  Objective  Well appearing patient in no apparent distress; mood and affect are within normal limits.  A focused examination was performed of the following areas: Waist up exam  Relevant exam findings are noted in the Assessment and Plan.  left mid chest 0.8 cm pink pearly macule       Scalp x7 (7) Erythematous thin papules/macules with gritty scale.   Left Abdomen (side) - Lower x1, left lower back x1 (2) Erythematous keratotic or waxy stuck-on papule    Assessment & Plan   SEBORRHEIC KERATOSIS - Stuck-on, waxy, tan-brown papules and/or plaques  - Benign-appearing - Discussed benign etiology and prognosis. - Observe - Call for any changes  MELANOCYTIC NEVUS Exam: 5 mm medium-dark brown macule at left abdomen  Treatment Plan: Benign appearing on exam today. Recommend observation. Call clinic for new or changing moles. Recommend daily use of broad spectrum spf 30+ sunscreen to sun-exposed areas.   ACTINIC DAMAGE - chronic, secondary to cumulative UV radiation exposure/sun exposure over time - diffuse scaly erythematous macules with underlying dyspigmentation - Recommend daily broad spectrum sunscreen SPF 30+ to sun-exposed areas, reapply every 2 hours as needed.  - Recommend staying in the shade or wearing long sleeves, sun glasses (UVA+UVB protection) and wide brim hats (4-inch brim around the entire circumference of the hat). - Call for new or changing lesions.   HISTORY OF BASAL CELL CARCINOMA OF THE SKIN- multiple, see history - No evidence of recurrence today -  Recommend regular full body skin exams - Recommend daily broad spectrum sunscreen SPF 30+ to sun-exposed areas, reapply every 2 hours as needed.  - Call if any new or changing lesions are noted between office visits   Neoplasm of skin left mid chest  Epidermal / dermal shaving  Lesion diameter (cm):  0.8 Informed consent: discussed and consent obtained   Patient was prepped and draped in usual sterile fashion: Area prepped with alcohol. Anesthesia: the lesion was anesthetized in a standard fashion   Anesthetic:  1% lidocaine w/ epinephrine 1-100,000 buffered w/ 8.4% NaHCO3 Instrument used: flexible razor blade   Hemostasis achieved with: pressure, aluminum chloride and electrodesiccation   Outcome: patient tolerated procedure well    Destruction of lesion  Destruction method: electrodesiccation and curettage   Curettage performed in three different directions: Yes   Electrodesiccation performed over the curetted area: Yes   Final wound size (cm):  1 Hemostasis achieved with:  pressure, aluminum chloride and electrodesiccation Outcome: patient tolerated procedure well with no complications   Post-procedure details: wound care instructions given   Additional details:  Mupirocin ointment and Bandaid applied   Specimen 1 - Surgical pathology Differential Diagnosis: AK vs Superficial BCC  Check Margins: No  AK (actinic keratosis) (7) Scalp x7  Actinic keratoses are precancerous spots that appear secondary to cumulative UV radiation exposure/sun exposure over time. They are chronic with expected duration over 1 year. A portion of actinic keratoses will progress to squamous cell carcinoma of the skin. It is not possible to reliably predict which spots will progress to skin cancer and so treatment  is recommended to prevent development of skin cancer.  Recommend daily broad spectrum sunscreen SPF 30+ to sun-exposed areas, reapply every 2 hours as needed.  Recommend staying in the shade  or wearing long sleeves, sun glasses (UVA+UVB protection) and wide brim hats (4-inch brim around the entire circumference of the hat). Call for new or changing lesions.  Destruction of lesion - Scalp x7  Destruction method: cryotherapy   Informed consent: discussed and consent obtained   Lesion destroyed using liquid nitrogen: Yes   Region frozen until ice ball extended beyond lesion: Yes   Outcome: patient tolerated procedure well with no complications   Post-procedure details: wound care instructions given   Additional details:  Prior to procedure, discussed risks of blister formation, small wound, skin dyspigmentation, or rare scar following cryotherapy. Recommend Vaseline ointment to treated areas while healing.   Inflamed seborrheic keratosis (2) Left Abdomen (side) - Lower x1, left lower back x1  Symptomatic, irritating, patient would like treated.  Destruction of lesion - Left Abdomen (side) - Lower x1, left lower back x1  Destruction method: cryotherapy   Informed consent: discussed and consent obtained   Lesion destroyed using liquid nitrogen: Yes   Region frozen until ice ball extended beyond lesion: Yes   Outcome: patient tolerated procedure well with no complications   Post-procedure details: wound care instructions given   Additional details:  Prior to procedure, discussed risks of blister formation, small wound, skin dyspigmentation, or rare scar following cryotherapy. Recommend Vaseline ointment to treated areas while healing.     Return in about 6 months (around 10/01/2023) for UBSE, Hx BCC's.  I, Lawson Radar, CMA, am acting as scribe for Willeen Niece, MD.   Documentation: I have reviewed the above documentation for accuracy and completeness, and I agree with the above.  Willeen Niece, MD

## 2023-04-07 ENCOUNTER — Ambulatory Visit: Payer: BC Managed Care – PPO | Admitting: Physician Assistant

## 2023-04-07 ENCOUNTER — Telehealth: Payer: Self-pay

## 2023-04-07 ENCOUNTER — Encounter: Payer: Self-pay | Admitting: Physician Assistant

## 2023-04-07 VITALS — BP 124/78 | HR 80 | Temp 98.0°F | Resp 16 | Wt 245.8 lb

## 2023-04-07 DIAGNOSIS — L559 Sunburn, unspecified: Secondary | ICD-10-CM

## 2023-04-07 DIAGNOSIS — T675XXA Heat exhaustion, unspecified, initial encounter: Secondary | ICD-10-CM

## 2023-04-07 NOTE — Telephone Encounter (Signed)
Advised pt of bx result/sh ?

## 2023-04-07 NOTE — Patient Instructions (Signed)
Vital signs reviewed and found normal. The exam is consistent with sun poisoning and sun burn of the lower extremities. Please use the zofran every 6 hour for nausea/vomiting. Use Hydrocortisone cream to the sunburn of the knees and lower extremities after a tepid tub soak or shower. Increase water, juices, and gatorade. Return to the clinic or see ED if any changes or problems in you condition.

## 2023-04-07 NOTE — Telephone Encounter (Signed)
-----   Message from Willeen Niece, MD sent at 04/07/2023  2:55 PM EDT ----- Skin , left mid chest BASAL CELL CARCINOMA, SUPERFICIAL AND NODULAR PATTERNS  BCC skin cancer- already treated with EDC at time of biopsy   - please call patient

## 2023-04-07 NOTE — Progress Notes (Signed)
   Subjective:    Patient ID: Omar Turner, male    DOB: [birth year], 52 y.o.   MRN: [id number]  GI Problem The primary symptoms include fatigue, nausea, vomiting, diarrhea and myalgias. Primary symptoms do not include fever, weight loss, abdominal pain, melena, hematemesis, jaundice, hematochezia, dysuria, arthralgias or rash. The illness began 2 days ago. The onset was gradual. The problem has been gradually worsening.  The illness does not include chills, anorexia, dysphagia, bloating, constipation or back pain. Significant associated medical issues include liver disease.      Review of Systems  Constitutional:  Positive for fatigue. Negative for chills, fever and weight loss.  HENT: Negative.    Eyes: Negative.   Respiratory:  Positive for cough.   Cardiovascular: Negative.   Gastrointestinal:  Positive for diarrhea, nausea and vomiting. Negative for abdominal pain, anorexia, bloating, constipation, dysphagia, hematemesis, hematochezia, jaundice and melena.  Genitourinary:  Negative for dysuria.  Musculoskeletal:  Positive for myalgias. Negative for arthralgias and back pain.  Skin:  Negative for rash.       Sun burn both knees and lower legs       Objective:   Physical Exam Constitutional:      Appearance: He is not ill-appearing.  HENT:     Head: Normocephalic and atraumatic.     Right Ear: Tympanic membrane normal.     Left Ear: Tympanic membrane normal.     Mouth/Throat:     Mouth: Mucous membranes are moist.     Pharynx: Oropharynx is clear. No oropharyngeal exudate or posterior oropharyngeal erythema.  Eyes:     General: No scleral icterus.    Extraocular Movements: Extraocular movements intact.     Conjunctiva/sclera: Conjunctivae normal.     Pupils: Pupils are equal, round, and reactive to light.  Cardiovascular:     Rate and Rhythm: Normal rate and regular rhythm.     Pulses: Normal pulses.     Heart sounds: Normal heart sounds.  Pulmonary:     Effort:  Pulmonary effort is normal.  Abdominal:     General: Abdomen is flat. There is no distension.     Palpations: There is no mass.     Tenderness: There is no abdominal tenderness. There is no guarding or rebound.  Musculoskeletal:        General: Normal range of motion.     Cervical back: Normal range of motion.     Comments: Wynelle Link burn noted of both anterior knees. Less burn of the lower extremities. Knees warm, but not hot. No red streaking up the leg. Good ROM of both lower extremities.  Neurological:     General: No focal deficit present.     Mental Status: He is alert and oriented to person, place, and time.           Assessment & Plan: Wynelle Link poisoning Sun burn lower extremities.   Vital signs reviewed and found normal. The exam is consistent with sun poisoning and sun burn of the lower extremities. Please use the zofran every 6 hour for nausea/vomiting. Use Hydrocortisone cream to the sunburn of the knees and lower extremities after a tepid tub soak or shower. Increase water, juices, and gatorade. Return to the clinic or see ED if any changes or problems in you condition.

## 2023-04-16 ENCOUNTER — Ambulatory Visit: Payer: BC Managed Care – PPO | Admitting: Dermatology

## 2023-05-08 ENCOUNTER — Inpatient Hospital Stay
Admission: EM | Admit: 2023-05-08 | Discharge: 2023-05-11 | DRG: 432 | Disposition: A | Discharge status: Home / Self Care | Payer: BC Managed Care – PPO | Attending: Internal Medicine | Admitting: Internal Medicine

## 2023-05-08 ENCOUNTER — Other Ambulatory Visit: Payer: Self-pay

## 2023-05-08 ENCOUNTER — Encounter: Payer: Self-pay | Admitting: Internal Medicine

## 2023-05-08 DIAGNOSIS — R161 Splenomegaly, not elsewhere classified: Secondary | ICD-10-CM | POA: Diagnosis present

## 2023-05-08 DIAGNOSIS — G8929 Other chronic pain: Secondary | ICD-10-CM | POA: Diagnosis present

## 2023-05-08 DIAGNOSIS — K766 Portal hypertension: Secondary | ICD-10-CM | POA: Diagnosis present

## 2023-05-08 DIAGNOSIS — F1010 Alcohol abuse, uncomplicated: Secondary | ICD-10-CM | POA: Diagnosis present

## 2023-05-08 DIAGNOSIS — Y906 Blood alcohol level of 120-199 mg/100 ml: Secondary | ICD-10-CM | POA: Diagnosis present

## 2023-05-08 DIAGNOSIS — K760 Fatty (change of) liver, not elsewhere classified: Secondary | ICD-10-CM | POA: Diagnosis present

## 2023-05-08 DIAGNOSIS — E669 Obesity, unspecified: Secondary | ICD-10-CM | POA: Diagnosis present

## 2023-05-08 DIAGNOSIS — Z8616 Personal history of COVID-19: Secondary | ICD-10-CM

## 2023-05-08 DIAGNOSIS — Z6833 Body mass index (BMI) 33.0-33.9, adult: Secondary | ICD-10-CM

## 2023-05-08 DIAGNOSIS — E7849 Other hyperlipidemia: Secondary | ICD-10-CM | POA: Diagnosis present

## 2023-05-08 DIAGNOSIS — I10 Essential (primary) hypertension: Secondary | ICD-10-CM | POA: Diagnosis present

## 2023-05-08 DIAGNOSIS — K219 Gastro-esophageal reflux disease without esophagitis: Secondary | ICD-10-CM | POA: Diagnosis present

## 2023-05-08 DIAGNOSIS — F10139 Alcohol abuse with withdrawal, unspecified: Secondary | ICD-10-CM | POA: Diagnosis present

## 2023-05-08 DIAGNOSIS — Z720 Tobacco use: Secondary | ICD-10-CM | POA: Diagnosis present

## 2023-05-08 DIAGNOSIS — K7031 Alcoholic cirrhosis of liver with ascites: Principal | ICD-10-CM | POA: Diagnosis present

## 2023-05-08 DIAGNOSIS — Z86008 Personal history of in-situ neoplasm of other site: Secondary | ICD-10-CM

## 2023-05-08 DIAGNOSIS — Z91148 Patient's other noncompliance with medication regimen for other reason: Secondary | ICD-10-CM

## 2023-05-08 DIAGNOSIS — F10229 Alcohol dependence with intoxication, unspecified: Secondary | ICD-10-CM | POA: Diagnosis present

## 2023-05-08 DIAGNOSIS — F17210 Nicotine dependence, cigarettes, uncomplicated: Secondary | ICD-10-CM | POA: Diagnosis present

## 2023-05-08 DIAGNOSIS — R140 Abdominal distension (gaseous): Secondary | ICD-10-CM | POA: Diagnosis present

## 2023-05-08 DIAGNOSIS — F10239 Alcohol dependence with withdrawal, unspecified: Secondary | ICD-10-CM | POA: Diagnosis present

## 2023-05-08 DIAGNOSIS — D696 Thrombocytopenia, unspecified: Secondary | ICD-10-CM | POA: Diagnosis present

## 2023-05-08 DIAGNOSIS — M549 Dorsalgia, unspecified: Secondary | ICD-10-CM | POA: Diagnosis present

## 2023-05-08 DIAGNOSIS — K922 Gastrointestinal hemorrhage, unspecified: Secondary | ICD-10-CM | POA: Diagnosis present

## 2023-05-08 DIAGNOSIS — K625 Hemorrhage of anus and rectum: Secondary | ICD-10-CM | POA: Diagnosis present

## 2023-05-08 DIAGNOSIS — K5720 Diverticulitis of large intestine with perforation and abscess without bleeding: Secondary | ICD-10-CM | POA: Diagnosis present

## 2023-05-08 DIAGNOSIS — T502X6A Underdosing of carbonic-anhydrase inhibitors, benzothiadiazides and other diuretics, initial encounter: Secondary | ICD-10-CM | POA: Diagnosis present

## 2023-05-08 DIAGNOSIS — F411 Generalized anxiety disorder: Secondary | ICD-10-CM | POA: Diagnosis present

## 2023-05-08 DIAGNOSIS — Z8249 Family history of ischemic heart disease and other diseases of the circulatory system: Secondary | ICD-10-CM

## 2023-05-08 DIAGNOSIS — E8809 Other disorders of plasma-protein metabolism, not elsewhere classified: Secondary | ICD-10-CM | POA: Diagnosis present

## 2023-05-08 DIAGNOSIS — Z85828 Personal history of other malignant neoplasm of skin: Secondary | ICD-10-CM

## 2023-05-08 DIAGNOSIS — K7011 Alcoholic hepatitis with ascites: Secondary | ICD-10-CM | POA: Diagnosis present

## 2023-05-08 DIAGNOSIS — K920 Hematemesis: Secondary | ICD-10-CM | POA: Diagnosis present

## 2023-05-08 DIAGNOSIS — Z79899 Other long term (current) drug therapy: Secondary | ICD-10-CM

## 2023-05-08 DIAGNOSIS — I8511 Secondary esophageal varices with bleeding: Secondary | ICD-10-CM | POA: Diagnosis present

## 2023-05-08 DIAGNOSIS — Z86010 Personal history of colonic polyps: Secondary | ICD-10-CM

## 2023-05-08 DIAGNOSIS — K921 Melena: Secondary | ICD-10-CM | POA: Diagnosis present

## 2023-05-08 DIAGNOSIS — K3189 Other diseases of stomach and duodenum: Secondary | ICD-10-CM | POA: Diagnosis present

## 2023-05-08 DIAGNOSIS — R569 Unspecified convulsions: Secondary | ICD-10-CM | POA: Diagnosis present

## 2023-05-08 LAB — COMPREHENSIVE METABOLIC PANEL
ALT: 134 U/L — ABNORMAL HIGH (ref 0–44)
AST: 249 U/L — ABNORMAL HIGH (ref 15–41)
Albumin: 3.3 g/dL — ABNORMAL LOW (ref 3.5–5.0)
Alkaline Phosphatase: 188 U/L — ABNORMAL HIGH (ref 38–126)
Anion gap: 11 (ref 5–15)
BUN: 6 mg/dL (ref 6–20)
CO2: 22 mmol/L (ref 22–32)
Calcium: 8.3 mg/dL — ABNORMAL LOW (ref 8.9–10.3)
Chloride: 103 mmol/L (ref 98–111)
Creatinine, Ser: 0.69 mg/dL (ref 0.61–1.24)
GFR, Estimated: >60 mL/min (ref >60)
Glucose, Bld: 136 mg/dL — ABNORMAL HIGH (ref 70–99)
Potassium: 3.6 mmol/L (ref 3.5–5.1)
Sodium: 136 mmol/L (ref 135–145)
Total Bilirubin: 2.6 mg/dL — ABNORMAL HIGH (ref 0.3–1.2)
Total Protein: 7.5 g/dL (ref 6.5–8.1)

## 2023-05-08 LAB — URINE DRUG SCREEN, QUALITATIVE (ARMC ONLY)
Amphetamines, Ur Screen: NOT DETECTED
Barbiturates, Ur Screen: NOT DETECTED
Benzodiazepine, Ur Scrn: NOT DETECTED
Cannabinoid 50 Ng, Ur ~~LOC~~: NOT DETECTED
Cocaine Metabolite,Ur ~~LOC~~: NOT DETECTED
MDMA (Ecstasy)Ur Screen: NOT DETECTED
Methadone Scn, Ur: NOT DETECTED
Opiate, Ur Screen: NOT DETECTED
Phencyclidine (PCP) Ur S: NOT DETECTED
Tricyclic, Ur Screen: NOT DETECTED

## 2023-05-08 LAB — HEPATITIS PANEL, ACUTE
HCV Ab: NONREACTIVE
Hep A IgM: NONREACTIVE
Hep B C IgM: NONREACTIVE
Hepatitis B Surface Ag: NONREACTIVE

## 2023-05-08 LAB — CBC
HCT: 42.7 % (ref 39.0–52.0)
Hemoglobin: 15.1 g/dL (ref 13.0–17.0)
MCH: 34.2 pg — ABNORMAL HIGH (ref 26.0–34.0)
MCHC: 35.4 g/dL (ref 30.0–36.0)
MCV: 96.8 fL (ref 80.0–100.0)
Platelets: 93 10*3/uL — ABNORMAL LOW (ref 150–400)
RBC: 4.41 MIL/uL (ref 4.22–5.81)
RDW: 15 % (ref 11.5–15.5)
WBC: 8.4 10*3/uL (ref 4.0–10.5)
nRBC: 0 % (ref 0.0–0.2)

## 2023-05-08 LAB — HEMOGLOBIN AND HEMATOCRIT, BLOOD
HCT: 39.8 % (ref 39.0–52.0)
HCT: 40.6 % (ref 39.0–52.0)
Hemoglobin: 14.1 g/dL (ref 13.0–17.0)
Hemoglobin: 14.5 g/dL (ref 13.0–17.0)

## 2023-05-08 LAB — GLUCOSE, CAPILLARY: Glucose-Capillary: 106 mg/dL — ABNORMAL HIGH (ref 70–99)

## 2023-05-08 LAB — PROTIME-INR
INR: 1.2 (ref 0.8–1.2)
Prothrombin Time: 15.7 seconds — ABNORMAL HIGH (ref 11.4–15.2)

## 2023-05-08 LAB — MAGNESIUM: Magnesium: 2 mg/dL (ref 1.7–2.4)

## 2023-05-08 LAB — TYPE AND SCREEN
ABO/RH(D): O NEG
Antibody Screen: NEGATIVE

## 2023-05-08 LAB — AMMONIA: Ammonia: 46 umol/L — ABNORMAL HIGH (ref 9–35)

## 2023-05-08 LAB — ETHANOL: Alcohol, Ethyl (B): 167 mg/dL — ABNORMAL HIGH (ref <10)

## 2023-05-08 LAB — MRSA NEXT GEN BY PCR, NASAL: MRSA by PCR Next Gen: NOT DETECTED

## 2023-05-08 MED ORDER — HEPARIN SODIUM (PORCINE) 5000 UNIT/ML IJ SOLN
5000.0000 [IU] | Freq: Two times a day (BID) | INTRAMUSCULAR | Status: DC
  Administered 2023-05-08: 5000 [IU] via SUBCUTANEOUS
  Filled 2023-05-08: qty 1

## 2023-05-08 MED ORDER — QUETIAPINE FUMARATE 25 MG PO TABS: 25.0000 mg | ORAL_TABLET | Freq: Every day | ORAL | Status: DC

## 2023-05-08 MED ORDER — PANTOPRAZOLE SODIUM 40 MG IV SOLR: 40.0000 mg | Freq: Two times a day (BID) | INTRAVENOUS | Status: DC

## 2023-05-08 MED ORDER — THIAMINE MONONITRATE 100 MG PO TABS
100.0000 mg | ORAL_TABLET | Freq: Every day | ORAL | Status: DC
  Administered 2023-05-09: 100 mg via ORAL
  Filled 2023-05-08: qty 1

## 2023-05-08 MED ORDER — FOLIC ACID 1 MG PO TABS
1.0000 mg | ORAL_TABLET | Freq: Every day | ORAL | Status: DC
  Administered 2023-05-08 – 2023-05-11 (×4): 1 mg via ORAL
  Filled 2023-05-08 (×4): qty 1

## 2023-05-08 MED ORDER — ACETAMINOPHEN 650 MG RE SUPP: 650.0000 mg | Freq: Four times a day (QID) | RECTAL | Status: DC | PRN

## 2023-05-08 MED ORDER — ADULT MULTIVITAMIN W/MINERALS CH
1.0000 | ORAL_TABLET | Freq: Every day | ORAL | Status: DC
  Administered 2023-05-08 – 2023-05-11 (×4): 1 via ORAL
  Filled 2023-05-08 (×4): qty 1

## 2023-05-08 MED ORDER — ACETAMINOPHEN 325 MG PO TABS
650.0000 mg | ORAL_TABLET | Freq: Four times a day (QID) | ORAL | Status: DC | PRN
  Administered 2023-05-08: 650 mg via ORAL
  Filled 2023-05-08: qty 2

## 2023-05-08 MED ORDER — SPIRONOLACTONE 25 MG PO TABS: 100.0000 mg | ORAL_TABLET | Freq: Every day | ORAL | Status: DC

## 2023-05-08 MED ORDER — THIAMINE HCL 100 MG/ML IJ SOLN: 100.0000 mg | Freq: Every day | INTRAMUSCULAR | Status: DC

## 2023-05-08 MED ORDER — LORAZEPAM 2 MG/ML IJ SOLN
1.0000 mg | INTRAMUSCULAR | Status: DC | PRN
  Administered 2023-05-09 (×3): 2 mg via INTRAVENOUS
  Filled 2023-05-08 (×3): qty 1

## 2023-05-08 MED ORDER — ADULT MULTIVITAMIN W/MINERALS CH: 1.0000 | ORAL_TABLET | Freq: Every day | ORAL | Status: DC

## 2023-05-08 MED ORDER — CHLORDIAZEPOXIDE HCL 5 MG PO CAPS
5.0000 mg | ORAL_CAPSULE | Freq: Four times a day (QID) | ORAL | Status: DC
  Administered 2023-05-08: 5 mg via ORAL
  Filled 2023-05-08: qty 1

## 2023-05-08 MED ORDER — SODIUM CHLORIDE 0.9% FLUSH
3.0000 mL | Freq: Two times a day (BID) | INTRAVENOUS | Status: DC
  Administered 2023-05-08 – 2023-05-11 (×4): 3 mL via INTRAVENOUS

## 2023-05-08 MED ORDER — CHLORHEXIDINE GLUCONATE CLOTH 2 % EX PADS
6.0000 | MEDICATED_PAD | Freq: Every day | CUTANEOUS | Status: DC
  Administered 2023-05-10 – 2023-05-11 (×2): 6 via TOPICAL

## 2023-05-08 MED ORDER — LORAZEPAM 1 MG PO TABS
1.0000 mg | ORAL_TABLET | ORAL | Status: DC | PRN
  Filled 2023-05-08: qty 1

## 2023-05-08 MED ORDER — PANTOPRAZOLE 80MG IVPB - SIMPLE MED
80.0000 mg | Freq: Once | INTRAVENOUS | Status: AC
  Administered 2023-05-08: 80 mg via INTRAVENOUS
  Filled 2023-05-08: qty 100

## 2023-05-08 MED ORDER — NICOTINE 14 MG/24HR TD PT24
14.0000 mg | MEDICATED_PATCH | Freq: Every day | TRANSDERMAL | Status: DC
  Administered 2023-05-08 – 2023-05-11 (×4): 14 mg via TRANSDERMAL
  Filled 2023-05-08 (×4): qty 1

## 2023-05-08 MED ORDER — LORAZEPAM 1 MG PO TABS
1.0000 mg | ORAL_TABLET | ORAL | Status: DC | PRN
  Administered 2023-05-08: 1 mg via ORAL
  Administered 2023-05-09: 2 mg via ORAL
  Administered 2023-05-09: 1 mg via ORAL
  Administered 2023-05-10: 2 mg via ORAL
  Administered 2023-05-10 (×2): 1 mg via ORAL
  Administered 2023-05-11: 2 mg via ORAL
  Filled 2023-05-08 (×3): qty 2
  Filled 2023-05-08 (×3): qty 1

## 2023-05-08 MED ORDER — CHLORDIAZEPOXIDE HCL 5 MG PO CAPS: 5.0000 mg | ORAL_CAPSULE | Freq: Four times a day (QID) | ORAL | Status: DC

## 2023-05-08 MED ORDER — LORAZEPAM 2 MG/ML IJ SOLN
1.0000 mg | INTRAMUSCULAR | Status: DC | PRN
  Administered 2023-05-08: 1 mg via INTRAVENOUS
  Filled 2023-05-08: qty 1

## 2023-05-08 MED ORDER — THIAMINE MONONITRATE 100 MG PO TABS
100.0000 mg | ORAL_TABLET | Freq: Every day | ORAL | Status: DC
  Filled 2023-05-08: qty 1

## 2023-05-08 MED ORDER — NADOLOL 20 MG PO TABS
20.0000 mg | ORAL_TABLET | Freq: Every day | ORAL | Status: DC
  Administered 2023-05-09 – 2023-05-11 (×3): 20 mg via ORAL
  Filled 2023-05-08 (×3): qty 1

## 2023-05-08 MED ORDER — FOLIC ACID 1 MG PO TABS: 1.0000 mg | ORAL_TABLET | Freq: Every day | ORAL | Status: DC

## 2023-05-08 MED ORDER — LACTULOSE 10 GM/15ML PO SOLN
10.0000 g | Freq: Two times a day (BID) | ORAL | Status: DC
  Administered 2023-05-08: 10 g via ORAL
  Filled 2023-05-08: qty 30

## 2023-05-08 MED ORDER — PANTOPRAZOLE INFUSION (NEW) - SIMPLE MED
8.0000 mg/h | INTRAVENOUS | Status: DC
  Administered 2023-05-08 – 2023-05-10 (×5): 8 mg/h via INTRAVENOUS
  Filled 2023-05-08 (×5): qty 100

## 2023-05-08 MED ORDER — THIAMINE MONONITRATE 100 MG PO TABS
100.0000 mg | ORAL_TABLET | Freq: Every day | ORAL | Status: DC
  Administered 2023-05-08: 100 mg via ORAL
  Filled 2023-05-08: qty 1

## 2023-05-08 NOTE — ED Triage Notes (Signed)
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 He reports he has had bloody stool x 1 month, diagnosis with cirrhosis over a year ago and became clean. He relapsed over a month ago. Patient has seizure and hallucination hx with detox. Last drink of 2 shots of vodka at 1000

## 2023-05-08 NOTE — Plan of Care (Signed)

## 2023-05-08 NOTE — H&P (Addendum)
History and Physical    Patient: Omar Turner ZOX:[id number] DOB: June 28, 1971 DOA: 05/08/2023 DOS: the patient was seen and examined on 05/09/2023 PCP: Lynnea Ferrier, MD  Patient coming from: Home   Chief Complaint:  Chief Complaint  Patient presents with   Alcohol Problem    Patient came in for medical supervision while etoh detox. He reports he has had bloody stool x 1 month, diagnosis with cirrhosis over a year ago and became clean. He relapsed over a month ago. Patient has seizure and hallucination hx with detox. Last drink of 2 shots of vodka this morning.    HPI: Omar Turner is a 52 y.o. male with medical history significant for cirrhosis secondary to alcohol abuse with portal hypertension, ascites, alcoholic hepatitis, GERD, ventral hernia repair,  wanting to detox from his alcohol patient also reports bloody stools with bright red blood intermittently for the past 1 month, history of seizures and delirium with history of alcohol withdrawal and his last drink was 2 shots of vodka at 10 AM.  Patient is being sober for over a year but no relapsing.  Patient reports swelling bleeding weakness cramping.  Chart review shows patient was admitted in May 2023 .  Last GI evaluation showed portal hypertensive gastropathy, small varices, ascending colon polyp and 2 sigmoid polyps.   Chart review shows patient had MRI of the liver showing: 1. Severe hepatic steatosis and hepatic cirrhosis, as above. This is associated with portal venous hypertension, as demonstrated by borderline dilated portal vein and splenomegaly. No suspicious hepatic lesions are noted at this time to indicate hepatocellular carcinoma. 2. Trace volume of perihepatic ascites. 3. Gallbladder wall appears edematous. This is favored to be related to intrinsic hepatic disease. No gallstones are noted to suggest an acute cholecystitis at this time.   Review of Systems: Review of Systems  Gastrointestinal:  Positive for  blood in stool.  Psychiatric/Behavioral:  Positive for substance abuse.    Past Medical History:  Diagnosis Date   Anginal pain (HCC)    Anxiety    Basal cell carcinoma 11/26/2022   Superficial, left spinal upper back, clear with biopsy   Basal cell carcinoma 11/26/2022   L post neck, clear with biopsy   Basal cell carcinoma 05/29/2021   anterior neck - R anterolateral, exc at [facility]   Basal cell carcinoma 06/26/2021   L med scapular back, exc at [facility] cell carcinoma 06/12/2021   L post forearm, [facility]   Basal cell carcinoma 06/12/2021   R scapular back, [facility]   Basal cell carcinoma 06/12/2021   Central scapular back   Basal cell carcinoma 12/04/2022   Left Upper Arm - Anterior, [facility]   Basal cell carcinoma 12/04/2022   L upper chest inferior, [facility]   Basal cell carcinoma 12/04/2022   left upper chest lateral, [facility]   Basal cell carcinoma 12/04/2022   left upper chest medial, [facility]   Basal cell carcinoma 12/30/2022   R post neck, [facility]   Basal cell carcinoma 04/01/2023   left mid chest, [facility]   Degenerative disc disease, cervical    Diverticulitis large intestine    dysplastic nevus 11/26/2022   R spinal mid back, shave removal 12/30/22   GERD (gastroesophageal reflux disease)    History of chest pain    Hyperlipidemia    Hypertension    Sleep apnea    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   L post forearm, [facility]  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/26/2022   L upper mid back, in situ, clear with biopsy   Tobacco abuse    Past Surgical History:  Procedure Laterality Date   APPENDECTOMY     CARDIAC CATHETERIZATION Left 06/17/2016   Procedure: Left Heart Cath and Coronary Angiography;  Surgeon: Iran Ouch, MD;  Location: [facility] INVASIVE CV LAB;  Service: Cardiovascular;  Laterality: Left;   COLONOSCOPY     COLONOSCOPY WITH PROPOFOL N/A 09/07/2015   Procedure: COLONOSCOPY WITH PROPOFOL;  Surgeon: Wallace Cullens, MD;  Location: [facility]  ENDOSCOPY;  Service: Gastroenterology;  Laterality: N/A;   COLONOSCOPY WITH PROPOFOL N/A 05/12/2018   Procedure: COLONOSCOPY WITH PROPOFOL;  Surgeon: Wyline Mood, MD;  Location: [facility] ENDOSCOPY;  Service: Gastroenterology;  Laterality: N/A;   COLONOSCOPY WITH PROPOFOL N/A 02/26/2022   Procedure: COLONOSCOPY WITH PROPOFOL;  Surgeon: Napoleon Form, MD;  Location: [facility] ENDOSCOPY;  Service: Gastroenterology;  Laterality: N/A;   ESOPHAGOGASTRODUODENOSCOPY (EGD) WITH PROPOFOL N/A 11/16/2021   Procedure: ESOPHAGOGASTRODUODENOSCOPY (EGD) WITH PROPOFOL;  Surgeon: Jaynie Collins, DO;  Location: [facility] ENDOSCOPY;  Service: Gastroenterology;  Laterality: N/A;   ESOPHAGOGASTRODUODENOSCOPY (EGD) WITH PROPOFOL N/A 02/26/2022   Procedure: ESOPHAGOGASTRODUODENOSCOPY (EGD) WITH PROPOFOL;  Surgeon: Napoleon Form, MD;  Location: [facility] ENDOSCOPY;  Service: Gastroenterology;  Laterality: N/A;   HERNIA REPAIR     IR RADIOLOGIST EVAL & MGMT  02/01/2021   POLYPECTOMY  02/26/2022   Procedure: POLYPECTOMY;  Surgeon: Napoleon Form, MD;  Location: [facility] ENDOSCOPY;  Service: Gastroenterology;;   VENTRAL HERNIA REPAIR N/A 01/17/2021   Procedure: HERNIA REPAIR VENTRAL ADULT;  Surgeon: Leafy Ro, MD;  Location: [facility] ORS;  Service: General;  Laterality: N/A;   Social History:  reports that he has been smoking cigarettes. He has a 22.5 pack-year smoking history. He has never used smokeless tobacco. He reports current alcohol use of about 2.0 standard drinks of alcohol per week. He reports that he does not use drugs.  Allergies  Allergen Reactions   Chantix [Varenicline]    Lexapro [Escitalopram]          Family History  Problem Relation Age of Onset   Hypertension Father    Heart disease Father        CABG x 3 & valve replacement.    Heart attack Father     Prior to Admission medications   Medication Sig Start Date End Date Taking? Authorizing Provider  azithromycin (ZITHROMAX Z-PAK) 250 MG tablet 2  tablet day 1, then 1 tablet daily 11/04/22   Ivery Quale, PA-C  FEROSUL 325 (65 Fe) MG tablet Take 325 mg by mouth every morning. 10/18/21   [provider]  folic acid (FOLVITE) 1 MG tablet Take 1 mg by mouth daily. 11/14/21   [provider]  furosemide (LASIX) 40 MG tablet Take 40 mg by mouth daily. 07/31/21   [provider]  hydrOXYzine (ATARAX) 25 MG tablet Take 1 tablet (25 mg total) by mouth 3 (three) times daily as needed for anxiety. 02/26/22   Kathlen Mody, MD  lactulose (CHRONULAC) 10 GM/15ML solution Take 15 mLs (10 g total) by mouth 2 (two) times daily. 02/26/22   Kathlen Mody, MD  montelukast (SINGULAIR) 10 MG tablet Take 10 mg by mouth daily. 12/03/21   [provider]  Multiple Vitamin (MULTIVITAMIN WITH MINERALS) TABS tablet Take 1 tablet by mouth daily. 02/27/22   Kathlen Mody, MD  nadolol (CORGARD) 20 MG tablet Take 1 tablet (20 mg total) by mouth daily.  02/27/22   Kathlen Mody, MD  nicotine (NICODERM CQ - DOSED IN MG/24 HOURS) 21 mg/24hr patch Place 1 patch (21 mg total) onto the skin daily. 02/27/22   Kathlen Mody, MD  pantoprazole (PROTONIX) 40 MG tablet Take 1 tablet (40 mg total) by mouth 2 (two) times daily. 11/17/21   Marrion Coy, MD  QUEtiapine (SEROQUEL) 25 MG tablet Take 25 mg by mouth at bedtime. 02/11/22   [provider]  spironolactone (ALDACTONE) 100 MG tablet Take 100 mg by mouth daily. 07/31/21   [provider]  thiamine 100 MG tablet Take 100 mg by mouth daily. 11/15/21   [provider]     Vitals:   05/08/23 2300 05/09/23 0000 05/09/23 0025 05/09/23 0100  BP: 135/82 139/86  (!) 140/89  Pulse: 87 86  87  Resp: 16 14  15   Temp:   98 F (36.7 C)   TempSrc:   Oral   SpO2: 96% 97%  93%  Weight:      Height:       Physical Exam Vitals and nursing note reviewed.  Constitutional:      General: He is not in acute distress. HENT:     Head: Normocephalic and atraumatic.     Right Ear: Hearing  normal.     Left Ear: Hearing normal.     Nose: Nose normal. No nasal deformity.     Mouth/Throat:     Lips: Pink.     Tongue: No lesions.     Pharynx: Oropharynx is clear.  Eyes:     General: Lids are normal.     Extraocular Movements: Extraocular movements intact.  Cardiovascular:     Rate and Rhythm: Normal rate and regular rhythm.     Heart sounds: Normal heart sounds.  Pulmonary:     Effort: Pulmonary effort is normal.     Breath sounds: Normal breath sounds.  Abdominal:     General: Bowel sounds are normal. There is no distension.     Palpations: Abdomen is soft. There is no mass.     Tenderness: There is no abdominal tenderness.  Musculoskeletal:     Right lower leg: No edema.     Left lower leg: No edema.  Skin:    General: Skin is warm.  Neurological:     General: No focal deficit present.     Mental Status: He is alert and oriented to person, place, and time.     Cranial Nerves: Cranial nerves 2-12 are intact.  Psychiatric:        Attention and Perception: Attention normal.        Mood and Affect: Mood normal.        Speech: Speech normal.        Behavior: Behavior normal. Behavior is cooperative.      Labs on Admission: I have personally reviewed following labs and imaging studies  CBC: Recent Labs  Lab 05/08/23 1437 05/08/23 1806 05/08/23 2147  WBC 8.4  --   --   HGB 15.1 14.1 14.5  HCT 42.7 39.8 40.6  MCV 96.8  --   --   PLT 93*  --   --    Basic Metabolic Panel: Recent Labs  Lab 05/08/23 1437 05/08/23 2147  NA 136  --   K 3.6  --   CL 103  --   CO2 22  --   GLUCOSE 136*  --   BUN 6  --   CREATININE 0.69  --  CALCIUM 8.3*  --   MG  --  2.0   GFR: Estimated Creatinine Clearance: 136.9 mL/min (by C-G formula based on SCr of 0.69 mg/dL). Liver Function Tests: Recent Labs  Lab 05/08/23 1437  AST 249*  ALT 134*  ALKPHOS 188*  BILITOT 2.6*  PROT 7.5  ALBUMIN 3.3*   No results for input(s): "LIPASE", "AMYLASE" in the last 168  hours. Recent Labs  Lab 05/08/23 2147  AMMONIA 46*   Coagulation Profile: Recent Labs  Lab 05/08/23 2147  INR 1.2   Cardiac Enzymes: No results for input(s): "CKTOTAL", "CKMB", "CKMBINDEX", "TROPONINI" in the last 168 hours. BNP (last 3 results) No results for input(s): "PROBNP" in the last 8760 hours. HbA1C: No results for input(s): "HGBA1C" in the last 72 hours. CBG: Recent Labs  Lab 05/08/23 2136  GLUCAP 106*   Lipid Profile: No results for input(s): "CHOL", "HDL", "LDLCALC", "TRIG", "CHOLHDL", "LDLDIRECT" in the last 72 hours. Thyroid Function Tests: No results for input(s): "TSH", "T4TOTAL", "FREET4", "T3FREE", "THYROIDAB" in the last 72 hours. Anemia Panel: No results for input(s): "VITAMINB12", "FOLATE", "FERRITIN", "TIBC", "IRON", "RETICCTPCT" in the last 72 hours.   Unresulted Labs (From admission, onward)     Start     Ordered   05/09/23 0500  Comprehensive metabolic panel  Tomorrow morning,   R        05/08/23 2014   05/09/23 0500  CBC  Tomorrow morning,   R        05/08/23 2014   05/08/23 2014  HIV Antibody (routine testing w rflx)  (HIV Antibody (Routine testing w reflex) panel)  Once,   R        05/08/23 2014   05/08/23 1935  AFP tumor marker  Add-on,   AD        05/08/23 1936             Radiological Exams on Admission: Liver USG pending.   Data Reviewed: Relevant notes from primary care and specialist visits, past discharge summaries as available in EHR, including Care Everywhere. Prior diagnostic testing as pertinent to current admission diagnoses Updated medications and problem lists for reconciliation ED course, including vitals, labs, imaging, treatment and response to treatment Triage notes, nursing and pharmacy notes and ED provider's notes Notable results as noted in HPI Assessment and Plan: * Alcohol abuse with withdrawal (HCC) We will admit with CIWA/ ETOH withdrawal protocol.  Thiamine 100 mg. Monitor closely for  withdrawal. Start librium scheduled For 48 hours.  Aspiration/ seizure and withdrawal precaution.    Bright red rectal bleeding IV PPI.  NPO. GI consult.  Type/ screen.  Gi Consult - Dr. Timothy Lasso.  Repeat h/h is stable.      Alcoholic cirrhosis of liver with ascites (HCC) Compensated.  Lft elevated as below.     Latest Ref Rng & Units 05/08/2023    2:37 PM 02/26/2022    3:34 AM 02/25/2022    1:09 AM  Hepatic Function  Total Protein 6.5 - 8.1 g/dL 7.5  6.9  6.6   Albumin 3.5 - 5.0 g/dL 3.3  2.7  2.5   AST 15 - 41 U/L 249  160  180   ALT 0 - 44 U/L 134  84  88   Alk Phosphatase 38 - 126 U/L 188  172  167   Total Bilirubin 0.3 - 1.2 mg/dL 2.6  7.6  7.0   Will obtain AFP/ Ammonia / acute hepatitis panel. Usg as needed.  Portal hypertensive gastropathy (HCC) High risk for variceal bleed.cont nadolol and aldactone at 50 mg bid.   Thrombocytopenia stable and h/h stable single dose of heparin for DVT prophylaxis given Usg ordered and pending.   Gastroesophageal reflux disease without esophagitis IV PPI therapy.   Hypertension Vitals:   05/08/23 1600 05/08/23 1700 05/08/23 1730 05/08/23 1800  BP: (!) 138/96 117/84 112/79 132/80   05/08/23 1830 05/08/23 1900 05/08/23 2000 05/08/23 2100  BP: 137/82 (!) 145/90 (!) 145/91 (!) 155/98   05/08/23 2136 05/08/23 2200 05/08/23 2300 05/09/23 0000  BP: (!) 148/91 (!) 162/88 135/82 139/86  We will continue corgard and aldactone.    Tobacco abuse Nicotine patch.   Ascites Compensated.  Mild elevated ammonia level.  Lactulose daily or every other day.    DVT prophylaxis:  Heparin once   Consults:  Gastroenterology: Dr. Timothy Lasso  Advance Care Planning:    Code Status: Full Code   Family Communication:  None Disposition Plan:  Back to previous home environment  Severity of Illness: The appropriate patient status for this patient is INPATIENT. Inpatient status is judged to be reasonable and necessary in order to provide  the required intensity of service to ensure the patient's safety. The patient's presenting symptoms, physical exam findings, and initial radiographic and laboratory data in the context of their chronic comorbidities is felt to place them at high risk for further clinical deterioration. Furthermore, it is not anticipated that the patient will be medically stable for discharge from the hospital within 2 midnights of admission.   * I certify that at the point of admission it is my clinical judgment that the patient will require inpatient hospital care spanning beyond 2 midnights from the point of admission due to high intensity of service, high risk for further deterioration and high frequency of surveillance required.*  Author: Gertha Calkin, MD 05/09/2023 1:07 AM  For on call review [email or web address].

## 2023-05-08 NOTE — ED Notes (Addendum)
Pt states that they had been sober for over a year but relapsed a month or so ago. Pt had been diagnosed with cirrhosis of the liver over a year ago. Pt hasn't been eating well, feels like they are holding a lot of fluid, dealing with a lot of cramps, and hasn't taken any of their meds because the refills ran out. Pt has also been seeing bright red blood in their stool for a little over a month. Pt has had some falls recently, states "I've been falling out" and not sure that they fully passed out but felt "super crampy" afterwards. Pt decided today that this is not the path they need to be on and came to ED for help. Pt has a hx of seizures and hallucinations during detox.

## 2023-05-08 NOTE — ED Notes (Signed)
MD at bedside. 

## 2023-05-08 NOTE — ED Provider Notes (Signed)
[facility] Provider Note    Event Date/Time   First MD Initiated Contact with Patient 05/08/23 1505     (approximate)   History   Detox  HPI  Omar Turner is a 52 y.o. male who presents to the emergency department today with primary goal of admission for medically managed detox.  Patient states that he last drink this morning.  He has history of seizures and hallucinations with detox in the past.  Additionally the patient complains of GI bleed for little over a month as well as ascites.  He has not talked to his doctor about his concerns for his GI bleed or ascites.     Physical Exam   Triage Vital Signs: ED Triage Vitals  Encounter Vitals Group     BP 05/08/23 1431 (!) 141/87     Systolic BP Percentile --      Diastolic BP Percentile --      Pulse Rate 05/08/23 1431 (!) 118     Resp 05/08/23 1431 (!) 23     Temp 05/08/23 1431 98.6 F (37 C)     Temp Source 05/08/23 1431 Oral     SpO2 05/08/23 1431 95 %     Weight 05/08/23 1433 236 lb (107 kg)     Height 05/08/23 1433 5\' 11"  (1.803 m)     Head Circumference --      Peak Flow --      Pain Score 05/08/23 1432 9     Pain Loc --      Pain Education --      Exclude from Growth Chart --     Most recent vital signs: Vitals:   05/08/23 1431  BP: (!) 141/87  Pulse: (!) 118  Resp: (!) 23  Temp: 98.6 F (37 C)  SpO2: 95%   General: Awake, alert, oriented. CV:  Good peripheral perfusion. Regular rate and rhythm. Resp:  Normal effort. Lungs clear. Abd:  No distention. Diffusely tender to palpation    ED Results / Procedures / Treatments   Labs (all labs ordered are listed, but only abnormal results are displayed) Labs Reviewed  COMPREHENSIVE METABOLIC PANEL - Abnormal; Notable for the following components:      Result Value   Glucose, Bld 136 (*)    Calcium 8.3 (*)    Albumin 3.3 (*)    AST 249 (*)    ALT 134 (*)    Alkaline Phosphatase 188 (*)    Total Bilirubin 2.6 (*)    All  other components within normal limits  ETHANOL - Abnormal; Notable for the following components:   Alcohol, Ethyl (B) 167 (*)    All other components within normal limits  CBC - Abnormal; Notable for the following components:   MCH 34.2 (*)    Platelets 93 (*)    All other components within normal limits  URINE DRUG SCREEN, QUALITATIVE ([facility] ONLY)     EKG  None   RADIOLOGY None  PROCEDURES:  Critical Care performed: No    MEDICATIONS ORDERED IN ED: Medications - No data to display   IMPRESSION / MDM / ASSESSMENT AND PLAN / ED COURSE  I reviewed the triage vital signs and the nursing notes.                              Differential diagnosis includes, but is not limited to, alcohol abuse, anemia, electrolyte abnormality  Patient's  presentation is most consistent with acute presentation with potential threat to life or bodily function.   The patient is on the cardiac monitor to evaluate for evidence of arrhythmia and/or significant heart rate changes.  Patient presents to the emergency department today with primary concern for detox and secondary concerns of possible ascites and GI bleed.  On exam patient without any tremors.  Does not appear to be responding to internal stimuli.  Initial blood work with normal hemoglobin.  Repeat hemoglobin shows a drop of 1.  Guaiac positive.  Given history of GI bleeds in the past and evidence of further bleeding we will plan on admission to the hospital service.      FINAL CLINICAL IMPRESSION(S) / ED DIAGNOSES   Final diagnoses:  Gastrointestinal hemorrhage, unspecified gastrointestinal hemorrhage type  Alcohol abuse     Note:  This document was prepared using Dragon voice recognition software and may include unintentional dictation errors.    Phineas Semen, MD 05/09/23 [phone]

## 2023-05-09 ENCOUNTER — Encounter: Payer: Self-pay | Admitting: Internal Medicine

## 2023-05-09 ENCOUNTER — Observation Stay: Payer: BC Managed Care – PPO

## 2023-05-09 ENCOUNTER — Observation Stay: Payer: BC Managed Care – PPO | Admitting: Anesthesiology

## 2023-05-09 ENCOUNTER — Encounter
Admission: EM | Disposition: A | Discharge status: Home / Self Care | Payer: Self-pay | Source: Home / Self Care | Attending: Internal Medicine

## 2023-05-09 DIAGNOSIS — I10 Essential (primary) hypertension: Secondary | ICD-10-CM | POA: Diagnosis present

## 2023-05-09 DIAGNOSIS — K7031 Alcoholic cirrhosis of liver with ascites: Secondary | ICD-10-CM | POA: Diagnosis present

## 2023-05-09 DIAGNOSIS — Y906 Blood alcohol level of 120-199 mg/100 ml: Secondary | ICD-10-CM | POA: Diagnosis present

## 2023-05-09 DIAGNOSIS — K3189 Other diseases of stomach and duodenum: Secondary | ICD-10-CM | POA: Diagnosis present

## 2023-05-09 DIAGNOSIS — K920 Hematemesis: Secondary | ICD-10-CM | POA: Diagnosis present

## 2023-05-09 DIAGNOSIS — Z85828 Personal history of other malignant neoplasm of skin: Secondary | ICD-10-CM | POA: Diagnosis not present

## 2023-05-09 DIAGNOSIS — R569 Unspecified convulsions: Secondary | ICD-10-CM | POA: Diagnosis present

## 2023-05-09 DIAGNOSIS — Z79899 Other long term (current) drug therapy: Secondary | ICD-10-CM | POA: Diagnosis not present

## 2023-05-09 DIAGNOSIS — F10139 Alcohol abuse with withdrawal, unspecified: Secondary | ICD-10-CM | POA: Diagnosis not present

## 2023-05-09 DIAGNOSIS — K766 Portal hypertension: Secondary | ICD-10-CM | POA: Diagnosis present

## 2023-05-09 DIAGNOSIS — Z8249 Family history of ischemic heart disease and other diseases of the circulatory system: Secondary | ICD-10-CM | POA: Diagnosis not present

## 2023-05-09 DIAGNOSIS — D696 Thrombocytopenia, unspecified: Secondary | ICD-10-CM | POA: Diagnosis present

## 2023-05-09 DIAGNOSIS — E8809 Other disorders of plasma-protein metabolism, not elsewhere classified: Secondary | ICD-10-CM | POA: Diagnosis present

## 2023-05-09 DIAGNOSIS — K219 Gastro-esophageal reflux disease without esophagitis: Secondary | ICD-10-CM | POA: Diagnosis present

## 2023-05-09 DIAGNOSIS — K922 Gastrointestinal hemorrhage, unspecified: Secondary | ICD-10-CM | POA: Diagnosis present

## 2023-05-09 DIAGNOSIS — K5720 Diverticulitis of large intestine with perforation and abscess without bleeding: Secondary | ICD-10-CM | POA: Diagnosis present

## 2023-05-09 DIAGNOSIS — F10239 Alcohol dependence with withdrawal, unspecified: Secondary | ICD-10-CM | POA: Diagnosis present

## 2023-05-09 DIAGNOSIS — F17210 Nicotine dependence, cigarettes, uncomplicated: Secondary | ICD-10-CM | POA: Diagnosis present

## 2023-05-09 DIAGNOSIS — K7011 Alcoholic hepatitis with ascites: Secondary | ICD-10-CM | POA: Diagnosis present

## 2023-05-09 DIAGNOSIS — K760 Fatty (change of) liver, not elsewhere classified: Secondary | ICD-10-CM | POA: Diagnosis present

## 2023-05-09 DIAGNOSIS — E7849 Other hyperlipidemia: Secondary | ICD-10-CM | POA: Diagnosis present

## 2023-05-09 DIAGNOSIS — I8511 Secondary esophageal varices with bleeding: Secondary | ICD-10-CM | POA: Diagnosis present

## 2023-05-09 DIAGNOSIS — G8929 Other chronic pain: Secondary | ICD-10-CM | POA: Diagnosis present

## 2023-05-09 DIAGNOSIS — K921 Melena: Secondary | ICD-10-CM | POA: Diagnosis present

## 2023-05-09 DIAGNOSIS — Z8616 Personal history of COVID-19: Secondary | ICD-10-CM | POA: Diagnosis not present

## 2023-05-09 DIAGNOSIS — F10229 Alcohol dependence with intoxication, unspecified: Secondary | ICD-10-CM | POA: Diagnosis present

## 2023-05-09 DIAGNOSIS — E669 Obesity, unspecified: Secondary | ICD-10-CM | POA: Diagnosis present

## 2023-05-09 HISTORY — PX: ESOPHAGOGASTRODUODENOSCOPY (EGD) WITH PROPOFOL: SHX5813

## 2023-05-09 HISTORY — PX: ESOPHAGEAL BANDING: SHX5518

## 2023-05-09 LAB — TROPONIN I (HIGH SENSITIVITY)
Troponin I (High Sensitivity): 5 ng/L (ref <18)
Troponin I (High Sensitivity): 5 ng/L (ref <18)

## 2023-05-09 SURGERY — ESOPHAGOGASTRODUODENOSCOPY (EGD) WITH PROPOFOL
Anesthesia: General

## 2023-05-09 MED ORDER — SODIUM CHLORIDE 0.9 % IV SOLN
2.0000 g | INTRAVENOUS | Status: DC
  Administered 2023-05-09 – 2023-05-11 (×3): 2 g via INTRAVENOUS
  Filled 2023-05-09 (×3): qty 20

## 2023-05-09 MED ORDER — SODIUM CHLORIDE 0.9 % IV SOLN
50.0000 ug/h | INTRAVENOUS | Status: DC
  Administered 2023-05-09 – 2023-05-10 (×3): 50 ug/h via INTRAVENOUS
  Filled 2023-05-09 (×4): qty 1

## 2023-05-09 MED ORDER — PROPOFOL 10 MG/ML IV BOLUS
INTRAVENOUS | Status: AC
  Filled 2023-05-09: qty 20

## 2023-05-09 MED ORDER — SODIUM CHLORIDE 0.9 % IV SOLN: INTRAVENOUS | Status: DC

## 2023-05-09 MED ORDER — HEPARIN SODIUM (PORCINE) 5000 UNIT/ML IJ SOLN: 5000.0000 [IU] | Freq: Once | INTRAMUSCULAR | Status: DC

## 2023-05-09 MED ORDER — THIAMINE HCL 100 MG/ML IJ SOLN: 250.0000 mg | INTRAVENOUS | Status: DC

## 2023-05-09 MED ORDER — ACETAMINOPHEN 650 MG RE SUPP: 650.0000 mg | Freq: Four times a day (QID) | RECTAL | Status: DC | PRN

## 2023-05-09 MED ORDER — PROPOFOL 500 MG/50ML IV EMUL
INTRAVENOUS | Status: DC | PRN
  Administered 2023-05-09: 150 ug/kg/min via INTRAVENOUS

## 2023-05-09 MED ORDER — THIAMINE HCL 100 MG/ML IJ SOLN: 100.0000 mg | INTRAMUSCULAR | Status: DC

## 2023-05-09 MED ORDER — SPIRONOLACTONE 25 MG PO TABS
50.0000 mg | ORAL_TABLET | Freq: Every day | ORAL | Status: DC
  Administered 2023-05-10: 50 mg via ORAL
  Filled 2023-05-09: qty 2

## 2023-05-09 MED ORDER — FUROSEMIDE 20 MG PO TABS
20.0000 mg | ORAL_TABLET | Freq: Every day | ORAL | Status: DC
  Administered 2023-05-10: 20 mg via ORAL
  Filled 2023-05-09: qty 1

## 2023-05-09 MED ORDER — LACTULOSE 10 GM/15ML PO SOLN
10.0000 g | Freq: Every day | ORAL | Status: AC
  Administered 2023-05-09 – 2023-05-10 (×2): 10 g via ORAL
  Filled 2023-05-09 (×2): qty 30

## 2023-05-09 MED ORDER — LACTULOSE 10 GM/15ML PO SOLN: 10.0000 g | Freq: Every day | ORAL | Status: DC

## 2023-05-09 MED ORDER — ACETAMINOPHEN 325 MG PO TABS
650.0000 mg | ORAL_TABLET | Freq: Three times a day (TID) | ORAL | Status: DC | PRN
  Administered 2023-05-09: 650 mg via ORAL
  Filled 2023-05-09: qty 2

## 2023-05-09 MED ORDER — THIAMINE HCL 100 MG/ML IJ SOLN
500.0000 mg | Freq: Three times a day (TID) | INTRAVENOUS | Status: DC
  Administered 2023-05-09 – 2023-05-10 (×3): 500 mg via INTRAVENOUS
  Filled 2023-05-09 (×5): qty 5

## 2023-05-09 MED ORDER — CHLORDIAZEPOXIDE HCL 5 MG PO CAPS
5.0000 mg | ORAL_CAPSULE | Freq: Four times a day (QID) | ORAL | Status: AC
  Administered 2023-05-09 – 2023-05-10 (×8): 5 mg via ORAL
  Filled 2023-05-09 (×8): qty 1

## 2023-05-09 MED ORDER — PROPOFOL 10 MG/ML IV BOLUS
INTRAVENOUS | Status: DC | PRN
  Administered 2023-05-09: 40 mg via INTRAVENOUS

## 2023-05-09 MED ORDER — SPIRONOLACTONE 25 MG PO TABS
50.0000 mg | ORAL_TABLET | Freq: Two times a day (BID) | ORAL | Status: DC
  Administered 2023-05-09 (×2): 50 mg via ORAL
  Filled 2023-05-09 (×2): qty 2

## 2023-05-09 MED ORDER — FUROSEMIDE 20 MG PO TABS: 20.0000 mg | ORAL_TABLET | Freq: Every day | ORAL | Status: DC

## 2023-05-09 MED ORDER — MORPHINE SULFATE (PF) 2 MG/ML IV SOLN
2.0000 mg | INTRAVENOUS | Status: DC | PRN
  Administered 2023-05-09 – 2023-05-11 (×7): 2 mg via INTRAVENOUS
  Filled 2023-05-09 (×7): qty 1

## 2023-05-09 MED ORDER — OCTREOTIDE LOAD VIA INFUSION
50.0000 ug | Freq: Once | INTRAVENOUS | Status: AC
  Administered 2023-05-09: 50 ug via INTRAVENOUS
  Filled 2023-05-09: qty 25

## 2023-05-09 NOTE — Transfer of Care (Signed)
Immediate Anesthesia Transfer of Care Note  Patient: Omar Turner  Procedure(s) Performed: ESOPHAGOGASTRODUODENOSCOPY (EGD) WITH PROPOFOL  Patient Location: PACU  Anesthesia Type:General  Level of Consciousness: awake, alert , and oriented  Airway & Oxygen Therapy: Patient Spontanous Breathing and Patient connected to face mask oxygen  Post-op Assessment: Report given to RN, Post -op Vital signs reviewed and stable, and Patient moving all extremities X 4  Post vital signs: Reviewed and stable  Last Vitals:  Vitals Value Taken Time  BP 153/96 05/09/23 1359  Temp 36.4 C 05/09/23 1357  Pulse 85 05/09/23 1400  Resp 16 05/09/23 1400  SpO2 97 % 05/09/23 1400  Vitals shown include unfiled device data.  Last Pain:  Vitals:   05/09/23 1357  TempSrc: Temporal  PainSc: Asleep      Patients Stated Pain Goal: 0 (05/09/23 0025)  Complications: No notable events documented.

## 2023-05-09 NOTE — Anesthesia Procedure Notes (Signed)
Procedure Name: MAC Date/Time: 05/09/2023 1:42 PM  Performed by: Nelle Don, CRNAPre-anesthesia Checklist: Patient identified, Emergency Drugs available, Suction available and Patient being monitored Oxygen Delivery Method: Simple face mask

## 2023-05-09 NOTE — Assessment & Plan Note (Signed)
Compensated.  Mild elevated ammonia level.  Lactulose daily or every other day.

## 2023-05-09 NOTE — H&P (View-Only) (Signed)
GI Inpatient Consult Note  Reason for Consult: GI Bleed, decompensated alcoholic cirrhosis    Attending Requesting Consult: Dr. Lonny Prude  History of Present Illness: Omar Turner is a 52 y.o. male seen for evaluation of GI bleed and decompensated alcoholic cirrhosis at the request of admitting hospitalist - Dr. Lonny Prude. Patient has a PMH of HTN, HLD, obesity, GERD, anxiety, DDD, tobacco abuse, hx of skin cancer, and alcoholic cirrhosis of the liver c/b ascites and EV. He presented to the [facility] ED 8/1 for chief complaint of primary goal of admission for medically managed detox. He is followed by myself in the outpatient setting for alcoholic cirrhosis c/b abdominal ascites and EV on Nadolol and last seen in office March 2024 and scheduled for repeat EGD. EGD was scheduled to be done in June 2024, but patient cancelled because co-pay was too high ($~1400). He had been avoiding EtOH since June 2023, but reports he has relapsed over the past 2 months and has been drinking at least 1/5th of vodka a day. He doesn't know why he started back drinking but is upset that he did. He believes his drinking is a major problem and is impacting his life. He reports over the past month he has been seeing bright red blood intermittently in his stools. He has also seen dark, tarry stools as well. His last drink was 10 AM yesterday. He reports he ran out of his medications and has not been taking his diuretics or beta blocker. He has noticed over the past month he has been gaining weight and abdomen has become more distended. His ankles have been swelling. He endorses nausea without any episodes of vomiting. He denies any chest pain, shortness of breath, dysphagia, odynophagia, or indigestion. He is currently living by himself. He reports abdominal distention and generalized abdominal pain, rated 3/10 in severity. He denies any jaundice, pruritus, altered mental status, sleep disturbances, or increased irritability.  Upon presentation to the ED, he was hypertensive (141/87), tachycardic (118), and tachypneic (23). Labs significant for platelets 93K, WBC 8.4K, hemoglobin 15.1, MCV 97, AST 249, ALT 134, alk phos 188, total bilirubin 2.6, ethanol 167, ammonia 46, and acute hepatitis panel negative. UDS was negative. US Liver commented on thickened gallbladder without stone and poor visualization of the liver due to hepatocellular disease. GI consulted for further evaluation and management.    Summary of GI Procedures:  EGD 02/26/2022 - three columns of Grade II EV with no bleeding and no red wale signs, PHG, normal examined duodenum  CSY 02/26/2022 - three 4-7 mm polyps removed from sigmoid and ascending colon with path showing TA and hyperplastic polyp, sigmoid diverticulosis   EGD 11/16/2021 - regular Z-line, LA Grade A esophagitis with no bleeding, portal hypertensive gastropathy, gastritis, normal examined duodenum   EGD 12/13/2020 - normal esophagus, 2 cm hiatal hernia, portal hypertensive gastropathy, normal examined duodenum  CSY 12/13/2020 - sigmoid diverticulosis, four 2-7 mm polyps removed from rectum, transverse, and ascending colon with path showing TA x2 and hyperplastic x2  CSY 05/12/2018 - sigmoid diverticulosis, two 3-4 mm TA removed from ascending colon, two 4-6 mm TA removed from transverse and ascending colon, four 5-7 mm hyperplastic polyps removed from rectum, three 8-10 mm TA removed from sigmoid colon  CSY 09/07/2015 - one diminutive TA removed from transverse colon, otherwise normal   Past Medical History:  Past Medical History:  Diagnosis Date   Anginal pain (HCC)    Anxiety    Basal cell  carcinoma 11/26/2022   Superficial, left spinal upper back, clear with biopsy   Basal cell carcinoma 11/26/2022   L post neck, clear with biopsy   Basal cell carcinoma 05/29/2021   anterior neck - R anterolateral, exc at [facility]   Basal cell carcinoma 06/26/2021   L med scapular back, exc at [facility] cell carcinoma 06/12/2021   L post forearm, [facility] Minion   Basal cell carcinoma 06/12/2021   R scapular back, [facility] Minion   Basal cell carcinoma 06/12/2021   Central scapular back   Basal cell carcinoma 12/04/2022   Left Upper Arm - Anterior, [facility]   Basal cell carcinoma 12/04/2022   L upper chest inferior, [facility]   Basal cell carcinoma 12/04/2022   left upper chest lateral, [facility]   Basal cell carcinoma 12/04/2022   left upper chest medial, [facility]   Basal cell carcinoma 12/30/2022   R post neck, [facility]   Basal cell carcinoma 04/01/2023   left mid chest, [facility]   Degenerative disc disease, cervical    Diverticulitis large intestine    dysplastic nevus 11/26/2022   R spinal mid back, shave removal 12/30/22   GERD (gastroesophageal reflux disease)    History of chest pain    Hyperlipidemia    Hypertension    Sleep apnea    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     Problem List: Patient Active Problem List   Diagnosis Date Noted   Bright red rectal bleeding 05/08/2023   Alcohol abuse 05/08/2023   Acute upper respiratory infection 11/04/2022   Acute bronchitis 11/04/2022   Adenomatous polyp of ascending colon    Abnormal CT scan, colon    Alcoholic hepatitis with ascites 02/22/2022   Gastritis with bleeding 11/17/2021   Shortness of breath 11/16/2021   Gastritis without bleeding 11/16/2021   Acute upper GI bleed 11/16/2021   Hematemesis 11/15/2021   Alcoholic cirrhosis of liver with ascites (HCC) 11/15/2021   Electrolyte abnormality 11/15/2021   Incarcerated ventral hernia 01/17/2021   Other specified anemias 01/03/2021   Alcohol dependence (HCC) 01/02/2021   Portal hypertensive gastropathy (HCC) 01/02/2021   Ascites 12/11/2020   Bilateral lower extremity edema 12/11/2020   Melena 12/11/2020   COVID-19 virus infection 09/23/2020    Elevated LFTs 09/23/2020   Hypokalemia 09/23/2020   Nausea & vomiting 09/23/2020   Alcohol abuse with withdrawal (HCC) 08/09/2019   DDD (degenerative disc disease), cervical 03/20/2018   Hypertension 03/20/2018   Sleep apnea 03/20/2018   Diverticulitis of large intestine with perforation without abscess or bleeding 03/15/2018   Gastroesophageal reflux disease without esophagitis 04/03/2017   History of chest pain 04/03/2017   Other hyperlipidemia 04/03/2017   Precordial pain    Tobacco abuse 08/21/2015   Anxiety state 02/15/2015    Past Surgical History: Past Surgical History:  Procedure Laterality Date   APPENDECTOMY     CARDIAC CATHETERIZATION Left 06/17/2016   Procedure: Left Heart Cath and Coronary Angiography;  Surgeon: Iran Ouch, MD;  Location: [facility] INVASIVE CV LAB;  Service: Cardiovascular;  Laterality: Left;   COLONOSCOPY     COLONOSCOPY WITH PROPOFOL N/A 09/07/2015   Procedure: COLONOSCOPY WITH PROPOFOL;  Surgeon: Wallace Cullens, MD;  Location: [facility] ENDOSCOPY;  Service: Gastroenterology;  Laterality: N/A;   COLONOSCOPY WITH PROPOFOL N/A 05/12/2018  Procedure: COLONOSCOPY WITH PROPOFOL;  Surgeon: Wyline Mood, MD;  Location: [facility] ENDOSCOPY;  Service: Gastroenterology;  Laterality: N/A;   COLONOSCOPY WITH PROPOFOL N/A 02/26/2022   Procedure: COLONOSCOPY WITH PROPOFOL;  Surgeon: Napoleon Form, MD;  Location: [facility] ENDOSCOPY;  Service: Gastroenterology;  Laterality: N/A;   ESOPHAGOGASTRODUODENOSCOPY (EGD) WITH PROPOFOL N/A 11/16/2021   Procedure: ESOPHAGOGASTRODUODENOSCOPY (EGD) WITH PROPOFOL;  Surgeon: Jaynie Collins, DO;  Location: [facility] ENDOSCOPY;  Service: Gastroenterology;  Laterality: N/A;   ESOPHAGOGASTRODUODENOSCOPY (EGD) WITH PROPOFOL N/A 02/26/2022   Procedure: ESOPHAGOGASTRODUODENOSCOPY (EGD) WITH PROPOFOL;  Surgeon: Napoleon Form, MD;  Location: [facility] ENDOSCOPY;  Service: Gastroenterology;  Laterality: N/A;   HERNIA REPAIR     IR RADIOLOGIST EVAL & MGMT   02/01/2021   POLYPECTOMY  02/26/2022   Procedure: POLYPECTOMY;  Surgeon: Napoleon Form, MD;  Location: [facility] ENDOSCOPY;  Service: Gastroenterology;;   VENTRAL HERNIA REPAIR N/A 01/17/2021   Procedure: HERNIA REPAIR VENTRAL ADULT;  Surgeon: Leafy Ro, MD;  Location: [facility] ORS;  Service: General;  Laterality: N/A;    Allergies: Allergies  Allergen Reactions   Chantix [Varenicline]    Lexapro [Escitalopram]          Home Medications: Medications Prior to Admission  Medication Sig Dispense Refill Last Dose   azithromycin (ZITHROMAX Z-PAK) 250 MG tablet 2 tablet day 1, then 1 tablet daily 6 each 0    FEROSUL 325 (65 Fe) MG tablet Take 325 mg by mouth every morning.      folic acid (FOLVITE) 1 MG tablet Take 1 mg by mouth daily.      furosemide (LASIX) 40 MG tablet Take 40 mg by mouth daily.      hydrOXYzine (ATARAX) 25 MG tablet Take 1 tablet (25 mg total) by mouth 3 (three) times daily as needed for anxiety. 30 tablet 0    lactulose (CHRONULAC) 10 GM/15ML solution Take 15 mLs (10 g total) by mouth 2 (two) times daily. 236 mL 0    montelukast (SINGULAIR) 10 MG tablet Take 10 mg by mouth daily.      Multiple Vitamin (MULTIVITAMIN WITH MINERALS) TABS tablet Take 1 tablet by mouth daily.      nadolol (CORGARD) 20 MG tablet Take 1 tablet (20 mg total) by mouth daily. 30 tablet 1    nicotine (NICODERM CQ - DOSED IN MG/24 HOURS) 21 mg/24hr patch Place 1 patch (21 mg total) onto the skin daily. 28 patch 0    pantoprazole (PROTONIX) 40 MG tablet Take 1 tablet (40 mg total) by mouth 2 (two) times daily. 60 tablet 1    QUEtiapine (SEROQUEL) 25 MG tablet Take 25 mg by mouth at bedtime.      spironolactone (ALDACTONE) 100 MG tablet Take 100 mg by mouth daily.      thiamine 100 MG tablet Take 100 mg by mouth daily.      Home medication reconciliation was completed with the patient.   Scheduled Inpatient Medications:    chlordiazePOXIDE  5 mg Oral QID   Chlorhexidine Gluconate Cloth  6 each  Topical Daily   folic acid  1 mg Oral Daily   lactulose  10 g Oral Daily   multivitamin with minerals  1 tablet Oral Daily   nadolol  20 mg Oral Daily   nicotine  14 mg Transdermal Daily   QUEtiapine  25 mg Oral QHS   sodium chloride flush  3 mL Intravenous Q12H   spironolactone  50 mg Oral BID   thiamine  100 mg  Oral Daily   Or   thiamine  100 mg Intravenous Daily    Continuous Inpatient Infusions:    pantoprazole 8 mg/hr (05/09/23 0600)    PRN Inpatient Medications:  acetaminophen **OR** acetaminophen, LORazepam **OR** LORazepam  Family History: family history includes Heart attack in his father; Heart disease in his father; Hypertension in his father.  The patient's family history is negative for inflammatory bowel disorders, GI malignancy, or solid organ transplantation.  Social History:   reports that he has been smoking cigarettes. He has a 22.5 pack-year smoking history. He has never used smokeless tobacco. He reports current alcohol use of about 2.0 standard drinks of alcohol per week. He reports that he does not use drugs. The patient denies ETOH, tobacco, or drug use.   Review of Systems: Constitutional: + weight gain  Eyes: No changes in vision. ENT: No oral lesions, sore throat.  GI: see HPI.  Heme/Lymph: + easy bruising, + bleeding  CV: No chest pain.  GU: No hematuria.  Integumentary: No rashes.  Neuro: No headaches.  Psych: No depression/anxiety.  Endocrine: No heat/cold intolerance.  Allergic/Immunologic: No urticaria.  Resp: No cough, SOB.  Musculoskeletal: No joint swelling.    Physical Examination: BP 127/71   Pulse 81   Temp 98.1 F (36.7 C) (Oral)   Resp 20   Ht 5\' 11"  (1.803 m)   Wt 110.5 kg   SpO2 96%   BMI 33.98 kg/m  Gen: NAD, alert and oriented x 4 HEENT: PEERLA, EOMI, anicteric  Neck: supple, no JVD or thyromegaly Chest: CTA bilaterally, no wheezes, crackles, or other adventitious sounds CV: RRR, no m/g/c/r Abd: soft, mildly  distended obese abdomen, +BS in all four quadrants; nontender to deep palpation in all four quadrants, no HSM, guarding, ridigity, or rebound tenderness, no fluid wave  Ext: no edema, well perfused with 2+ pulses, Skin: no rash or lesions noted Lymph: no LAD  Data: Lab Results  Component Value Date   WBC 5.3 05/09/2023   HGB 13.7 05/09/2023   HCT 37.6 (L) 05/09/2023   MCV 94.7 05/09/2023   PLT 67 (L) 05/09/2023   Recent Labs  Lab 05/08/23 1806 05/08/23 2147 05/09/23 0433  HGB 14.1 14.5 13.7   Lab Results  Component Value Date   NA 137 05/09/2023   K 3.6 05/09/2023   CL 104 05/09/2023   CO2 24 05/09/2023   BUN 7 05/09/2023   CREATININE 0.59 (L) 05/09/2023   Lab Results  Component Value Date   ALT 118 (H) 05/09/2023   AST 221 (H) 05/09/2023   ALKPHOS 144 (H) 05/09/2023   BILITOT 3.8 (H) 05/09/2023   Recent Labs  Lab 05/08/23 2147  INR 1.2   RUQ [place] 05/09/2023: FINDINGS: Gallbladder:   Low-density and thick walled gallbladder without stone. The wall measures up to 1 cm in thickness. No focal tenderness.   Common bile duct:   Diameter: 4 mm   Liver:   There is very limited assessment given the degree of echogenicity and poor acoustic penetration. History of cirrhosis with nodular surface. Portal vein is patent on color Doppler imaging with normal direction of blood flow towards the liver.   Other: Trace perihepatic fluid.   IMPRESSION: 1. Thickened gallbladder without tenderness or stone, a reactive pattern. 2. Poor visualization of the liver due to hepatocellular disease.   Assessment/Plan:  52 y/o Caucasian male with a PMH of HTN, HLD, obesity, GERD, anxiety, DDD, tobacco abuse, hx of skin cancer, and alcoholic cirrhosis of  the liver c/b ascites and EV presented to the [facility] ED 8/1 for chief complaint of wanting medically managed EtOH detox and hematochezia x 1 month.   Alcoholic cirrhosis of the liver c/b ascites and EV - MELD 3.0 15, Maddrey DF 16.2  suggests no indication for glucocorticoids at this time.   Hematochezia/Melena - no overt GIB at this time, H&H stable. DDx includes anal outlet etiology from internal hemorrhoids, varices, esophagitis, PUD, gastritis, AVMs, Dieulafoy's lesion, GAVE, polyp, neoplasm, etc  Grade II esophageal varices - seen on last EGD 02/2022, no hx of variceal bleed  Abdominal distention - c/w ascites   Portal hypertensive gastropathy w/ thrombocytopenia, splenomegaly, and hypoalbuminemia   EtOH abuse, recent relapse - high risk of withdrawal   Elevated LFTs - likely 2/2 recent relapse and daily EtOH abuse   Tobacco abuse   Recommendations:  - Maintain 2 large bore IVs for access - Orders placed for octreotide 50 mcg bolus and 50 mcg gtt and Ceftriaxone 1 gram IV daily given evidence of cirrhosis with concerns for bleeding - Continue Protonix gtt for gastric protection - H&H stable currently with no evidence of overt GI bleeding or significant hemodynamic instability - Continue to monitor for signs of GI bleeding  - Continue diuretics with Lasix 20 mg daily and Spironolactone 50 mg daily - Continue Lactulose 30 mL 1-2 times daily and titrate to 2-4 soft bowel movements daily - Continue Nadolol 20 mg daily  - Advise EGD this afternoon with Dr. Norma Fredrickson for luminal evaluation to rule out upper GI bleed +/- variceal banding +/- biopsies +/- endoscopic hemostasis - Avoid NSAIDs. Avoid hepatotoxins.  - Continue supportive care per primary care team - Continue to monitor and trend LFTs - CIWA protocol - High dose thiamine  - Reviewed importance of complete alcohol cessation. He would benefit from AA and substance abuse resources  - GI following along with you - NPO for now  I reviewed the risks (including bleeding, perforation, infection, anesthesia complications, cardiac/respiratory complications), benefits and alternatives of EGD. Patient consents to proceed.    Thank you for the consult. Please call  with questions or concerns.  Gilda Crease, PA-C [facility] Gastroenterology [phone]

## 2023-05-09 NOTE — TOC CM/SW Note (Signed)
TOC consult for SA resources. Resources added to AVS.  Alfonso Ramus, LCSW Transitions of Care Department [phone]

## 2023-05-09 NOTE — Assessment & Plan Note (Signed)
Compensated.  Lft elevated as below.     Latest Ref Rng & Units 05/08/2023    2:37 PM 02/26/2022    3:34 AM 02/25/2022    1:09 AM  Hepatic Function  Total Protein 6.5 - 8.1 g/dL 7.5  6.9  6.6   Albumin 3.5 - 5.0 g/dL 3.3  2.7  2.5   AST 15 - 41 U/L 249  160  180   ALT 0 - 44 U/L 134  84  88   Alk Phosphatase 38 - 126 U/L 188  172  167   Total Bilirubin 0.3 - 1.2 mg/dL 2.6  7.6  7.0   Will obtain AFP/ Ammonia / acute hepatitis panel. Usg as needed.

## 2023-05-09 NOTE — Assessment & Plan Note (Signed)
IV PPI therapy.  

## 2023-05-09 NOTE — Assessment & Plan Note (Signed)
-  Nicotine patch 

## 2023-05-09 NOTE — Assessment & Plan Note (Signed)
IV PPI.  NPO. GI consult.  Type/ screen.  Gi Consult - Dr. Timothy Lasso.  Repeat h/h is stable.

## 2023-05-09 NOTE — Op Note (Signed)
[facility] Gastroenterology Patient Name: Darshawn Boateng Procedure Date: 05/09/2023 1:07 PM MRN: [id number] Account #: [id number] Date of Birth: 08-09-1971 Admit Type: Inpatient Age: 52 Room: [facility] ENDO ROOM 1 Gender: Male Note Status: Finalized Instrument Name: Upper Endoscope [id number] Procedure:             Upper GI endoscopy Indications:           Hematemesis, Melena, Esophageal varices Providers:             Boykin Nearing. Norma Fredrickson MD, MD Referring MD:          Daniel Nones, MD (Referring MD) Medicines:             Propofol per Anesthesia Complications:         No immediate complications. Estimated blood loss: None. Procedure:             Pre-Anesthesia Assessment:                        - The risks and benefits of the procedure and the                         sedation options and risks were discussed with the                         patient. All questions were answered and informed                         consent was obtained.                        - Patient identification and proposed procedure were                         verified prior to the procedure by the nurse. The                         procedure was verified in the procedure room.                        - ASA Grade Assessment: III - A patient with severe                         systemic disease.                        - After reviewing the risks and benefits, the patient                         was deemed in satisfactory condition to undergo the                         procedure.                        After obtaining informed consent, the endoscope was                         passed under direct vision. Throughout the procedure,  the patient's blood pressure, pulse, and oxygen                         saturations were monitored continuously. The Endoscope                         was introduced through the mouth, and advanced to the                         third part of duodenum. The  upper GI endoscopy was                         accomplished without difficulty. The patient tolerated                         the procedure well. Findings:      The examined duodenum was normal.      Moderate portal hypertensive gastropathy was found in the entire       examined stomach.      There is no endoscopic evidence of bleeding, ulceration or varices in       the entire examined stomach.      Grade II varices were found in the middle third of the esophagus and in       the lower third of the esophagus. Two bands were successfully placed       with complete eradication, resulting in deflation of varices. There was       no bleeding during and at the end of the procedure. Estimated blood       loss: none.      The exam was otherwise without abnormality. Impression:            - Normal examined duodenum.                        - Portal hypertensive gastropathy.                        - Grade II esophageal varices. Completely eradicated.                         Banded.                        - The examination was otherwise normal.                        - No specimens collected. Recommendation:        - Return patient to hospital ward for ongoing care.                        - Clear liquid diet.                        - Advance diet as tolerated.                        - Continue present medications.                        - GI service will continue to follow the patient's  progress and closely observe for any changes in                         clinical status. Procedure Code(s):     --- Professional ---                        [phone], Esophagogastroduodenoscopy, flexible,                         transoral; with band ligation of esophageal/gastric                         varices Diagnosis Code(s):     --- Professional ---                        K92.1, Melena (includes Hematochezia)                        K92.0, Hematemesis                        I85.00,  Esophageal varices without bleeding                        K31.89, Other diseases of stomach and duodenum                        K76.6, Portal hypertension CPT copyright 2022 American Medical Association. All rights reserved. The codes documented in this report are preliminary and upon coder review may  be revised to meet current compliance requirements. Stanton Kidney MD, MD 05/09/2023 2:00:32 PM This report has been signed electronically. Number of Addenda: 0 Note Initiated On: 05/09/2023 1:07 PM Estimated Blood Loss:  Estimated blood loss: none.      [facility]

## 2023-05-09 NOTE — Anesthesia Postprocedure Evaluation (Signed)
Anesthesia Post Note  Patient: Omar Turner  Procedure(s) Performed: ESOPHAGOGASTRODUODENOSCOPY (EGD) WITH PROPOFOL ESOPHAGEAL BANDING  Patient location during evaluation: PACU Anesthesia Type: General Level of consciousness: awake and alert, oriented and patient cooperative Pain management: pain level controlled Vital Signs Assessment: post-procedure vital signs reviewed and stable Respiratory status: spontaneous breathing, nonlabored ventilation and respiratory function stable Cardiovascular status: blood pressure returned to baseline and stable Postop Assessment: adequate PO intake Anesthetic complications: no   No notable events documented.   Last Vitals:  Vitals:   05/09/23 1357 05/09/23 1427  BP: (!) 153/96   Pulse:    Resp:    Temp: 36.4 C   SpO2:  98%    Last Pain:  Vitals:   05/09/23 1427  TempSrc:   PainSc: 0-No pain                 Reed Breech

## 2023-05-09 NOTE — Progress Notes (Signed)
pt is complaining of chest pain across his chest 7/10, not radiating. pt states it feels like a "constant pressure". Pt is NRS on tele HR 86 other vitals are stable at this time. Dr. Margette Fast notified. Per MD will order STAT trop and EKG.

## 2023-05-09 NOTE — Interval H&P Note (Signed)
History and Physical Interval Note:  05/09/2023 1:28 PM  Omar Turner  has presented today for surgery, with the diagnosis of Hematochezia, Melena, Alcoholic cirrhosis of the liver, esophageal varices in cirrhosis.  The various methods of treatment have been discussed with the patient and family. After consideration of risks, benefits and other options for treatment, the patient has consented to  Procedure(s): ESOPHAGOGASTRODUODENOSCOPY (EGD) WITH PROPOFOL (N/A) as a surgical intervention.  The patient's history has been reviewed, patient examined, no change in status, stable for surgery.  I have reviewed the patient's chart and labs.  Questions were answered to the patient's satisfaction.     [place], [place]

## 2023-05-09 NOTE — Plan of Care (Signed)

## 2023-05-09 NOTE — Anesthesia Preprocedure Evaluation (Addendum)
Anesthesia Evaluation  Patient identified by MRN, date of birth, ID band Patient awake    Reviewed: Allergy & Precautions, NPO status , Patient's Chart, lab work & pertinent test results  History of Anesthesia Complications Negative for: history of anesthetic complications  Airway Mallampati: III   Neck ROM: Full    Dental no notable dental hx.    Pulmonary sleep apnea , COPD, Current Smoker (2 ppd) and Patient abstained from smoking.   Pulmonary exam normal breath sounds clear to auscultation       Cardiovascular hypertension, Normal cardiovascular exam Rhythm:Regular Rate:Normal     Neuro/Psych  PSYCHIATRIC DISORDERS Anxiety     Alcohol use disorder, fifth of alcohol daily, on CIWA protocol    GI/Hepatic ,GERD  ,,(+) Cirrhosis   ascites    NAFLD   Endo/Other  Obesity   Renal/GU      Musculoskeletal   Abdominal   Peds  Hematology  (+) Blood dyscrasia (chronic thrombocytopenia), anemia   Anesthesia Other Findings   Reproductive/Obstetrics                             Anesthesia Physical Anesthesia Plan  ASA: 3  Anesthesia Plan: General   Post-op Pain Management:    Induction: Intravenous  PONV Risk Score and Plan: 1 and Propofol infusion, TIVA and Treatment may vary due to age or medical condition  Airway Management Planned: Natural Airway  Additional Equipment:   Intra-op Plan:   Post-operative Plan:   Informed Consent: I have reviewed the patients History and Physical, chart, labs and discussed the procedure including the risks, benefits and alternatives for the proposed anesthesia with the patient or authorized representative who has indicated his/her understanding and acceptance.       Plan Discussed with: CRNA  Anesthesia Plan Comments: (LMA/GETA backup discussed.  Patient consented for risks of anesthesia including but not limited to:  - adverse reactions to  medications - damage to eyes, teeth, lips or other oral mucosa - nerve damage due to positioning  - sore throat or hoarseness - damage to heart, brain, nerves, lungs, other parts of body or loss of life  Informed patient about role of CRNA in peri- and intra-operative care.  Patient voiced understanding.)        Anesthesia Quick Evaluation

## 2023-05-09 NOTE — Plan of Care (Signed)

## 2023-05-09 NOTE — Consult Note (Signed)
GI Inpatient Consult Note  Reason for Consult: GI Bleed, decompensated alcoholic cirrhosis    Attending Requesting Consult: Dr. Lonny Prude  History of Present Illness: Omar Turner is a 52 y.o. male seen for evaluation of GI bleed and decompensated alcoholic cirrhosis at the request of admitting hospitalist - Dr. Lonny Prude. Patient has a PMH of HTN, HLD, obesity, GERD, anxiety, DDD, tobacco abuse, hx of skin cancer, and alcoholic cirrhosis of the liver c/b ascites and EV. He presented to the [facility] ED 8/1 for chief complaint of primary goal of admission for medically managed detox. He is followed by myself in the outpatient setting for alcoholic cirrhosis c/b abdominal ascites and EV on Nadolol and last seen in office March 2024 and scheduled for repeat EGD. EGD was scheduled to be done in June 2024, but patient cancelled because co-pay was too high ($~1400). He had been avoiding EtOH since June 2023, but reports he has relapsed over the past 2 months and has been drinking at least 1/5th of vodka a day. He doesn't know why he started back drinking but is upset that he did. He believes his drinking is a major problem and is impacting his life. He reports over the past month he has been seeing bright red blood intermittently in his stools. He has also seen dark, tarry stools as well. His last drink was 10 AM yesterday. He reports he ran out of his medications and has not been taking his diuretics or beta blocker. He has noticed over the past month he has been gaining weight and abdomen has become more distended. His ankles have been swelling. He endorses nausea without any episodes of vomiting. He denies any chest pain, shortness of breath, dysphagia, odynophagia, or indigestion. He is currently living by himself. He reports abdominal distention and generalized abdominal pain, rated 3/10 in severity. He denies any jaundice, pruritus, altered mental status, sleep disturbances, or increased irritability.  Upon presentation to the ED, he was hypertensive (141/87), tachycardic (118), and tachypneic (23). Labs significant for platelets 93K, WBC 8.4K, hemoglobin 15.1, MCV 97, AST 249, ALT 134, alk phos 188, total bilirubin 2.6, ethanol 167, ammonia 46, and acute hepatitis panel negative. UDS was negative. US Liver commented on thickened gallbladder without stone and poor visualization of the liver due to hepatocellular disease. GI consulted for further evaluation and management.    Summary of GI Procedures:  EGD 02/26/2022 - three columns of Grade II EV with no bleeding and no red wale signs, PHG, normal examined duodenum  CSY 02/26/2022 - three 4-7 mm polyps removed from sigmoid and ascending colon with path showing TA and hyperplastic polyp, sigmoid diverticulosis   EGD 11/16/2021 - regular Z-line, LA Grade A esophagitis with no bleeding, portal hypertensive gastropathy, gastritis, normal examined duodenum   EGD 12/13/2020 - normal esophagus, 2 cm hiatal hernia, portal hypertensive gastropathy, normal examined duodenum  CSY 12/13/2020 - sigmoid diverticulosis, four 2-7 mm polyps removed from rectum, transverse, and ascending colon with path showing TA x2 and hyperplastic x2  CSY 05/12/2018 - sigmoid diverticulosis, two 3-4 mm TA removed from ascending colon, two 4-6 mm TA removed from transverse and ascending colon, four 5-7 mm hyperplastic polyps removed from rectum, three 8-10 mm TA removed from sigmoid colon  CSY 09/07/2015 - one diminutive TA removed from transverse colon, otherwise normal   Past Medical History:  Past Medical History:  Diagnosis Date   Anginal pain (HCC)    Anxiety    Basal cell  carcinoma 11/26/2022   Superficial, left spinal upper back, clear with biopsy   Basal cell carcinoma 11/26/2022   L post neck, clear with biopsy   Basal cell carcinoma 05/29/2021   anterior neck - R anterolateral, exc at [facility]   Basal cell carcinoma 06/26/2021   L med scapular back, exc at [facility] cell carcinoma 06/12/2021   L post forearm, [facility]   Basal cell carcinoma 06/12/2021   R scapular back, [facility]ion   Basal cell carcinoma 06/12/2021   Central scapular back   Basal cell carcinoma 12/04/2022   Left Upper Arm - Anterior, [facility]   Basal cell carcinoma 12/04/2022   L upper chest inferior, [facility]   Basal cell carcinoma 12/04/2022   left upper chest lateral, [facility]   Basal cell carcinoma 12/04/2022   left upper chest medial, [facility]   Basal cell carcinoma 12/30/2022   R post neck, [facility]   Basal cell carcinoma 04/01/2023   left mid chest, [facility]   Degenerative disc disease, cervical    Diverticulitis large intestine    dysplastic nevus 11/26/2022   R spinal mid back, shave removal 12/30/22   GERD (gastroesophageal reflux disease)    History of chest pain    Hyperlipidemia    Hypertension    Sleep apnea    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     Problem List: Patient Active Problem List   Diagnosis Date Noted   Bright red rectal bleeding 05/08/2023   Alcohol abuse 05/08/2023   Acute upper respiratory infection 11/04/2022   Acute bronchitis 11/04/2022   Adenomatous polyp of ascending colon    Abnormal CT scan, colon    Alcoholic hepatitis with ascites 02/22/2022   Gastritis with bleeding 11/17/2021   Shortness of breath 11/16/2021   Gastritis without bleeding 11/16/2021   Acute upper GI bleed 11/16/2021   Hematemesis 11/15/2021   Alcoholic cirrhosis of liver with ascites (HCC) 11/15/2021   Electrolyte abnormality 11/15/2021   Incarcerated ventral hernia 01/17/2021   Other specified anemias 01/03/2021   Alcohol dependence (HCC) 01/02/2021   Portal hypertensive gastropathy (HCC) 01/02/2021   Ascites 12/11/2020   Bilateral lower extremity edema 12/11/2020   Melena 12/11/2020   COVID-19 virus infection 09/23/2020    Elevated LFTs 09/23/2020   Hypokalemia 09/23/2020   Nausea & vomiting 09/23/2020   Alcohol abuse with withdrawal (HCC) 08/09/2019   DDD (degenerative disc disease), cervical 03/20/2018   Hypertension 03/20/2018   Sleep apnea 03/20/2018   Diverticulitis of large intestine with perforation without abscess or bleeding 03/15/2018   Gastroesophageal reflux disease without esophagitis 04/03/2017   History of chest pain 04/03/2017   Other hyperlipidemia 04/03/2017   Precordial pain    Tobacco abuse 08/21/2015   Anxiety state 02/15/2015    Past Surgical History: Past Surgical History:  Procedure Laterality Date   APPENDECTOMY     CARDIAC CATHETERIZATION Left 06/17/2016   Procedure: Left Heart Cath and Coronary Angiography;  Surgeon: Iran Ouch, MD;  Location: [facility] INVASIVE CV LAB;  Service: Cardiovascular;  Laterality: Left;   COLONOSCOPY     COLONOSCOPY WITH PROPOFOL N/A 09/07/2015   Procedure: COLONOSCOPY WITH PROPOFOL;  Surgeon: Wallace Cullens, MD;  Location: [facility] ENDOSCOPY;  Service: Gastroenterology;  Laterality: N/A;   COLONOSCOPY WITH PROPOFOL N/A 05/12/2018  Procedure: COLONOSCOPY WITH PROPOFOL;  Surgeon: Wyline Mood, MD;  Location: [facility] ENDOSCOPY;  Service: Gastroenterology;  Laterality: N/A;   COLONOSCOPY WITH PROPOFOL N/A 02/26/2022   Procedure: COLONOSCOPY WITH PROPOFOL;  Surgeon: Napoleon Form, MD;  Location: [facility] ENDOSCOPY;  Service: Gastroenterology;  Laterality: N/A;   ESOPHAGOGASTRODUODENOSCOPY (EGD) WITH PROPOFOL N/A 11/16/2021   Procedure: ESOPHAGOGASTRODUODENOSCOPY (EGD) WITH PROPOFOL;  Surgeon: Jaynie Collins, DO;  Location: [facility] ENDOSCOPY;  Service: Gastroenterology;  Laterality: N/A;   ESOPHAGOGASTRODUODENOSCOPY (EGD) WITH PROPOFOL N/A 02/26/2022   Procedure: ESOPHAGOGASTRODUODENOSCOPY (EGD) WITH PROPOFOL;  Surgeon: Napoleon Form, MD;  Location: [facility] ENDOSCOPY;  Service: Gastroenterology;  Laterality: N/A;   HERNIA REPAIR     IR RADIOLOGIST EVAL & MGMT   02/01/2021   POLYPECTOMY  02/26/2022   Procedure: POLYPECTOMY;  Surgeon: Napoleon Form, MD;  Location: [facility] ENDOSCOPY;  Service: Gastroenterology;;   VENTRAL HERNIA REPAIR N/A 01/17/2021   Procedure: HERNIA REPAIR VENTRAL ADULT;  Surgeon: Leafy Ro, MD;  Location: [facility] ORS;  Service: General;  Laterality: N/A;    Allergies: Allergies  Allergen Reactions   Chantix [Varenicline]    Lexapro [Escitalopram]          Home Medications: Medications Prior to Admission  Medication Sig Dispense Refill Last Dose   azithromycin (ZITHROMAX Z-PAK) 250 MG tablet 2 tablet day 1, then 1 tablet daily 6 each 0    FEROSUL 325 (65 Fe) MG tablet Take 325 mg by mouth every morning.      folic acid (FOLVITE) 1 MG tablet Take 1 mg by mouth daily.      furosemide (LASIX) 40 MG tablet Take 40 mg by mouth daily.      hydrOXYzine (ATARAX) 25 MG tablet Take 1 tablet (25 mg total) by mouth 3 (three) times daily as needed for anxiety. 30 tablet 0    lactulose (CHRONULAC) 10 GM/15ML solution Take 15 mLs (10 g total) by mouth 2 (two) times daily. 236 mL 0    montelukast (SINGULAIR) 10 MG tablet Take 10 mg by mouth daily.      Multiple Vitamin (MULTIVITAMIN WITH MINERALS) TABS tablet Take 1 tablet by mouth daily.      nadolol (CORGARD) 20 MG tablet Take 1 tablet (20 mg total) by mouth daily. 30 tablet 1    nicotine (NICODERM CQ - DOSED IN MG/24 HOURS) 21 mg/24hr patch Place 1 patch (21 mg total) onto the skin daily. 28 patch 0    pantoprazole (PROTONIX) 40 MG tablet Take 1 tablet (40 mg total) by mouth 2 (two) times daily. 60 tablet 1    QUEtiapine (SEROQUEL) 25 MG tablet Take 25 mg by mouth at bedtime.      spironolactone (ALDACTONE) 100 MG tablet Take 100 mg by mouth daily.      thiamine 100 MG tablet Take 100 mg by mouth daily.      Home medication reconciliation was completed with the patient.   Scheduled Inpatient Medications:    chlordiazePOXIDE  5 mg Oral QID   Chlorhexidine Gluconate Cloth  6 each  Topical Daily   folic acid  1 mg Oral Daily   lactulose  10 g Oral Daily   multivitamin with minerals  1 tablet Oral Daily   nadolol  20 mg Oral Daily   nicotine  14 mg Transdermal Daily   QUEtiapine  25 mg Oral QHS   sodium chloride flush  3 mL Intravenous Q12H   spironolactone  50 mg Oral BID   thiamine  100 mg  Oral Daily   Or   thiamine  100 mg Intravenous Daily    Continuous Inpatient Infusions:    pantoprazole 8 mg/hr (05/09/23 0600)    PRN Inpatient Medications:  acetaminophen **OR** acetaminophen, LORazepam **OR** LORazepam  Family History: family history includes Heart attack in his father; Heart disease in his father; Hypertension in his father.  The patient's family history is negative for inflammatory bowel disorders, GI malignancy, or solid organ transplantation.  Social History:   reports that he has been smoking cigarettes. He has a 22.5 pack-year smoking history. He has never used smokeless tobacco. He reports current alcohol use of about 2.0 standard drinks of alcohol per week. He reports that he does not use drugs. The patient denies ETOH, tobacco, or drug use.   Review of Systems: Constitutional: + weight gain  Eyes: No changes in vision. ENT: No oral lesions, sore throat.  GI: see HPI.  Heme/Lymph: + easy bruising, + bleeding  CV: No chest pain.  GU: No hematuria.  Integumentary: No rashes.  Neuro: No headaches.  Psych: No depression/anxiety.  Endocrine: No heat/cold intolerance.  Allergic/Immunologic: No urticaria.  Resp: No cough, SOB.  Musculoskeletal: No joint swelling.    Physical Examination: BP 127/71   Pulse 81   Temp 98.1 F (36.7 C) (Oral)   Resp 20   Ht 5\' 11"  (1.803 m)   Wt 110.5 kg   SpO2 96%   BMI 33.98 kg/m  Gen: NAD, alert and oriented x 4 HEENT: PEERLA, EOMI, anicteric  Neck: supple, no JVD or thyromegaly Chest: CTA bilaterally, no wheezes, crackles, or other adventitious sounds CV: RRR, no m/g/c/r Abd: soft, mildly  distended obese abdomen, +BS in all four quadrants; nontender to deep palpation in all four quadrants, no HSM, guarding, ridigity, or rebound tenderness, no fluid wave  Ext: no edema, well perfused with 2+ pulses, Skin: no rash or lesions noted Lymph: no LAD  Data: Lab Results  Component Value Date   WBC 5.3 05/09/2023   HGB 13.7 05/09/2023   HCT 37.6 (L) 05/09/2023   MCV 94.7 05/09/2023   PLT 67 (L) 05/09/2023   Recent Labs  Lab 05/08/23 1806 05/08/23 2147 05/09/23 0433  HGB 14.1 14.5 13.7   Lab Results  Component Value Date   NA 137 05/09/2023   K 3.6 05/09/2023   CL 104 05/09/2023   CO2 24 05/09/2023   BUN 7 05/09/2023   CREATININE 0.59 (L) 05/09/2023   Lab Results  Component Value Date   ALT 118 (H) 05/09/2023   AST 221 (H) 05/09/2023   ALKPHOS 144 (H) 05/09/2023   BILITOT 3.8 (H) 05/09/2023   Recent Labs  Lab 05/08/23 2147  INR 1.2   RUQ [place] 05/09/2023: FINDINGS: Gallbladder:   Low-density and thick walled gallbladder without stone. The wall measures up to 1 cm in thickness. No focal tenderness.   Common bile duct:   Diameter: 4 mm   Liver:   There is very limited assessment given the degree of echogenicity and poor acoustic penetration. History of cirrhosis with nodular surface. Portal vein is patent on color Doppler imaging with normal direction of blood flow towards the liver.   Other: Trace perihepatic fluid.   IMPRESSION: 1. Thickened gallbladder without tenderness or stone, a reactive pattern. 2. Poor visualization of the liver due to hepatocellular disease.   Assessment/Plan:  52 y/o Caucasian male with a PMH of HTN, HLD, obesity, GERD, anxiety, DDD, tobacco abuse, hx of skin cancer, and alcoholic cirrhosis of  the liver c/b ascites and EV presented to the [facility] ED 8/1 for chief complaint of wanting medically managed EtOH detox and hematochezia x 1 month.   Alcoholic cirrhosis of the liver c/b ascites and EV - MELD 3.0 15, Maddrey DF 16.2  suggests no indication for glucocorticoids at this time.   Hematochezia/Melena - no overt GIB at this time, H&H stable. DDx includes anal outlet etiology from internal hemorrhoids, varices, esophagitis, PUD, gastritis, AVMs, Dieulafoy's lesion, GAVE, polyp, neoplasm, etc  Grade II esophageal varices - seen on last EGD 02/2022, no hx of variceal bleed  Abdominal distention - c/w ascites   Portal hypertensive gastropathy w/ thrombocytopenia, splenomegaly, and hypoalbuminemia   EtOH abuse, recent relapse - high risk of withdrawal   Elevated LFTs - likely 2/2 recent relapse and daily EtOH abuse   Tobacco abuse   Recommendations:  - Maintain 2 large bore IVs for access - Orders placed for octreotide 50 mcg bolus and 50 mcg gtt and Ceftriaxone 1 gram IV daily given evidence of cirrhosis with concerns for bleeding - Continue Protonix gtt for gastric protection - H&H stable currently with no evidence of overt GI bleeding or significant hemodynamic instability - Continue to monitor for signs of GI bleeding  - Continue diuretics with Lasix 20 mg daily and Spironolactone 50 mg daily - Continue Lactulose 30 mL 1-2 times daily and titrate to 2-4 soft bowel movements daily - Continue Nadolol 20 mg daily  - Advise EGD this afternoon with Dr. Norma Fredrickson for luminal evaluation to rule out upper GI bleed +/- variceal banding +/- biopsies +/- endoscopic hemostasis - Avoid NSAIDs. Avoid hepatotoxins.  - Continue supportive care per primary care team - Continue to monitor and trend LFTs - CIWA protocol - High dose thiamine  - Reviewed importance of complete alcohol cessation. He would benefit from AA and substance abuse resources  - GI following along with you - NPO for now  I reviewed the risks (including bleeding, perforation, infection, anesthesia complications, cardiac/respiratory complications), benefits and alternatives of EGD. Patient consents to proceed.    Thank you for the consult. Please call  with questions or concerns.  Gilda Crease, PA-C [facility]astro[facility] [phone]

## 2023-05-09 NOTE — Assessment & Plan Note (Addendum)
We will admit with CIWA/ ETOH withdrawal protocol.  Thiamine 100 mg. Monitor closely for withdrawal. Start librium scheduled For 48 hours.  Aspiration/ seizure and withdrawal precaution.

## 2023-05-09 NOTE — Progress Notes (Signed)
Pt off the floor for EGD.

## 2023-05-09 NOTE — Discharge Instructions (Signed)

## 2023-05-09 NOTE — Assessment & Plan Note (Signed)
Vitals:   05/08/23 1600 05/08/23 1700 05/08/23 1730 05/08/23 1800  BP: (!) 138/96 117/84 112/79 132/80   05/08/23 1830 05/08/23 1900 05/08/23 2000 05/08/23 2100  BP: 137/82 (!) 145/90 (!) 145/91 (!) 155/98   05/08/23 2136 05/08/23 2200 05/08/23 2300 05/09/23 0000  BP: (!) 148/91 (!) 162/88 135/82 139/86  We will continue corgard and aldactone.

## 2023-05-09 NOTE — Assessment & Plan Note (Addendum)
High risk for variceal bleed.cont nadolol and aldactone at 50 mg bid.   Thrombocytopenia stable and h/h stable single dose of heparin for DVT prophylaxis given Usg ordered and pending.

## 2023-05-09 NOTE — Progress Notes (Addendum)
  Progress Note   Patient: Omar Turner NUU:[id number] DOB: [birth year] DOA: 05/08/2023     0 DOS: the patient was seen and examined on 05/09/2023   Brief hospital course: From H&P notes.   52 y.o. male with medical history significant for cirrhosis secondary to alcohol abuse with portal hypertension, ascites, alcoholic hepatitis, GERD, ventral hernia repair,  wanting to detox from his alcohol patient . He presented with complaints of bloody stools with bright red blood intermittently for the past 1 month, history of seizures and delirium with history of alcohol withdrawal and his last drink was 2 shots of vodka at 10 AM.  Patient had recently relapsed with alcohol use disorder after being sober for over a year.  Assessment and Plan: Alcohol abuse with withdrawal (HCC) He remains stable with CIWA withdrawal protocol.  Continue with Thiamine 100 mg and librium scheduled For 48 hours.  Aspiration/ seizure and withdrawal precaution.   BRBPR On octreotide. GI consult appreciated.  S/p EGD today with portal hypertensive gastropathy. - Grade II esophageal varices. Completely eradicated. Banded. Repeat h/h is stable. Continue to monitor H/H Hold anticoagulation due to GI bleeding  Acute on chronic back pain: morphine PRN  Alcoholic cirrhosis of liver with ascites (HCC) Compensated. AFP normal. Hepatitis non- reactive Lft elevated c/w Alcohol liver disease  Portal hypertensive gastropathy (HCC) High risk for variceal bleed.Cont nadolol and aldactone at 50 mg bid.   Thrombocytopenia stable and h/h stable   Gastroesophageal reflux disease without esophagitis IV PPI gtt.   Alcohol use disorder: ETOH cessation counselling again offered. Patient is aware he needs to quit. He recently relapsed and will need help upon discharge   Hypertension: BP stable  Tobacco abuse Nicotine patch.   Ascites Compensated.  Mild elevated ammonia level.  Lactulose daily or every other day.   Subjective:  Patient feels well No events reported obvernight Had a BM today with no blood witnessed Denies nausea or vomiting No evidence of acute withdrawal  Physical Exam: Vitals:   05/09/23 1300 05/09/23 1357 05/09/23 1427 05/09/23 1511  BP:  (!) 153/96  (!) 149/92  Pulse:      Resp: (!) 22     Temp:  97.6 F (36.4 C)  (!) 97.5 F (36.4 C)  TempSrc:  Temporal  Axillary  SpO2:   98%   Weight:      Height:       Patient looks well without any distress. HEENT:Head atraumatic. Neck supple. No JVD CHEST: Clinically diminished bilaterally. Abdomen: Soft , NT CVS: S1,S2 wnm or gallops CNS: No focal deficits Skin: Negative for rash  Data Reviewed:  Thickened gallbladder without tenderness or stone, a reactive pattern. 2. Poor visualization of the liver due to hepatocellular disease.    Family Communication: No family at bedside at this time  Disposition: Status is: Observation The patient will require care spanning > 2 midnights and should be moved to inpatient because: ETOH withdrawal and GI blood loss  Planned Discharge Destination: Home  Time spent:35 minutes  Author: Lilia Pro, MD 05/09/2023 3:19 PM  For on call review [email or web address].

## 2023-05-10 DIAGNOSIS — F10139 Alcohol abuse with withdrawal, unspecified: Secondary | ICD-10-CM | POA: Diagnosis not present

## 2023-05-10 MED ORDER — ONDANSETRON HCL 4 MG/2ML IJ SOLN
4.0000 mg | Freq: Four times a day (QID) | INTRAMUSCULAR | Status: DC | PRN
  Administered 2023-05-10 (×2): 4 mg via INTRAVENOUS
  Filled 2023-05-10 (×2): qty 2

## 2023-05-10 MED ORDER — FUROSEMIDE 20 MG PO TABS
20.0000 mg | ORAL_TABLET | Freq: Once | ORAL | Status: AC
  Administered 2023-05-10: 20 mg via ORAL
  Filled 2023-05-10: qty 1

## 2023-05-10 MED ORDER — THIAMINE HCL 100 MG/ML IJ SOLN
250.0000 mg | INTRAVENOUS | Status: DC
  Filled 2023-05-10: qty 2.5

## 2023-05-10 MED ORDER — SPIRONOLACTONE 25 MG PO TABS
100.0000 mg | ORAL_TABLET | Freq: Every day | ORAL | Status: DC
  Administered 2023-05-11: 100 mg via ORAL
  Filled 2023-05-10: qty 4

## 2023-05-10 MED ORDER — THIAMINE HCL 100 MG/ML IJ SOLN
500.0000 mg | Freq: Three times a day (TID) | INTRAVENOUS | Status: AC
  Administered 2023-05-10 – 2023-05-11 (×2): 500 mg via INTRAVENOUS
  Filled 2023-05-10 (×2): qty 5

## 2023-05-10 MED ORDER — THIAMINE HCL 100 MG/ML IJ SOLN: 100.0000 mg | INTRAMUSCULAR | Status: DC

## 2023-05-10 MED ORDER — SPIRONOLACTONE 25 MG PO TABS
50.0000 mg | ORAL_TABLET | Freq: Once | ORAL | Status: AC
  Administered 2023-05-10: 50 mg via ORAL
  Filled 2023-05-10: qty 2

## 2023-05-10 MED ORDER — FUROSEMIDE 40 MG PO TABS
40.0000 mg | ORAL_TABLET | Freq: Every day | ORAL | Status: DC
  Administered 2023-05-11: 40 mg via ORAL
  Filled 2023-05-10: qty 1

## 2023-05-10 NOTE — Plan of Care (Signed)

## 2023-05-10 NOTE — Progress Notes (Signed)
Progress Note   Patient: Omar Turner ZOX:[id number] DOB: 05/30/1971 DOA: 05/08/2023     1 DOS: the patient was seen and examined on 05/10/2023   Brief hospital course: From H&P notes.   52 y.o. male with medical history significant for cirrhosis secondary to alcohol abuse with portal hypertension, ascites, alcoholic hepatitis, GERD, ventral hernia repair,  wanting to detox from his alcohol patient . He presented with complaints of bloody stools with bright red blood intermittently for the past 1 month, history of seizures and delirium with history of alcohol withdrawal and his last drink was 2 shots of vodka at 10 AM.  Patient had recently relapsed with alcohol use disorder after being sober for over a year.  8/3 : Patient improvement in status.  CIWA continues to be below 8.  Requiring p.o. Ativan on a reduced frequency.  Seen by GI.  Patient's Lasix was increased to 40 mg daily and spironolactone 200 mg daily.  Nodolor 20 mg daily with up titration for a target heart rate of 55-60.  No Ascites  fluid as per critical care bedside evaluation with ultrasound.  Assessment and Plan:  Alcohol abuse with withdrawal   He remains stable with CIWA withdrawal protocol.  Continue with Thiamine 100 mg and librium scheduled For 48 hours.  Aspiration/ seizure and withdrawal precaution.   BRBPR  On octreotide d/c ed on 3/8  GI consult appreciated.  S/p EGD today with portal hypertensive gastropathy. - Grade II esophageal varices. Completely eradicated. Banded. Repeat h/h is stable. Continue to monitor H/H Hold anticoagulation due to GI bleeding  Acute on chronic back pain: morphine PRN  Alcoholic cirrhosis of liver with ascites  Compensated. AFP normal. Hepatitis non- reactive Lft elevated c/w Alcohol liver disease Patient Lasix and spironolactone increased.  nodalol was continued  Portal hypertensive gastropathy (HCC) High risk for variceal bleed.Cont nadolol and aldactone at 50 mg bid.    Thrombocytopenia stable and h/h stable   Gastroesophageal reflux disease without esophagitis IV PPI gtt.   Alcohol use disorder: ETOH cessation counselling again offered. Patient is aware he needs to quit. He recently relapsed and will need help upon discharge   Hypertension: BP stable  Tobacco abuse Nicotine patch.   Ascites Compensated.  Mild elevated ammonia level.  Lactulose daily or every other day.   Subjective: Patient feels well No events reported obvernight Had a BM today with no blood witnessed Denies nausea or vomiting No evidence of acute withdrawal  Physical Exam: Vitals:   05/10/23 1000 05/10/23 1100 05/10/23 1200 05/10/23 1202  BP: (!) 144/93 (!) 131/96 (!) 132/95 (!) 132/95  Pulse: 73 67 76   Resp: (!) 21 (!) 23  (!) 29  Temp:      TempSrc:      SpO2: 96% 93%    Weight:      Height:       Patient looks well without any distress. HEENT:Head atraumatic. Neck supple. No JVD CHEST: Clinically diminished bilaterally. Abdomen: Obese soft , NT CVS: S1,S2 wnm or gallops CNS: No focal deficits Skin: Negative for rash  Data Reviewed:  Thickened gallbladder without tenderness or stone, a reactive pattern. 2. Poor visualization of the liver due to hepatocellular disease.    Family Communication: No family at bedside at this time  Disposition: Status is: Observation The patient will require care spanning > 2 midnights and should be moved to inpatient because: ETOH withdrawal and GI blood loss  Planned Discharge Destination: Home  Time spent:35 minutes  Author: Kirstie Peri, MD 05/10/2023 12:10 PM  For on call review [email or web address].

## 2023-05-10 NOTE — Progress Notes (Signed)
GI Inpatient Follow-up Note  Subjective:  Patient seen in follow-up for alcohol detox and hematochezia s/p EGD yesterday which showed moderate portal hypertensive gastropathy in entire examined stomach and Grade II esophageal varices without bleeding s/p banding x2. No active bleeding seen during procedure. No acute events overnight. He has questions about his endoscopy report and wants to go over it this morning. His main complaint is abdominal distention. He denies any fevers, chills, altered mental status, nausea, vomiting, hematochezia, or melena. Hemoglobin remains within normal limits. No further episodes of hematochezia. He is tolerating 2-gram sodium diet. No new complaints.   Scheduled Inpatient Medications:   chlordiazePOXIDE  5 mg Oral QID   Chlorhexidine Gluconate Cloth  6 each Topical Daily   folic acid  1 mg Oral Daily   furosemide  20 mg Oral Daily   multivitamin with minerals  1 tablet Oral Daily   nadolol  20 mg Oral Daily   nicotine  14 mg Transdermal Daily   sodium chloride flush  3 mL Intravenous Q12H   spironolactone  50 mg Oral Daily   [START ON 05/16/2023] thiamine (VITAMIN B1) injection  100 mg Intravenous Q24H    Continuous Inpatient Infusions:    cefTRIAXone (ROCEPHIN)  IV 2 g (05/10/23 0915)   octreotide (SANDOSTATIN) 500 mcg in sodium chloride 0.9 % 250 mL (2 mcg/mL) infusion 50 mcg/hr (05/10/23 0600)   pantoprazole 8 mg/hr (05/10/23 0600)   thiamine (VITAMIN B1) injection Stopped (05/10/23 0558)   Followed by   Melene Muller ON 05/11/2023] thiamine (VITAMIN B1) injection      PRN Inpatient Medications:  acetaminophen **OR** acetaminophen, LORazepam **OR** LORazepam, morphine injection  Review of Systems: Constitutional: + weight gain  Eyes: No changes in vision. ENT: No oral lesions, sore throat.  GI: see HPI.  Heme/Lymph: No easy bruising.  CV: No chest pain.  GU: No hematuria.  Integumentary: No rashes.  Neuro: No headaches.  Psych: No  depression/anxiety.  Endocrine: No heat/cold intolerance.  Allergic/Immunologic: No urticaria.  Resp: No cough, SOB.  Musculoskeletal: No joint swelling.    Physical Examination: BP (!) 126/96   Pulse 79   Temp (!) 96.5 F (35.8 C) (Axillary)   Resp 16   Ht 5\' 11"  (1.803 m)   Wt 113 kg   SpO2 (!) 86%   BMI 34.75 kg/m  Gen: NAD, alert and oriented x 4 HEENT: PEERLA, EOMI, Neck: supple, no JVD or thyromegaly Chest: CTA bilaterally, no wheezes, crackles, or other adventitious sounds CV: RRR, no m/g/c/r Abd: soft, NT, ND, +BS in all four quadrants; no HSM, guarding, ridigity, or rebound tenderness Ext: no edema, well perfused with 2+ pulses, Skin: no rash or lesions noted Lymph: no LAD  Data: Lab Results  Component Value Date   WBC 5.1 05/10/2023   HGB 13.5 05/10/2023   HCT 39.2 05/10/2023   MCV 99.2 05/10/2023   PLT 58 (L) 05/10/2023   Recent Labs  Lab 05/08/23 2147 05/09/23 0433 05/10/23 0338  HGB 14.5 13.7 13.5   Lab Results  Component Value Date   NA 135 05/10/2023   K 4.4 05/10/2023   CL 104 05/10/2023   CO2 24 05/10/2023   BUN 8 05/10/2023   CREATININE 0.61 05/10/2023   Lab Results  Component Value Date   ALT 101 (H) 05/10/2023   AST 169 (H) 05/10/2023   ALKPHOS 144 (H) 05/10/2023   BILITOT 4.2 (H) 05/10/2023   Recent Labs  Lab 05/08/23 2147  INR 1.2   EGD  05/09/2023: Findings:      The examined duodenum was normal.      Moderate portal hypertensive gastropathy was found in the entire       examined stomach.      There is no endoscopic evidence of bleeding, ulceration or varices in       the entire examined stomach.      Grade II varices were found in the middle third of the esophagus and in       the lower third of the esophagus. Two bands were successfully placed       with complete eradication, resulting in deflation of varices. There was       no bleeding during and at the end of the procedure. Estimated blood       loss: none.      The  exam was otherwise without abnormality. Impression:             - Normal examined duodenum. - Portal hypertensive gastropathy. - Grade II esophageal varices. Completely eradicated. Banded. - The examination was otherwise normal. - No specimens collected.  Assessment/Plan:  52 y/o Caucasian male with a PMH of HTN, HLD, obesity, GERD, anxiety, DDD, tobacco abuse, hx of skin cancer, and alcoholic cirrhosis of the liver c/b ascites and EV presented to the [facility] ED 8/1 for chief complaint of wanting medically managed EtOH detox and hematochezia x 1 month. He is s/p EGD yesterday which showed moderate PHG and non-bleeding Grade II EV s/p 2 bands placed. GI following.    Alcoholic cirrhosis of the liver c/b ascites and EV - MELD 3.0 15, Maddrey DF 16.2 suggests no indication for glucocorticoids at this time.   Grade II EV s/p banding x2   Hematochezia/Melena - no overt GIB at this time, H&H stable.    Moderate portal hypertensive gastropathy    Abdominal distention - c/w ascites   EtOH abuse, recent relapse - high risk of withdrawal  Elevated LFTs - mild alcoholic hepatitis, no indication for steroids   Tobacco abuse   Recommendations:   - Maintain 2 large bore IVs for access - OK to d/c Octreotide, no longer indicated  - Continue Ceftriaxone 1 gram IV daily for now  - Continue Protonix gtt for gastric protection - H&H stable currently with no evidence of overt GI bleeding or significant hemodynamic instability - Continue to monitor for signs of GI bleeding  - Increase diuretics to Lasix 40 mg daily and  Spironolactone 100 mg daily - Strict 2-gram sodium diet  - Consider diagnostic/therapeutic LVP - Continue Lactulose 30 mL 1-2 times daily and titrate to 2-4 soft bowel movements daily - Continue Nadolol 20 mg daily, titrate to goal resting HR 55-60 bpm - Avoid NSAIDs. Avoid hepatotoxins.  - Continue supportive care per primary care team - Continue to monitor and trend LFTs - CIWA  protocol - Reviewed importance of complete alcohol cessation. He would benefit from AA and substance abuse resources  - Will need repeat EGD in ~3 weeks for repeat banding/confirm deflation varices - GI following along with you   Please call with questions or concerns.   Jacob Moores, PA-C [facility] [phone]

## 2023-05-10 NOTE — Progress Notes (Addendum)
Brief Progress Note:  I was asked by our GI colleagues to assess Mr. Trumbull for diagnostic and therapeutic paracentesis. Patient admitted for alcohol withdrawal and concern for GI bleed. Bedside ultrasound performed and no ascitic fluid noted in multiple fields. No indication for paracentesis given absence of fluid.         Raechel Chute, MD [facility] 05/10/2023 11:30 AM

## 2023-05-11 DIAGNOSIS — F10139 Alcohol abuse with withdrawal, unspecified: Secondary | ICD-10-CM

## 2023-05-11 DIAGNOSIS — K921 Melena: Secondary | ICD-10-CM | POA: Diagnosis not present

## 2023-05-11 LAB — COMPREHENSIVE METABOLIC PANEL WITH GFR
ALT: 92 U/L — ABNORMAL HIGH (ref 0–44)
AST: 141 U/L — ABNORMAL HIGH (ref 15–41)
Albumin: 2.8 g/dL — ABNORMAL LOW (ref 3.5–5.0)
Alkaline Phosphatase: 137 U/L — ABNORMAL HIGH (ref 38–126)
Anion gap: 8 (ref 5–15)
BUN: 8 mg/dL (ref 6–20)
CO2: 23 mmol/L (ref 22–32)
Calcium: 8.1 mg/dL — ABNORMAL LOW (ref 8.9–10.3)
Chloride: 101 mmol/L (ref 98–111)
Creatinine, Ser: 0.58 mg/dL — ABNORMAL LOW (ref 0.61–1.24)
GFR, Estimated: >60 mL/min (ref >60)
Glucose, Bld: 101 mg/dL — ABNORMAL HIGH (ref 70–99)
Potassium: 3.8 mmol/L (ref 3.5–5.1)
Sodium: 132 mmol/L — ABNORMAL LOW (ref 135–145)
Total Bilirubin: 3.6 mg/dL — ABNORMAL HIGH (ref 0.3–1.2)
Total Protein: 6.3 g/dL — ABNORMAL LOW (ref 6.5–8.1)

## 2023-05-11 LAB — CBC
HCT: 39.2 % (ref 39.0–52.0)
Hemoglobin: 13.7 g/dL (ref 13.0–17.0)
MCH: 34.6 pg — ABNORMAL HIGH (ref 26.0–34.0)
MCHC: 34.9 g/dL (ref 30.0–36.0)
MCV: 99 fL (ref 80.0–100.0)
Platelets: 66 10*3/uL — ABNORMAL LOW (ref 150–400)
RBC: 3.96 MIL/uL — ABNORMAL LOW (ref 4.22–5.81)
RDW: 14.3 % (ref 11.5–15.5)
WBC: 6.8 10*3/uL (ref 4.0–10.5)
nRBC: 0 % (ref 0.0–0.2)

## 2023-05-11 LAB — MAGNESIUM: Magnesium: 2 mg/dL (ref 1.7–2.4)

## 2023-05-11 MED ORDER — FOLIC ACID 1 MG PO TABS: 1.0000 mg | ORAL_TABLET | Freq: Every day | ORAL | 0 refills | Status: DC

## 2023-05-11 MED ORDER — PANTOPRAZOLE SODIUM 40 MG PO TBEC: 40.0000 mg | DELAYED_RELEASE_TABLET | Freq: Every day | ORAL | 0 refills | Status: DC

## 2023-05-11 MED ORDER — NADOLOL 40 MG PO TABS: 40.0000 mg | ORAL_TABLET | Freq: Every day | ORAL | 0 refills | Status: DC

## 2023-05-11 MED ORDER — FUROSEMIDE 20 MG PO TABS: 20.0000 mg | ORAL_TABLET | Freq: Every day | ORAL | 0 refills | Status: DC

## 2023-05-11 MED ORDER — SPIRONOLACTONE 100 MG PO TABS: 100.0000 mg | ORAL_TABLET | Freq: Every day | ORAL | 0 refills | Status: DC

## 2023-05-11 MED ORDER — HYDROXYZINE HCL 25 MG PO TABS: 25.0000 mg | ORAL_TABLET | Freq: Three times a day (TID) | ORAL | Status: DC | PRN

## 2023-05-11 MED ORDER — FERROUS SULFATE 324 MG PO TBEC: 324.0000 mg | DELAYED_RELEASE_TABLET | ORAL | 0 refills | Status: DC

## 2023-05-11 NOTE — Discharge Summary (Signed)
Physician Discharge Summary   Patient: Omar Turner MRN: [id number] DOB: November 04, 1970  Admit date:     05/08/2023  Discharge date: 05/11/23  Discharge Physician: Lurene Shadow   PCP: Lynnea Ferrier, MD   Recommendations at discharge:    Follow up with PCP in 1 to 2 weeks  Discharge Diagnoses: Principal Problem:   Alcohol abuse with withdrawal (HCC) Active Problems:   Alcoholic cirrhosis of liver with ascites (HCC)   Bright red rectal bleeding   Portal hypertensive gastropathy (HCC)   Gastroesophageal reflux disease without esophagitis   Hypertension   Tobacco abuse   Alcohol abuse   GI bleed  Resolved Problems:   * No resolved hospital problems. *  Hospital Course:  52 y.o. male with medical history significant for cirrhosis secondary to alcohol abuse with portal hypertension, ascites, alcoholic hepatitis, GERD, ventral hernia repair,  wanting to detox from his alcohol patient . He presented with complaints of bloody stools with bright red blood and dark tarry stools intermittently for the past 1 month, history of seizures and delirium with history of alcohol withdrawal. Patient had recently relapsed with alcohol use disorder after being sober for over a year.  He was admitted to the hospital for acute GI bleeding (hematochezia and melena).  He was treated with IV fluids, IV octreotide infusion and IV Protonix infusion.  He was also treated with empiric antibiotics (ceftriaxone) He was evaluated by the gastroenterologist.  He underwent EGD which showed portal hypertensive gastropathy and grade 2 esophageal varices that were completely eradicated and banded.  His condition has improved and is deemed stable for discharge to home today.         Consultants: Gastroenterologist Procedures performed: EGD Disposition: Home Diet recommendation:  Discharge Diet Orders (From admission, onward)     Start     Ordered   05/11/23 0000  Diet - low sodium heart healthy         05/11/23 1038           Cardiac diet DISCHARGE MEDICATION: Allergies as of 05/11/2023       Reactions   Chantix [varenicline]    Lexapro [escitalopram]            Medication List     STOP taking these medications    montelukast 10 MG tablet Commonly known as: SINGULAIR       TAKE these medications    ferrous sulfate 324 MG Tbec Take 1 tablet (324 mg total) by mouth every other day. What changed: when to take this   folic acid 1 MG tablet Commonly known as: FOLVITE Take 1 tablet (1 mg total) by mouth daily.   furosemide 20 MG tablet Commonly known as: LASIX Take 1 tablet (20 mg total) by mouth daily.   nadolol 40 MG tablet Commonly known as: CORGARD Take 1 tablet (40 mg total) by mouth daily.   pantoprazole 40 MG tablet Commonly known as: PROTONIX Take 1 tablet (40 mg total) by mouth daily.   spironolactone 100 MG tablet Commonly known as: ALDACTONE Take 1 tablet (100 mg total) by mouth daily. What changed:  medication strength how much to take        Follow-up Information     [place], Boykin Nearing, MD. Schedule an appointment as soon as possible for a visit in 2 week(s).   Specialty: Gastroenterology Contact information: [address] [place] [address] [phone]  Discharge Exam: Filed Weights   05/09/23 0434 05/10/23 0400 05/11/23 0500  Weight: 110.5 kg 113 kg 110.1 kg   GEN: NAD SKIN: Warm and dry EYES: No pallor or icterus ENT: MMM CV: RRR PULM: CTA B ABD: soft, obese, NT, +BS CNS: AAO x 3, non focal EXT: No edema or tenderness   Condition at discharge: good  The results of significant diagnostics from this hospitalization (including imaging, microbiology, ancillary and laboratory) are listed below for reference.   Imaging Studies: US ABDOMEN LIMITED RUQ (LIVER/GB)  Result Date: 05/09/2023 CLINICAL DATA:  Abnormal liver function tests.  Cirrhosis. EXAM: ULTRASOUND ABDOMEN LIMITED RIGHT UPPER  QUADRANT COMPARISON:  Abdominal MRI 02/23/2022 FINDINGS: Gallbladder: Low-density and thick walled gallbladder without stone. The wall measures up to 1 cm in thickness. No focal tenderness. Common bile duct: Diameter: 4 mm Liver: There is very limited assessment given the degree of echogenicity and poor acoustic penetration. History of cirrhosis with nodular surface. Portal vein is patent on color Doppler imaging with normal direction of blood flow towards the liver. Other: Trace perihepatic fluid. IMPRESSION: 1. Thickened gallbladder without tenderness or stone, a reactive pattern. 2. Poor visualization of the liver due to hepatocellular disease. Electronically Signed   By: Tiburcio Pea M.D.   On: 05/09/2023 04:17    Microbiology: Results for orders placed or performed during the hospital encounter of 05/08/23  MRSA Next Gen by PCR, Nasal     Status: None   Collection Time: 05/08/23  9:55 PM   Specimen: Nasal Mucosa; Nasal Swab  Result Value Ref Range Status   MRSA by PCR Next Gen NOT DETECTED NOT DETECTED Final    Comment: (NOTE) The GeneXpert MRSA Assay (FDA approved for NASAL specimens only), is one component of a comprehensive MRSA colonization surveillance program. It is not intended to diagnose MRSA infection nor to guide or monitor treatment for MRSA infections. Test performance is not FDA approved in patients less than 45 years old. Performed at [facility], [address] [place], [place] [address]     Labs: CBC: Recent Labs  Lab 05/08/23 1437 05/08/23 1806 05/08/23 2147 05/09/23 0433 05/10/23 0338 05/11/23 0708  WBC 8.4  --   --  5.3 5.1 6.8  NEUTROABS  --   --   --   --  2.6  --   HGB 15.1 14.1 14.5 13.7 13.5 13.7  HCT 42.7 39.8 40.6 37.6* 39.2 39.2  MCV 96.8  --   --  94.7 99.2 99.0  PLT 93*  --   --  67* 58* 66*   Basic Metabolic Panel: Recent Labs  Lab 05/08/23 1437 05/08/23 2147 05/09/23 0433 05/10/23 0338 05/11/23 0708  NA 136  --  137 135  132*  K 3.6  --  3.6 4.4 3.8  CL 103  --  104 104 101  CO2 22  --  24 24 23   GLUCOSE 136*  --  110* 116* 101*  BUN 6  --  7 8 8   CREATININE 0.69  --  0.59* 0.61 0.58*  CALCIUM 8.3*  --  8.2* 8.3* 8.1*  MG  --  2.0  --   --  2.0   Liver Function Tests: Recent Labs  Lab 05/08/23 1437 05/09/23 0433 05/10/23 0338 05/11/23 0708  AST 249* 221* 169* 141*  ALT 134* 118* 101* 92*  ALKPHOS 188* 144* 144* 137*  BILITOT 2.6* 3.8* 4.2* 3.6*  PROT 7.5 6.6 6.5 6.3*  ALBUMIN 3.3* 3.0* 2.9* 2.8*  CBG: Recent Labs  Lab 05/08/23 2136  GLUCAP 106*    Discharge time spent: greater than 30 minutes.  Signed: Lurene Shadow, MD [facility] 05/11/2023

## 2023-05-11 NOTE — Plan of Care (Signed)

## 2023-05-11 NOTE — Progress Notes (Signed)
GI Inpatient Follow-up Note  Subjective:  Patient seen in follow-up for alcohol detox and hematochezia s/p EGD 8/2 which showed moderate portal hypertensive gastropathy in entire examined stomach and Grade II esophageal varices without bleeding s/p banding x2. No acute events overnight. Bedside ultrasound yesterday showed no ascitic fluid. No further episodes of hematochezia. He is tolerating 2-gram sodium diet. No new complaints today. He does endorse some mild itching of head and face and mild back pain.    Scheduled Inpatient Medications:   Chlorhexidine Gluconate Cloth  6 each Topical Daily   folic acid  1 mg Oral Daily   furosemide  40 mg Oral Daily   multivitamin with minerals  1 tablet Oral Daily   nadolol  20 mg Oral Daily   nicotine  14 mg Transdermal Daily   sodium chloride flush  3 mL Intravenous Q12H   spironolactone  100 mg Oral Daily   [START ON 05/16/2023] thiamine (VITAMIN B1) injection  100 mg Intravenous Q24H    Continuous Inpatient Infusions:    cefTRIAXone (ROCEPHIN)  IV 2 g (05/10/23 0915)   pantoprazole 8 mg/hr (05/10/23 1453)   thiamine (VITAMIN B1) injection      PRN Inpatient Medications:  acetaminophen **OR** acetaminophen, LORazepam **OR** LORazepam, morphine injection, ondansetron (ZOFRAN) IV  Review of Systems: Constitutional: + weight gain  Eyes: No changes in vision. ENT: No oral lesions, sore throat.  GI: see HPI.  Heme/Lymph: No easy bruising.  CV: No chest pain.  GU: No hematuria.  Integumentary: No rashes.  Neuro: No headaches.  Psych: No depression/anxiety.  Endocrine: No heat/cold intolerance.  Allergic/Immunologic: No urticaria.  Resp: No cough, SOB.  Musculoskeletal: No joint swelling.    Physical Examination: BP 116/80   Pulse 77   Temp 97.7 F (36.5 C)   Resp 14   Ht 5\' 11"  (1.803 m)   Wt 110.1 kg   SpO2 97%   BMI 33.86 kg/m  Gen: NAD, alert and oriented x 4 HEENT: PEERLA, EOMI, Neck: supple, no JVD or  thyromegaly Chest: CTA bilaterally, no wheezes, crackles, or other adventitious sounds CV: RRR, no m/g/c/r Abd: soft, NT, ND, +BS in all four quadrants; no HSM, guarding, ridigity, or rebound tenderness Ext: no edema, well perfused with 2+ pulses, Skin: no rash or lesions noted Lymph: no LAD  Data: Lab Results  Component Value Date   WBC 6.8 05/11/2023   HGB 13.7 05/11/2023   HCT 39.2 05/11/2023   MCV 99.0 05/11/2023   PLT 66 (L) 05/11/2023   Recent Labs  Lab 05/09/23 0433 05/10/23 0338 05/11/23 0708  HGB 13.7 13.5 13.7   Lab Results  Component Value Date   NA 132 (L) 05/11/2023   K 3.8 05/11/2023   CL 101 05/11/2023   CO2 23 05/11/2023   BUN 8 05/11/2023   CREATININE 0.58 (L) 05/11/2023   Lab Results  Component Value Date   ALT 92 (H) 05/11/2023   AST 141 (H) 05/11/2023   ALKPHOS 137 (H) 05/11/2023   BILITOT 3.6 (H) 05/11/2023   Recent Labs  Lab 05/08/23 2147  INR 1.2   EGD 05/09/2023: Findings:      The examined duodenum was normal.      Moderate portal hypertensive gastropathy was found in the entire       examined stomach.      There is no endoscopic evidence of bleeding, ulceration or varices in       the entire examined stomach.  Grade II varices were found in the middle third of the esophagus and in       the lower third of the esophagus. Two bands were successfully placed       with complete eradication, resulting in deflation of varices. There was       no bleeding during and at the end of the procedure. Estimated blood       loss: none.      The exam was otherwise without abnormality. Impression:             - Normal examined duodenum. - Portal hypertensive gastropathy. - Grade II esophageal varices. Completely eradicated. Banded. - The examination was otherwise normal. - No specimens collected.  Assessment/Plan:  52 y/o Caucasian male with a PMH of HTN, HLD, obesity, GERD, anxiety, DDD, tobacco abuse, hx of skin cancer, and alcoholic  cirrhosis of the liver c/b ascites and EV presented to the [facility] ED 8/1 for chief complaint of wanting medically managed EtOH detox and hematochezia x 1 month. He is s/p EGD 8/2 which showed moderate PHG and non-bleeding Grade II EV s/p 2 bands placed. GI following.    Alcoholic cirrhosis of the liver c/b ascites and EV - MELD 3.0 15, Maddrey DF 16.2 suggests no indication for glucocorticoids at this time.   Grade II EV s/p banding x2   Hematochezia/Melena - no overt GIB at this time, H&H stable.    Moderate portal hypertensive gastropathy    Abdominal distention - c/w ascites   EtOH abuse, recent relapse - high risk of withdrawal  Elevated LFTs - mild alcoholic hepatitis, no indication for steroids   Tobacco abuse   Recommendations:   - Maintain 2 large bore IVs for access - Continue to monitor H&H closely. H&H stable currently with no evidence of overt GI bleeding or significant hemodynamic instability - Continue Ceftriaxone 1 gram IV daily for now  - D/C Protonix gtt and switch to Protonix 40 mg PO BID home dose  - Continue to monitor for signs of GI bleeding  - Continue diuretics with Lasix 40 mg daily and Spironolactone 100 mg daily - Strict 2-gram sodium diet  - Continue Nadolol 20 mg daily, titrate to goal resting HR 55-60 bpm - Avoid NSAIDs. Avoid hepatotoxins.  - Continue supportive care per primary care team - Continue to monitor and trend LFTs - CIWA protocol - Reviewed importance of complete alcohol cessation. He would benefit from AA and substance abuse resources  - Will need repeat EGD in ~2-4 weeks for repeat banding/confirm deflation varices - I will arrange follow-up with me in outpatient setting in 1-2 weeks - GI will sign off at this time   Please call with questions or concerns.   Jacob Moores, PA-C [facility] [phone]

## 2023-05-12 ENCOUNTER — Encounter: Payer: Self-pay | Admitting: Internal Medicine

## 2023-05-25 IMAGING — US US ABDOMEN LIMITED
1 series · 9 of 9 positions shown · non-contrast
Comparison: 02/23/2021

CLINICAL DATA: Abdominal distension

EXAM:
LIMITED ABDOMEN ULTRASOUND FOR ASCITES
TECHNIQUE: Limited ultrasound survey for ascites was performed in all four
abdominal quadrants.

[Series 1: us abdomen limited · 9 of 9 slices shown]
[im 1/9]
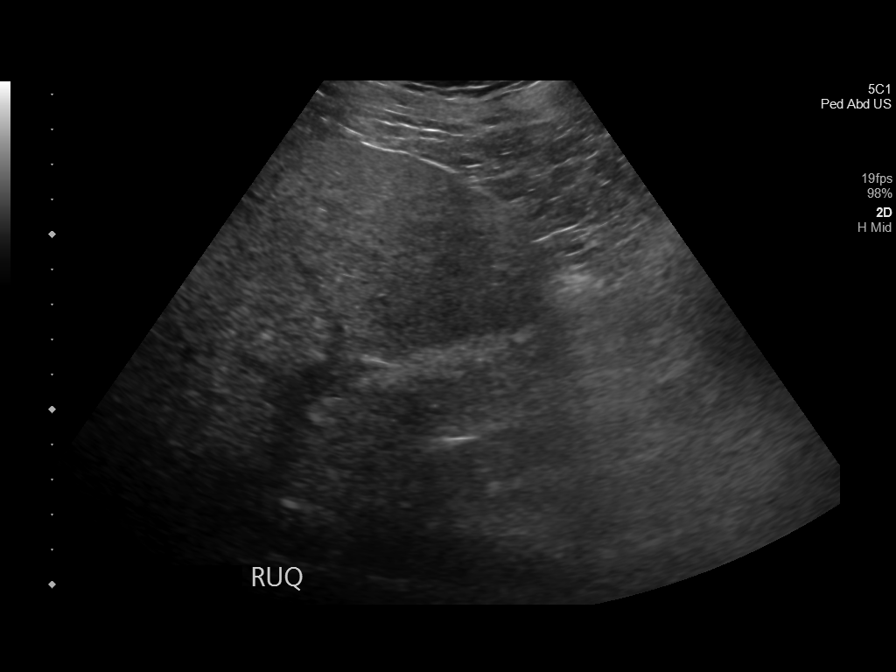
[im 2/9]
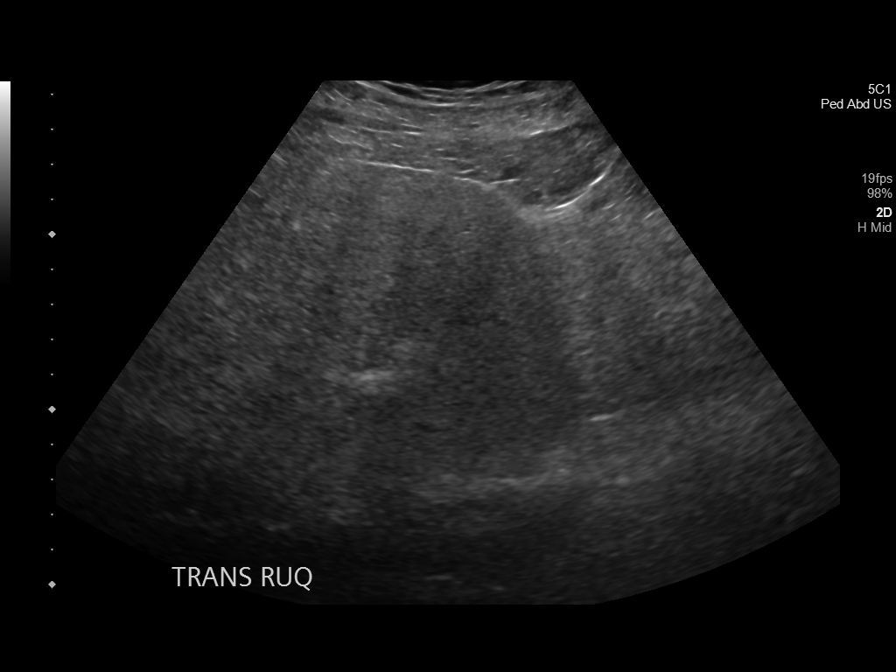
[im 3/9]
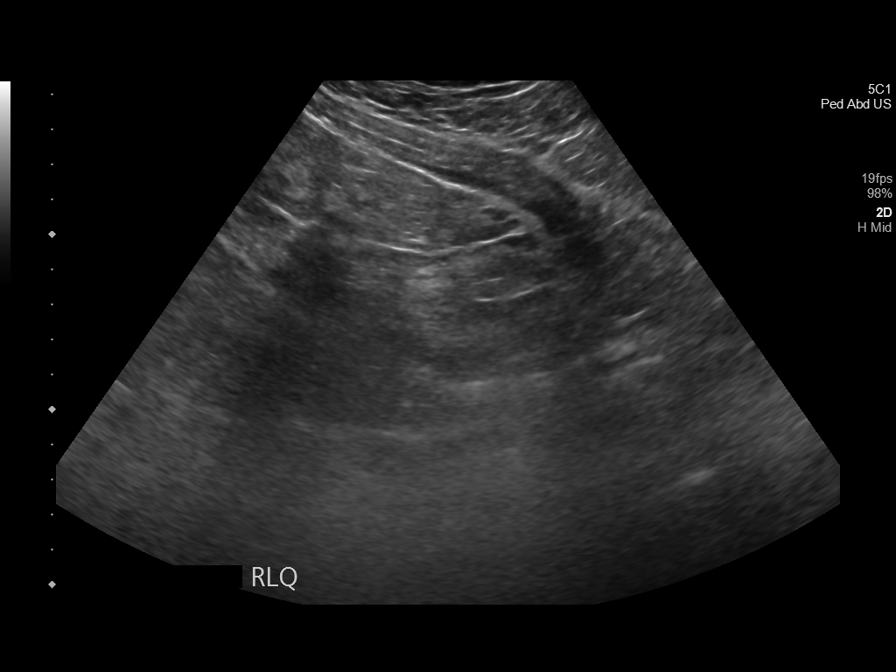
[im 4/9]
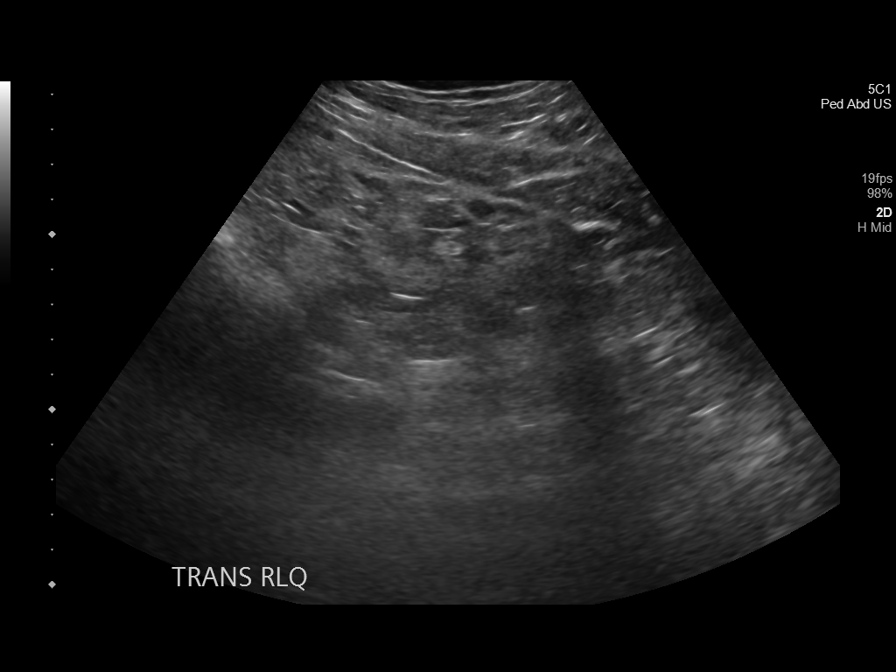
[im 5/9]
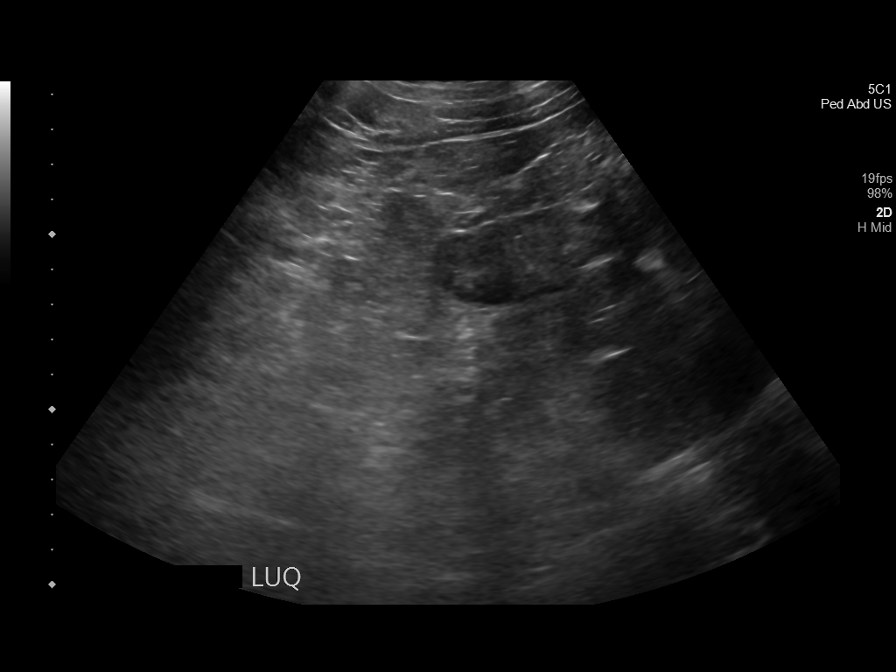
[im 6/9]
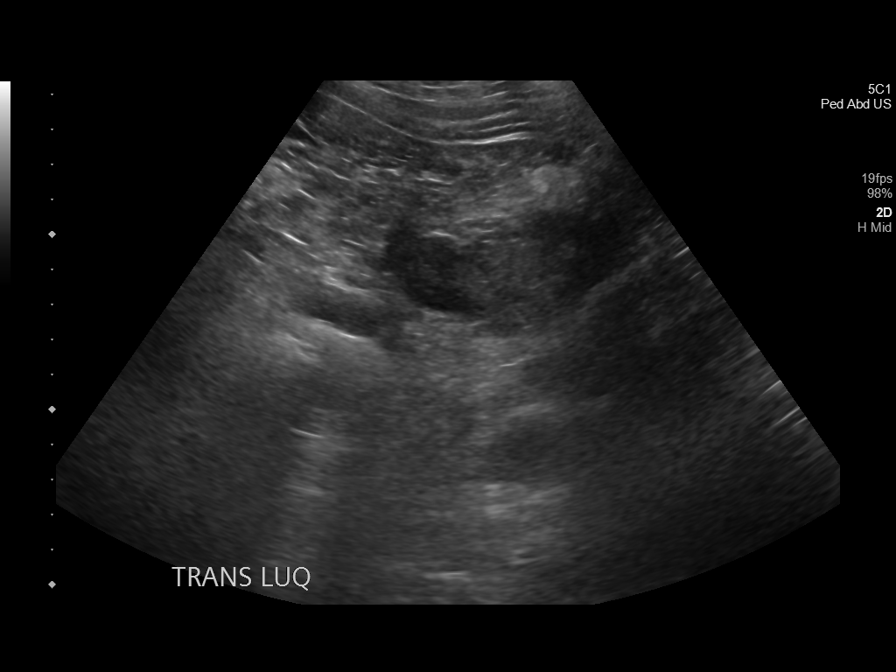
[im 7/9]
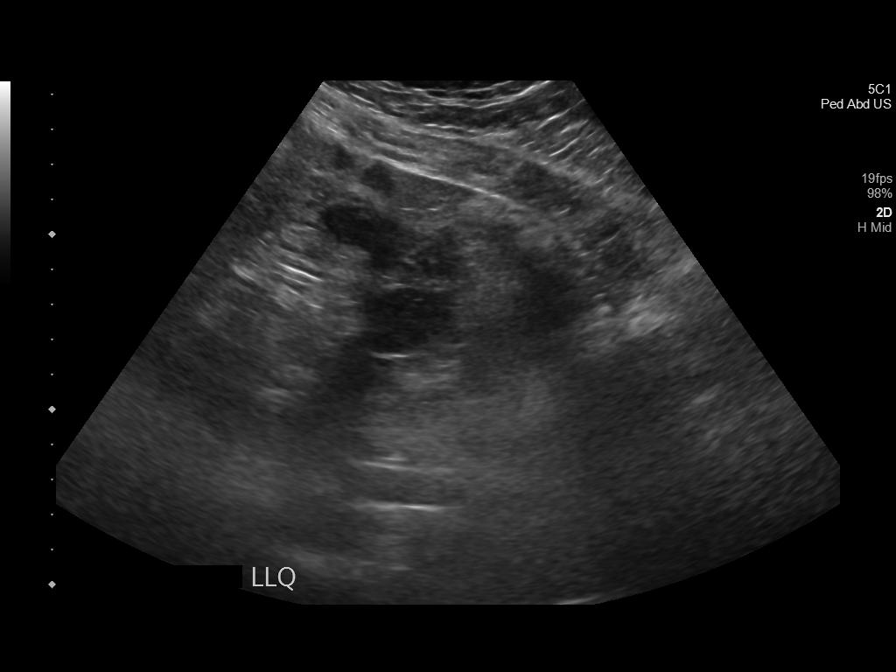
[im 8/9]
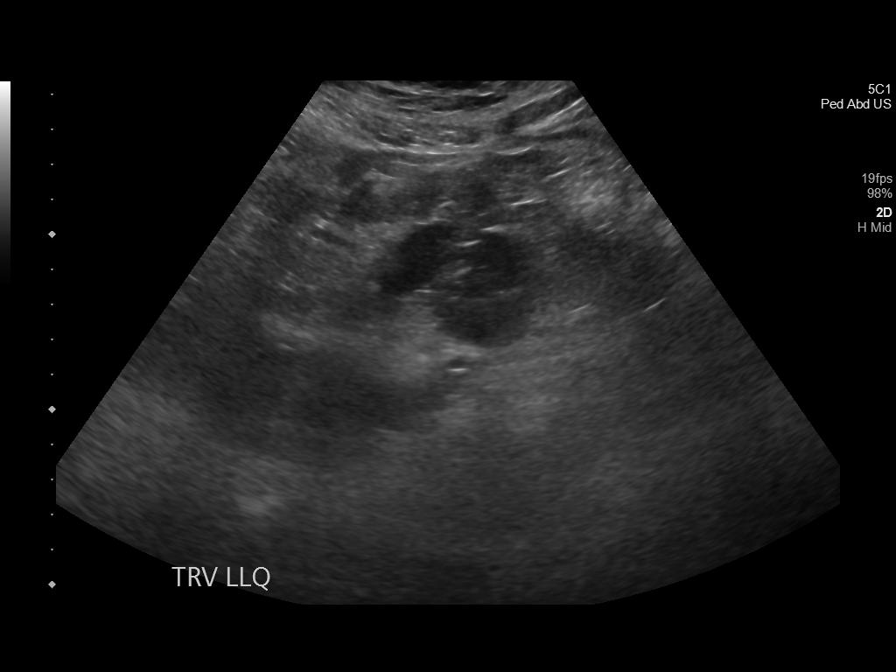
[im 9/9]
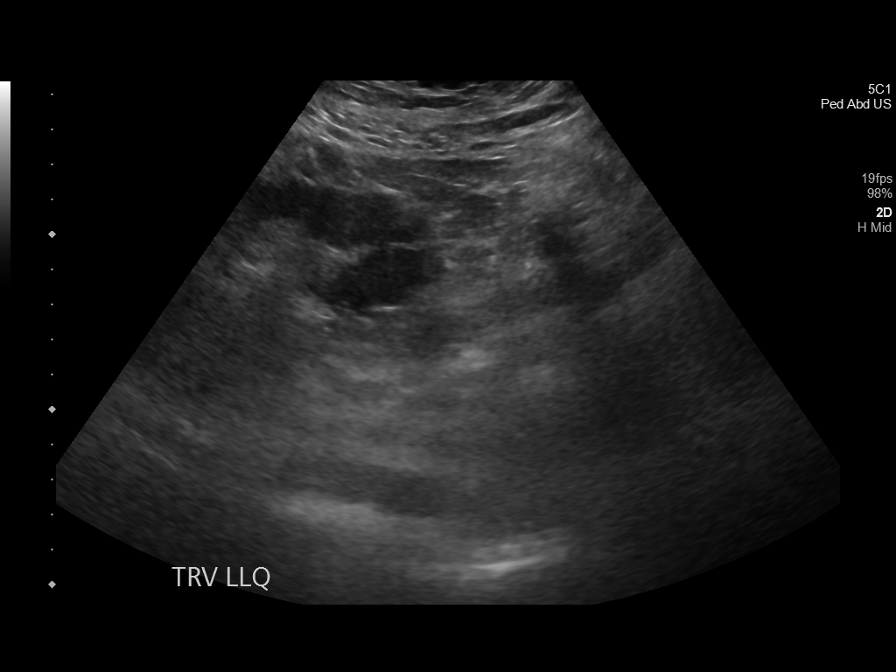

[9 of 9 positions shown; findings below may reference images not displayed]

FINDINGS: No significant ascites is seen
IMPRESSION: No significant ascites is seen.

## 2023-05-30 ENCOUNTER — Other Ambulatory Visit: Payer: Self-pay | Admitting: Internal Medicine

## 2023-05-30 DIAGNOSIS — K3189 Other diseases of stomach and duodenum: Secondary | ICD-10-CM

## 2023-05-30 DIAGNOSIS — R1084 Generalized abdominal pain: Secondary | ICD-10-CM

## 2023-06-23 ENCOUNTER — Ambulatory Visit: Payer: BC Managed Care – PPO | Admitting: Physician Assistant

## 2023-06-23 ENCOUNTER — Encounter: Payer: Self-pay | Admitting: Physician Assistant

## 2023-06-23 VITALS — BP 117/72 | HR 77 | Temp 98.0°F | Resp 16 | Wt 241.1 lb

## 2023-06-23 DIAGNOSIS — H6991 Unspecified Eustachian tube disorder, right ear: Secondary | ICD-10-CM | POA: Insufficient documentation

## 2023-06-23 MED ORDER — AZITHROMYCIN 250 MG PO TABS: ORAL_TABLET | ORAL | 0 refills | Status: DC

## 2023-06-23 NOTE — Patient Instructions (Signed)
Vital signs reviewed.  Exam is consistent with Eustatian tube dysfunction and upper respiratory infection. Please increase fluids. Wash hands frequently. Use tylenol every 4 hours or Ibuprofen every 6 hours for fever or body aches. Zyrtec D or Allegra D (12 hour) for congestion. Zithromax as directed. Return to clinic if not improving.

## 2023-06-23 NOTE — Progress Notes (Signed)
Subjective:    Patient ID: Omar Turner, male    DOB: 1971/08/01, 52 y.o.   MRN: [id number]  Otalgia  There is pain in the right ear. This is a new problem. The current episode started in the past 7 days. The problem occurs hourly. The problem has been gradually worsening. There has been no fever. The pain is moderate. Associated symptoms include headaches and rhinorrhea. Pertinent negatives include no abdominal pain, coughing, diarrhea, ear discharge, neck pain, rash, sore throat or vomiting. He has tried acetaminophen for the symptoms. The treatment provided no relief. His past medical history is significant for a tympanostomy tube.      Review of Systems  Constitutional: Negative.   HENT:  Positive for congestion, ear pain, postnasal drip, rhinorrhea, sinus pressure and sinus pain. Negative for ear discharge, mouth sores and sore throat.   Eyes: Negative.   Respiratory:  Negative for cough, shortness of breath and wheezing.   Gastrointestinal:  Negative for abdominal pain, diarrhea and vomiting.  Musculoskeletal:  Negative for neck pain.  Skin:  Negative for rash.  Neurological:  Positive for headaches.       Objective:   Physical Exam Constitutional:      Appearance: He is not ill-appearing.  HENT:     Head: Normocephalic and atraumatic.     Right Ear: External ear normal.     Left Ear: External ear normal.     Ears:     Comments: Mild to mod fluid behind the drum. Scars from ear tube bilat.. Eyes:     Extraocular Movements: Extraocular movements intact.     Pupils: Pupils are equal, round, and reactive to light.  Cardiovascular:     Rate and Rhythm: Normal rate and regular rhythm.  Pulmonary:     Effort: Pulmonary effort is normal.     Breath sounds: Normal breath sounds.  Abdominal:     General: Abdomen is flat. Bowel sounds are normal.  Musculoskeletal:        General: Normal range of motion.     Cervical back: Normal range of motion and neck supple. No  rigidity.  Lymphadenopathy:     Cervical: Cervical adenopathy present.  Skin:    General: Skin is warm.     Findings: No rash.  Neurological:     General: No focal deficit present.     Mental Status: He is alert.           Assessment & Plan: Eustachian  tube dysfunction Upper respiratory infection  Plan:  Vital signs reviewed.  Exam is consistent with Eustatian tube dysfunction and upper respiratory infection. Please increase fluids. Wash hands frequently. Use tylenol every 4 hours or Ibuprofen every 6 hours for fever or body aches. Zyrtec D or Allegra D (12 hour) for congestion. Zithromax as directed. Return to clinic if not improving.

## 2023-07-10 ENCOUNTER — Ambulatory Visit
Admission: RE | Admit: 2023-07-10 | Discharge: 2023-07-10 | Disposition: A | Discharge status: Home / Self Care | Payer: BC Managed Care – PPO | Source: Ambulatory Visit | Attending: Internal Medicine | Admitting: Internal Medicine

## 2023-07-10 DIAGNOSIS — R1084 Generalized abdominal pain: Secondary | ICD-10-CM

## 2023-07-10 DIAGNOSIS — K3189 Other diseases of stomach and duodenum: Secondary | ICD-10-CM

## 2023-07-10 MED ORDER — IOPAMIDOL (ISOVUE-300) INJECTION 61%
100.0000 mL | Freq: Once | INTRAVENOUS | Status: AC | PRN
  Administered 2023-07-10: 100 mL via INTRAVENOUS

## 2023-09-02 ENCOUNTER — Other Ambulatory Visit: Payer: Self-pay | Admitting: Physician Assistant

## 2023-09-02 ENCOUNTER — Ambulatory Visit
Admission: RE | Admit: 2023-09-02 | Discharge: 2023-09-02 | Disposition: A | Discharge status: Home / Self Care | Payer: Worker's Compensation | Attending: Physician Assistant | Admitting: Physician Assistant

## 2023-09-02 ENCOUNTER — Encounter: Payer: Self-pay | Admitting: Physician Assistant

## 2023-09-02 ENCOUNTER — Ambulatory Visit: Payer: BC Managed Care – PPO | Admitting: Physician Assistant

## 2023-09-02 ENCOUNTER — Ambulatory Visit
Admission: RE | Admit: 2023-09-02 | Discharge: 2023-09-02 | Disposition: A | Discharge status: Home / Self Care | Payer: Worker's Compensation | Source: Ambulatory Visit | Attending: Physician Assistant | Admitting: Physician Assistant

## 2023-09-02 VITALS — BP 138/80 | HR 90 | Temp 98.3°F | Resp 18 | Ht 71.0 in | Wt 228.0 lb

## 2023-09-02 DIAGNOSIS — R059 Cough, unspecified: Secondary | ICD-10-CM | POA: Diagnosis present

## 2023-09-02 DIAGNOSIS — R0989 Other specified symptoms and signs involving the circulatory and respiratory systems: Secondary | ICD-10-CM | POA: Insufficient documentation

## 2023-09-02 DIAGNOSIS — R062 Wheezing: Secondary | ICD-10-CM | POA: Diagnosis present

## 2023-09-02 DIAGNOSIS — J0100 Acute maxillary sinusitis, unspecified: Secondary | ICD-10-CM | POA: Insufficient documentation

## 2023-09-02 LAB — POCT INFLUENZA A/B
Influenza A, POC: NEGATIVE
Influenza B, POC: NEGATIVE

## 2023-09-02 NOTE — Patient Instructions (Signed)
Vital signs are within normal limits. Oxygen level is 96/% on room air. CoVID test 1 week ago - negative. Influenza A and B negative today..  Examination suggest bronchitis and sinusitis. Please increase fluids. Wash hands frequently and use mask until symptoms resolve. Use Zyrtec D every 12 hour for congestion. Use Zithromax as directed. Use Tussionex every 12 hours as needed  for cough. This medication may cause drowsiness,and difficulty concentrating. Use with caution, do not drive or operate machines or perform task requiring concentration when taking this medication. Please have a chest xray performed. The office will call results.  Please return to clinic if not improving.

## 2023-09-02 NOTE — Progress Notes (Signed)
   Subjective:    Patient ID: Omar Turner, male    DOB: [birth year], 52 y.o.   MRN: [id number]  Cough This is a new problem. The current episode started 1 to 4 weeks ago. The problem has been gradually worsening. The problem occurs every few minutes. The cough is Productive of sputum. Associated symptoms include chills, headaches, hemoptysis, myalgias, nasal congestion, postnasal drip and wheezing. Pertinent negatives include no chest pain, ear congestion, ear pain, fever, heartburn, rash, sore throat, sweats or weight loss. Nothing aggravates the symptoms. He has tried oral steroids and OTC cough suppressant for the symptoms. The treatment provided no relief. sleep apnea      Review of Systems  Constitutional:  Positive for chills. Negative for fever and weight loss.  HENT:  Positive for congestion and postnasal drip. Negative for ear pain and sore throat.        Swollen glands in the neck  Respiratory:  Positive for cough, hemoptysis and wheezing.   Cardiovascular:  Negative for chest pain.  Gastrointestinal:  Positive for diarrhea and vomiting. Negative for heartburn.  Musculoskeletal:  Positive for myalgias.  Skin:  Negative for rash.  Neurological:  Positive for headaches.       Objective:   Physical Exam HENT:     Head: Normocephalic.     Right Ear: Tympanic membrane and ear canal normal.     Left Ear: Tympanic membrane and ear canal normal.     Nose: Congestion present.     Comments: Pain to percussion of maxillary sinuses    Mouth/Throat:     Mouth: Mucous membranes are moist.     Pharynx: No oropharyngeal exudate.  Eyes:     Extraocular Movements: Extraocular movements intact.     Pupils: Pupils are equal, round, and reactive to light.  Cardiovascular:     Rate and Rhythm: Normal rate.     Pulses: Normal pulses.  Pulmonary:     Effort: Pulmonary effort is normal.     Breath sounds: Wheezing present.  Abdominal:     General: Abdomen is flat. Bowel sounds are  normal.  Lymphadenopathy:     Cervical: Cervical adenopathy present.  Skin:    General: Skin is warm and dry.     Findings: No rash.  Neurological:     General: No focal deficit present.     Mental Status: He is alert.           Assessment & Plan:  Bronchitis Maxillary sinusitis  Plan:  Vital signs are within normal limits. Oxygen level is 96/5 on room air. CoVID test 1 weekk ago - negative. Influenza A and B negative today..  Examination suggest bronchitis and sinusitis. Please increase fluids. Wash hands frequently and use mask until symptoms resolve. Use Zyrtec D every 12 hour for congestion. Use Tussionex every 12 hours as needed  for cough. This medication may cause drowsiness,and difficulty concentrating. Use with caution, do not drive or operate machines or perform task requiring concentration when taking this medication. Please have a chest xray performed. The office will call results.  Please return to clinic if not improving.

## 2023-09-29 ENCOUNTER — Ambulatory Visit: Payer: BC Managed Care – PPO | Admitting: Dermatology

## 2023-10-06 ENCOUNTER — Ambulatory Visit: Payer: BC Managed Care – PPO | Admitting: Dermatology

## 2023-11-17 ENCOUNTER — Inpatient Hospital Stay
Admission: EM | Admit: 2023-11-17 | Discharge: 2023-11-19 | DRG: 432 | Discharge status: Against Medical Advice | Payer: BC Managed Care – PPO | Attending: Internal Medicine | Admitting: Internal Medicine

## 2023-11-17 ENCOUNTER — Inpatient Hospital Stay: Payer: BC Managed Care – PPO

## 2023-11-17 ENCOUNTER — Other Ambulatory Visit: Payer: Self-pay

## 2023-11-17 DIAGNOSIS — Z888 Allergy status to other drugs, medicaments and biological substances status: Secondary | ICD-10-CM

## 2023-11-17 DIAGNOSIS — F17210 Nicotine dependence, cigarettes, uncomplicated: Secondary | ICD-10-CM | POA: Diagnosis present

## 2023-11-17 DIAGNOSIS — I10 Essential (primary) hypertension: Secondary | ICD-10-CM | POA: Diagnosis present

## 2023-11-17 DIAGNOSIS — K7031 Alcoholic cirrhosis of liver with ascites: Secondary | ICD-10-CM

## 2023-11-17 DIAGNOSIS — R7989 Other specified abnormal findings of blood chemistry: Secondary | ICD-10-CM | POA: Diagnosis present

## 2023-11-17 DIAGNOSIS — I8511 Secondary esophageal varices with bleeding: Secondary | ICD-10-CM | POA: Diagnosis present

## 2023-11-17 DIAGNOSIS — D72829 Elevated white blood cell count, unspecified: Secondary | ICD-10-CM | POA: Diagnosis present

## 2023-11-17 DIAGNOSIS — I8510 Secondary esophageal varices without bleeding: Secondary | ICD-10-CM

## 2023-11-17 DIAGNOSIS — Z8249 Family history of ischemic heart disease and other diseases of the circulatory system: Secondary | ICD-10-CM

## 2023-11-17 DIAGNOSIS — D62 Acute posthemorrhagic anemia: Secondary | ICD-10-CM | POA: Diagnosis present

## 2023-11-17 DIAGNOSIS — R17 Unspecified jaundice: Secondary | ICD-10-CM | POA: Diagnosis not present

## 2023-11-17 DIAGNOSIS — F10132 Alcohol abuse with withdrawal with perceptual disturbance: Secondary | ICD-10-CM | POA: Diagnosis present

## 2023-11-17 DIAGNOSIS — E66811 Obesity, class 1: Secondary | ICD-10-CM | POA: Diagnosis present

## 2023-11-17 DIAGNOSIS — E871 Hypo-osmolality and hyponatremia: Secondary | ICD-10-CM | POA: Diagnosis present

## 2023-11-17 DIAGNOSIS — K3189 Other diseases of stomach and duodenum: Secondary | ICD-10-CM | POA: Diagnosis present

## 2023-11-17 DIAGNOSIS — E785 Hyperlipidemia, unspecified: Secondary | ICD-10-CM | POA: Diagnosis present

## 2023-11-17 DIAGNOSIS — I959 Hypotension, unspecified: Secondary | ICD-10-CM | POA: Diagnosis not present

## 2023-11-17 DIAGNOSIS — R1013 Epigastric pain: Secondary | ICD-10-CM

## 2023-11-17 DIAGNOSIS — F419 Anxiety disorder, unspecified: Secondary | ICD-10-CM | POA: Diagnosis present

## 2023-11-17 DIAGNOSIS — K2980 Duodenitis without bleeding: Secondary | ICD-10-CM

## 2023-11-17 DIAGNOSIS — F1010 Alcohol abuse, uncomplicated: Secondary | ICD-10-CM | POA: Diagnosis not present

## 2023-11-17 DIAGNOSIS — K769 Liver disease, unspecified: Secondary | ICD-10-CM | POA: Diagnosis not present

## 2023-11-17 DIAGNOSIS — K921 Melena: Secondary | ICD-10-CM | POA: Diagnosis not present

## 2023-11-17 DIAGNOSIS — Z5329 Procedure and treatment not carried out because of patient's decision for other reasons: Secondary | ICD-10-CM | POA: Diagnosis present

## 2023-11-17 DIAGNOSIS — R933 Abnormal findings on diagnostic imaging of other parts of digestive tract: Secondary | ICD-10-CM | POA: Diagnosis not present

## 2023-11-17 DIAGNOSIS — K6289 Other specified diseases of anus and rectum: Secondary | ICD-10-CM

## 2023-11-17 DIAGNOSIS — K7010 Alcoholic hepatitis without ascites: Secondary | ICD-10-CM | POA: Diagnosis present

## 2023-11-17 DIAGNOSIS — I8500 Esophageal varices without bleeding: Secondary | ICD-10-CM | POA: Diagnosis not present

## 2023-11-17 DIAGNOSIS — K766 Portal hypertension: Secondary | ICD-10-CM | POA: Diagnosis present

## 2023-11-17 DIAGNOSIS — Z6832 Body mass index (BMI) 32.0-32.9, adult: Secondary | ICD-10-CM

## 2023-11-17 DIAGNOSIS — K7460 Unspecified cirrhosis of liver: Secondary | ICD-10-CM | POA: Diagnosis present

## 2023-11-17 DIAGNOSIS — K219 Gastro-esophageal reflux disease without esophagitis: Secondary | ICD-10-CM | POA: Diagnosis present

## 2023-11-17 DIAGNOSIS — G4733 Obstructive sleep apnea (adult) (pediatric): Secondary | ICD-10-CM | POA: Diagnosis present

## 2023-11-17 DIAGNOSIS — Z85828 Personal history of other malignant neoplasm of skin: Secondary | ICD-10-CM

## 2023-11-17 DIAGNOSIS — K922 Gastrointestinal hemorrhage, unspecified: Principal | ICD-10-CM | POA: Diagnosis present

## 2023-11-17 DIAGNOSIS — Z8719 Personal history of other diseases of the digestive system: Secondary | ICD-10-CM

## 2023-11-17 DIAGNOSIS — E878 Other disorders of electrolyte and fluid balance, not elsewhere classified: Secondary | ICD-10-CM | POA: Diagnosis present

## 2023-11-17 DIAGNOSIS — Z79899 Other long term (current) drug therapy: Secondary | ICD-10-CM | POA: Diagnosis not present

## 2023-11-17 DIAGNOSIS — K920 Hematemesis: Secondary | ICD-10-CM | POA: Diagnosis not present

## 2023-11-17 DIAGNOSIS — R7401 Elevation of levels of liver transaminase levels: Secondary | ICD-10-CM | POA: Diagnosis not present

## 2023-11-17 LAB — COMPREHENSIVE METABOLIC PANEL
ALT: 98 U/L — ABNORMAL HIGH (ref 0–44)
AST: 190 U/L — ABNORMAL HIGH (ref 15–41)
Albumin: 2.9 g/dL — ABNORMAL LOW (ref 3.5–5.0)
Alkaline Phosphatase: 138 U/L — ABNORMAL HIGH (ref 38–126)
Anion gap: 18 — ABNORMAL HIGH (ref 5–15)
BUN: 20 mg/dL (ref 6–20)
CO2: 17 mmol/L — ABNORMAL LOW (ref 22–32)
Calcium: 8.1 mg/dL — ABNORMAL LOW (ref 8.9–10.3)
Chloride: 96 mmol/L — ABNORMAL LOW (ref 98–111)
Creatinine, Ser: 0.63 mg/dL (ref 0.61–1.24)
GFR, Estimated: >60 mL/min (ref >60)
Glucose, Bld: 117 mg/dL — ABNORMAL HIGH (ref 70–99)
Potassium: 4 mmol/L (ref 3.5–5.1)
Sodium: 131 mmol/L — ABNORMAL LOW (ref 135–145)
Total Bilirubin: 5.3 mg/dL — ABNORMAL HIGH (ref 0.0–1.2)
Total Protein: 6.6 g/dL (ref 6.5–8.1)

## 2023-11-17 LAB — TYPE AND SCREEN
ABO/RH(D): O NEG
Antibody Screen: NEGATIVE

## 2023-11-17 LAB — HEMOGLOBIN AND HEMATOCRIT, BLOOD
HCT: 36 % — ABNORMAL LOW (ref 39.0–52.0)
Hemoglobin: 12.5 g/dL — ABNORMAL LOW (ref 13.0–17.0)

## 2023-11-17 LAB — CBC
HCT: 36.7 % — ABNORMAL LOW (ref 39.0–52.0)
Hemoglobin: 13.2 g/dL (ref 13.0–17.0)
MCH: 34.8 pg — ABNORMAL HIGH (ref 26.0–34.0)
MCHC: 36 g/dL (ref 30.0–36.0)
MCV: 96.8 fL (ref 80.0–100.0)
Platelets: 80 10*3/uL — ABNORMAL LOW (ref 150–400)
RBC: 3.79 MIL/uL — ABNORMAL LOW (ref 4.22–5.81)
RDW: 15.1 % (ref 11.5–15.5)
WBC: 11.2 10*3/uL — ABNORMAL HIGH (ref 4.0–10.5)
nRBC: 0 % (ref 0.0–0.2)

## 2023-11-17 LAB — TROPONIN I (HIGH SENSITIVITY)
Troponin I (High Sensitivity): 16 ng/L (ref <18)
Troponin I (High Sensitivity): 9 ng/L (ref <18)

## 2023-11-17 LAB — LIPASE, BLOOD: Lipase: 59 U/L — ABNORMAL HIGH (ref 11–51)

## 2023-11-17 MED ORDER — PANTOPRAZOLE SODIUM 40 MG PO TBEC: 40.0000 mg | DELAYED_RELEASE_TABLET | Freq: Every day | ORAL | Status: DC

## 2023-11-17 MED ORDER — FOLIC ACID 1 MG PO TABS: 1.0000 mg | ORAL_TABLET | Freq: Every day | ORAL | Status: DC

## 2023-11-17 MED ORDER — LACTATED RINGERS IV BOLUS
1000.0000 mL | Freq: Once | INTRAVENOUS | Status: AC
  Administered 2023-11-17: 1000 mL via INTRAVENOUS

## 2023-11-17 MED ORDER — SODIUM CHLORIDE 0.9 % IV SOLN: 50.0000 ug/h | INTRAVENOUS | Status: DC

## 2023-11-17 MED ORDER — SODIUM CHLORIDE 0.9 % IV SOLN
50.0000 ug/h | INTRAVENOUS | Status: DC
  Administered 2023-11-18 – 2023-11-19 (×3): 50 ug/h via INTRAVENOUS
  Filled 2023-11-17 (×8): qty 1

## 2023-11-17 MED ORDER — THIAMINE MONONITRATE 100 MG PO TABS
100.0000 mg | ORAL_TABLET | Freq: Every day | ORAL | Status: DC
  Administered 2023-11-19: 100 mg via ORAL
  Filled 2023-11-17: qty 1

## 2023-11-17 MED ORDER — THIAMINE HCL 100 MG/ML IJ SOLN
100.0000 mg | Freq: Every day | INTRAMUSCULAR | Status: DC
  Administered 2023-11-18: 100 mg via INTRAVENOUS
  Filled 2023-11-17: qty 2

## 2023-11-17 MED ORDER — IOHEXOL 300 MG/ML  SOLN
100.0000 mL | Freq: Once | INTRAMUSCULAR | Status: AC | PRN
  Administered 2023-11-17: 100 mL via INTRAVENOUS

## 2023-11-17 MED ORDER — FERROUS SULFATE 325 (65 FE) MG PO TABS: 324.0000 mg | ORAL_TABLET | ORAL | Status: DC

## 2023-11-17 MED ORDER — ADULT MULTIVITAMIN W/MINERALS CH
1.0000 | ORAL_TABLET | Freq: Every day | ORAL | Status: DC
  Administered 2023-11-19: 1 via ORAL
  Filled 2023-11-17: qty 1

## 2023-11-17 MED ORDER — PANTOPRAZOLE SODIUM 40 MG IV SOLR
40.0000 mg | Freq: Once | INTRAVENOUS | Status: AC
  Administered 2023-11-17: 40 mg via INTRAVENOUS
  Filled 2023-11-17: qty 10

## 2023-11-17 MED ORDER — LORAZEPAM 2 MG/ML IJ SOLN
1.0000 mg | INTRAMUSCULAR | Status: DC | PRN
  Administered 2023-11-17: 2 mg via INTRAVENOUS
  Administered 2023-11-18: 1 mg via INTRAVENOUS
  Administered 2023-11-18: 2 mg via INTRAVENOUS
  Administered 2023-11-18: 4 mg via INTRAVENOUS
  Administered 2023-11-18: 2 mg via INTRAVENOUS
  Filled 2023-11-17: qty 1
  Filled 2023-11-17: qty 2
  Filled 2023-11-17 (×3): qty 1

## 2023-11-17 MED ORDER — ACETAMINOPHEN 325 MG PO TABS
650.0000 mg | ORAL_TABLET | Freq: Four times a day (QID) | ORAL | Status: DC | PRN
  Administered 2023-11-19: 650 mg via ORAL
  Filled 2023-11-17: qty 2

## 2023-11-17 MED ORDER — TRAZODONE HCL 50 MG PO TABS
25.0000 mg | ORAL_TABLET | Freq: Every evening | ORAL | Status: DC | PRN
  Administered 2023-11-18: 25 mg via ORAL
  Filled 2023-11-17: qty 1

## 2023-11-17 MED ORDER — ONDANSETRON 4 MG PO TBDP
4.0000 mg | ORAL_TABLET | Freq: Once | ORAL | Status: AC | PRN
  Administered 2023-11-17: 4 mg via ORAL
  Filled 2023-11-17: qty 1

## 2023-11-17 MED ORDER — LORAZEPAM 1 MG PO TABS
1.0000 mg | ORAL_TABLET | ORAL | Status: DC | PRN
  Administered 2023-11-18 – 2023-11-19 (×2): 2 mg via ORAL
  Administered 2023-11-19 (×2): 4 mg via ORAL
  Filled 2023-11-17: qty 4
  Filled 2023-11-17 (×2): qty 2
  Filled 2023-11-17: qty 4

## 2023-11-17 MED ORDER — NADOLOL 40 MG PO TABS
40.0000 mg | ORAL_TABLET | Freq: Every day | ORAL | Status: DC
  Administered 2023-11-19: 40 mg via ORAL
  Filled 2023-11-17 (×2): qty 1

## 2023-11-17 MED ORDER — ACETAMINOPHEN 650 MG RE SUPP
650.0000 mg | Freq: Four times a day (QID) | RECTAL | Status: DC | PRN
Start: 2023-11-17 — End: 2023-11-19

## 2023-11-17 MED ORDER — PANTOPRAZOLE SODIUM 40 MG IV SOLR
40.0000 mg | Freq: Two times a day (BID) | INTRAVENOUS | Status: DC
  Administered 2023-11-18 – 2023-11-19 (×3): 40 mg via INTRAVENOUS
  Filled 2023-11-17 (×3): qty 10

## 2023-11-17 MED ORDER — ONDANSETRON HCL 4 MG PO TABS: 4.0000 mg | ORAL_TABLET | Freq: Four times a day (QID) | ORAL | Status: DC | PRN

## 2023-11-17 MED ORDER — TAB-A-VITE/IRON PO TABS: 1.0000 | ORAL_TABLET | Freq: Every day | ORAL | Status: DC

## 2023-11-17 MED ORDER — SODIUM CHLORIDE 0.9 % IV SOLN: INTRAVENOUS | Status: AC

## 2023-11-17 MED ORDER — SODIUM CHLORIDE 0.9 % IV SOLN
1.0000 g | Freq: Once | INTRAVENOUS | Status: AC
  Administered 2023-11-17: 1 g via INTRAVENOUS
  Filled 2023-11-17: qty 10

## 2023-11-17 MED ORDER — FOLIC ACID 1 MG PO TABS
1.0000 mg | ORAL_TABLET | Freq: Every day | ORAL | Status: DC
  Administered 2023-11-19: 1 mg via ORAL
  Filled 2023-11-17: qty 1

## 2023-11-17 MED ORDER — LORATADINE 10 MG PO TABS
10.0000 mg | ORAL_TABLET | Freq: Every day | ORAL | Status: DC
  Administered 2023-11-19: 10 mg via ORAL
  Filled 2023-11-17: qty 1

## 2023-11-17 MED ORDER — OCTREOTIDE LOAD VIA INFUSION
50.0000 ug | Freq: Once | INTRAVENOUS | Status: AC
  Administered 2023-11-18: 50 ug via INTRAVENOUS
  Filled 2023-11-17: qty 25

## 2023-11-17 MED ORDER — ONDANSETRON HCL 4 MG/2ML IJ SOLN
4.0000 mg | Freq: Four times a day (QID) | INTRAMUSCULAR | Status: DC | PRN
  Administered 2023-11-17 – 2023-11-18 (×3): 4 mg via INTRAVENOUS
  Filled 2023-11-17 (×3): qty 2

## 2023-11-17 NOTE — ED Triage Notes (Signed)
 Pt to ED via POV c/o upper abd pain that started 2 weeks ago. Pt has cirrhosis and relapsed on alcohol about a month ago. Has been vomiting blood for 3-4 days and having blood in stool x 2 weeks. Endorses nausea, dizziness, and shob. Denies CP,

## 2023-11-17 NOTE — ED Provider Notes (Signed)
 [facility] Provider Note   Event Date/Time   First MD Initiated Contact with Patient 11/17/23 2141     (approximate) History  Abdominal Pain  HPI Omar Turner is a 53 y.o. male with a stated past medical history of cirrhosis with esophageal varices and recent relapse in alcohol abuse who presents complaining of 2 weeks of melena as well as 3-4 days of coffee-ground emesis.  Patient endorses orthostatic lightheadedness as well as epigastric pain.  Patient states that this episode is similar to previous when he has had esophageal bleeding. ROS: Patient currently denies any vision changes, tinnitus, difficulty speaking, facial droop, sore throat, chest pain, shortness of breath, diarrhea, dysuria, or weakness/numbness/paresthesias in any extremity   Physical Exam  Triage Vital Signs: ED Triage Vitals  Encounter Vitals Group     BP 11/17/23 1906 106/74     Systolic BP Percentile --      Diastolic BP Percentile --      Pulse Rate 11/17/23 1906 95     Resp 11/17/23 1906 18     Temp 11/17/23 1906 98.2 F (36.8 C)     Temp Source 11/17/23 1906 Oral     SpO2 11/17/23 1906 95 %     Weight 11/17/23 1904 230 lb (104.3 kg)     Height 11/17/23 1904 5\' 11"  (1.803 m)     Head Circumference --      Peak Flow --      Pain Score 11/17/23 1904 9     Pain Loc --      Pain Education --      Exclude from Growth Chart --    Most recent vital signs: Vitals:   11/17/23 1906  BP: 106/74  Pulse: 95  Resp: 18  Temp: 98.2 F (36.8 C)  SpO2: 95%   General: Awake, oriented x4. CV:  Good peripheral perfusion.  Resp:  Normal effort.  Abd:  No distention.  Other:  Middle-aged overweight Caucasian male resting comfortably in no acute distress ED Results / Procedures / Treatments  Labs (all labs ordered are listed, but only abnormal results are displayed) Labs Reviewed  LIPASE, BLOOD - Abnormal; Notable for the following components:      Result Value   Lipase 59 (*)     All other components within normal limits  COMPREHENSIVE METABOLIC PANEL - Abnormal; Notable for the following components:   Sodium 131 (*)    Chloride 96 (*)    CO2 17 (*)    Glucose, Bld 117 (*)    Calcium  8.1 (*)    Albumin  2.9 (*)    AST 190 (*)    ALT 98 (*)    Alkaline Phosphatase 138 (*)    Total Bilirubin 5.3 (*)    Anion gap 18 (*)    All other components within normal limits  CBC - Abnormal; Notable for the following components:   WBC 11.2 (*)    RBC 3.79 (*)    HCT 36.7 (*)    MCH 34.8 (*)    Platelets 80 (*)    All other components within normal limits  URINALYSIS, ROUTINE W REFLEX MICROSCOPIC  POC OCCULT BLOOD, ED  TYPE AND SCREEN  TROPONIN I (HIGH SENSITIVITY)  TROPONIN I (HIGH SENSITIVITY)   EKG ED ECG REPORT I, Charleen Conn, the attending physician, personally viewed and interpreted this ECG. Date: 11/17/2023 EKG Time: 1908 Rate: 94 Rhythm: normal sinus rhythm QRS Axis: normal Intervals: normal ST/T Wave abnormalities:  normal Narrative Interpretation: no evidence of acute ischemia RADIOLOGY ED MD interpretation: CT of the abdomen pelvis pending prior to admission -Agree with radiology assessment Official radiology report(s): No results found. PROCEDURES: Critical Care performed: Yes, see critical care procedure note(s) .1-3 Lead EKG Interpretation  Performed by: Charleen Conn, MD Authorized by: Charleen Conn, MD     Interpretation: normal     ECG rate:  91   ECG rate assessment: normal     Rhythm: sinus rhythm     Ectopy: none     Conduction: normal   CRITICAL CARE Performed by: Zaeden Lastinger K Eder Macek  Total critical care time: 33 minutes  Critical care time was exclusive of separately billable procedures and treating other patients.  Critical care was necessary to treat or prevent imminent or life-threatening deterioration.  Critical care was time spent personally by me on the following activities: development of treatment plan with  patient and/or surrogate as well as nursing, discussions with consultants, evaluation of patient's response to treatment, examination of patient, obtaining history from patient or surrogate, ordering and performing treatments and interventions, ordering and review of laboratory studies, ordering and review of radiographic studies, pulse oximetry and re-evaluation of patient's condition.  MEDICATIONS ORDERED IN ED: Medications  ondansetron  (ZOFRAN -ODT) disintegrating tablet 4 mg (4 mg Oral Given 11/17/23 1909)   IMPRESSION / MDM / ASSESSMENT AND PLAN / ED COURSE  I reviewed the triage vital signs and the nursing notes.                             The patient is on the cardiac monitor to evaluate for evidence of arrhythmia and/or significant heart rate changes. Patient's presentation is most consistent with acute presentation with potential threat to life or bodily function. + abdominal pain + mixed coffee ground and bright red hematemesis + black stool per rectum Given history and exam patient's presentation most consistent with upper GI bleed possibly secondary to peptic ulcer disease or variceal bleeding. I have low suspicion for aortoenteric fistula, ENT bleeding mimic, Boerhaave's, Pulmonary bleeding mimic.  Workup: CBC, BMP, LFTs, Lipase, PT/INR, Type and Screen  Interventions: Analgesia and antiemetic medications PRN Protonix  40mg  IVP Octreotide  push -> 50 mcg/hr drip Ceftriaxone  1 gram IV PRBC transfusion  Findings: Hb: 13.2  Disposition: Admit for close monitoring.   FINAL CLINICAL IMPRESSION(S) / ED DIAGNOSES   Final diagnoses:  None   Rx / DC Orders   ED Discharge Orders     None      Note:  This document was prepared using Dragon voice recognition software and may include unintentional dictation errors.   Charleen Conn, MD 11/17/23 [phone]

## 2023-11-17 NOTE — ED Notes (Signed)
 Called [facility] to initiate cardiac monitoring - strip sent.

## 2023-11-17 NOTE — H&P (Addendum)
PATIENT NAME: Omar Turner    MR#:  [id number]  DATE OF BIRTH:  10/08/1970  DATE OF ADMISSION:  11/17/2023  PRIMARY CARE PHYSICIAN: Lynnea Ferrier, MD   Patient is coming from: Home  REQUESTING/REFERRING PHYSICIAN: Donna Bernard, MD  CHIEF COMPLAINT:   Chief Complaint  Patient presents with   Abdominal Pain    HISTORY OF PRESENT ILLNESS:  Omar Turner is a 53 y.o. male with medical history significant for alcohol abuse, anxiety, essential hypertension, GERD, dyslipidemia, OSA and tobacco abuse, who presented to the emergency room with acute onset of coffee-ground emesis with nausea as well as diarrhea with melena as well as bright red bleeding per rectum which have been intermittent over the last 8 days.  He admits to associated epigastric and right upper quadrant abdominal pain.  He admitted to occasional dyspnea and cough that is similar to his smoker's cough with occasional wheezing.  He denied any chest pain or palpitations.  He has been having diminished urine output.  No dysuria, oliguria or hematuria or flank pain.  No other bleeding diathesis.  ED Course: When the patient came to the ER, vital signs were within normal and later on heart rate was 101.  Labs revealed hyponatremia and hypochloremia as well as CO2 of 17 with calcium 8.1 and anion gap 18.  Alk phos was slightly elevated 138 with an albumin of 2.9 total protein of 5.3, AST of 198 ALT of 98 with serum lipase of 59.  Total bili was 5.3.  High sensitive troponin I was 16 and later 9.  CBC showed leukocytosis of 11.2 with hemoglobin of 13 point hematocrit 36.7.  Repeat H&H were 12.5 and 36.  Blood group was O+ with negative antibody screen. EKG as reviewed by me : EKG showed normal sinus rhythm with a rate of 94 with T wave inversion inferiorly. Imaging: 2 view chest x-ray showed no acute cardiopulmonary disease.. Abdominal pelvic CT scan with contrast revealed the following: 1. Duodenal wall  thickening with paraduodenal hazy fat stranding. Findings favor duodenitis the groove pancreatitis could appear similarly. 2. Proctitis. 3. Wall thickening about the colon. This may be due to decompression, mild colitis, or congestive colopathy 4. Cirrhosis with sequela of portal hypertension including perigastric varices. Heterogenous perfusion of the liver. Question peripherally enhancing exophytic lesion in the inferior right hepatic lobe measuring 3.5 cm. Recommend further evaluation with nonemergent MRI of the abdomen with and without contrast. 5.  Aortic Atherosclerosis.  The patient was given IV Protonix 40 mg, IV octreotide 50 mcg and 1 L bolus of IV lactated ringer as well as 1 g of IV Rocephin.  He is placed on CIWA protocol.  The patient will be admitted to a medical telemetry bed for further evaluation and management. PAST MEDICAL HISTORY:   Past Medical History:  Diagnosis Date   Anginal pain (HCC)    Anxiety    Basal cell carcinoma 11/26/2022   Superficial, left spinal upper back, clear with biopsy   Basal cell carcinoma 11/26/2022   L post neck, clear with biopsy   Basal cell carcinoma 05/29/2021   anterior neck - R anterolateral, exc at [facility]   Basal cell carcinoma 06/26/2021   L med scapular back, exc at [facility] cell carcinoma 06/12/2021   L post forearm, Rober Minion   Basal cell carcinoma 06/12/2021   R scapular back, Rober Minion   Basal cell carcinoma 06/12/2021  Central scapular back   Basal cell carcinoma 12/04/2022   Left Upper Arm - Anterior, [facility]   Basal cell carcinoma 12/04/2022   L upper chest inferior, [facility]   Basal cell carcinoma 12/04/2022   left upper chest lateral, [facility]   Basal cell carcinoma 12/04/2022   left upper chest medial, [facility]   Basal cell carcinoma 12/30/2022   R post neck, [facility]   Basal cell carcinoma 04/01/2023   left mid chest, [facility]   Degenerative disc disease, cervical    Diverticulitis large intestine     dysplastic nevus 11/26/2022   R spinal mid back, shave removal 12/30/22   GERD (gastroesophageal reflux disease)    History of chest pain    Hyperlipidemia    Hypertension    Sleep apnea    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     PAST SURGICAL HISTORY:   Past Surgical History:  Procedure Laterality Date   APPENDECTOMY     CARDIAC CATHETERIZATION Left 06/17/2016   Procedure: Left Heart Cath and Coronary Angiography;  Surgeon: Iran Ouch, MD;  Location: [facility] INVASIVE CV LAB;  Service: Cardiovascular;  Laterality: Left;   COLONOSCOPY     COLONOSCOPY WITH PROPOFOL N/A 09/07/2015   Procedure: COLONOSCOPY WITH PROPOFOL;  Surgeon: Wallace Cullens, MD;  Location: [facility] ENDOSCOPY;  Service: Gastroenterology;  Laterality: N/A;   COLONOSCOPY WITH PROPOFOL N/A 05/12/2018   Procedure: COLONOSCOPY WITH PROPOFOL;  Surgeon: Wyline Mood, MD;  Location: [facility] ENDOSCOPY;  Service: Gastroenterology;  Laterality: N/A;   COLONOSCOPY WITH PROPOFOL N/A 02/26/2022   Procedure: COLONOSCOPY WITH PROPOFOL;  Surgeon: Napoleon Form, MD;  Location: [facility] ENDOSCOPY;  Service: Gastroenterology;  Laterality: N/A;   ESOPHAGEAL BANDING  05/09/2023   Procedure: ESOPHAGEAL BANDING;  Surgeon: Norma Fredrickson, Boykin Nearing, MD;  Location: [facility] ENDOSCOPY;  Service: Gastroenterology;;   ESOPHAGOGASTRODUODENOSCOPY (EGD) WITH PROPOFOL N/A 11/16/2021   Procedure: ESOPHAGOGASTRODUODENOSCOPY (EGD) WITH PROPOFOL;  Surgeon: Jaynie Collins, DO;  Location: [facility] ENDOSCOPY;  Service: Gastroenterology;  Laterality: N/A;   ESOPHAGOGASTRODUODENOSCOPY (EGD) WITH PROPOFOL N/A 02/26/2022   Procedure: ESOPHAGOGASTRODUODENOSCOPY (EGD) WITH PROPOFOL;  Surgeon: Napoleon Form, MD;  Location: [facility] ENDOSCOPY;  Service: Gastroenterology;  Laterality: N/A;   ESOPHAGOGASTRODUODENOSCOPY (EGD) WITH PROPOFOL N/A 05/09/2023    Procedure: ESOPHAGOGASTRODUODENOSCOPY (EGD) WITH PROPOFOL;  Surgeon: Toledo, Boykin Nearing, MD;  Location: [facility] ENDOSCOPY;  Service: Gastroenterology;  Laterality: N/A;   HERNIA REPAIR     IR RADIOLOGIST EVAL & MGMT  02/01/2021   POLYPECTOMY  02/26/2022   Procedure: POLYPECTOMY;  Surgeon: Napoleon Form, MD;  Location: [facility] ENDOSCOPY;  Service: Gastroenterology;;   VENTRAL HERNIA REPAIR N/A 01/17/2021   Procedure: HERNIA REPAIR VENTRAL ADULT;  Surgeon: Leafy Ro, MD;  Location: [facility] ORS;  Service: General;  Laterality: N/A;    SOCIAL HISTORY:   Social History   Tobacco Use   Smoking status: Every Day    Current packs/day: 1.50    Average packs/day: 1.5 packs/day for 15.0 years (22.5 ttl pk-yrs)    Types: Cigarettes   Smokeless tobacco: Never  Substance Use Topics   Alcohol use: Yes    Alcohol/week: 14.0 standard drinks of alcohol    Types: 14 Shots of liquor per week    Comment: per day    FAMILY HISTORY:   Family History  Problem Relation Age of Onset  Hypertension Father    Heart disease Father        CABG x 3 & valve replacement.    Heart attack Father     DRUG ALLERGIES:   Allergies  Allergen Reactions   Chantix [Varenicline]    Lexapro [Escitalopram]          REVIEW OF SYSTEMS:   ROS As per history of present illness. All pertinent systems were reviewed above. Constitutional, HEENT, cardiovascular, respiratory, GI, GU, musculoskeletal, neuro, psychiatric, endocrine, integumentary and hematologic systems were reviewed and are otherwise negative/unremarkable except for positive findings mentioned above in the HPI.   MEDICATIONS AT HOME:   Prior to Admission medications   Medication Sig Start Date End Date Taking? Authorizing Provider  azithromycin (ZITHROMAX) 250 MG tablet Take 2 tablets PO day 1, then 1 tab PO daily x4 days. Patient not taking: Reported on 09/02/2023 06/23/23   Ivery Quale, PA-C  cetirizine (ZYRTEC) 10 MG tablet Take 10 mg by mouth  daily.    [provider]  ferrous sulfate 324 MG TBEC Take 1 tablet (324 mg total) by mouth every other day. 05/11/23   Lurene Shadow, MD  folic acid (FOLVITE) 1 MG tablet Take 1 tablet (1 mg total) by mouth daily. 05/11/23   Lurene Shadow, MD  furosemide (LASIX) 20 MG tablet Take 1 tablet (20 mg total) by mouth daily. 05/11/23   Lurene Shadow, MD  nadolol (CORGARD) 40 MG tablet Take 1 tablet (40 mg total) by mouth daily. 05/11/23   Lurene Shadow, MD  pantoprazole (PROTONIX) 40 MG tablet Take 1 tablet (40 mg total) by mouth daily. 05/11/23   Lurene Shadow, MD  spironolactone (ALDACTONE) 100 MG tablet Take 1 tablet (100 mg total) by mouth daily. 05/11/23   Lurene Shadow, MD      VITAL SIGNS:  Blood pressure 105/71, pulse (!) 107, temperature 98.2 F (36.8 C), resp. rate 19, height 5\' 11"  (1.803 m), weight 104.3 kg, SpO2 97%.  PHYSICAL EXAMINATION:  Physical Exam  GENERAL:  53 y.o.-year-old Caucasian male patient lying in the bed with no acute distress.  EYES: Pupils equal, round, reactive to light and accommodation. No scleral icterus. Extraocular muscles intact.  HEENT: Head atraumatic, normocephalic. Oropharynx and nasopharynx clear.  NECK:  Supple, no jugular venous distention. No thyroid enlargement, no tenderness.  LUNGS: Normal breath sounds bilaterally, no wheezing, rales,rhonchi or crepitation. No use of accessory muscles of respiration.  CARDIOVASCULAR: Regular rate and rhythm, S1, S2 normal. No murmurs, rubs, or gallops.  ABDOMEN: Soft, nondistended, with mild epigastric and right upper quadrant abdominal tenderness without rebound tenderness guarding or rigidity and negative Murphy sign.  Bowel sounds present. No organomegaly or mass.  EXTREMITIES: No pedal edema, cyanosis, or clubbing.  NEUROLOGIC: Cranial nerves II through XII are intact. Muscle strength 5/5 in all extremities. Sensation intact. Gait not checked.  PSYCHIATRIC: The patient is alert and oriented x 3.  Normal  affect and good eye contact. SKIN: No obvious rash, lesion, or ulcer.   LABORATORY PANEL:   CBC Recent Labs  Lab 11/17/23 1911 11/17/23 2315  WBC 11.2*  --   HGB 13.2 12.5*  HCT 36.7* 36.0*  PLT 80*  --    ------------------------------------------------------------------------------------------------------------------  Chemistries  Recent Labs  Lab 11/17/23 1911  NA 131*  K 4.0  CL 96*  CO2 17*  GLUCOSE 117*  BUN 20  CREATININE 0.63  CALCIUM 8.1*  AST 190*  ALT 98*  ALKPHOS 138*  BILITOT 5.3*   ------------------------------------------------------------------------------------------------------------------  Cardiac Enzymes No results for input(s): "TROPONINI" in the last 168 hours. ------------------------------------------------------------------------------------------------------------------  RADIOLOGY:  CT ABDOMEN PELVIS W CONTRAST Result Date: 11/18/2023 CLINICAL DATA:  Right lower quadrant abdominal pain. Vomiting blood for 3-4 days. Blood in stool. Nausea, dizziness, shortness of breath EXAM: CT ABDOMEN AND PELVIS WITH CONTRAST TECHNIQUE: Multidetector CT imaging of the abdomen and pelvis was performed using the standard protocol following bolus administration of intravenous contrast. RADIATION DOSE REDUCTION: This exam was performed according to the departmental dose-optimization program which includes automated exposure control, adjustment of the mA and/or kV according to patient size and/or use of iterative reconstruction technique. CONTRAST:  OMNIPAQUE IOHEXOL 300 MG/ML  SOLN COMPARISON:  CT abdomen and pelvis 07/10/2023 FINDINGS: Lower chest: No acute abnormality. Hepatobiliary: Nodular contour of the liver compatible with cirrhosis. Heterogenous enhancement throughout the liver likely due to perfusional anomalies in the setting of cirrhosis. Question peripherally enhancing exophytic lesion in the inferior right hepatic lobe measuring 3.5 x 3.1 cm (series  2/image 36). Unremarkable gallbladder.  No biliary dilation. Pancreas: No ductal dilation. Mild haziness of the fat in the duodenal groove. Spleen: Unremarkable. Adrenals/Urinary Tract: Stable adrenal glands and kidneys. No urinary calculi or hydronephrosis. Unremarkable bladder. Stomach/Bowel: Normal caliber large and small bowel. Bowel wall thickening and inflammatory fat stranding about the rectum. Decompressed colon with questionable wall thickening. Stomach is within normal limits. Duodenal wall thickening with Peri duodenal hazy fat stranding. Appendectomy. Vascular/Lymphatic: Portal vein is patent. Perigastric varices in the left upper quadrant. Aortic atherosclerosis. No enlarged abdominal or pelvic lymph nodes. Reproductive: No acute abnormality. Other: No free intraperitoneal air. No abscess. Stranding and fluid in the right anterior pararenal space and right pericolic gutter. Musculoskeletal: No acute fracture. IMPRESSION: 1. Duodenal wall thickening with paraduodenal hazy fat stranding. Findings favor duodenitis the groove pancreatitis could appear similarly. 2. Proctitis. 3. Wall thickening about the colon. This may be due to decompression, mild colitis, or congestive colopathy 4. Cirrhosis with sequela of portal hypertension including perigastric varices. Heterogenous perfusion of the liver. Question peripherally enhancing exophytic lesion in the inferior right hepatic lobe measuring 3.5 cm. Recommend further evaluation with nonemergent MRI of the abdomen with and without contrast. 5.  Aortic Atherosclerosis (ICD10-I70.0). Electronically Signed   By: Minerva Fester M.D.   On: 11/18/2023 00:35      IMPRESSION AND PLAN:  Assessment and Plan: * GI bleeding - The patient be admitted to a medical telemetry bed. - We will follow serial hemoglobins. - We will continue IV Protonix as well as IV octreotide infusion. - GI consult to be obtained. - I notified Dr. Mia Creek about the patient. -  Duodenitis may explain upper GI bleeding and proctitis lower GI bleeding.  Alcohol abuse - The patient will be placed on CIWA protocol.  Duodenitis - The patient will be on IV PPI therapy as mentioned above. -GI consultation will be obtained as above.  Elevated LFTs - This associated with chronic liver cirrhosis and liver functions have been fairly stable.  Essential hypertension - We will continue antihypertensive therapy.   DVT prophylaxis: SCDs. Advanced Care Planning:  Code Status: full code.  Family Communication:  The plan of care was discussed in details with the patient (and family). I answered all questions. The patient agreed to proceed with the above mentioned plan. Further management will depend upon hospital course. Disposition Plan: Back to previous home environment Consults called: GI  All the records are reviewed and case discussed with ED provider.  Status is: Inpatient  At the time of the admission, it appears that the appropriate admission status for this patient is inpatient.  This is judged to be reasonable and necessary in order to provide the required intensity of service to ensure the patient's safety given the presenting symptoms, physical exam findings and initial radiographic and laboratory data in the context of comorbid conditions.  The patient requires inpatient status due to high intensity of service, high risk of further deterioration and high frequency of surveillance required.  I certify that at the time of admission, it is my clinical judgment that the patient will require inpatient hospital care extending more than 2 midnights.                            Dispo: The patient is from: Home              Anticipated d/c is to: Home              Patient currently is not medically stable to d/c.              Difficult to place patient: No  Hannah Beat M.D on 11/18/2023 at 4:13 AM  [facility]   From 7 PM-7 AM, contact  night-coverage [email or web address]  CC: Primary care physician; Lynnea Ferrier, MD

## 2023-11-18 ENCOUNTER — Encounter
Admission: EM | Discharge status: Against Medical Advice | Payer: Self-pay | Source: Home / Self Care | Attending: Internal Medicine

## 2023-11-18 ENCOUNTER — Inpatient Hospital Stay: Payer: BC Managed Care – PPO | Admitting: Anesthesiology

## 2023-11-18 ENCOUNTER — Encounter: Payer: Self-pay | Admitting: Family Medicine

## 2023-11-18 DIAGNOSIS — K3189 Other diseases of stomach and duodenum: Secondary | ICD-10-CM | POA: Diagnosis not present

## 2023-11-18 DIAGNOSIS — K920 Hematemesis: Secondary | ICD-10-CM

## 2023-11-18 DIAGNOSIS — R17 Unspecified jaundice: Secondary | ICD-10-CM

## 2023-11-18 DIAGNOSIS — K922 Gastrointestinal hemorrhage, unspecified: Principal | ICD-10-CM

## 2023-11-18 DIAGNOSIS — I8511 Secondary esophageal varices with bleeding: Secondary | ICD-10-CM

## 2023-11-18 DIAGNOSIS — I8510 Secondary esophageal varices without bleeding: Secondary | ICD-10-CM

## 2023-11-18 DIAGNOSIS — I10 Essential (primary) hypertension: Secondary | ICD-10-CM | POA: Insufficient documentation

## 2023-11-18 DIAGNOSIS — K7010 Alcoholic hepatitis without ascites: Secondary | ICD-10-CM

## 2023-11-18 DIAGNOSIS — K2980 Duodenitis without bleeding: Secondary | ICD-10-CM

## 2023-11-18 DIAGNOSIS — R933 Abnormal findings on diagnostic imaging of other parts of digestive tract: Secondary | ICD-10-CM

## 2023-11-18 DIAGNOSIS — I8500 Esophageal varices without bleeding: Secondary | ICD-10-CM | POA: Diagnosis not present

## 2023-11-18 DIAGNOSIS — R7401 Elevation of levels of liver transaminase levels: Secondary | ICD-10-CM

## 2023-11-18 DIAGNOSIS — F1010 Alcohol abuse, uncomplicated: Secondary | ICD-10-CM | POA: Diagnosis not present

## 2023-11-18 HISTORY — PX: ESOPHAGEAL BANDING: SHX5518

## 2023-11-18 HISTORY — PX: ESOPHAGOGASTRODUODENOSCOPY (EGD) WITH PROPOFOL: SHX5813

## 2023-11-18 LAB — COMPREHENSIVE METABOLIC PANEL
ALT: 104 U/L — ABNORMAL HIGH (ref 0–44)
AST: 219 U/L — ABNORMAL HIGH (ref 15–41)
Albumin: 2.6 g/dL — ABNORMAL LOW (ref 3.5–5.0)
Alkaline Phosphatase: 116 U/L (ref 38–126)
Anion gap: 13 (ref 5–15)
BUN: 28 mg/dL — ABNORMAL HIGH (ref 6–20)
CO2: 23 mmol/L (ref 22–32)
Calcium: 7.6 mg/dL — ABNORMAL LOW (ref 8.9–10.3)
Chloride: 94 mmol/L — ABNORMAL LOW (ref 98–111)
Creatinine, Ser: 0.6 mg/dL — ABNORMAL LOW (ref 0.61–1.24)
GFR, Estimated: >60 mL/min (ref >60)
Glucose, Bld: 108 mg/dL — ABNORMAL HIGH (ref 70–99)
Potassium: 4 mmol/L (ref 3.5–5.1)
Sodium: 130 mmol/L — ABNORMAL LOW (ref 135–145)
Total Bilirubin: 6.4 mg/dL — ABNORMAL HIGH (ref 0.0–1.2)
Total Protein: 6.1 g/dL — ABNORMAL LOW (ref 6.5–8.1)

## 2023-11-18 LAB — URINALYSIS, ROUTINE W REFLEX MICROSCOPIC
Bilirubin Urine: NEGATIVE
Glucose, UA: NEGATIVE mg/dL
Hgb urine dipstick: NEGATIVE
Ketones, ur: 20 mg/dL — AB
Leukocytes,Ua: NEGATIVE
Nitrite: NEGATIVE
Protein, ur: NEGATIVE mg/dL
Specific Gravity, Urine: >1.046 — ABNORMAL HIGH (ref 1.005–1.030)
pH: 5 (ref 5.0–8.0)

## 2023-11-18 LAB — CBC
HCT: 29.5 % — ABNORMAL LOW (ref 39.0–52.0)
Hemoglobin: 10.7 g/dL — ABNORMAL LOW (ref 13.0–17.0)
MCH: 34.9 pg — ABNORMAL HIGH (ref 26.0–34.0)
MCHC: 36.3 g/dL — ABNORMAL HIGH (ref 30.0–36.0)
MCV: 96.1 fL (ref 80.0–100.0)
Platelets: 68 10*3/uL — ABNORMAL LOW (ref 150–400)
RBC: 3.07 MIL/uL — ABNORMAL LOW (ref 4.22–5.81)
RDW: 15.4 % (ref 11.5–15.5)
WBC: 11.8 10*3/uL — ABNORMAL HIGH (ref 4.0–10.5)
nRBC: 0 % (ref 0.0–0.2)

## 2023-11-18 LAB — HEMOGLOBIN AND HEMATOCRIT, BLOOD
HCT: 29.9 % — ABNORMAL LOW (ref 39.0–52.0)
Hemoglobin: 10.5 g/dL — ABNORMAL LOW (ref 13.0–17.0)

## 2023-11-18 SURGERY — ESOPHAGOGASTRODUODENOSCOPY (EGD) WITH PROPOFOL
Anesthesia: General

## 2023-11-18 MED ORDER — LIDOCAINE HCL (CARDIAC) PF 100 MG/5ML IV SOSY
PREFILLED_SYRINGE | INTRAVENOUS | Status: DC | PRN
  Administered 2023-11-18: 100 mg via INTRAVENOUS

## 2023-11-18 MED ORDER — NICOTINE 21 MG/24HR TD PT24
21.0000 mg | MEDICATED_PATCH | Freq: Every day | TRANSDERMAL | Status: DC
  Administered 2023-11-18 – 2023-11-19 (×2): 21 mg via TRANSDERMAL
  Filled 2023-11-18 (×2): qty 1

## 2023-11-18 MED ORDER — GLYCOPYRROLATE 0.2 MG/ML IJ SOLN
INTRAMUSCULAR | Status: DC | PRN
  Administered 2023-11-18: .2 mg via INTRAVENOUS

## 2023-11-18 MED ORDER — BUTALBITAL-APAP-CAFFEINE 50-325-40 MG PO TABS
1.0000 | ORAL_TABLET | Freq: Once | ORAL | Status: AC
  Administered 2023-11-18: 1 via ORAL
  Filled 2023-11-18: qty 1

## 2023-11-18 MED ORDER — LIDOCAINE HCL (PF) 2 % IJ SOLN
INTRAMUSCULAR | Status: AC
Start: 2023-11-18 — End: ?
  Filled 2023-11-18: qty 5

## 2023-11-18 MED ORDER — NICOTINE 21 MG/24HR TD PT24: 21.0000 mg | MEDICATED_PATCH | Freq: Every day | TRANSDERMAL | Status: DC

## 2023-11-18 MED ORDER — GLYCOPYRROLATE 0.2 MG/ML IJ SOLN
INTRAMUSCULAR | Status: AC
  Filled 2023-11-18: qty 1

## 2023-11-18 MED ORDER — PROPOFOL 500 MG/50ML IV EMUL
INTRAVENOUS | Status: DC | PRN
  Administered 2023-11-18: 125 ug/kg/min via INTRAVENOUS

## 2023-11-18 MED ORDER — DEXMEDETOMIDINE HCL IN NACL 80 MCG/20ML IV SOLN
INTRAVENOUS | Status: DC | PRN
Start: 2023-11-18 — End: 2023-11-18
  Administered 2023-11-18: 20 ug via INTRAVENOUS

## 2023-11-18 MED ORDER — LACTATED RINGERS IV BOLUS
1000.0000 mL | Freq: Once | INTRAVENOUS | Status: AC
  Administered 2023-11-18: 1000 mL via INTRAVENOUS

## 2023-11-18 MED ORDER — PROPOFOL 10 MG/ML IV BOLUS
INTRAVENOUS | Status: DC | PRN
  Administered 2023-11-18 (×2): 50 mg via INTRAVENOUS

## 2023-11-18 NOTE — Assessment & Plan Note (Signed)
-   This associated with chronic liver cirrhosis and liver functions have been fairly stable.

## 2023-11-18 NOTE — Progress Notes (Signed)
Patient introduced to role of nurse navigator. Intake questions completed.  Patient lives at home alone. Is able to drive to/from appointments. PCP is Dr. Graciela Husbands. Denies any SDOH needs. Does endorse alcohol use although he states he stopped "the day [he] came here." Refuses substance abuse referral or information. Encouraged to reach out to nurse navigator with any questions or desire to receive tx post hospitalization.

## 2023-11-18 NOTE — ED Notes (Signed)
Pt ambulated to the bathroom with steady gait.

## 2023-11-18 NOTE — Assessment & Plan Note (Signed)
-  The patient will be placed on CIWA protocol.

## 2023-11-18 NOTE — Anesthesia Preprocedure Evaluation (Signed)
Anesthesia Evaluation  Patient identified by MRN, date of birth, ID band Patient awake    Reviewed: Allergy & Precautions, NPO status , Patient's Chart, lab work & pertinent test results  History of Anesthesia Complications Negative for: history of anesthetic complications  Airway Mallampati: III  TM Distance: <3 FB Neck ROM: full    Dental  (+) Chipped   Pulmonary neg shortness of breath, sleep apnea , Current Smoker and Patient abstained from smoking.   Pulmonary exam normal        Cardiovascular Exercise Tolerance: Good hypertension, (-) angina (-) Past MI Normal cardiovascular exam     Neuro/Psych negative neurological ROS  negative psych ROS   GI/Hepatic Neg liver ROS,GERD  Controlled,,  Endo/Other  negative endocrine ROS    Renal/GU negative Renal ROS  negative genitourinary   Musculoskeletal   Abdominal   Peds  Hematology  (+) Blood dyscrasia, anemia   Anesthesia Other Findings Patient is NPO appropriate and reports no nausea or vomiting today.  Past Medical History: No date: Anginal pain (HCC) No date: Anxiety 11/26/2022: Basal cell carcinoma     Comment:  Superficial, left spinal upper back, clear with biopsy 11/26/2022: Basal cell carcinoma     Comment:  L post neck, clear with biopsy 05/29/2021: Basal cell carcinoma     Comment:  anterior neck - R anterolateral, exc at [facility] 06/26/2021: Basal cell carcinoma     Comment:  L med scapular back, exc at [facility] 06/12/2021: Basal cell carcinoma     Comment:  L post forearm, Rober Minion 06/12/2021: Basal cell carcinoma     Comment:  R scapular back, Rober Minion 06/12/2021: Basal cell carcinoma     Comment:  Central scapular back 12/04/2022: Basal cell carcinoma     Comment:  Left Upper Arm - Anterior, [facility] 12/04/2022: Basal cell carcinoma     Comment:  L upper chest inferior, [facility] 12/04/2022: Basal cell carcinoma     Comment:  left upper  chest lateral, [facility] 12/04/2022: Basal cell carcinoma     Comment:  left upper chest medial, [facility] 12/30/2022: Basal cell carcinoma     Comment:  R post neck, [facility] 04/01/2023: Basal cell carcinoma     Comment:  left mid chest, [facility] No date: Degenerative disc disease, cervical No date: Diverticulitis large intestine 11/26/2022: dysplastic nevus     Comment:  R spinal mid back, shave removal 12/30/22 No date: GERD (gastroesophageal reflux disease) No date: History of chest pain No date: Hyperlipidemia No date: Hypertension No date: Sleep apnea 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     Comment:  L post forearm, Rober Minion 11/26/2022: Squamous cell carcinoma of skin     Comment:  L upper mid back, in situ, clear with biopsy No date: Tobacco abuse  Past Surgical History: No date: APPENDECTOMY 06/17/2016: CARDIAC CATHETERIZATION; Left     Comment:  Procedure: Left Heart Cath and Coronary Angiography;                Surgeon: Iran Ouch, MD;  Location: [facility] INVASIVE               CV LAB;  Service: Cardiovascular;  Laterality: Left; No date: COLONOSCOPY 09/07/2015: COLONOSCOPY WITH PROPOFOL; N/A     Comment:  Procedure: COLONOSCOPY WITH PROPOFOL;  Surgeon: Wallace Cullens, MD;  Location: [facility] ENDOSCOPY;  Service:  Gastroenterology;  Laterality: N/A; 05/12/2018: COLONOSCOPY WITH PROPOFOL; N/A     Comment:  Procedure: COLONOSCOPY WITH PROPOFOL;  Surgeon: Wyline Mood, MD;  Location: [facility] ENDOSCOPY;  Service:               Gastroenterology;  Laterality: N/A; 02/26/2022: COLONOSCOPY WITH PROPOFOL; N/A     Comment:  Procedure: COLONOSCOPY WITH PROPOFOL;  Surgeon:               Napoleon Form, MD;  Location: [facility] ENDOSCOPY;                Service: Gastroenterology;  Laterality: N/A; 05/09/2023: ESOPHAGEAL BANDING     Comment:  Procedure: ESOPHAGEAL BANDING;  Surgeon: Stanton Kidney, MD;  Location: [facility] ENDOSCOPY;  Service:                Gastroenterology;; 11/16/2021: ESOPHAGOGASTRODUODENOSCOPY (EGD) WITH PROPOFOL; N/A     Comment:  Procedure: ESOPHAGOGASTRODUODENOSCOPY (EGD) WITH               PROPOFOL;  Surgeon: Jaynie Collins, DO;  Location:              [facility] ENDOSCOPY;  Service: Gastroenterology;  Laterality:               N/A; 02/26/2022: ESOPHAGOGASTRODUODENOSCOPY (EGD) WITH PROPOFOL; N/A     Comment:  Procedure: ESOPHAGOGASTRODUODENOSCOPY (EGD) WITH               PROPOFOL;  Surgeon: Napoleon Form, MD;  Location:               [facility] ENDOSCOPY;  Service: Gastroenterology;  Laterality:               N/A; 05/09/2023: ESOPHAGOGASTRODUODENOSCOPY (EGD) WITH PROPOFOL; N/A     Comment:  Procedure: ESOPHAGOGASTRODUODENOSCOPY (EGD) WITH               PROPOFOL;  Surgeon: Toledo, Boykin Nearing, MD;  Location:               [facility] ENDOSCOPY;  Service: Gastroenterology;  Laterality:               N/A; No date: HERNIA REPAIR 02/01/2021: IR RADIOLOGIST EVAL & MGMT 02/26/2022: POLYPECTOMY     Comment:  Procedure: POLYPECTOMY;  Surgeon: Napoleon Form,               MD;  Location: [facility] ENDOSCOPY;  Service: Gastroenterology;; 01/17/2021: VENTRAL HERNIA REPAIR; N/A     Comment:  Procedure: HERNIA REPAIR VENTRAL ADULT;  Surgeon: Leafy Ro, MD;  Location: [facility] ORS;  Service: General;                Laterality: N/A;  BMI    Body Mass Index: 32.08 kg/m      Reproductive/Obstetrics negative OB ROS                             Anesthesia Physical Anesthesia Plan  ASA: 4  Anesthesia Plan: General   Post-op Pain Management:    Induction: Intravenous  PONV Risk Score and Plan: Propofol infusion and TIVA  Airway Management Planned: Natural Airway and Nasal Cannula  Additional Equipment:   Intra-op Plan:  Post-operative Plan:   Informed Consent: I have reviewed the patients History and Physical, chart, labs and discussed the procedure including the risks, benefits and  alternatives for the proposed anesthesia with the patient or authorized representative who has indicated his/her understanding and acceptance.     Dental Advisory Given  Plan Discussed with: Anesthesiologist, CRNA and Surgeon  Anesthesia Plan Comments: (Patient consented for risks of anesthesia including but not limited to:  - adverse reactions to medications - risk of airway placement if required - damage to eyes, teeth, lips or other oral mucosa - nerve damage due to positioning  - sore throat or hoarseness - Damage to heart, brain, nerves, lungs, other parts of body or loss of life  Patient voiced understanding and assent.)       Anesthesia Quick Evaluation

## 2023-11-18 NOTE — Op Note (Signed)
[facility] Gastroenterology Patient Name: Omar Turner Procedure Date: 11/18/2023 10:59 AM MRN: [id number] Account #: [id number] Date of Birth: 1971-06-30 Admit Type: Inpatient Age: 53 Room: [facility] ENDO ROOM 4 Gender: Male Note Status: Finalized Instrument Name: Upper Endoscope [id number] Procedure:             Upper GI endoscopy Indications:           Hematemesis Providers:             Midge Minium MD, MD Referring MD:          No Local Md, MD (Referring MD) Medicines:             Propofol per Anesthesia Complications:         No immediate complications. Procedure:             Pre-Anesthesia Assessment:                        - Prior to the procedure, a History and Physical was                         performed, and patient medications and allergies were                         reviewed. The patient's tolerance of previous                         anesthesia was also reviewed. The risks and benefits                         of the procedure and the sedation options and risks                         were discussed with the patient. All questions were                         answered, and informed consent was obtained. Prior                         Anticoagulants: The patient has taken no anticoagulant                         or antiplatelet agents. ASA Grade Assessment: II - A                         patient with mild systemic disease. After reviewing                         the risks and benefits, the patient was deemed in                         satisfactory condition to undergo the procedure.                        After obtaining informed consent, the endoscope was                         passed under direct vision. Throughout the procedure,  the patient's blood pressure, pulse, and oxygen                         saturations were monitored continuously. The Endoscope                         was introduced through the mouth, and advanced to the                          second part of duodenum. The upper GI endoscopy was                         accomplished without difficulty. The patient tolerated                         the procedure well. Findings:      Grade II varices were found in the lower third of the esophagus. Four       bands were successfully placed with complete eradication, resulting in       deflation of varices. There was no bleeding during the procedure.      Portal hypertensive gastropathy was found in the entire examined stomach.      The examined duodenum was normal. Impression:            - Grade II esophageal varices. Completely eradicated.                         Banded.                        - Portal hypertensive gastropathy.                        - Normal examined duodenum.                        - No specimens collected. Recommendation:        - Return patient to hospital ward for ongoing care.                        - Clear liquid diet.                        - Continue present medications. Procedure Code(s):     --- Professional ---                        [phone], Esophagogastroduodenoscopy, flexible,                         transoral; with band ligation of esophageal/gastric                         varices Diagnosis Code(s):     --- Professional ---                        K92.0, Hematemesis                        I85.00, Esophageal varices without bleeding CPT copyright 2022 American Medical Association. All rights reserved. The codes documented in this report are  preliminary and upon coder review may  be revised to meet current compliance requirements. Midge Minium MD, MD 11/18/2023 11:22:50 AM This report has been signed electronically. Number of Addenda: 0 Note Initiated On: 11/18/2023 10:59 AM Estimated Blood Loss:  Estimated blood loss: none.      [facility]

## 2023-11-18 NOTE — Discharge Instructions (Addendum)
Your nurse navigator, Pegge Cumberledge, can be reached at [phone]     Intensive Outpatient Programs   [facility] [address] [place],  [place], [place] [phone]  [facility] (Inpatient and outpatient)[phone] (Suboxone and Methadone) [address] [phone]  [facility]: Alcohol [facility] Drug [facility] Programs - Intensive Outpatient [address] [place], [place] [address]  [phone]  [facility] (Outpatient, Inpatient, [facility] (Groups and Residental) (insurance only) [phone] [place], [place] [phone]   [facility] (for caregivers and family) [address] Laurell Josephs [address], [place], [place] [phone]  Residential Treatment Programs  [facility](2 years) Residential: 20 days)[facility] [address] [place], [place], [place] [phone] or [phone]  [facility] [address] [place], [place], [place] [phone]  [facility] ([facility]) [address] [place], [place], [place] [phone] Admissions: 8am-3pm M-F  [facility] Program: Residential Program [phone] Days)             [facility]: [facility]  [place], [place], [place]  [phone] or [phone] in Hours over the weekend or by referral)  [facility] [address] [place], [place] [address] [phone] (Do virtual or phone assessment, offer transportation within 25 miles, have in patient and Outpatient  options)   M[facility]il Crisis: [facility]:[phone] (for crisis response 24 hours a day)

## 2023-11-18 NOTE — Progress Notes (Signed)
  Progress Note   Patient: Omar Turner:[id number] DOB: June 01, 1971 DOA: 11/17/2023     1 DOS: the patient was seen and examined on 11/18/2023   Brief hospital course: The patient is a 53 yr old man who presented to [facility] ED with complaints of coffee ground emesis, melena, and weakness. His hemoglobin was 13.2 on admission, but had dropped to 10.7 by this morning. He was started on Ceftriaxone 1 gm IV daily, octroetide, and pantoprazole. His hemoglobin was monitored. He was taken to endoscopy on 11/18/2023. He was found to have grade II esophageal varices. 4 bands were placed with complete reduction of the varices. There was also hypertensive gastropathy present. The duodenum was clear. The patient was returned to his room. He has been given a clear liquid diet. Will continue pantoprazole and octreotide and monitor hemoglobin.   Assessment and Plan: * GI bleeding - The patient will be admitted to a medical telemetry bed. -  His hemoglobin was 13.2 on admission, but had dropped to 10.7 by the morning of 11/18/2023. Continue to monitor. - We will continue IV Protonix as well as IV octreotide infusion. - GI has taken the patient for endoscopy. This has revealed grade II esophageal varices. 4 bands were placed with complete reduction of the varices. There was also hypertensive gastropathy present. The duodenum was clear.  Alcohol abuse - The patient will be placed on CIWA protocol.  Duodenitis - Not present on EGD -GI consultation will be obtained as above.  Elevated LFTs - LFT's are a little higher than they were upon presentation. Continue to monitor. Likely due to alcoholic hepatitis.   Essential hypertension - The patient has had intermittent hypotension overnight. He is now normotensive on nadolol. Monitor and hold nadolol for any sustained hypotension.  Subjective: The patient is awake, alert, and oriented x 3. No acute distress.  Physical Exam: Vitals:   11/18/23 1128 11/18/23 1211  11/18/23 1240 11/18/23 1331  BP: 138/76 124/76 123/78 112/69  Pulse: 83 75 78 80  Resp: 14  16 18   Temp:    98.8 F (37.1 C)  TempSrc:    Oral  SpO2: 96% 99% 99% 96%  Weight:      Height:       Exam:  Constitutional:  The patient is awake, alert, and oriented x 3. No acute distress. Respiratory:  No increased work of breathing. No wheezes, rales, or rhonchi No tactile fremitus Cardiovascular:  Regular rate and rhythm No murmurs, ectopy, or gallups. No lateral PMI. No thrills. Abdomen:  Abdomen is soft, non-tender, non-distended No hernias, masses, or organomegaly Normoactive bowel sounds.  Musculoskeletal:  No cyanosis, clubbing, or edema Skin:  No rashes, lesions, ulcers palpation of skin: no induration or nodules Neurologic:  CN 2-12 intact Sensation all 4 extremities intact Psychiatric:  Mental status Mood, affect appropriate Orientation to person, place, time  judgment and insight appear intact  Family Communication: None available  Disposition: Status is: Inpatient Remains inpatient appropriate because: S/P GI bleed with banding of varices and a 2.5 gram drop in hemoglobin since presentation.  Planned Discharge Destination: Home    Time spent: 34 minutes  Author: Elridge Stemm, DO 11/18/2023 2:31 PM  For on call review [email or web address].

## 2023-11-18 NOTE — Progress Notes (Signed)
Contacted on call provider via secure chat as follows:  Hello. This patient is a CIWA patient and has been complaining of severe headache. Interventions haven't helped. Can we have an order for something that would help a 10/10 headache please.  New order received for fiorcet.

## 2023-11-18 NOTE — Consult Note (Signed)
Midge Minium, MD [facility]  [address], [address] [place], [place] [address] Phone: [phone] Fax : [phone]  Consultation  Referring Provider:     Dr. Arville Care Primary Care Physician:  Lynnea Ferrier, MD Primary Gastroenterologist:  Dr. Tobi Bastos         Reason for Consultation:     Hematemesis with rectal bleeding  Date of Admission:  11/17/2023 Date of Consultation:  11/18/2023         HPI:   Omar Turner is a 53 y.o. male with a long history of alcohol abuse.  The patient reports that he drinks 1/4-1/2 a bottle of vodka per day.  The patient has had multiple upper endoscopies in the past including banding of esophageal varices.  The patient's last upper endoscopy was in August 2024 by Dr. Norma Fredrickson and at that time he had esophageal banding.  He was also noted to have hypertensive portal gastropathy.  The patient reports that he has been having hematemesis with melena and bright red blood per rectum for the last 8 days.  He had a CT scan of the abdomen that showed:  IMPRESSION: 1. Duodenal wall thickening with paraduodenal hazy fat stranding. Findings favor duodenitis the groove pancreatitis could appear similarly. 2. Proctitis. 3. Wall thickening about the colon. This may be due to decompression, mild colitis, or congestive colopathy 4. Cirrhosis with sequela of portal hypertension including perigastric varices. Heterogenous perfusion of the liver. Question peripherally enhancing exophytic lesion in the inferior right hepatic lobe measuring 3.5 cm. Recommend further evaluation with nonemergent MRI of the abdomen with and without contrast. 5.  Aortic Atherosclerosis  He reports that after his last admission he had stopped drinking for some time but restarted drinking a month ago.  The patient's labs have shown:  Component     Latest Ref Rng 05/11/2023 11/17/2023 11/18/2023  Albumin     3.5 - 5.0 g/dL 2.8 (L)  2.9 (L)  2.6 (L)   AST     15 - 41 U/L 141 (H)  190 (H)  219 (H)    ALT     0 - 44 U/L 92 (H)  98 (H)  104 (H)   Alkaline Phosphatase     38 - 126 U/L 137 (H)  138 (H)  116   Total Bilirubin     0.0 - 1.2 mg/dL 3.6 (H)  5.3 (H)  6.4 (H)    He has also been found to have thrombocytopenia with his platelets being 68 today and was 80 on admission.   Past Medical History:  Diagnosis Date   Anginal pain (HCC)    Anxiety    Basal cell carcinoma 11/26/2022   Superficial, left spinal upper back, clear with biopsy   Basal cell carcinoma 11/26/2022   L post neck, clear with biopsy   Basal cell carcinoma 05/29/2021   anterior neck - R anterolateral, exc at [facility]   Basal cell carcinoma 06/26/2021   L med scapular back, exc at [facility] cell carcinoma 06/12/2021   L post forearm, Rober Minion   Basal cell carcinoma 06/12/2021   R scapular back, Rober Minion   Basal cell carcinoma 06/12/2021   Central scapular back   Basal cell carcinoma 12/04/2022   Left Upper Arm - Anterior, [facility]   Basal cell carcinoma 12/04/2022   L upper chest inferior, [facility]   Basal cell carcinoma 12/04/2022   left upper chest lateral, [facility]  Basal cell carcinoma 12/04/2022   left upper chest medial, [facility]   Basal cell carcinoma 12/30/2022   R post neck, [facility]   Basal cell carcinoma 04/01/2023   left mid chest, [facility]   Degenerative disc disease, cervical    Diverticulitis large intestine    dysplastic nevus 11/26/2022   R spinal mid back, shave removal 12/30/22   GERD (gastroesophageal reflux disease)    History of chest pain    Hyperlipidemia    Hypertension    Sleep apnea    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     Past Surgical History:  Procedure Laterality Date   APPENDECTOMY     CARDIAC CATHETERIZATION Left 06/17/2016   Procedure: Left Heart Cath and Coronary Angiography;  Surgeon: Iran Ouch, MD;  Location:  [facility] INVASIVE CV LAB;  Service: Cardiovascular;  Laterality: Left;   COLONOSCOPY     COLONOSCOPY WITH PROPOFOL N/A 09/07/2015   Procedure: COLONOSCOPY WITH PROPOFOL;  Surgeon: Wallace Cullens, MD;  Location: [facility] ENDOSCOPY;  Service: Gastroenterology;  Laterality: N/A;   COLONOSCOPY WITH PROPOFOL N/A 05/12/2018   Procedure: COLONOSCOPY WITH PROPOFOL;  Surgeon: Wyline Mood, MD;  Location: [facility] ENDOSCOPY;  Service: Gastroenterology;  Laterality: N/A;   COLONOSCOPY WITH PROPOFOL N/A 02/26/2022   Procedure: COLONOSCOPY WITH PROPOFOL;  Surgeon: Napoleon Form, MD;  Location: [facility] ENDOSCOPY;  Service: Gastroenterology;  Laterality: N/A;   ESOPHAGEAL BANDING  05/09/2023   Procedure: ESOPHAGEAL BANDING;  Surgeon: Norma Fredrickson, Boykin Nearing, MD;  Location: [facility] ENDOSCOPY;  Service: Gastroenterology;;   ESOPHAGOGASTRODUODENOSCOPY (EGD) WITH PROPOFOL N/A 11/16/2021   Procedure: ESOPHAGOGASTRODUODENOSCOPY (EGD) WITH PROPOFOL;  Surgeon: Jaynie Collins, DO;  Location: [facility] ENDOSCOPY;  Service: Gastroenterology;  Laterality: N/A;   ESOPHAGOGASTRODUODENOSCOPY (EGD) WITH PROPOFOL N/A 02/26/2022   Procedure: ESOPHAGOGASTRODUODENOSCOPY (EGD) WITH PROPOFOL;  Surgeon: Napoleon Form, MD;  Location: [facility] ENDOSCOPY;  Service: Gastroenterology;  Laterality: N/A;   ESOPHAGOGASTRODUODENOSCOPY (EGD) WITH PROPOFOL N/A 05/09/2023   Procedure: ESOPHAGOGASTRODUODENOSCOPY (EGD) WITH PROPOFOL;  Surgeon: Toledo, Boykin Nearing, MD;  Location: [facility] ENDOSCOPY;  Service: Gastroenterology;  Laterality: N/A;   HERNIA REPAIR     IR RADIOLOGIST EVAL & MGMT  02/01/2021   POLYPECTOMY  02/26/2022   Procedure: POLYPECTOMY;  Surgeon: Napoleon Form, MD;  Location: [facility] ENDOSCOPY;  Service: Gastroenterology;;   VENTRAL HERNIA REPAIR N/A 01/17/2021   Procedure: HERNIA REPAIR VENTRAL ADULT;  Surgeon: Leafy Ro, MD;  Location: [facility] ORS;  Service: General;  Laterality: N/A;    Prior to Admission medications   Medication Sig Start Date End Date Taking?  Authorizing Provider  albuterol (VENTOLIN HFA) 108 (90 Base) MCG/ACT inhaler Inhale 1-2 puffs into the lungs every 6 (six) hours as needed.   Yes [provider]  cetirizine (ZYRTEC) 10 MG tablet Take 10 mg by mouth daily.   Yes [provider]  furosemide (LASIX) 20 MG tablet Take 1 tablet (20 mg total) by mouth daily. 05/11/23  Yes Lurene Shadow, MD  nadolol (CORGARD) 40 MG tablet Take 1 tablet (40 mg total) by mouth daily. 05/11/23  Yes Lurene Shadow, MD  pantoprazole (PROTONIX) 40 MG tablet Take 1 tablet (40 mg total) by mouth daily. 05/11/23  Yes Lurene Shadow, MD  potassium chloride (KLOR-CON) 10 MEQ tablet Take 10 mEq by mouth daily. 08/22/23  Yes [provider]  predniSONE (  DELTASONE) 20 MG tablet Take 20 mg by mouth 2 (two) times daily. 07/23/23  Yes [provider]  spironolactone (ALDACTONE) 100 MG tablet Take 1 tablet (100 mg total) by mouth daily. 05/11/23  Yes Lurene Shadow, MD  azithromycin (ZITHROMAX) 250 MG tablet Take 2 tablets PO day 1, then 1 tab PO daily x4 days. Patient not taking: Reported on 09/02/2023 06/23/23   Ivery Quale, PA-C  ferrous sulfate 324 MG TBEC Take 1 tablet (324 mg total) by mouth every other day. Patient not taking: Reported on 11/18/2023 05/11/23   Lurene Shadow, MD  folic acid (FOLVITE) 1 MG tablet Take 1 tablet (1 mg total) by mouth daily. Patient not taking: Reported on 11/18/2023 05/11/23   Lurene Shadow, MD    Family History  Problem Relation Age of Onset   Hypertension Father    Heart disease Father        CABG x 3 & valve replacement.    Heart attack Father      Social History   Tobacco Use   Smoking status: Every Day    Current packs/day: 1.50    Average packs/day: 1.5 packs/day for 15.0 years (22.5 ttl pk-yrs)    Types: Cigarettes   Smokeless tobacco: Never  Vaping Use   Vaping status: Never Used  Substance Use Topics   Alcohol use: Yes    Alcohol/week: 14.0 standard drinks of alcohol    Types: 14  Shots of liquor per week    Comment: per day   Drug use: No    Allergies as of 11/17/2023 - Review Complete 11/17/2023  Allergen Reaction Noted   Chantix [varenicline]  09/06/2015   Lexapro [escitalopram]  09/06/2015    Review of Systems:    All systems reviewed and negative except where noted in HPI.   Physical Exam:  Vital signs in last 24 hours: Temp:  [98.2 F (36.8 C)-98.3 F (36.8 C)] 98.3 F (36.8 C) (02/11 0841) Pulse Rate:  [87-107] 87 (02/11 0835) Resp:  [14-20] 15 (02/11 0915) BP: (78-116)/(49-83) 97/72 (02/11 0915) SpO2:  [93 %-100 %] 95 % (02/11 0915) Weight:  [104.3 kg] 104.3 kg (02/10 1904)   General:   Pleasant, cooperative in NAD Head:  Normocephalic and atraumatic. Eyes:   No icterus.   Conjunctiva pink. PERRLA. Ears:  Normal auditory acuity. Neck:  Supple; no masses or thyroidomegaly Lungs: Respirations even and unlabored. Lungs clear to auscultation bilaterally.   No wheezes, crackles, or rhonchi.  Heart:  Regular rate and rhythm;  Without murmur, clicks, rubs or gallops Abdomen:  Soft, nondistended, nontender. Normal bowel sounds. No appreciable masses or hepatomegaly.  No rebound or guarding.  Rectal:  Not performed. Msk:  Symmetrical without gross deformities.    Extremities:  Without edema, cyanosis or clubbing. Neurologic:  Alert and oriented x3;  grossly normal neurologically. Skin:  Intact without significant lesions or rashes. Cervical Nodes:  No significant cervical adenopathy. Psych:  Alert and cooperative. Normal affect.  LAB RESULTS: Recent Labs    11/17/23 1911 11/17/23 2315 11/18/23 0543  WBC 11.2*  --  11.8*  HGB 13.2 12.5* 10.7*  HCT 36.7* 36.0* 29.5*  PLT 80*  --  68*   BMET Recent Labs    11/17/23 1911 11/18/23 0543  NA 131* 130*  K 4.0 4.0  CL 96* 94*  CO2 17* 23  GLUCOSE 117* 108*  BUN 20 28*  CREATININE 0.63 0.60*  CALCIUM 8.1* 7.6*   LFT Recent Labs    11/18/23 0543  PROT 6.1*  ALBUMIN 2.6*  AST 219*   ALT 104*  ALKPHOS 116  BILITOT 6.4*   PT/INR No results for input(s): "LABPROT", "INR" in the last 72 hours.  STUDIES: CT ABDOMEN PELVIS W CONTRAST Result Date: 11/18/2023 CLINICAL DATA:  Right lower quadrant abdominal pain. Vomiting blood for 3-4 days. Blood in stool. Nausea, dizziness, shortness of breath EXAM: CT ABDOMEN AND PELVIS WITH CONTRAST TECHNIQUE: Multidetector CT imaging of the abdomen and pelvis was performed using the standard protocol following bolus administration of intravenous contrast. RADIATION DOSE REDUCTION: This exam was performed according to the departmental dose-optimization program which includes automated exposure control, adjustment of the mA and/or kV according to patient size and/or use of iterative reconstruction technique. CONTRAST:  OMNIPAQUE IOHEXOL 300 MG/ML  SOLN COMPARISON:  CT abdomen and pelvis 07/10/2023 FINDINGS: Lower chest: No acute abnormality. Hepatobiliary: Nodular contour of the liver compatible with cirrhosis. Heterogenous enhancement throughout the liver likely due to perfusional anomalies in the setting of cirrhosis. Question peripherally enhancing exophytic lesion in the inferior right hepatic lobe measuring 3.5 x 3.1 cm (series 2/image 36). Unremarkable gallbladder.  No biliary dilation. Pancreas: No ductal dilation. Mild haziness of the fat in the duodenal groove. Spleen: Unremarkable. Adrenals/Urinary Tract: Stable adrenal glands and kidneys. No urinary calculi or hydronephrosis. Unremarkable bladder. Stomach/Bowel: Normal caliber large and small bowel. Bowel wall thickening and inflammatory fat stranding about the rectum. Decompressed colon with questionable wall thickening. Stomach is within normal limits. Duodenal wall thickening with Peri duodenal hazy fat stranding. Appendectomy. Vascular/Lymphatic: Portal vein is patent. Perigastric varices in the left upper quadrant. Aortic atherosclerosis. No enlarged abdominal or pelvic lymph nodes.  Reproductive: No acute abnormality. Other: No free intraperitoneal air. No abscess. Stranding and fluid in the right anterior pararenal space and right pericolic gutter. Musculoskeletal: No acute fracture. IMPRESSION: 1. Duodenal wall thickening with paraduodenal hazy fat stranding. Findings favor duodenitis the groove pancreatitis could appear similarly. 2. Proctitis. 3. Wall thickening about the colon. This may be due to decompression, mild colitis, or congestive colopathy 4. Cirrhosis with sequela of portal hypertension including perigastric varices. Heterogenous perfusion of the liver. Question peripherally enhancing exophytic lesion in the inferior right hepatic lobe measuring 3.5 cm. Recommend further evaluation with nonemergent MRI of the abdomen with and without contrast. 5.  Aortic Atherosclerosis (ICD10-I70.0). Electronically Signed   By: Minerva Fester M.D.   On: 11/18/2023 00:35      Impression / Plan:   Assessment: Principal Problem:   GI bleeding Active Problems:   Elevated LFTs   Alcohol abuse   Essential hypertension   Duodenitis   Omar Turner is a 53 y.o. y/o male with with a history of longstanding alcohol abuse and varices with portal hypertensive gastropathy with multiple EGDs in the past.  The patient has signs of acute hepatitis with elevated bilirubin and AST higher than the ALT.  The patient has been having vomiting and had a CT scan showing findings consistent with possible duodenal wall thickening and proctitis.  Plan:  The patient will be brought up to the endoscopy unit today for an EGD.  The patient has also been told to stop his alcohol abuse.  Further recommendations will depend on the patient's findings at the upper endoscopy.  The patient has been explained the plan and agrees with it.  PPI IV twice daily  Continue serial CBCs and transfuse PRN Avoid NSAIDs Maintain 2 large-bore IV lines Please page GI with any acute hemodynamic changes, or signs of  active GI bleeding   Thank you for involving me in the care of this patient.      LOS: 1 day   Midge Minium, MD, MD. Clementeen Graham 11/18/2023, 9:25 AM,  Pager [phone] 7am-5pm  Check AMION for 5pm -7am coverage and on weekends   Note: This dictation was prepared with Dragon dictation along with smaller phrase technology. Any transcriptional errors that result from this process are unintentional.

## 2023-11-18 NOTE — Assessment & Plan Note (Addendum)
-   The patient will be on IV PPI therapy as mentioned above. -GI consultation will be obtained as above.

## 2023-11-18 NOTE — Anesthesia Postprocedure Evaluation (Signed)
Anesthesia Post Note  Patient: Omar Turner  Procedure(s) Performed: ESOPHAGOGASTRODUODENOSCOPY (EGD) WITH PROPOFOL ESOPHAGEAL BANDING  Patient location during evaluation: Endoscopy Anesthesia Type: General Level of consciousness: awake and alert Pain management: pain level controlled Vital Signs Assessment: post-procedure vital signs reviewed and stable Respiratory status: spontaneous breathing, nonlabored ventilation, respiratory function stable and patient connected to nasal cannula oxygen Cardiovascular status: blood pressure returned to baseline and stable Postop Assessment: no apparent nausea or vomiting Anesthetic complications: no   No notable events documented.   Last Vitals:  Vitals:   11/18/23 1211 11/18/23 1240  BP: 124/76 123/78  Pulse: 75 78  Resp:  16  Temp:    SpO2: 99% 99%    Last Pain:  Vitals:   11/18/23 1128  TempSrc:   PainSc: 9                  Clemons Salvucci K Meaghann Choo

## 2023-11-18 NOTE — ED Notes (Signed)
Previous shift RN had given pt water. This RN remove water from pt bedside.

## 2023-11-18 NOTE — Transfer of Care (Signed)
Immediate Anesthesia Transfer of Care Note  Patient: Omar Turner  Procedure(s) Performed: ESOPHAGOGASTRODUODENOSCOPY (EGD) WITH PROPOFOL ESOPHAGEAL BANDING  Patient Location: PACU  Anesthesia Type:General  Level of Consciousness: sedated  Airway & Oxygen Therapy: Patient Spontanous Breathing  Post-op Assessment: Report given to RN and Post -op Vital signs reviewed and stable  Post vital signs: Reviewed and stable  Last Vitals:  Vitals Value Taken Time  BP    Temp    Pulse    Resp    SpO2      Last Pain:  Vitals:   11/18/23 1036  TempSrc: Temporal  PainSc:          Complications: No notable events documented.

## 2023-11-18 NOTE — Assessment & Plan Note (Addendum)
-   The patient be admitted to a medical telemetry bed. - We will follow serial hemoglobins. - We will continue IV Protonix as well as IV octreotide infusion. - GI consult to be obtained. - I notified Dr. Mia Creek about the patient. - Duodenitis may explain upper GI bleeding and proctitis lower GI bleeding.

## 2023-11-18 NOTE — Assessment & Plan Note (Signed)
-   We will continue antihypertensive therapy.

## 2023-11-18 NOTE — TOC Progression Note (Signed)
Transition of Care [facility]) - Progression Note    Patient Details  Name: Omar Turner MRN: [id number] Date of Birth: [birth year]  [facility] CM/SW Contact  Tory Emerald, [place] Phone Number: 11/18/2023, 12:01 PM  Clinical Narrative:     CSW reviewed [facility] consult and placed SU resources in pt's AVS. Consult is closed.       Expected Discharge Plan and Services                                               Social Determinants of Health (SDOH) Interventions SDOH Screenings   Food Insecurity: No Food Insecurity (05/08/2023)  Housing: Low Risk  (05/08/2023)  Transportation Needs: No Transportation Needs (05/08/2023)  Utilities: Not At Risk (05/08/2023)  Financial Resource Strain: Low Risk  (12/11/2020)   Received from [facility], [facility]  Tobacco Use: High Risk (11/18/2023)    Readmission Risk Interventions     No data to display

## 2023-11-19 ENCOUNTER — Encounter: Payer: Self-pay | Admitting: Gastroenterology

## 2023-11-19 DIAGNOSIS — K769 Liver disease, unspecified: Secondary | ICD-10-CM

## 2023-11-19 DIAGNOSIS — K922 Gastrointestinal hemorrhage, unspecified: Secondary | ICD-10-CM

## 2023-11-19 DIAGNOSIS — D62 Acute posthemorrhagic anemia: Secondary | ICD-10-CM

## 2023-11-19 DIAGNOSIS — K921 Melena: Secondary | ICD-10-CM | POA: Diagnosis not present

## 2023-11-19 DIAGNOSIS — K7031 Alcoholic cirrhosis of liver with ascites: Secondary | ICD-10-CM

## 2023-11-19 DIAGNOSIS — I8511 Secondary esophageal varices with bleeding: Secondary | ICD-10-CM

## 2023-11-19 LAB — CBC
HCT: 28.1 % — ABNORMAL LOW (ref 39.0–52.0)
HCT: 29.4 % — ABNORMAL LOW (ref 39.0–52.0)
Hemoglobin: 10.1 g/dL — ABNORMAL LOW (ref 13.0–17.0)
Hemoglobin: 10.6 g/dL — ABNORMAL LOW (ref 13.0–17.0)
MCH: 34.9 pg — ABNORMAL HIGH (ref 26.0–34.0)
MCH: 35.8 pg — ABNORMAL HIGH (ref 26.0–34.0)
MCHC: 35.9 g/dL (ref 30.0–36.0)
MCHC: 36.1 g/dL — ABNORMAL HIGH (ref 30.0–36.0)
MCV: 96.7 fL (ref 80.0–100.0)
MCV: 99.6 fL (ref 80.0–100.0)
Platelets: 55 10*3/uL — ABNORMAL LOW (ref 150–400)
Platelets: 73 10*3/uL — ABNORMAL LOW (ref 150–400)
RBC: 2.82 MIL/uL — ABNORMAL LOW (ref 4.22–5.81)
RBC: 3.04 MIL/uL — ABNORMAL LOW (ref 4.22–5.81)
RDW: 15.4 % (ref 11.5–15.5)
RDW: 15.5 % (ref 11.5–15.5)
WBC: 7.1 10*3/uL (ref 4.0–10.5)
WBC: 8.1 10*3/uL (ref 4.0–10.5)
nRBC: 0 % (ref 0.0–0.2)
nRBC: 0 % (ref 0.0–0.2)

## 2023-11-19 LAB — BASIC METABOLIC PANEL
Anion gap: 8 (ref 5–15)
BUN: 18 mg/dL (ref 6–20)
CO2: 25 mmol/L (ref 22–32)
Calcium: 7.5 mg/dL — ABNORMAL LOW (ref 8.9–10.3)
Chloride: 101 mmol/L (ref 98–111)
Creatinine, Ser: 0.77 mg/dL (ref 0.61–1.24)
GFR, Estimated: >60 mL/min (ref >60)
Glucose, Bld: 107 mg/dL — ABNORMAL HIGH (ref 70–99)
Potassium: 3.8 mmol/L (ref 3.5–5.1)
Sodium: 134 mmol/L — ABNORMAL LOW (ref 135–145)

## 2023-11-19 LAB — MAGNESIUM: Magnesium: 1.8 mg/dL (ref 1.7–2.4)

## 2023-11-19 MED ORDER — MAGNESIUM SULFATE 2 GM/50ML IV SOLN
2.0000 g | Freq: Once | INTRAVENOUS | Status: AC
  Administered 2023-11-19: 2 g via INTRAVENOUS
  Filled 2023-11-19: qty 50

## 2023-11-19 NOTE — Plan of Care (Signed)
Patient appears to have left AMA with 2-PIV's intact, IVF found dripping on the floor, patient nor his belongings nowhere to be found; MD notified

## 2023-11-19 NOTE — Plan of Care (Signed)
  Problem: Education: Goal: Knowledge of General Education information will improve Description: Including pain rating scale, medication(s)/side effects and non-pharmacologic comfort measures 11/19/2023 1522 by Harrietta Guardian, RN Outcome: Progressing 11/19/2023 1522 by Harrietta Guardian, RN Outcome: Progressing   Problem: Health Behavior/Discharge Planning: Goal: Ability to manage health-related needs will improve 11/19/2023 1522 by Harrietta Guardian, RN Outcome: Progressing 11/19/2023 1522 by Harrietta Guardian, RN Outcome: Progressing   Problem: Clinical Measurements: Goal: Ability to maintain clinical measurements within normal limits will improve 11/19/2023 1522 by Harrietta Guardian, RN Outcome: Progressing 11/19/2023 1522 by Harrietta Guardian, RN Outcome: Progressing Goal: Will remain free from infection 11/19/2023 1522 by Harrietta Guardian, RN Outcome: Progressing 11/19/2023 1522 by Harrietta Guardian, RN Outcome: Progressing Goal: Diagnostic test results will improve 11/19/2023 1522 by Harrietta Guardian, RN Outcome: Progressing 11/19/2023 1522 by Harrietta Guardian, RN Outcome: Progressing Goal: Respiratory complications will improve 11/19/2023 1522 by Harrietta Guardian, RN Outcome: Progressing 11/19/2023 1522 by Harrietta Guardian, RN Outcome: Progressing Goal: Cardiovascular complication will be avoided 11/19/2023 1522 by Harrietta Guardian, RN Outcome: Progressing 11/19/2023 1522 by Harrietta Guardian, RN Outcome: Progressing   Problem: Activity: Goal: Risk for activity intolerance will decrease 11/19/2023 1522 by Harrietta Guardian, RN Outcome: Progressing 11/19/2023 1522 by Harrietta Guardian, RN Outcome: Progressing   Problem: Nutrition: Goal: Adequate nutrition will be maintained 11/19/2023 1522 by Harrietta Guardian, RN Outcome: Progressing 11/19/2023 1522 by Harrietta Guardian, RN Outcome: Progressing   Problem: Coping: Goal: Level of anxiety will decrease 11/19/2023 1522 by Harrietta Guardian, RN Outcome:  Progressing 11/19/2023 1522 by Harrietta Guardian, RN Outcome: Progressing   Problem: Elimination: Goal: Will not experience complications related to bowel motility 11/19/2023 1522 by Harrietta Guardian, RN Outcome: Progressing 11/19/2023 1522 by Harrietta Guardian, RN Outcome: Progressing Goal: Will not experience complications related to urinary retention 11/19/2023 1522 by Harrietta Guardian, RN Outcome: Progressing 11/19/2023 1522 by Harrietta Guardian, RN Outcome: Progressing   Problem: Pain Managment: Goal: General experience of comfort will improve and/or be controlled 11/19/2023 1522 by Harrietta Guardian, RN Outcome: Progressing 11/19/2023 1522 by Harrietta Guardian, RN Outcome: Progressing   Problem: Safety: Goal: Ability to remain free from injury will improve 11/19/2023 1522 by Harrietta Guardian, RN Outcome: Progressing 11/19/2023 1522 by Harrietta Guardian, RN Outcome: Progressing   Problem: Skin Integrity: Goal: Risk for impaired skin integrity will decrease 11/19/2023 1522 by Harrietta Guardian, RN Outcome: Progressing 11/19/2023 1522 by Harrietta Guardian, RN Outcome: Progressing

## 2023-11-19 NOTE — Plan of Care (Signed)

## 2023-11-19 NOTE — Progress Notes (Addendum)
Progress Note   Patient: Omar Turner:[id number] DOB: 08-Jul-1971 DOA: 11/17/2023     2 DOS: the patient was seen and examined on 11/19/2023   Brief hospital course: The patient is a 53 yr old man who presented to [facility] ED with complaints of coffee ground emesis, melena, and weakness. His hemoglobin was 13.2 on admission, but had dropped to 10.7 by this morning. He was started on Ceftriaxone 1 gm IV daily, octroetide, and pantoprazole. His hemoglobin was monitored. He was taken to endoscopy on 11/18/2023. He was found to have grade II esophageal varices. 4 bands were placed with complete reduction of the varices. There was also hypertensive gastropathy present. The duodenum was clear. The patient was returned to his room. He has been given a clear liquid diet. Will continue pantoprazole and octreotide and monitor hemoglobin.       Assessment and Plan: Acute blood loss anemia * GI bleeding - Secondary to upper GI bleed as patient presented for evaluation of coffee-ground emesis as well as passage of melena stools with epigastric and right upper quadrant pain. He is status post upper endoscopy which showed grade II esophageal varices. 4 bands were placed with complete reduction of the varices. There was also hypertensive gastropathy present. The duodenum was clear. Continue octreotide drip as well as IV PPI Continue to monitor H&H as patient states that he is still having melena stools.  So far H&H is stable. Continue nadolol and will start spironolactone if blood pressure can tolerate.    Alcohol abuse with withdrawal symptoms -Complains of visual hallucinations.  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  Has tremors in his hands  Physical Exam: Vitals:   11/18/23 2324 11/19/23 0037 11/19/23 0541 11/19/23 0800  BP: 99/64 99/64 111/79 100/71  Pulse: 81 81 85 85  Resp:   17 18  Temp:  98.1 F (36.7 C) 98.5 F (36.9 C) 98.5 F (36.9 C)  TempSrc:  Oral  Oral  SpO2:  95% 97% 93%  Weight:      Height:       Constitutional:  The patient is awake, alert, and oriented x 3. No acute distress. Respiratory:  No increased work of breathing. No wheezes, rales, or rhonchi No tactile fremitus Cardiovascular:  Regular rate and rhythm No murmurs, ectopy, or gallups. No lateral PMI. No thrills. Abdomen:  Abdomen is soft, non-tender, non-distended No hernias, masses, or organomegaly Normoactive bowel sounds.  Musculoskeletal:  No cyanosis, clubbing, or edema Tremors in his hands Skin:  No rashes, lesions, ulcers palpation of skin: no induration or nodules Neurologic:  CN 2-12 intact Sensation all 4 extremities intact Psychiatric:  Mental status Mood, affect appropriate Orientation to person, place, time  judgment and insight appear intact     Data Reviewed: Labs reviewed.  H&H is stable.  Magnesium 1.8 There are no new results to review at this time.  Family Communication:  Plan of care discussed with patient in detail at the bedside.  He verbalizes understanding and agrees with the plan.  Disposition: Status is: Inpatient Remains inpatient appropriate because: Has symptoms of alcohol withdrawal  Planned Discharge Destination: Home    Time spent: 40 minutes  Author: Lucile Shutters, MD 11/19/2023 12:04 PM  For on call  review [email or web address].

## 2023-11-19 NOTE — Progress Notes (Signed)
Midge Minium, MD [facility]   [address] [place], [place] [address] Phone: [phone] Fax : [phone]   Subjective: The patient reports that he is still having some passage of his old dark stools.  He has had no further hematemesis.  The patient did have imaging that showed a lesion on his liver and a MRI was recommended.  The patient is tolerating clear liquid diet.   Objective: Vital signs in last 24 hours: Vitals:   11/19/23 0037 11/19/23 0541 11/19/23 0800 11/19/23 1250  BP: 99/64 111/79 100/71 108/73  Pulse: 81 85 85 77  Resp:  17 18 18   Temp: 98.1 F (36.7 C) 98.5 F (36.9 C) 98.5 F (36.9 C) (!) 97.5 F (36.4 C)  TempSrc: Oral  Oral Oral  SpO2: 95% 97% 93% 98%  Weight:      Height:       Weight change:   Intake/Output Summary (Last 24 hours) at 11/19/2023 1325 Last data filed at 11/19/2023 0900 Gross per 24 hour  Intake 1125.09 ml  Output 650 ml  Net 475.09 ml     Exam: Heart:: Regular rate and rhythm or without murmur or extra heart sounds Lungs: normal and clear to auscultation and percussion Abdomen: soft, nontender, normal bowel sounds   Lab Results: @LABTEST2 @ Micro Results: No results found for this or any previous visit (from the past 240 hours). Studies/Results: CT ABDOMEN PELVIS W CONTRAST Result Date: 11/18/2023 CLINICAL DATA:  Right lower quadrant abdominal pain. Vomiting blood for 3-4 days. Blood in stool. Nausea, dizziness, shortness of breath EXAM: CT ABDOMEN AND PELVIS WITH CONTRAST TECHNIQUE: Multidetector CT imaging of the abdomen and pelvis was performed using the standard protocol following bolus administration of intravenous contrast. RADIATION DOSE REDUCTION: This exam was performed according to the departmental dose-optimization program which includes automated exposure control, adjustment of the mA and/or kV according to patient size and/or use of iterative reconstruction technique. CONTRAST:  OMNIPAQUE IOHEXOL 300  MG/ML  SOLN COMPARISON:  CT abdomen and pelvis 07/10/2023 FINDINGS: Lower chest: No acute abnormality. Hepatobiliary: Nodular contour of the liver compatible with cirrhosis. Heterogenous enhancement throughout the liver likely due to perfusional anomalies in the setting of cirrhosis. Question peripherally enhancing exophytic lesion in the inferior right hepatic lobe measuring 3.5 x 3.1 cm (series 2/image 36). Unremarkable gallbladder.  No biliary dilation. Pancreas: No ductal dilation. Mild haziness of the fat in the duodenal groove. Spleen: Unremarkable. Adrenals/Urinary Tract: Stable adrenal glands and kidneys. No urinary calculi or hydronephrosis. Unremarkable bladder. Stomach/Bowel: Normal caliber large and small bowel. Bowel wall thickening and inflammatory fat stranding about the rectum. Decompressed colon with questionable wall thickening. Stomach is within normal limits. Duodenal wall thickening with Peri duodenal hazy fat stranding. Appendectomy. Vascular/Lymphatic: Portal vein is patent. Perigastric varices in the left upper quadrant. Aortic atherosclerosis. No enlarged abdominal or pelvic lymph nodes. Reproductive: No acute abnormality. Other: No free intraperitoneal air. No abscess. Stranding and fluid in the right anterior pararenal space and right pericolic gutter. Musculoskeletal: No acute fracture. IMPRESSION: 1. Duodenal wall thickening with paraduodenal hazy fat stranding. Findings favor duodenitis the groove pancreatitis could appear similarly. 2. Proctitis. 3. Wall thickening about the colon. This may be due to decompression, mild colitis, or congestive colopathy 4. Cirrhosis with sequela of portal hypertension including perigastric varices. Heterogenous perfusion of the liver. Question peripherally enhancing exophytic lesion in the inferior right hepatic lobe measuring 3.5 cm. Recommend further evaluation with nonemergent MRI of the abdomen with and without  contrast. 5.  Aortic Atherosclerosis  (ICD10-I70.0). Electronically Signed   By: Minerva Fester M.D.   On: 11/18/2023 00:35   Medications: I have reviewed the patient's current medications. Scheduled Meds:  ferrous sulfate  324 mg Oral QODAY   folic acid  1 mg Oral Daily   loratadine  10 mg Oral Daily   multivitamin with minerals  1 tablet Oral Daily   nadolol  40 mg Oral Daily   nicotine  21 mg Transdermal Daily   pantoprazole (PROTONIX) IV  40 mg Intravenous Q12H   thiamine  100 mg Oral Daily   Or   thiamine  100 mg Intravenous Daily   Continuous Infusions:  magnesium sulfate bolus IVPB     octreotide (SANDOSTATIN) 500 mcg in sodium chloride 0.9 % 250 mL (2 mcg/mL) infusion 50 mcg/hr (11/19/23 1006)   PRN Meds:.acetaminophen **OR** acetaminophen, LORazepam **OR** LORazepam, ondansetron **OR** ondansetron (ZOFRAN) IV, traZODone   Assessment: Principal Problem:   GI bleeding Active Problems:   Elevated LFTs   Alcohol abuse   Essential hypertension   Duodenitis   Acute upper GI bleeding   Secondary esophageal varices without bleeding (HCC)    Plan: The patient had banding of his esophageal varices done yesterday without any further signs of GI bleeding.  Patient's hemoglobin has been stable.  The patient is supposed to have a MRI for his liver lesion.  Nothing further to do from a GI point of view and the patient has been told that he should refrain from further alcohol use.  I will sign off.  Please call if any further GI concerns or questions.  We would like to thank you for the opportunity to participate in the care of Omar Turner.    LOS: 2 days   Midge Minium, MD.FACG 11/19/2023, 1:25 PM Pager [phone] 7am-5pm  Check AMION for 5pm -7am coverage and on weekends

## 2023-11-20 DIAGNOSIS — D62 Acute posthemorrhagic anemia: Secondary | ICD-10-CM | POA: Diagnosis present

## 2023-11-20 NOTE — Discharge Summary (Addendum)
Physician Discharge Summary   Patient: Omar Turner MRN: [id number] DOB: 1971/04/22  Admit date:     11/17/2023  Discharge date: 11/19/2023  Discharge Physician: Jenah Vanasten   PCP: Lynnea Ferrier, MD   Recommendations at discharge:   Patient signed out AGAINST MEDICAL ADVICE  Discharge Diagnoses: Principal Problem:   ABLA (acute blood loss anemia) Active Problems:   Alcohol abuse   GI bleeding   Duodenitis   Elevated LFTs   Essential hypertension   Secondary esophageal varices without bleeding (HCC)   Esophageal varices with bleeding in diseases classified elsewhere [facility])  Resolved Problems:   * No resolved hospital problems. *  Hospital Course: Omar Turner is a 53 y.o. male with medical history significant for alcohol abuse, anxiety, essential hypertension, GERD, dyslipidemia, OSA and tobacco abuse, who presented to the emergency room with acute onset of coffee-ground emesis with nausea as well as diarrhea with melena as well as bright red bleeding per rectum which have been intermittent over the last 8 days.  He admits to associated epigastric and right upper quadrant abdominal pain.  He admitted to occasional dyspnea and cough that is similar to his smoker's cough with occasional wheezing.  He denied any chest pain or palpitations.  He has been having diminished urine output.  No dysuria, oliguria or hematuria or flank pain.  No other bleeding diathesis.   ED Course: When the patient came to the ER, vital signs were within normal and later on heart rate was 101.  Labs revealed hyponatremia and hypochloremia as well as CO2 of 17 with calcium 8.1 and anion gap 18.  Alk phos was slightly elevated 138 with an albumin of 2.9 total protein of 5.3, AST of 198 ALT of 98 with serum lipase of 59.  Total bili was 5.3.  High sensitive troponin I was 16 and later 9.  CBC showed leukocytosis of 11.2 with hemoglobin of 13 point hematocrit 36.7.  Repeat H&H were 12.5 and 36.  Blood  group was O+ with negative antibody screen. EKG as reviewed by me : EKG showed normal sinus rhythm with a rate of 94 with T wave inversion inferiorly. Imaging: 2 view chest x-ray showed no acute cardiopulmonary disease.. Abdominal pelvic CT scan with contrast revealed the following: 1. Duodenal wall thickening with paraduodenal hazy fat stranding. Findings favor duodenitis the groove pancreatitis could appear similarly. 2. Proctitis. 3. Wall thickening about the colon. This may be due to decompression, mild colitis, or congestive colopathy 4. Cirrhosis with sequela of portal hypertension including perigastric varices. Heterogenous perfusion of the liver. Question peripherally enhancing exophytic lesion in the inferior right hepatic lobe measuring 3.5 cm. Recommend further evaluation with nonemergent MRI of the abdomen with and without contrast. 5.  Aortic Atherosclerosis.   The patient was given IV Protonix 40 mg, IV octreotide 50 mcg and 1 L bolus of IV lactated ringer as well as 1 g of IV Rocephin.  He is placed on CIWA protocol.  The patient will be admitted to a medical telemetry bed for further evaluation and management.      Assessment and Plan:  Acute blood loss anemia * GI bleeding - Secondary to upper GI bleed as patient presented for evaluation of coffee-ground emesis as well as passage of melena stools with epigastric and right upper quadrant pain. He is status post upper endoscopy which showed grade II esophageal varices. 4 bands were placed with complete reduction of the varices. There was also hypertensive gastropathy present.  The duodenum was clear. Patient was treated with octreotide drip as well as IV PPI Despite having dark stools his H&H remained stable prior to patient signing out AGAINST MEDICAL ADVICE. He was placed on nadolol with plans to start spironolactone if blood pressure can tolerate.       Alcohol abuse with withdrawal symptoms -Complains of visual  hallucinations.  History of heavy alcohol use with high risk of withdrawal symptoms - Placed on CIWA protocol and administer lorazepam for CIWA score of 8 or greater     Duodenitis - Not present on EGD -GI consultation appreciated     Alcohol liver disease Elevated LFTs Liver cirrhosis with portal hypertension - Secondary to alcoholic liver disease with evidence of portal hypertension -Continue nadolol and will start spironolactone if blood pressure tolerates       Pseudo hypocalcemia Secondary to low albumin Calcium levels are within normal limits     Hyponatremia Secondary to alcohol abuse Improved with IV fluid hydration    Right hepatic lesion Noted on CT scan of abdomen and pelvis done on admission with recommendations for nonemergent MRI.  Patient signed out AGAINST MEDICAL ADVICE prior to getting procedure done. Called and left a voicemail for patient to follow-up with his primary care provider so he can get the MRI of his liver done for further evaluation.         Consultants: Gastroenterology Procedures performed: Upper endoscopy with banding of esophageal varices Disposition:  Patient signed out AGAINST MEDICAL ADVICE Diet recommendation:  Clear liquid diet DISCHARGE MEDICATION: Allergies as of 11/19/2023       Reactions   Chantix [varenicline]    Lexapro [escitalopram]            Medication List     ASK your doctor about these medications    albuterol 108 (90 Base) MCG/ACT inhaler Commonly known as: VENTOLIN HFA Inhale 1-2 puffs into the lungs every 6 (six) hours as needed.   azithromycin 250 MG tablet Commonly known as: ZITHROMAX Take 2 tablets PO day 1, then 1 tab PO daily x4 days.   cetirizine 10 MG tablet Commonly known as: ZYRTEC Take 10 mg by mouth daily.   ferrous sulfate 324 MG Tbec Take 1 tablet (324 mg total) by mouth every other day.   folic acid 1 MG tablet Commonly known as: FOLVITE Take 1 tablet (1 mg total) by mouth  daily.   furosemide 20 MG tablet Commonly known as: LASIX Take 1 tablet (20 mg total) by mouth daily.   nadolol 40 MG tablet Commonly known as: CORGARD Take 1 tablet (40 mg total) by mouth daily.   pantoprazole 40 MG tablet Commonly known as: PROTONIX Take 1 tablet (40 mg total) by mouth daily.   potassium chloride 10 MEQ tablet Commonly known as: KLOR-CON Take 10 mEq by mouth daily.   predniSONE 20 MG tablet Commonly known as: DELTASONE Take 20 mg by mouth 2 (two) times daily.   spironolactone 100 MG tablet Commonly known as: ALDACTONE Take 1 tablet (100 mg total) by mouth daily.        Discharge Exam: Filed Weights   11/17/23 1904  Weight: 104.3 kg   Signed out AGAINST MEDICAL ADVICE  Condition at discharge:  Signed out AGAINST MEDICAL ADVICE  The results of significant diagnostics from this hospitalization (including imaging, microbiology, ancillary and laboratory) are listed below for reference.   Imaging Studies: CT ABDOMEN PELVIS W CONTRAST Result Date: 11/18/2023 CLINICAL DATA:  Right lower quadrant abdominal pain.  Vomiting blood for 3-4 days. Blood in stool. Nausea, dizziness, shortness of breath EXAM: CT ABDOMEN AND PELVIS WITH CONTRAST TECHNIQUE: Multidetector CT imaging of the abdomen and pelvis was performed using the standard protocol following bolus administration of intravenous contrast. RADIATION DOSE REDUCTION: This exam was performed according to the departmental dose-optimization program which includes automated exposure control, adjustment of the mA and/or kV according to patient size and/or use of iterative reconstruction technique. CONTRAST:  OMNIPAQUE IOHEXOL 300 MG/ML  SOLN COMPARISON:  CT abdomen and pelvis 07/10/2023 FINDINGS: Lower chest: No acute abnormality. Hepatobiliary: Nodular contour of the liver compatible with cirrhosis. Heterogenous enhancement throughout the liver likely due to perfusional anomalies in the setting of cirrhosis.  Question peripherally enhancing exophytic lesion in the inferior right hepatic lobe measuring 3.5 x 3.1 cm (series 2/image 36). Unremarkable gallbladder.  No biliary dilation. Pancreas: No ductal dilation. Mild haziness of the fat in the duodenal groove. Spleen: Unremarkable. Adrenals/Urinary Tract: Stable adrenal glands and kidneys. No urinary calculi or hydronephrosis. Unremarkable bladder. Stomach/Bowel: Normal caliber large and small bowel. Bowel wall thickening and inflammatory fat stranding about the rectum. Decompressed colon with questionable wall thickening. Stomach is within normal limits. Duodenal wall thickening with Peri duodenal hazy fat stranding. Appendectomy. Vascular/Lymphatic: Portal vein is patent. Perigastric varices in the left upper quadrant. Aortic atherosclerosis. No enlarged abdominal or pelvic lymph nodes. Reproductive: No acute abnormality. Other: No free intraperitoneal air. No abscess. Stranding and fluid in the right anterior pararenal space and right pericolic gutter. Musculoskeletal: No acute fracture. IMPRESSION: 1. Duodenal wall thickening with paraduodenal hazy fat stranding. Findings favor duodenitis the groove pancreatitis could appear similarly. 2. Proctitis. 3. Wall thickening about the colon. This may be due to decompression, mild colitis, or congestive colopathy 4. Cirrhosis with sequela of portal hypertension including perigastric varices. Heterogenous perfusion of the liver. Question peripherally enhancing exophytic lesion in the inferior right hepatic lobe measuring 3.5 cm. Recommend further evaluation with nonemergent MRI of the abdomen with and without contrast. 5.  Aortic Atherosclerosis (ICD10-I70.0). Electronically Signed   By: Minerva Fester M.D.   On: 11/18/2023 00:35    Microbiology: Results for orders placed or performed during the hospital encounter of 05/08/23  MRSA Next Gen by PCR, Nasal     Status: None   Collection Time: 05/08/23  9:55 PM   Specimen:  Nasal Mucosa; Nasal Swab  Result Value Ref Range Status   MRSA by PCR Next Gen NOT DETECTED NOT DETECTED Final    Comment: (NOTE) The GeneXpert MRSA Assay (FDA approved for NASAL specimens only), is one component of a comprehensive MRSA colonization surveillance program. It is not intended to diagnose MRSA infection nor to guide or monitor treatment for MRSA infections. Test performance is not FDA approved in patients less than 83 years old. Performed at [facility], [address] [place], [place] [address]     Labs: CBC: Recent Labs  Lab 11/17/23 1911 11/17/23 2315 11/18/23 0543 11/18/23 1406 11/19/23 0009 11/19/23 0829  WBC 11.2*  --  11.8*  --  7.1 8.1  HGB 13.2 12.5* 10.7* 10.5* 10.1* 10.6*  HCT 36.7* 36.0* 29.5* 29.9* 28.1* 29.4*  MCV 96.8  --  96.1  --  99.6 96.7  PLT 80*  --  68*  --  55* 73*   Basic Metabolic Panel: Recent Labs  Lab 11/17/23 1911 11/18/23 0543 11/19/23 0505 11/19/23 0829  NA 131* 130* 134*  --   K 4.0 4.0 3.8  --  CL 96* 94* 101  --   CO2 17* 23 25  --   GLUCOSE 117* 108* 107*  --   BUN 20 28* 18  --   CREATININE 0.63 0.60* 0.77  --   CALCIUM 8.1* 7.6* 7.5*  --   MG  --   --   --  1.8   Liver Function Tests: Recent Labs  Lab 11/17/23 1911 11/18/23 0543  AST 190* 219*  ALT 98* 104*  ALKPHOS 138* 116  BILITOT 5.3* 6.4*  PROT 6.6 6.1*  ALBUMIN 2.9* 2.6*   CBG: No results for input(s): "GLUCAP" in the last 168 hours.  Discharge time spent: less than 30 minutes.  Signed: Lucile Shutters, MD [facility] 11/20/2023

## 2023-11-28 ENCOUNTER — Other Ambulatory Visit: Payer: Self-pay | Admitting: Gastroenterology

## 2023-11-28 DIAGNOSIS — K7031 Alcoholic cirrhosis of liver with ascites: Secondary | ICD-10-CM

## 2023-11-28 DIAGNOSIS — K7689 Other specified diseases of liver: Secondary | ICD-10-CM

## 2023-11-28 DIAGNOSIS — I8510 Secondary esophageal varices without bleeding: Secondary | ICD-10-CM

## 2023-12-01 ENCOUNTER — Other Ambulatory Visit: Payer: Self-pay | Admitting: Gastroenterology

## 2023-12-01 ENCOUNTER — Ambulatory Visit
Admission: RE | Admit: 2023-12-01 | Discharge: 2023-12-01 | Disposition: A | Discharge status: Home / Self Care | Payer: BC Managed Care – PPO | Source: Ambulatory Visit | Attending: Gastroenterology | Admitting: Gastroenterology

## 2023-12-01 ENCOUNTER — Other Ambulatory Visit
Admission: RE | Admit: 2023-12-01 | Discharge: 2023-12-01 | Disposition: A | Discharge status: Home / Self Care | Payer: BC Managed Care – PPO | Source: Ambulatory Visit | Attending: Gastroenterology | Admitting: Gastroenterology

## 2023-12-01 VITALS — BP 119/75 | HR 97 | Resp 14

## 2023-12-01 DIAGNOSIS — K769 Liver disease, unspecified: Secondary | ICD-10-CM

## 2023-12-01 DIAGNOSIS — I8510 Secondary esophageal varices without bleeding: Secondary | ICD-10-CM

## 2023-12-01 DIAGNOSIS — K7031 Alcoholic cirrhosis of liver with ascites: Secondary | ICD-10-CM

## 2023-12-01 DIAGNOSIS — K7689 Other specified diseases of liver: Secondary | ICD-10-CM

## 2023-12-01 DIAGNOSIS — K625 Hemorrhage of anus and rectum: Secondary | ICD-10-CM

## 2023-12-01 LAB — BODY FLUID CELL COUNT WITH DIFFERENTIAL
Eos, Fluid: 0 %
Lymphs, Fluid: 39 %
Monocyte-Macrophage-Serous Fluid: 36 %
Neutrophil Count, Fluid: 25 %
Total Nucleated Cell Count, Fluid: 185 uL

## 2023-12-01 LAB — CBC
HCT: 33.2 % — ABNORMAL LOW (ref 39.0–52.0)
Hemoglobin: 12.3 g/dL — ABNORMAL LOW (ref 13.0–17.0)
MCH: 35.9 pg — ABNORMAL HIGH (ref 26.0–34.0)
MCHC: 37 g/dL — ABNORMAL HIGH (ref 30.0–36.0)
MCV: 96.8 fL (ref 80.0–100.0)
Platelets: 228 10*3/uL (ref 150–400)
RBC: 3.43 MIL/uL — ABNORMAL LOW (ref 4.22–5.81)
RDW: 16 % — ABNORMAL HIGH (ref 11.5–15.5)
WBC: 11.2 10*3/uL — ABNORMAL HIGH (ref 4.0–10.5)
nRBC: 0 % (ref 0.0–0.2)

## 2023-12-01 LAB — ALBUMIN, PLEURAL OR PERITONEAL FLUID: Albumin, Fluid: <1.5 g/dL

## 2023-12-01 LAB — PROTEIN, PLEURAL OR PERITONEAL FLUID: Total protein, fluid: <3 g/dL

## 2023-12-01 LAB — SODIUM: Sodium: 130 mmol/L — ABNORMAL LOW (ref 135–145)

## 2023-12-01 LAB — ALBUMIN: Albumin: 2.7 g/dL — ABNORMAL LOW (ref 3.5–5.0)

## 2023-12-01 MED ORDER — ALBUMIN HUMAN 25 % IV SOLN
25.0000 g | Freq: Once | INTRAVENOUS | Status: AC
  Administered 2023-12-01: 25 g via INTRAVENOUS

## 2023-12-01 MED ORDER — ALBUMIN HUMAN 25 % IV SOLN
INTRAVENOUS | Status: AC
  Filled 2023-12-01: qty 100

## 2023-12-01 MED ORDER — LIDOCAINE HCL (PF) 1 % IJ SOLN
10.0000 mL | Freq: Once | INTRAMUSCULAR | Status: AC
  Administered 2023-12-01: 10 mL via INTRADERMAL
  Filled 2023-12-01: qty 10

## 2023-12-01 NOTE — Procedures (Signed)
 PROCEDURE SUMMARY:  Successful US guided paracentesis from right lateral abdomen.  Yielded 5.6 liters of clear yellow fluid.  No immediate complications.  Patient tolerated well.  EBL = trace  Specimen sent for labs.  Lessie Funderburke Charmian Muff PA-C 12/01/2023 10:03 AM

## 2023-12-02 ENCOUNTER — Ambulatory Visit
Admission: RE | Admit: 2023-12-02 | Discharge: 2023-12-02 | Disposition: A | Discharge status: Home / Self Care | Payer: BC Managed Care – PPO | Source: Ambulatory Visit | Attending: Gastroenterology | Admitting: Gastroenterology

## 2023-12-02 DIAGNOSIS — I8510 Secondary esophageal varices without bleeding: Secondary | ICD-10-CM | POA: Diagnosis present

## 2023-12-02 DIAGNOSIS — K7031 Alcoholic cirrhosis of liver with ascites: Secondary | ICD-10-CM | POA: Insufficient documentation

## 2023-12-02 DIAGNOSIS — K7689 Other specified diseases of liver: Secondary | ICD-10-CM | POA: Diagnosis present

## 2023-12-02 DIAGNOSIS — K769 Liver disease, unspecified: Secondary | ICD-10-CM | POA: Insufficient documentation

## 2023-12-02 DIAGNOSIS — K7030 Alcoholic cirrhosis of liver without ascites: Secondary | ICD-10-CM | POA: Insufficient documentation

## 2023-12-02 LAB — CYTOLOGY - NON PAP

## 2023-12-02 MED ORDER — GADOBUTROL 1 MMOL/ML IV SOLN
10.0000 mL | Freq: Once | INTRAVENOUS | Status: AC | PRN
  Administered 2023-12-02: 10 mL via INTRAVENOUS

## 2023-12-04 ENCOUNTER — Other Ambulatory Visit: Payer: Self-pay | Admitting: Gastroenterology

## 2023-12-04 DIAGNOSIS — K7031 Alcoholic cirrhosis of liver with ascites: Secondary | ICD-10-CM

## 2023-12-05 LAB — BODY FLUID CULTURE W GRAM STAIN

## 2023-12-08 ENCOUNTER — Ambulatory Visit
Admission: RE | Admit: 2023-12-08 | Discharge: 2023-12-08 | Disposition: A | Discharge status: Home / Self Care | Payer: BC Managed Care – PPO | Source: Ambulatory Visit | Attending: Gastroenterology | Admitting: Gastroenterology

## 2023-12-08 DIAGNOSIS — K7031 Alcoholic cirrhosis of liver with ascites: Secondary | ICD-10-CM | POA: Diagnosis present

## 2023-12-08 MED ORDER — LIDOCAINE HCL (PF) 1 % IJ SOLN
20.0000 mL | Freq: Once | INTRAMUSCULAR | Status: AC
  Administered 2023-12-08: 20 mL via INTRADERMAL
  Filled 2023-12-08: qty 20

## 2023-12-08 MED ORDER — ALBUMIN HUMAN 25 % IV SOLN
40.0000 g | Freq: Once | INTRAVENOUS | Status: AC
Start: 2023-12-08 — End: 2023-12-08
  Administered 2023-12-08: 40 g via INTRAVENOUS

## 2023-12-08 MED ORDER — ALBUMIN HUMAN 25 % IV SOLN
INTRAVENOUS | Status: AC
  Filled 2023-12-08: qty 200

## 2023-12-08 NOTE — Procedures (Signed)
 PROCEDURE SUMMARY:  Successful ultrasound guided paracentesis from the right lower quadrant.  Yielded 5.7 L of clear yellow fluid.  No immediate complications.  The patient tolerated the procedure well.   Specimen not sent for labs.  EBL < 5mL  The patient has required >/=2 paracenteses in a 30 day period and a screening evaluation by the [facility] has been arranged.  Alwyn Ren, AGACNP-BC 12/08/2023, 2:24 PM

## 2023-12-16 ENCOUNTER — Encounter: Payer: Self-pay | Admitting: Internal Medicine

## 2023-12-17 ENCOUNTER — Emergency Department

## 2023-12-17 ENCOUNTER — Encounter: Payer: Self-pay | Admitting: Emergency Medicine

## 2023-12-17 ENCOUNTER — Ambulatory Visit: Admission: RE | Admit: 2023-12-17 | Source: Home / Self Care | Admitting: Internal Medicine

## 2023-12-17 ENCOUNTER — Other Ambulatory Visit: Payer: Self-pay

## 2023-12-17 ENCOUNTER — Inpatient Hospital Stay
Admission: EM | Admit: 2023-12-17 | Discharge: 2023-12-26 | DRG: 433 | Disposition: A | Discharge status: Home / Self Care | Attending: Internal Medicine | Admitting: Internal Medicine

## 2023-12-17 DIAGNOSIS — Z85828 Personal history of other malignant neoplasm of skin: Secondary | ICD-10-CM

## 2023-12-17 DIAGNOSIS — F10139 Alcohol abuse with withdrawal, unspecified: Secondary | ICD-10-CM | POA: Diagnosis present

## 2023-12-17 DIAGNOSIS — E876 Hypokalemia: Secondary | ICD-10-CM

## 2023-12-17 DIAGNOSIS — K7682 Hepatic encephalopathy: Secondary | ICD-10-CM | POA: Insufficient documentation

## 2023-12-17 DIAGNOSIS — K219 Gastro-esophageal reflux disease without esophagitis: Secondary | ICD-10-CM | POA: Diagnosis present

## 2023-12-17 DIAGNOSIS — I8510 Secondary esophageal varices without bleeding: Secondary | ICD-10-CM | POA: Diagnosis present

## 2023-12-17 DIAGNOSIS — E878 Other disorders of electrolyte and fluid balance, not elsewhere classified: Secondary | ICD-10-CM

## 2023-12-17 DIAGNOSIS — E871 Hypo-osmolality and hyponatremia: Secondary | ICD-10-CM | POA: Diagnosis not present

## 2023-12-17 DIAGNOSIS — I110 Hypertensive heart disease with heart failure: Secondary | ICD-10-CM | POA: Diagnosis present

## 2023-12-17 DIAGNOSIS — I4891 Unspecified atrial fibrillation: Secondary | ICD-10-CM

## 2023-12-17 DIAGNOSIS — I10 Essential (primary) hypertension: Secondary | ICD-10-CM

## 2023-12-17 DIAGNOSIS — K2980 Duodenitis without bleeding: Secondary | ICD-10-CM | POA: Diagnosis not present

## 2023-12-17 DIAGNOSIS — K3189 Other diseases of stomach and duodenum: Secondary | ICD-10-CM | POA: Diagnosis present

## 2023-12-17 DIAGNOSIS — I5032 Chronic diastolic (congestive) heart failure: Secondary | ICD-10-CM

## 2023-12-17 DIAGNOSIS — K766 Portal hypertension: Secondary | ICD-10-CM | POA: Diagnosis present

## 2023-12-17 DIAGNOSIS — E785 Hyperlipidemia, unspecified: Secondary | ICD-10-CM | POA: Diagnosis present

## 2023-12-17 DIAGNOSIS — K7040 Alcoholic hepatic failure without coma: Secondary | ICD-10-CM | POA: Diagnosis present

## 2023-12-17 DIAGNOSIS — F17210 Nicotine dependence, cigarettes, uncomplicated: Secondary | ICD-10-CM | POA: Diagnosis present

## 2023-12-17 DIAGNOSIS — K7031 Alcoholic cirrhosis of liver with ascites: Secondary | ICD-10-CM | POA: Diagnosis not present

## 2023-12-17 DIAGNOSIS — R1084 Generalized abdominal pain: Secondary | ICD-10-CM | POA: Diagnosis present

## 2023-12-17 DIAGNOSIS — E669 Obesity, unspecified: Secondary | ICD-10-CM | POA: Diagnosis present

## 2023-12-17 DIAGNOSIS — I2510 Atherosclerotic heart disease of native coronary artery without angina pectoris: Secondary | ICD-10-CM | POA: Diagnosis present

## 2023-12-17 DIAGNOSIS — D649 Anemia, unspecified: Secondary | ICD-10-CM | POA: Diagnosis present

## 2023-12-17 DIAGNOSIS — F1010 Alcohol abuse, uncomplicated: Secondary | ICD-10-CM | POA: Diagnosis not present

## 2023-12-17 DIAGNOSIS — R197 Diarrhea, unspecified: Secondary | ICD-10-CM | POA: Diagnosis present

## 2023-12-17 DIAGNOSIS — Z720 Tobacco use: Secondary | ICD-10-CM | POA: Diagnosis not present

## 2023-12-17 DIAGNOSIS — D696 Thrombocytopenia, unspecified: Secondary | ICD-10-CM | POA: Diagnosis not present

## 2023-12-17 DIAGNOSIS — Z86008 Personal history of in-situ neoplasm of other site: Secondary | ICD-10-CM

## 2023-12-17 DIAGNOSIS — Z79899 Other long term (current) drug therapy: Secondary | ICD-10-CM

## 2023-12-17 DIAGNOSIS — D6959 Other secondary thrombocytopenia: Secondary | ICD-10-CM | POA: Diagnosis present

## 2023-12-17 DIAGNOSIS — G4733 Obstructive sleep apnea (adult) (pediatric): Secondary | ICD-10-CM | POA: Diagnosis present

## 2023-12-17 DIAGNOSIS — I959 Hypotension, unspecified: Secondary | ICD-10-CM | POA: Diagnosis not present

## 2023-12-17 DIAGNOSIS — I700 Atherosclerosis of aorta: Secondary | ICD-10-CM | POA: Diagnosis present

## 2023-12-17 DIAGNOSIS — Z716 Tobacco abuse counseling: Secondary | ICD-10-CM

## 2023-12-17 DIAGNOSIS — I5A Non-ischemic myocardial injury (non-traumatic): Secondary | ICD-10-CM

## 2023-12-17 DIAGNOSIS — R109 Unspecified abdominal pain: Secondary | ICD-10-CM | POA: Diagnosis not present

## 2023-12-17 DIAGNOSIS — Z7952 Long term (current) use of systemic steroids: Secondary | ICD-10-CM

## 2023-12-17 DIAGNOSIS — Z6832 Body mass index (BMI) 32.0-32.9, adult: Secondary | ICD-10-CM

## 2023-12-17 DIAGNOSIS — Z8249 Family history of ischemic heart disease and other diseases of the circulatory system: Secondary | ICD-10-CM

## 2023-12-17 LAB — CBC
HCT: 32.9 % — ABNORMAL LOW (ref 39.0–52.0)
Hemoglobin: 12 g/dL — ABNORMAL LOW (ref 13.0–17.0)
MCH: 35.6 pg — ABNORMAL HIGH (ref 26.0–34.0)
MCHC: 36.5 g/dL — ABNORMAL HIGH (ref 30.0–36.0)
MCV: 97.6 fL (ref 80.0–100.0)
Platelets: 79 10*3/uL — ABNORMAL LOW (ref 150–400)
RBC: 3.37 MIL/uL — ABNORMAL LOW (ref 4.22–5.81)
RDW: 14.6 % (ref 11.5–15.5)
WBC: 9.3 10*3/uL (ref 4.0–10.5)
nRBC: 0 % (ref 0.0–0.2)

## 2023-12-17 LAB — URINALYSIS, ROUTINE W REFLEX MICROSCOPIC
Bilirubin Urine: NEGATIVE
Glucose, UA: NEGATIVE mg/dL
Hgb urine dipstick: NEGATIVE
Ketones, ur: 5 mg/dL — AB
Leukocytes,Ua: NEGATIVE
Nitrite: NEGATIVE
Protein, ur: NEGATIVE mg/dL
Specific Gravity, Urine: 1.03 (ref 1.005–1.030)
pH: 5 (ref 5.0–8.0)

## 2023-12-17 LAB — COMPREHENSIVE METABOLIC PANEL
ALT: 83 U/L — ABNORMAL HIGH (ref 0–44)
AST: 269 U/L — ABNORMAL HIGH (ref 15–41)
Albumin: 2.8 g/dL — ABNORMAL LOW (ref 3.5–5.0)
Alkaline Phosphatase: 200 U/L — ABNORMAL HIGH (ref 38–126)
Anion gap: 15 (ref 5–15)
BUN: 9 mg/dL (ref 6–20)
CO2: 20 mmol/L — ABNORMAL LOW (ref 22–32)
Calcium: 7.7 mg/dL — ABNORMAL LOW (ref 8.9–10.3)
Chloride: 89 mmol/L — ABNORMAL LOW (ref 98–111)
Creatinine, Ser: 0.88 mg/dL (ref 0.61–1.24)
GFR, Estimated: >60 mL/min (ref >60)
Glucose, Bld: 111 mg/dL — ABNORMAL HIGH (ref 70–99)
Potassium: 3 mmol/L — ABNORMAL LOW (ref 3.5–5.1)
Sodium: 124 mmol/L — ABNORMAL LOW (ref 135–145)
Total Bilirubin: 6.2 mg/dL — ABNORMAL HIGH (ref 0.0–1.2)
Total Protein: 6.8 g/dL (ref 6.5–8.1)

## 2023-12-17 LAB — PROTIME-INR
INR: 1.3 — ABNORMAL HIGH (ref 0.8–1.2)
Prothrombin Time: 16.4 s — ABNORMAL HIGH (ref 11.4–15.2)

## 2023-12-17 LAB — TSH: TSH: 2.682 u[IU]/mL (ref 0.350–4.500)

## 2023-12-17 LAB — LIPASE, BLOOD: Lipase: 82 U/L — ABNORMAL HIGH (ref 11–51)

## 2023-12-17 LAB — TROPONIN I (HIGH SENSITIVITY): Troponin I (High Sensitivity): 20 ng/L — ABNORMAL HIGH (ref <18)

## 2023-12-17 LAB — T4, FREE: Free T4: 1.17 ng/dL — ABNORMAL HIGH (ref 0.61–1.12)

## 2023-12-17 LAB — MAGNESIUM: Magnesium: 1.6 mg/dL — ABNORMAL LOW (ref 1.7–2.4)

## 2023-12-17 SURGERY — COLONOSCOPY
Anesthesia: General

## 2023-12-17 MED ORDER — POTASSIUM CHLORIDE CRYS ER 20 MEQ PO TBCR
40.0000 meq | EXTENDED_RELEASE_TABLET | Freq: Once | ORAL | Status: AC
  Administered 2023-12-18: 40 meq via ORAL
  Filled 2023-12-17: qty 2

## 2023-12-17 MED ORDER — THIAMINE HCL 100 MG/ML IJ SOLN
100.0000 mg | Freq: Every day | INTRAMUSCULAR | Status: DC
  Filled 2023-12-17 (×2): qty 2

## 2023-12-17 MED ORDER — METOPROLOL TARTRATE 5 MG/5ML IV SOLN
5.0000 mg | Freq: Once | INTRAVENOUS | Status: AC
  Administered 2023-12-17: 5 mg via INTRAVENOUS
  Filled 2023-12-17: qty 5

## 2023-12-17 MED ORDER — CHLORDIAZEPOXIDE HCL 25 MG PO CAPS
25.0000 mg | ORAL_CAPSULE | Freq: Once | ORAL | Status: AC
  Administered 2023-12-18: 25 mg via ORAL
  Filled 2023-12-17: qty 1

## 2023-12-17 MED ORDER — POTASSIUM CHLORIDE CRYS ER 20 MEQ PO TBCR
40.0000 meq | EXTENDED_RELEASE_TABLET | Freq: Once | ORAL | Status: AC
  Administered 2023-12-17: 40 meq via ORAL
  Filled 2023-12-17: qty 2

## 2023-12-17 MED ORDER — SODIUM CHLORIDE 1 G PO TABS
1.0000 g | ORAL_TABLET | Freq: Two times a day (BID) | ORAL | Status: DC
  Administered 2023-12-18 – 2023-12-26 (×17): 1 g via ORAL
  Filled 2023-12-17 (×17): qty 1

## 2023-12-17 MED ORDER — IOHEXOL 350 MG/ML SOLN
100.0000 mL | Freq: Once | INTRAVENOUS | Status: AC | PRN
  Administered 2023-12-17: 100 mL via INTRAVENOUS

## 2023-12-17 MED ORDER — MAGNESIUM SULFATE 2 GM/50ML IV SOLN
2.0000 g | Freq: Once | INTRAVENOUS | Status: AC
  Administered 2023-12-17: 2 g via INTRAVENOUS
  Filled 2023-12-17: qty 50

## 2023-12-17 MED ORDER — LORAZEPAM 1 MG PO TABS
1.0000 mg | ORAL_TABLET | ORAL | Status: AC | PRN
  Administered 2023-12-19 – 2023-12-20 (×4): 1 mg via ORAL
  Filled 2023-12-17 (×5): qty 1

## 2023-12-17 MED ORDER — THIAMINE MONONITRATE 100 MG PO TABS
100.0000 mg | ORAL_TABLET | Freq: Every day | ORAL | Status: DC
  Administered 2023-12-18 – 2023-12-26 (×9): 100 mg via ORAL
  Filled 2023-12-17 (×10): qty 1

## 2023-12-17 MED ORDER — ALBUTEROL SULFATE (2.5 MG/3ML) 0.083% IN NEBU: 2.5000 mg | INHALATION_SOLUTION | RESPIRATORY_TRACT | Status: DC | PRN

## 2023-12-17 MED ORDER — HYDRALAZINE HCL 20 MG/ML IJ SOLN: 5.0000 mg | INTRAMUSCULAR | Status: DC | PRN

## 2023-12-17 MED ORDER — METOPROLOL TARTRATE 5 MG/5ML IV SOLN: 5.0000 mg | INTRAVENOUS | Status: DC | PRN

## 2023-12-17 MED ORDER — LORAZEPAM 2 MG/ML IJ SOLN
0.0000 mg | Freq: Two times a day (BID) | INTRAMUSCULAR | Status: AC
Start: 2023-12-20 — End: 2023-12-21
  Administered 2023-12-19: 1 mg via INTRAVENOUS
  Administered 2023-12-21: 2 mg via INTRAVENOUS
  Administered 2023-12-21: 1 mg via INTRAVENOUS
  Filled 2023-12-17 (×2): qty 1

## 2023-12-17 MED ORDER — ADULT MULTIVITAMIN W/MINERALS CH
1.0000 | ORAL_TABLET | Freq: Every day | ORAL | Status: DC
  Administered 2023-12-18 – 2023-12-26 (×9): 1 via ORAL
  Filled 2023-12-17 (×10): qty 1

## 2023-12-17 MED ORDER — MORPHINE SULFATE (PF) 2 MG/ML IV SOLN
1.0000 mg | INTRAVENOUS | Status: DC | PRN
  Administered 2023-12-20 – 2023-12-22 (×2): 1 mg via INTRAVENOUS
  Filled 2023-12-17 (×2): qty 1

## 2023-12-17 MED ORDER — DM-GUAIFENESIN ER 30-600 MG PO TB12: 1.0000 | ORAL_TABLET | Freq: Two times a day (BID) | ORAL | Status: DC | PRN

## 2023-12-17 MED ORDER — LORAZEPAM 2 MG/ML IJ SOLN
1.0000 mg | INTRAMUSCULAR | Status: AC | PRN
  Administered 2023-12-18: 1 mg via INTRAVENOUS
  Administered 2023-12-18: 3 mg via INTRAVENOUS
  Administered 2023-12-18: 2 mg via INTRAVENOUS
  Administered 2023-12-18 – 2023-12-20 (×2): 1 mg via INTRAVENOUS
  Filled 2023-12-17 (×2): qty 1
  Filled 2023-12-17: qty 2
  Filled 2023-12-17 (×2): qty 1

## 2023-12-17 MED ORDER — ONDANSETRON HCL 4 MG/2ML IJ SOLN
4.0000 mg | Freq: Three times a day (TID) | INTRAMUSCULAR | Status: DC | PRN
  Administered 2023-12-18 – 2023-12-25 (×2): 4 mg via INTRAVENOUS
  Filled 2023-12-17 (×2): qty 2

## 2023-12-17 MED ORDER — SODIUM CHLORIDE 0.9 % IV BOLUS
1000.0000 mL | Freq: Once | INTRAVENOUS | Status: AC
Start: 2023-12-17 — End: 2023-12-17
  Administered 2023-12-17: 1000 mL via INTRAVENOUS

## 2023-12-17 MED ORDER — FOLIC ACID 1 MG PO TABS
1.0000 mg | ORAL_TABLET | Freq: Every day | ORAL | Status: DC
  Administered 2023-12-18 – 2023-12-26 (×9): 1 mg via ORAL
  Filled 2023-12-17 (×9): qty 1

## 2023-12-17 MED ORDER — LORAZEPAM 2 MG/ML IJ SOLN
0.0000 mg | Freq: Four times a day (QID) | INTRAMUSCULAR | Status: AC
  Administered 2023-12-18 (×2): 2 mg via INTRAVENOUS
  Administered 2023-12-18 – 2023-12-19 (×4): 1 mg via INTRAVENOUS
  Filled 2023-12-17 (×6): qty 1

## 2023-12-17 MED ORDER — OXYCODONE HCL 5 MG PO TABS: 5.0000 mg | ORAL_TABLET | Freq: Four times a day (QID) | ORAL | Status: DC | PRN

## 2023-12-17 MED ORDER — LORAZEPAM 2 MG/ML IJ SOLN
2.0000 mg | Freq: Once | INTRAMUSCULAR | Status: AC
  Administered 2023-12-17: 2 mg via INTRAVENOUS
  Filled 2023-12-17: qty 1

## 2023-12-17 NOTE — ED Notes (Signed)
 RN to meet and assess patient. Vital signs reassessed and patient's blood pressure noted to be 84 systolic. Placed on cardiac monitor and rate 140-160s. Requested tech to perform EKG. This RN notified charge RN and attending physician of patient status.   Patient presented for paracentesis due to fluid build up again after having 5.7 liters removed last week. Reported feeling like he was going to pass out in the waiting room. Has been feeling nauseous and dizzy. Last alcoholic drink was at approximately 3am. Patient has history of seizure withdrawals and feels his last seizure was last time he came to the hospital for this. Patient moved to room 8 for IV and further evaluation. Dr Larinda Buttery to bedside. Handoff provided to [facility].

## 2023-12-17 NOTE — ED Triage Notes (Signed)
 Patient to ED via POV for abd pain/swelling. Sent over to ED for paracentesis. Has cirrhosis of the liver. Last drank last night around 3am. Drinks 1 pint of vodka a day. Had paracentesis last week- came back after a few days.

## 2023-12-17 NOTE — ED Provider Triage Note (Signed)
 Emergency Medicine Provider Triage Evaluation Note  Omar Turner , a 54 y.o. male  was evaluated in triage.  Pt complains of abdominal pain, nauseas vomit, .  Review of Systems  Positive:  Negative:   Physical Exam  BP 106/75   Pulse (!) 118   Temp 98.2 F (36.8 C) (Oral)   Resp 18   Ht 5\' 11"  (1.803 m)   Wt 104.3 kg   SpO2 98%   BMI 32.08 kg/m  Gen:   Awake, no distress   Resp:  Normal effort  MSK:   Moves extremities without difficulty  Other:    Medical Decision Making  Medically screening exam initiated at 3:48 PM.  Appropriate orders placed.  Omar Turner was informed that the remainder of the evaluation will be completed by another provider, this initial triage assessment does not replace that evaluation, and the importance of remaining in the ED until their evaluation is complete.  Patient here for paracentesis.    Gladys Damme, PA-C 12/17/23 1549

## 2023-12-17 NOTE — ED Provider Notes (Signed)
 [facility] Provider Note    Event Date/Time   First MD Initiated Contact with Patient 12/17/23 1948     (approximate)   History   Chief Complaint Abdominal Pain   HPI  Omar Turner is a 53 y.o. male with past medical history of hypertension, alcohol abuse, and cirrhosis who presents to the ED complaining of abdominal pain.  Patient reports increasing abdominal discomfort and distention over the past week, since he last underwent a paracentesis.  He has been feeling generally weak and malaised, also reports intermittent sensation that his heart is racing with some difficulty breathing.  He has noticed some swelling in his legs, denies any fevers, cough, or chest pain.  He states that he has been continuing to urinate normally.  He does state that he continues to drink on a daily basis, about a pint of liquor daily.  His last drink was at 3:00 this morning and he is concerned that he is developing withdrawal symptoms.     Physical Exam   Triage Vital Signs: ED Triage Vitals  Encounter Vitals Group     BP 12/17/23 1544 106/75     Systolic BP Percentile --      Diastolic BP Percentile --      Pulse Rate 12/17/23 1544 (!) 118     Resp 12/17/23 1544 18     Temp 12/17/23 1544 98.2 F (36.8 C)     Temp Source 12/17/23 1544 Oral     SpO2 12/17/23 1544 98 %     Weight 12/17/23 1542 230 lb (104.3 kg)     Height 12/17/23 1542 5\' 11"  (1.803 m)     Head Circumference --      Peak Flow --      Pain Score 12/17/23 1542 8     Pain Loc --      Pain Education --      Exclude from Growth Chart --     Most recent vital signs: Vitals:   12/17/23 2230 12/17/23 2314  BP: 100/75   Pulse: (!) 135 80  Resp: (!) 22 17  Temp:    SpO2: 98% 98%    Constitutional: Alert and oriented. Eyes: Conjunctivae are normal. Head: Atraumatic. Nose: No congestion/rhinnorhea. Mouth/Throat: Mucous membranes are moist.  Cardiovascular: Tachycardic, irregularly irregular  rhythm. Grossly normal heart sounds.  2+ radial pulses bilaterally. Respiratory: Normal respiratory effort.  No retractions. Lungs CTAB. Gastrointestinal: Soft and diffusely tender to palpation with associated distention. Musculoskeletal: No lower extremity tenderness, 1+ pitting edema to knees bilaterally. Neurologic:  Normal speech and language. No gross focal neurologic deficits are appreciated.    ED Results / Procedures / Treatments   Labs (all labs ordered are listed, but only abnormal results are displayed) Labs Reviewed  LIPASE, BLOOD - Abnormal; Notable for the following components:      Result Value   Lipase 82 (*)    All other components within normal limits  COMPREHENSIVE METABOLIC PANEL - Abnormal; Notable for the following components:   Sodium 124 (*)    Potassium 3.0 (*)    Chloride 89 (*)    CO2 20 (*)    Glucose, Bld 111 (*)    Calcium 7.7 (*)    Albumin 2.8 (*)    AST 269 (*)    ALT 83 (*)    Alkaline Phosphatase 200 (*)    Total Bilirubin 6.2 (*)    All other components within normal limits  CBC - Abnormal;  Notable for the following components:   RBC 3.37 (*)    Hemoglobin 12.0 (*)    HCT 32.9 (*)    MCH 35.6 (*)    MCHC 36.5 (*)    Platelets 79 (*)    All other components within normal limits  URINALYSIS, ROUTINE W REFLEX MICROSCOPIC - Abnormal; Notable for the following components:   Color, Urine AMBER (*)    APPearance CLEAR (*)    Ketones, ur 5 (*)    All other components within normal limits  MAGNESIUM - Abnormal; Notable for the following components:   Magnesium 1.6 (*)    All other components within normal limits  T4, FREE - Abnormal; Notable for the following components:   Free T4 1.17 (*)    All other components within normal limits  PROTIME-INR - Abnormal; Notable for the following components:   Prothrombin Time 16.4 (*)    INR 1.3 (*)    All other components within normal limits  TROPONIN I (HIGH SENSITIVITY) - Abnormal; Notable for  the following components:   Troponin I (High Sensitivity) 20 (*)    All other components within normal limits  TSH     EKG  ED ECG REPORT I, Chesley Noon, the attending physician, personally viewed and interpreted this ECG.   Date: 12/17/2023  EKG Time: 20:25  Rate: 160  Rhythm: atrial fibrillation  Axis: Normal  Intervals:none  ST&T Change: None  RADIOLOGY Chest x-ray reviewed and interpreted by me with no infiltrate, edema, or effusion.  PROCEDURES:  Critical Care performed: Yes, see critical care procedure note(s)  .Critical Care  Performed by: Chesley Noon, MD Authorized by: Chesley Noon, MD   Critical care provider statement:    Critical care time (minutes):  30   Critical care time was exclusive of:  Separately billable procedures and treating other patients and teaching time   Critical care was necessary to treat or prevent imminent or life-threatening deterioration of the following conditions:  Metabolic crisis, hepatic failure and cardiac failure   Critical care was time spent personally by me on the following activities:  Development of treatment plan with patient or surrogate, discussions with consultants, evaluation of patient's response to treatment, examination of patient, ordering and review of laboratory studies, ordering and review of radiographic studies, ordering and performing treatments and interventions, pulse oximetry, re-evaluation of patient's condition and review of old charts   I assumed direction of critical care for this patient from another provider in my specialty: no     Care discussed with: admitting provider      MEDICATIONS ORDERED IN ED: Medications  sodium chloride 0.9 % bolus 1,000 mL (0 mLs Intravenous Stopped 12/17/23 2219)  LORazepam (ATIVAN) injection 2 mg (2 mg Intravenous Given 12/17/23 2044)  iohexol (OMNIPAQUE) 350 MG/ML injection 100 mL (100 mLs Intravenous Contrast Given 12/17/23 2057)  metoprolol tartrate (LOPRESSOR)  injection 5 mg (5 mg Intravenous Given 12/17/23 2126)  magnesium sulfate IVPB 2 g 50 mL (0 g Intravenous Stopped 12/17/23 2304)  potassium chloride SA (KLOR-CON M) CR tablet 40 mEq (40 mEq Oral Given 12/17/23 2154)  metoprolol tartrate (LOPRESSOR) injection 5 mg (5 mg Intravenous Given 12/17/23 2219)  metoprolol tartrate (LOPRESSOR) injection 5 mg (5 mg Intravenous Given 12/17/23 2246)     IMPRESSION / MDM / ASSESSMENT AND PLAN / ED COURSE  I reviewed the triage vital signs and the nursing notes.  53 y.o. male with past medical history of hypertension, alcohol abuse, and cirrhosis who presents to the ED complaining of increasing abdominal discomfort and distention for the past week, also with generalized weakness, shortness of breath, and leg swelling.  Patient's presentation is most consistent with acute presentation with potential threat to life or bodily function.  Differential diagnosis includes, but is not limited to, arrhythmia, ACS, PE, pneumonia, pneumothorax, cirrhosis, SBP, bowel obstruction, pancreatitis, hepatitis, electrolyte abnormality, AKI.  Patient ill-appearing but in no acute distress, vital signs remarkable for tachycardia but otherwise reassuring.  EKG shows atrial fibrillation with RVR, patient denies any history of this, no ischemic changes noted on EKG.  Patient had a low BP reading, but recheck is reassuring.  He does not seem to be a cardioversion candidate given he has had intermittent palpitations for a few days, unclear of the exact onset of atrial fibrillation.  We will control heart rate with IV metoprolol, hydrate with IV fluids, and give IV Ativan for developing withdrawal symptoms.  His abdomen is diffusely tender and distended, will further assess with CT imaging.  Chest x-ray also pending at this time.  Labs without significant anemia or leukocytosis, renal function is stable with hypokalemia and hypomagnesemia.  LFTs worsening compared to  previous with bilirubin of 6.2 as well as transaminitis consistent with alcoholic hepatitis.  Heart rate improving following IV metoprolol, blood pressure remained stable.  He does now seem to be alternating between normal sinus rhythm and atrial fibrillation, troponin mildly elevated.  Thyroid function is unremarkable, urinalysis shows no signs of infection.  Chest x-ray also unremarkable, CT of abdomen/pelvis shows ascites but no other acute finding.  Case discussed with hospitalist for admission.      FINAL CLINICAL IMPRESSION(S) / ED DIAGNOSES   Final diagnoses:  Generalized abdominal pain  Ascites due to alcoholic cirrhosis (HCC)  Atrial fibrillation with RVR (HCC)     Rx / DC Orders   ED Discharge Orders     None        Note:  This document was prepared using Dragon voice recognition software and may include unintentional dictation errors.   Chesley Noon, MD 12/17/23 [phone]

## 2023-12-17 NOTE — H&P (Signed)
 History and Physical    Omar Turner ZOX:[id number] DOB: October 08, 1970 DOA: 12/17/2023  Referring MD/NP/PA:   PCP: Lynnea Ferrier, MD   Patient coming from:  The patient is coming from home.     Chief Complaint: Abdominal pain, abdominal distention, heart racing  HPI: Omar Turner is a 53 y.o. male with medical history significant of alcohol abuse, alcoholic liver cirrhosis with ascites, esophageal varices, GI bleeding, HTN, HLD, dCHF, anemia, GERD, duodenitis, skin cancer, who presents with abdominal pain, abdominal distention and heart racing.  Patient states that he had paracentesis with 5.7 L fluid removed last week.  He states that he has abdominal fluid buildup again, with abdominal distention and diffuse abdominal pain.  His abdominal pain is constant, aching, moderate, nonradiating.  He has nausea, no vomiting.  He has 3-4 times of diarrhea each day.  No fever or chills.  Patient states that has heart racing and palpitation, associated with dizziness.  He had some chest discomfort already, which has resolved.  Currently no active chest pain.  He has mild SOB and a mild dry cough.  No symptoms of UTI. He continues to drink on a daily basis, about a pint of liquor daily.  His last drink was at 3:00 this morning.  Patient was found to have new onset A-fib with RVR, heart rate up to 168 initially. Patient was given 3 doses of metoprolol 5 mg IV.  Currently patient has intermittent A-fib, with heart rate in 90s.  Data reviewed independently and ED Course: pt was found to have troponin 20, TSH 2.682, free T4 1.17, WBC 9.3, potassium 3.0, magnesium of 1.6, phosphorus of 2.8, sodium 124, GFR> 60, troponin trending, INR 1.3, temperature normal, blood pressure 83/58 which improved to 100/75 after giving 1 L normal saline bolus, oxygen saturation 98% on RA, RR 22.  Chest x-ray negative. Pt is admitted to PCU as inpatient.  Message sent to Dr. Elease Hashimoto of [facility] for consult.   CT  abdomen/pelvis: 1. Liver cirrhosis with portal hypertension. Moderate volume abdominopelvic ascites. 2. Small right pleural effusion. 3. Aortic atherosclerosis.   EKG: I have personally reviewed.  A-fib, heart rate 160, QTc 475, LAD   Review of Systems:   General: no fevers, chills, no body weight gain, has fatigue HEENT: no blurry vision, hearing changes or sore throat Respiratory: has dyspnea, coughing, no wheezing CV: no chest pain, has palpitations GI: has nausea, abdominal pain, abdominal distention, diarrhea, no constipation, vomiting,  GU: no dysuria, burning on urination, increased urinary frequency, hematuria  Ext: has mild leg edema Neuro: no unilateral weakness, numbness, or tingling, no vision change or hearing loss Skin: no rash, no skin tear. MSK: No muscle spasm, no deformity, no limitation of range of movement in spin Heme: No easy bruising.  Travel history: No recent long distant travel.   Allergy:  Allergies  Allergen Reactions   Chantix [Varenicline]    Lexapro [Escitalopram]          Past Medical History:  Diagnosis Date   Anginal pain (HCC)    Anxiety    Basal cell carcinoma 11/26/2022   Superficial, left spinal upper back, clear with biopsy   Basal cell carcinoma 11/26/2022   L post neck, clear with biopsy   Basal cell carcinoma 05/29/2021   anterior neck - R anterolateral, exc at [facility]   Basal cell carcinoma 06/26/2021   L med scapular back, exc at [facility] cell carcinoma 06/12/2021  L post forearm, Rober Minion   Basal cell carcinoma 06/12/2021   R scapular back, [facility] Minion   Basal cell carcinoma 06/12/2021   Central scapular back   Basal cell carcinoma 12/04/2022   Left Upper Arm - Anterior, [facility]   Basal cell carcinoma 12/04/2022   L upper chest inferior, [facility]   Basal cell carcinoma 12/04/2022   left upper chest lateral, [facility]   Basal cell carcinoma 12/04/2022   left upper chest medial, [facility]   Basal cell carcinoma  12/30/2022   R post neck, [facility]   Basal cell carcinoma 04/01/2023   left mid chest, [facility]   Degenerative disc disease, cervical    Diverticulitis large intestine    dysplastic nevus 11/26/2022   R spinal mid back, shave removal 12/30/22   GERD (gastroesophageal reflux disease)    History of chest pain    Hyperlipidemia    Hypertension    Sleep apnea    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     Past Surgical History:  Procedure Laterality Date   APPENDECTOMY     CARDIAC CATHETERIZATION Left 06/17/2016   Procedure: Left Heart Cath and Coronary Angiography;  Surgeon: Iran Ouch, MD;  Location: [facility] INVASIVE CV LAB;  Service: Cardiovascular;  Laterality: Left;   COLONOSCOPY     COLONOSCOPY WITH PROPOFOL N/A 09/07/2015   Procedure: COLONOSCOPY WITH PROPOFOL;  Surgeon: Wallace Cullens, MD;  Location: [facility] ENDOSCOPY;  Service: Gastroenterology;  Laterality: N/A;   COLONOSCOPY WITH PROPOFOL N/A 05/12/2018   Procedure: COLONOSCOPY WITH PROPOFOL;  Surgeon: Wyline Mood, MD;  Location: [facility] ENDOSCOPY;  Service: Gastroenterology;  Laterality: N/A;   COLONOSCOPY WITH PROPOFOL N/A 02/26/2022   Procedure: COLONOSCOPY WITH PROPOFOL;  Surgeon: Napoleon Form, MD;  Location: [facility] ENDOSCOPY;  Service: Gastroenterology;  Laterality: N/A;   ESOPHAGEAL BANDING  05/09/2023   Procedure: ESOPHAGEAL BANDING;  Surgeon: Norma Fredrickson, Boykin Nearing, MD;  Location: [facility] ENDOSCOPY;  Service: Gastroenterology;;   ESOPHAGEAL BANDING  11/18/2023   Procedure: ESOPHAGEAL BANDING;  Surgeon: Midge Minium, MD;  Location: [facility] ENDOSCOPY;  Service: Endoscopy;;   ESOPHAGOGASTRODUODENOSCOPY (EGD) WITH PROPOFOL N/A 11/16/2021   Procedure: ESOPHAGOGASTRODUODENOSCOPY (EGD) WITH PROPOFOL;  Surgeon: Jaynie Collins, DO;  Location: [facility] ENDOSCOPY;  Service: Gastroenterology;  Laterality: N/A;    ESOPHAGOGASTRODUODENOSCOPY (EGD) WITH PROPOFOL N/A 02/26/2022   Procedure: ESOPHAGOGASTRODUODENOSCOPY (EGD) WITH PROPOFOL;  Surgeon: Napoleon Form, MD;  Location: [facility] ENDOSCOPY;  Service: Gastroenterology;  Laterality: N/A;   ESOPHAGOGASTRODUODENOSCOPY (EGD) WITH PROPOFOL N/A 05/09/2023   Procedure: ESOPHAGOGASTRODUODENOSCOPY (EGD) WITH PROPOFOL;  Surgeon: Toledo, Boykin Nearing, MD;  Location: [facility] ENDOSCOPY;  Service: Gastroenterology;  Laterality: N/A;   ESOPHAGOGASTRODUODENOSCOPY (EGD) WITH PROPOFOL N/A 11/18/2023   Procedure: ESOPHAGOGASTRODUODENOSCOPY (EGD) WITH PROPOFOL;  Surgeon: Midge Minium, MD;  Location: [facility] ENDOSCOPY;  Service: Endoscopy;  Laterality: N/A;   HERNIA REPAIR     IR RADIOLOGIST EVAL & MGMT  02/01/2021   POLYPECTOMY  02/26/2022   Procedure: POLYPECTOMY;  Surgeon: Napoleon Form, MD;  Location: [facility] ENDOSCOPY;  Service: Gastroenterology;;   VENTRAL HERNIA REPAIR N/A 01/17/2021   Procedure: HERNIA REPAIR VENTRAL ADULT;  Surgeon: Leafy Ro, MD;  Location: [facility] ORS;  Service: General;  Laterality: N/A;    Social History:  reports that he has been smoking cigarettes. He has a 22.5 pack-year smoking history. He has never  used smokeless tobacco. He reports current alcohol use of about 14.0 standard drinks of alcohol per week. He reports that he does not use drugs.  Family History:  Family History  Problem Relation Age of Onset   Hypertension Father    Heart disease Father        CABG x 3 & valve replacement.    Heart attack Father      Prior to Admission medications   Medication Sig Start Date End Date Taking? Authorizing Provider  albuterol (VENTOLIN HFA) 108 (90 Base) MCG/ACT inhaler Inhale 1-2 puffs into the lungs every 6 (six) hours as needed.    [provider]  azithromycin (ZITHROMAX) 250 MG tablet Take 2 tablets PO day 1, then 1 tab PO daily x4 days. Patient not taking: Reported on 09/02/2023 06/23/23   Ivery Quale, PA-C  cetirizine (ZYRTEC) 10 MG  tablet Take 10 mg by mouth daily.    [provider]  ferrous sulfate 324 MG TBEC Take 1 tablet (324 mg total) by mouth every other day. Patient not taking: Reported on 11/18/2023 05/11/23   Lurene Shadow, MD  folic acid (FOLVITE) 1 MG tablet Take 1 tablet (1 mg total) by mouth daily. Patient not taking: Reported on 11/18/2023 05/11/23   Lurene Shadow, MD  furosemide (LASIX) 20 MG tablet Take 1 tablet (20 mg total) by mouth daily. 05/11/23   Lurene Shadow, MD  nadolol (CORGARD) 40 MG tablet Take 1 tablet (40 mg total) by mouth daily. 05/11/23   Lurene Shadow, MD  pantoprazole (PROTONIX) 40 MG tablet Take 1 tablet (40 mg total) by mouth daily. 05/11/23   Lurene Shadow, MD  potassium chloride (KLOR-CON) 10 MEQ tablet Take 10 mEq by mouth daily. 08/22/23   [provider]  predniSONE (DELTASONE) 20 MG tablet Take 20 mg by mouth 2 (two) times daily. 07/23/23   [provider]  spironolactone (ALDACTONE) 100 MG tablet Take 1 tablet (100 mg total) by mouth daily. 05/11/23   Lurene Shadow, MD    Physical Exam: Vitals:   12/17/23 2314 12/17/23 2330 12/18/23 0023 12/18/23 0030  BP:  96/66 105/71   Pulse: 80 97 97   Resp: 17 (!) 21    Temp:    98.8 F (37.1 C)  TempSrc:    Oral  SpO2: 98% 98%    Weight:      Height:       General: Not in acute distress HEENT:       Eyes: PERRL, EOMI, no jaundice       ENT: No discharge from the ears and nose, no pharynx injection, no tonsillar enlargement.        Neck: No JVD, no bruit, no mass felt. Heme: No neck lymph node enlargement. Cardiac: S1/S2, irregularly irregular rhythm, No gallops or rubs. Respiratory: No rales, wheezing, rhonchi or rubs. GI:  distended, with mild diffused tenderness, no rebound pain, no organomegaly, BS present. GU: No hematuria Ext: has trace leg edema bilaterally. 1+DP/PT pulse bilaterally. Musculoskeletal: No joint deformities, No joint redness or warmth, no limitation of ROM in spin. Skin: No rashes.   Neuro: Alert, oriented X3, cranial nerves II-XII grossly intact, moves all extremities normally.  Psych: Patient is not psychotic, no suicidal or hemocidal ideation.  Labs on Admission: I have personally reviewed following labs and imaging studies  CBC: Recent Labs  Lab 12/17/23 1544  WBC 9.3  HGB 12.0*  HCT 32.9*  MCV 97.6  PLT 79*   Basic Metabolic Panel: Recent  Labs  Lab 12/17/23 1544 12/17/23 2038 12/17/23 2042  NA 124*  --   --   K 3.0*  --   --   CL 89*  --   --   CO2 20*  --   --   GLUCOSE 111*  --   --   BUN 9  --   --   CREATININE 0.88  --   --   CALCIUM 7.7*  --   --   MG  --  1.6*  --   PHOS  --   --  2.8   GFR: Estimated Creatinine Clearance: 123.4 mL/min (by C-G formula based on SCr of 0.88 mg/dL). Liver Function Tests: Recent Labs  Lab 12/17/23 1544  AST 269*  ALT 83*  ALKPHOS 200*  BILITOT 6.2*  PROT 6.8  ALBUMIN 2.8*   Recent Labs  Lab 12/17/23 1544  LIPASE 82*   No results for input(s): "AMMONIA" in the last 168 hours. Coagulation Profile: Recent Labs  Lab 12/17/23 2038  INR 1.3*   Cardiac Enzymes: No results for input(s): "CKTOTAL", "CKMB", "CKMBINDEX", "TROPONINI" in the last 168 hours. BNP (last 3 results) No results for input(s): "PROBNP" in the last 8760 hours. HbA1C: No results for input(s): "HGBA1C" in the last 72 hours. CBG: No results for input(s): "GLUCAP" in the last 168 hours. Lipid Profile: No results for input(s): "CHOL", "HDL", "LDLCALC", "TRIG", "CHOLHDL", "LDLDIRECT" in the last 72 hours. Thyroid Function Tests: Recent Labs    12/17/23 2038  TSH 2.682  FREET4 1.17*   Anemia Panel: No results for input(s): "VITAMINB12", "FOLATE", "FERRITIN", "TIBC", "IRON", "RETICCTPCT" in the last 72 hours. Urine analysis:    Component Value Date/Time   COLORURINE AMBER (A) 12/17/2023 2125   APPEARANCEUR CLEAR (A) 12/17/2023 2125   APPEARANCEUR Clear 11/02/2013 1724   LABSPEC 1.030 12/17/2023 2125   LABSPEC 1.020  11/02/2013 1724   PHURINE 5.0 12/17/2023 2125   GLUCOSEU NEGATIVE 12/17/2023 2125   GLUCOSEU Negative 11/02/2013 1724   HGBUR NEGATIVE 12/17/2023 2125   BILIRUBINUR NEGATIVE 12/17/2023 2125   BILIRUBINUR Negative 11/02/2013 1724   KETONESUR 5 (A) 12/17/2023 2125   PROTEINUR NEGATIVE 12/17/2023 2125   NITRITE NEGATIVE 12/17/2023 2125   LEUKOCYTESUR NEGATIVE 12/17/2023 2125   LEUKOCYTESUR Negative 11/02/2013 1724   Sepsis Labs: @LABRCNTIP (procalcitonin:4,lacticidven:4) )No results found for this or any previous visit (from the past 240 hours).   Radiological Exams on Admission:   Assessment/Plan Principal Problem:   Atrial fibrillation with RVR (HCC) Active Problems:   Abdominal pain   Alcoholic cirrhosis of liver with ascites (HCC)   Alcohol abuse   Chronic diastolic CHF (congestive heart failure) (HCC)   Duodenitis   Myocardial injury   Hypertension   Tobacco abuse   Thrombocytopenia (HCC)   Diarrhea   Hypokalemia   Electrolyte abnormality   Hyponatremia   Hypomagnesemia   Assessment and Plan:  New onset atrial fibrillation with RVR [facility]): Patient has intermittent A-fib currently, heart rate is in 90s.  Free T4 slightly elevated at 1.17, but TSH is normal 2.682.  Alcohol abuse is at least partially contributed.  CHADS2 score is 2 (HTN and CHF).  Patient is not a good candidate for anticoagulants use due to very high risk of GI bleeding.  -Admitted to PCU as inpatient -Continue home nadolol which is also for liver cirrhosis -As needed metoprolol 5.0 mg every 2 hour for heart rate > 125 -Consult Dr. Marina Gravel of card  Abdominal pain and alcoholic cirrhosis of liver with  ascites (HCC): pt has worsening ascites, will need to rule out SBP.  Will need paracentesis. -will start Rocephin empirically -Will give IV albumin 25 g  -Ordered thoracentesis to be done by IR -Follow-up fluid analysis  Alcohol abuse and tobacco abuse -Did counseling about importance of quitting  smoking and alcohol use -CIWA protocol -Give 1 dose of Librium 25 mg -Nicotine patch  Chronic diastolic CHF (congestive heart failure) (HCC): 2D echo on 11/17/2021 showed EF of 55 to 60% with grade 1 diastolic dysfunction.  Patient has trace leg edema, no oxygen desaturation, does not seem to have CHF exacerbation. -Hold Lasix and spironolactone due to hyponatremia -Check BMP  Duodenitis -Protonix  Myocardial injury: Troponin 20 -Trend troponin -Patient is on nadolol -Will not start aspirin since patient has very high risk of GI bleeding  Hypertension -Nadolol -IV hydralazine as needed  Thrombocytopenia (HCC): This is chronic issue.  Platelet 79.  Most likely due to liver cirrhosis. -Follow-up by CBC  Diarrhea -Check C. Difficile  Electrolytes abnormalities: Hyponatremia with sodium 124, hypokalemia with potassium 3.0, hypomagnesemia with magnesium 1.6.  His phosphorus is normal. -Repleted potassium and magnesium -For hyponatremia: Patient received 1 L normal saline in ED, start sodium chloride tablet 1 g twice daily, fluid restriction -Follow-up with BMP of every 6 hours -Consult pharmacist for electrolytes monitoring    DVT ppx: SCD  Code Status: Full code   Family Communication:   Yes, patient's mother  at bed side.      Disposition Plan:  Anticipate discharge back to previous environment  Consults called: Consult Dr. Marina Gravel of card  Admission status and Level of care: Progressive:  as inpt        Dispo: The patient is from: Home              Anticipated d/c is to: Home              Anticipated d/c date is: 2 days              Patient currently is not medically stable to d/c.    Severity of Illness:  The appropriate patient status for this patient is INPATIENT. Inpatient status is judged to be reasonable and necessary in order to provide the required intensity of service to ensure the patient's safety. The patient's presenting symptoms, physical exam  findings, and initial radiographic and laboratory data in the context of their chronic comorbidities is felt to place them at high risk for further clinical deterioration. Furthermore, it is not anticipated that the patient will be medically stable for discharge from the hospital within 2 midnights of admission.   * I certify that at the point of admission it is my clinical judgment that the patient will require inpatient hospital care spanning beyond 2 midnights from the point of admission due to high intensity of service, high risk for further deterioration and high frequency of surveillance required.*       Date of Service 12/18/2023    Lorretta Harp [facility]   If 7PM-7AM, please contact night-coverage [email or web address] 12/18/2023, 1:09 AM

## 2023-12-17 NOTE — ED Notes (Signed)
 Pts family member to desk stating that pt feels like his heart is racing and that he is going to pass out. VS assessed, will obtain EKG.

## 2023-12-18 ENCOUNTER — Inpatient Hospital Stay

## 2023-12-18 DIAGNOSIS — I4891 Unspecified atrial fibrillation: Secondary | ICD-10-CM | POA: Diagnosis not present

## 2023-12-18 DIAGNOSIS — R197 Diarrhea, unspecified: Secondary | ICD-10-CM

## 2023-12-18 DIAGNOSIS — D696 Thrombocytopenia, unspecified: Secondary | ICD-10-CM | POA: Diagnosis present

## 2023-12-18 DIAGNOSIS — I5A Non-ischemic myocardial injury (non-traumatic): Secondary | ICD-10-CM | POA: Diagnosis present

## 2023-12-18 DIAGNOSIS — K7031 Alcoholic cirrhosis of liver with ascites: Secondary | ICD-10-CM | POA: Diagnosis not present

## 2023-12-18 DIAGNOSIS — E871 Hypo-osmolality and hyponatremia: Secondary | ICD-10-CM | POA: Diagnosis present

## 2023-12-18 DIAGNOSIS — F1010 Alcohol abuse, uncomplicated: Secondary | ICD-10-CM | POA: Diagnosis not present

## 2023-12-18 DIAGNOSIS — K2980 Duodenitis without bleeding: Secondary | ICD-10-CM | POA: Diagnosis not present

## 2023-12-18 DIAGNOSIS — E876 Hypokalemia: Secondary | ICD-10-CM | POA: Diagnosis not present

## 2023-12-18 LAB — BASIC METABOLIC PANEL
Anion gap: 10 (ref 5–15)
Anion gap: 9 (ref 5–15)
Anion gap: 9 (ref 5–15)
BUN: 8 mg/dL (ref 6–20)
BUN: 10 mg/dL (ref 6–20)
BUN: 11 mg/dL (ref 6–20)
CO2: 22 mmol/L (ref 22–32)
CO2: 25 mmol/L (ref 22–32)
CO2: 25 mmol/L (ref 22–32)
Calcium: 7.2 mg/dL — ABNORMAL LOW (ref 8.9–10.3)
Calcium: 7.7 mg/dL — ABNORMAL LOW (ref 8.9–10.3)
Calcium: 8.2 mg/dL — ABNORMAL LOW (ref 8.9–10.3)
Chloride: 94 mmol/L — ABNORMAL LOW (ref 98–111)
Chloride: 94 mmol/L — ABNORMAL LOW (ref 98–111)
Chloride: 95 mmol/L — ABNORMAL LOW (ref 98–111)
Creatinine, Ser: 0.86 mg/dL (ref 0.61–1.24)
Creatinine, Ser: 0.94 mg/dL (ref 0.61–1.24)
Creatinine, Ser: 0.96 mg/dL (ref 0.61–1.24)
GFR, Estimated: >60 mL/min (ref >60)
GFR, Estimated: >60 mL/min (ref >60)
GFR, Estimated: >60 mL/min (ref >60)
Glucose, Bld: 95 mg/dL (ref 70–99)
Glucose, Bld: 87 mg/dL (ref 70–99)
Glucose, Bld: 161 mg/dL — ABNORMAL HIGH (ref 70–99)
Potassium: 3.2 mmol/L — ABNORMAL LOW (ref 3.5–5.1)
Potassium: 3.6 mmol/L (ref 3.5–5.1)
Potassium: 4 mmol/L (ref 3.5–5.1)
Sodium: 126 mmol/L — ABNORMAL LOW (ref 135–145)
Sodium: 128 mmol/L — ABNORMAL LOW (ref 135–145)
Sodium: 129 mmol/L — ABNORMAL LOW (ref 135–145)

## 2023-12-18 LAB — BODY FLUID CELL COUNT WITH DIFFERENTIAL
Eos, Fluid: 0 %
Lymphs, Fluid: 24 %
Monocyte-Macrophage-Serous Fluid: 48 %
Neutrophil Count, Fluid: 28 %
Total Nucleated Cell Count, Fluid: 222 uL

## 2023-12-18 LAB — CBC
HCT: 28.6 % — ABNORMAL LOW (ref 39.0–52.0)
Hemoglobin: 10.2 g/dL — ABNORMAL LOW (ref 13.0–17.0)
MCH: 35.5 pg — ABNORMAL HIGH (ref 26.0–34.0)
MCHC: 35.7 g/dL (ref 30.0–36.0)
MCV: 99.7 fL (ref 80.0–100.0)
Platelets: 52 10*3/uL — ABNORMAL LOW (ref 150–400)
RBC: 2.87 MIL/uL — ABNORMAL LOW (ref 4.22–5.81)
RDW: 15 % (ref 11.5–15.5)
WBC: 7.3 10*3/uL (ref 4.0–10.5)
nRBC: 0 % (ref 0.0–0.2)

## 2023-12-18 LAB — URINE DRUG SCREEN, QUALITATIVE (ARMC ONLY)
Amphetamines, Ur Screen: NOT DETECTED
Barbiturates, Ur Screen: NOT DETECTED
Benzodiazepine, Ur Scrn: NOT DETECTED
Cannabinoid 50 Ng, Ur ~~LOC~~: POSITIVE — AB
Cocaine Metabolite,Ur ~~LOC~~: NOT DETECTED
MDMA (Ecstasy)Ur Screen: NOT DETECTED
Methadone Scn, Ur: NOT DETECTED
Opiate, Ur Screen: NOT DETECTED
Phencyclidine (PCP) Ur S: NOT DETECTED
Tricyclic, Ur Screen: NOT DETECTED

## 2023-12-18 LAB — APTT: aPTT: 34 s (ref 24–36)

## 2023-12-18 LAB — TROPONIN I (HIGH SENSITIVITY)
Troponin I (High Sensitivity): 21 ng/L — ABNORMAL HIGH (ref <18)
Troponin I (High Sensitivity): 21 ng/L — ABNORMAL HIGH (ref <18)

## 2023-12-18 LAB — AMMONIA: Ammonia: 46 umol/L — ABNORMAL HIGH (ref 9–35)

## 2023-12-18 LAB — LACTATE DEHYDROGENASE: LDH: 165 U/L (ref 98–192)

## 2023-12-18 LAB — OSMOLALITY, URINE: Osmolality, Ur: 307 mosm/kg (ref 300–900)

## 2023-12-18 LAB — PHOSPHORUS: Phosphorus: 2.8 mg/dL (ref 2.5–4.6)

## 2023-12-18 LAB — OSMOLALITY: Osmolality: 281 mosm/kg (ref 275–295)

## 2023-12-18 LAB — MAGNESIUM: Magnesium: 2 mg/dL (ref 1.7–2.4)

## 2023-12-18 LAB — SODIUM, URINE, RANDOM: Sodium, Ur: 19 mmol/L

## 2023-12-18 LAB — BRAIN NATRIURETIC PEPTIDE: B Natriuretic Peptide: 63.7 pg/mL (ref 0.0–100.0)

## 2023-12-18 MED ORDER — CHLORDIAZEPOXIDE HCL 25 MG PO CAPS
25.0000 mg | ORAL_CAPSULE | Freq: Two times a day (BID) | ORAL | Status: AC
  Administered 2023-12-19 – 2023-12-20 (×2): 25 mg via ORAL
  Filled 2023-12-18 (×2): qty 1

## 2023-12-18 MED ORDER — LIDOCAINE HCL (PF) 1 % IJ SOLN
20.0000 mL | Freq: Once | INTRAMUSCULAR | Status: AC
  Administered 2023-12-18: 20 mL via INTRADERMAL

## 2023-12-18 MED ORDER — NADOLOL 20 MG PO TABS
20.0000 mg | ORAL_TABLET | Freq: Two times a day (BID) | ORAL | Status: DC
  Administered 2023-12-19: 10 mg via ORAL
  Filled 2023-12-18: qty 1

## 2023-12-18 MED ORDER — CHLORDIAZEPOXIDE HCL 25 MG PO CAPS: 25.0000 mg | ORAL_CAPSULE | Freq: Three times a day (TID) | ORAL | Status: DC

## 2023-12-18 MED ORDER — NICOTINE 21 MG/24HR TD PT24
21.0000 mg | MEDICATED_PATCH | Freq: Every day | TRANSDERMAL | Status: DC
  Administered 2023-12-18 – 2023-12-26 (×9): 21 mg via TRANSDERMAL
  Filled 2023-12-18 (×9): qty 1

## 2023-12-18 MED ORDER — CHLORDIAZEPOXIDE HCL 25 MG PO CAPS: 25.0000 mg | ORAL_CAPSULE | Freq: Two times a day (BID) | ORAL | Status: DC

## 2023-12-18 MED ORDER — PREDNISONE 20 MG PO TABS
20.0000 mg | ORAL_TABLET | Freq: Two times a day (BID) | ORAL | Status: DC
  Administered 2023-12-18 – 2023-12-24 (×13): 20 mg via ORAL
  Filled 2023-12-18 (×13): qty 1

## 2023-12-18 MED ORDER — FERROUS SULFATE 325 (65 FE) MG PO TABS
325.0000 mg | ORAL_TABLET | ORAL | Status: DC
Start: 2023-12-19 — End: 2023-12-26
  Administered 2023-12-19 – 2023-12-25 (×4): 325 mg via ORAL
  Filled 2023-12-18 (×5): qty 1

## 2023-12-18 MED ORDER — ALBUMIN HUMAN 25 % IV SOLN
25.0000 g | Freq: Once | INTRAVENOUS | Status: AC
  Administered 2023-12-18: 25 g via INTRAVENOUS
  Filled 2023-12-18: qty 100

## 2023-12-18 MED ORDER — POTASSIUM CHLORIDE CRYS ER 20 MEQ PO TBCR
40.0000 meq | EXTENDED_RELEASE_TABLET | Freq: Two times a day (BID) | ORAL | Status: DC
  Administered 2023-12-18 (×2): 40 meq via ORAL
  Filled 2023-12-18 (×2): qty 2

## 2023-12-18 MED ORDER — CHLORDIAZEPOXIDE HCL 25 MG PO CAPS
25.0000 mg | ORAL_CAPSULE | Freq: Three times a day (TID) | ORAL | Status: AC
  Administered 2023-12-18 – 2023-12-19 (×3): 25 mg via ORAL
  Filled 2023-12-18 (×3): qty 1

## 2023-12-18 MED ORDER — CHLORDIAZEPOXIDE HCL 5 MG PO CAPS: 10.0000 mg | ORAL_CAPSULE | Freq: Two times a day (BID) | ORAL | Status: DC

## 2023-12-18 MED ORDER — CHLORDIAZEPOXIDE HCL 10 MG PO CAPS
10.0000 mg | ORAL_CAPSULE | Freq: Two times a day (BID) | ORAL | Status: DC
  Administered 2023-12-20 – 2023-12-26 (×12): 10 mg via ORAL
  Filled 2023-12-18 (×12): qty 1

## 2023-12-18 MED ORDER — SODIUM CHLORIDE 0.9 % IV SOLN
2.0000 g | INTRAVENOUS | Status: AC
  Administered 2023-12-18 – 2023-12-24 (×7): 2 g via INTRAVENOUS
  Filled 2023-12-18 (×8): qty 20

## 2023-12-18 MED ORDER — NADOLOL 40 MG PO TABS
40.0000 mg | ORAL_TABLET | Freq: Every day | ORAL | Status: DC
  Administered 2023-12-18: 40 mg via ORAL
  Filled 2023-12-18: qty 1

## 2023-12-18 MED ORDER — PANTOPRAZOLE SODIUM 40 MG PO TBEC
40.0000 mg | DELAYED_RELEASE_TABLET | Freq: Every day | ORAL | Status: DC
Start: 2023-12-18 — End: 2023-12-26
  Administered 2023-12-18 – 2023-12-26 (×9): 40 mg via ORAL
  Filled 2023-12-18 (×9): qty 1

## 2023-12-18 NOTE — Procedures (Signed)
 PROCEDURE SUMMARY:  Successful US guided paracentesis from right lateral abdomen.  Yielded 4.1 liters of clear yellow fluid.  No immediate complications.  Patient tolerated well.  EBL = trace  Loman Brooklyn PA-C 12/18/2023 2:31 PM

## 2023-12-18 NOTE — Progress Notes (Signed)
 Progress Note   Patient: Omar Turner AOZ:[id number] DOB: 1971/08/27 DOA: 12/17/2023     1 DOS: the patient was seen and examined on 12/18/2023   Brief hospital course: DEQUINCY BORN is a 53 y.o. male with medical history significant of alcohol abuse, alcoholic liver cirrhosis with ascites, esophageal varices, GI bleeding, HTN, HLD, dCHF, anemia, GERD, duodenitis, skin cancer, who presents with abdominal pain, abdominal distention and heart racing.   Patient is admitted for new onset Afib with RVR, Alcohol abuse, ascitis, hyponatremia, hypokalemia.   Assessment and Plan: A-fib with RVR New diagnosis. Continue telemetry monitoring. Patient will be continued on home nadolol therapy. Cardiology consultation pending. No anticoagulation given high risk for GI bleed given liver cirrhosis.  Abdominal discomfort Alcoholic liver cirrhosis Patient is abdominal distention worsening. He is started on Rocephin. Ultrasound-guided paracentesis orders placed. Follow-up fluid analysis  Alcohol abuse Continue IV Ativan per CIWA protocol. Librium tapering dose ordered. Substance abuse consult placed.  Hyponatremia: Hypervolemic, beer potomania Chronic low sodium noted. Continue to trend sodium every 6 hours.  Hypokalemia: Oral potassium supplementation ordered. Monitor daily electrolytes.  Tobacco abuse:  Smoking cessation counseled. Ill effects of tobacco explained. Nicotine patch ordered.  Chronic diastolic CHF: Grade 1 diastolic dysfunction on 2D echo from 2 years ago. Hold Lasix, Aldactone due to hyponatremia. Continue to monitor for fluid overload.  GERD Duodenitis Continue Protonix therapy.  Thrombocytopenia- Chronic due to alcohol use. Platelets stable.  No active bleeding.        Out of bed to chair. Incentive spirometry. Nursing supportive care. Fall, aspiration precautions. DVT prophylaxis   Code Status: Full Code  Subjective: Patient is seen and  examined today morning.  Physical Exam: Vitals:   12/18/23 0600 12/18/23 0642 12/18/23 0700 12/18/23 0733  BP: 108/65 102/65 119/75   Pulse: 98 (!) 106 (!) 101   Resp: 18     Temp:    98.3 F (36.8 C)  TempSrc:    Oral  SpO2: 96%     Weight:      Height:        General - Middle aged Caucasian obese male, mild respiratory distress HEENT - PERRLA, EOMI, atraumatic head, non tender sinuses. Lung - Clear, diffuse rales, rhonchi, no wheezes. Heart - S1, S2 heard, no murmurs, rubs, trace pedal edema. Abdomen - Soft, diffuse tender, distended, bowel sounds good Neuro - Alert, awake and oriented x 3, non focal exam. Skin - Warm and dry.  Data Reviewed:      Latest Ref Rng & Units 12/17/2023    3:44 PM 12/01/2023   10:08 AM 11/19/2023    8:29 AM  CBC  WBC 4.0 - 10.5 K/uL 9.3  11.2  8.1   Hemoglobin 13.0 - 17.0 g/dL 96.2  95.2  84.1   Hematocrit 39.0 - 52.0 % 32.9  33.2  29.4   Platelets 150 - 400 K/uL 79  228  73       Latest Ref Rng & Units 12/18/2023   12:26 AM 12/17/2023    3:44 PM 12/01/2023    9:12 AM  BMP  Glucose 70 - 99 mg/dL 95  324    BUN 6 - 20 mg/dL 8  9    Creatinine 4.01 - 1.24 mg/dL 0.27  2.53    Sodium 664 - 145 mmol/L 126  124  130   Potassium 3.5 - 5.1 mmol/L 3.2  3.0    Chloride 98 - 111 mmol/L 94  89    CO2  22 - 32 mmol/L 22  20    Calcium 8.9 - 10.3 mg/dL 7.2  7.7     DG Chest Portable 1 View Result Date: 12/17/2023 CLINICAL DATA:  Shortness of breath and tachycardia EXAM: PORTABLE CHEST 1 VIEW COMPARISON:  09/02/2023 FINDINGS: The heart size and mediastinal contours are within normal limits. Both lungs are clear. The visualized skeletal structures are unremarkable. IMPRESSION: No active disease. Electronically Signed   By: Jasmine Pang M.D.   On: 12/17/2023 22:49   CT ABDOMEN PELVIS W CONTRAST Result Date: 12/17/2023 CLINICAL DATA:  Abdomen pain swelling history of cirrhosis EXAM: CT ABDOMEN AND PELVIS WITH CONTRAST TECHNIQUE: Multidetector CT imaging  of the abdomen and pelvis was performed using the standard protocol following bolus administration of intravenous contrast. RADIATION DOSE REDUCTION: This exam was performed according to the departmental dose-optimization program which includes automated exposure control, adjustment of the mA and/or kV according to patient size and/or use of iterative reconstruction technique. CONTRAST:  OMNIPAQUE IOHEXOL 350 MG/ML SOLN COMPARISON:  MRI 12/02/2023, CT 11/18/2023, 07/10/2023 FINDINGS: Lower chest: Lung bases demonstrate small right-sided pleural effusion Hepatobiliary: Coarse heterogeneous nodular liver parenchyma consistent with history of cirrhosis. Hypodense exophytic area off the inferior right hepatic lobe without suspicious mass on interval MRI. Mild hyperdense sludge or small stones in the gallbladder. No biliary dilatation Pancreas: Unremarkable. No pancreatic ductal dilatation or surrounding inflammatory changes. Spleen: Mildly enlarged with craniocaudal measurement of 14 cm. Adrenals/Urinary Tract: Adrenal glands are unremarkable. Kidneys are normal, without renal calculi, focal lesion, or hydronephrosis. Bladder is unremarkable. Stomach/Bowel: Stomach is within normal limits. Appendectomy. No evidence of bowel wall thickening, distention, or inflammatory changes. Vascular/Lymphatic: Aortic atherosclerosis. No enlarged abdominal or pelvic lymph nodes. Recanalized paraumbilical vein. Small perigastric varices. Reproductive: Prostate is unremarkable. Other: No free air.  Moderate volume abdominopelvic ascites Musculoskeletal: No acute or suspicious osseous abnormality. IMPRESSION: 1. Liver cirrhosis with portal hypertension. Moderate volume abdominopelvic ascites. 2. Small right pleural effusion. 3. Aortic atherosclerosis. Electronically Signed   By: Jasmine Pang M.D.   On: 12/17/2023 22:48    Family Communication: Discussed with patient, he understand and agree. All questions  answered.  Disposition: Status is: Inpatient Remains inpatient appropriate because: hyponatremia, new Afib  Planned Discharge Destination: Home     Time spent: 39 minutes  Author: Marcelino Duster, MD 12/18/2023 8:07 AM Secure chat 7am to 7pm For on call review [email or web address].

## 2023-12-18 NOTE — Discharge Instructions (Signed)

## 2023-12-18 NOTE — ED Notes (Signed)
 Pt tx for Paracentesis.

## 2023-12-18 NOTE — ED Notes (Addendum)
 Pt reports now seeing and hearing hallucinations, stated he was talking to people who are not there and would snap out of it and realize that no one was there. Family at bedside stated he has certain moments of confusion, CIWA scale performed and PRN meds will be given. Pt looks calm at this time, VSS

## 2023-12-18 NOTE — ED Notes (Signed)
Pt returned from paracentesis.

## 2023-12-18 NOTE — TOC Initial Note (Signed)
 Transition of Care [facility]) - Initial/Assessment Note    Patient Details  Name: Omar Turner MRN: [id number] Date of Birth: 1971-09-19  Transition of Care [facility]) CM/SW Contact:    Colin Broach, LCSW Phone Number: 12/18/2023, 2:33 PM  Clinical Narrative:     CSW met with patient and introduced self and reason for visit.  Demographic and insurance information verified.  Patient amenable to answering readmission screening questions.  He reports that his PCP is Daniel Nones, MD and that he uses [facility] pharmacy.  He reports that he has no concerns about paying for his meds. Patient reports that he lives alone and doesn't use any DME.  At d/c, a family  member will transport home.  Pt amenable to having SUD resources placed in AVS.  CM to follow for any d/c needs that may arise.                      Patient Goals and CMS Choice            Expected Discharge Plan and Services                                              Prior Living Arrangements/Services                       Activities of Daily Living      Permission Sought/Granted                  Emotional Assessment              Admission diagnosis:  Atrial fibrillation with RVR (HCC) [I48.91] Patient Active Problem List   Diagnosis Date Noted   Hyponatremia 12/18/2023   Hypomagnesemia 12/18/2023   Myocardial injury 12/18/2023   Thrombocytopenia (HCC) 12/18/2023   Diarrhea 12/18/2023   Atrial fibrillation with RVR (HCC) 12/17/2023   Abdominal pain 12/17/2023   Chronic diastolic CHF (congestive heart failure) (HCC) 12/17/2023   ABLA (acute blood loss anemia) 11/20/2023   Esophageal varices with bleeding in diseases classified elsewhere (HCC) 11/19/2023   Essential hypertension 11/18/2023   Duodenitis 11/18/2023   Acute upper GI bleeding 11/18/2023   Secondary esophageal varices without bleeding (HCC) 11/18/2023   GI bleeding 11/17/2023   Acute maxillary sinusitis 09/02/2023    Eustachian tube dysfunction, right 06/23/2023   GI bleed 05/09/2023   Bright red rectal bleeding 05/08/2023   Alcohol abuse 05/08/2023   Acute upper respiratory infection 11/04/2022   Acute bronchitis 11/04/2022   Adenomatous polyp of ascending colon    Abnormal CT scan, colon    Alcoholic hepatitis with ascites 02/22/2022   Gastritis with bleeding 11/17/2021   Shortness of breath 11/16/2021   Gastritis without bleeding 11/16/2021   Acute upper GI bleed 11/16/2021   Hematemesis 11/15/2021   Alcoholic cirrhosis of liver with ascites (HCC) 11/15/2021   Electrolyte abnormality 11/15/2021   Incarcerated ventral hernia 01/17/2021   Other specified anemias 01/03/2021   Alcohol dependence (HCC) 01/02/2021   Portal hypertensive gastropathy (HCC) 01/02/2021   Ascites 12/11/2020   Bilateral lower extremity edema 12/11/2020   Melena 12/11/2020   COVID-19 virus infection 09/23/2020   Elevated LFTs 09/23/2020   Hypokalemia 09/23/2020   Nausea & vomiting 09/23/2020   Alcohol abuse with withdrawal (HCC) 08/09/2019   DDD (degenerative disc disease), cervical 03/20/2018  Hypertension 03/20/2018   Sleep apnea 03/20/2018   Diverticulitis of large intestine with perforation without abscess or bleeding 03/15/2018   Gastroesophageal reflux disease without esophagitis 04/03/2017   History of chest pain 04/03/2017   Other hyperlipidemia 04/03/2017   Precordial pain    Tobacco abuse 08/21/2015   Anxiety state 02/15/2015   PCP:  Lynnea Ferrier, MD Pharmacy:   [id number] [place], [place] - [address] AT [facility] [address] [facility] [address] [place] [address] Phone: [phone] Fax: [phone]  [facility] [address] - [place], [place] - [address] [facility] AT [facility] [address] [place] [address] Phone: [phone] Fax: [phone]     Social Drivers of Health (SDOH) Social History: SDOH Screenings   Food  Insecurity: Patient Unable To Answer (11/18/2023)  Housing: Unknown (12/03/2023)   Received from [facility]  Transportation Needs: Patient Unable To Answer (11/18/2023)  Utilities: Patient Unable To Answer (11/18/2023)  Financial Resource Strain: Low Risk  (12/11/2020)   Received from [facility]  Social Connections: Patient Unable To Answer (11/18/2023)  Tobacco Use: High Risk (12/17/2023)   SDOH Interventions:     Readmission Risk Interventions    12/18/2023    2:33 PM  Readmission Risk Prevention Plan  Transportation Screening Complete  PCP or Specialist Appt within 3-5 Days Complete  HRI or Home Care Consult Complete  Social Work Consult for Recovery Care Planning/Counseling Complete  Palliative Care Screening Not Applicable  Medication Review Oceanographer) Complete

## 2023-12-18 NOTE — ED Notes (Signed)
 This RN continuing care of pt at this time. Pt reporting to ED d/t abdominal pain. Pt received paracentesis and is awaiting inpatient bed. Pt has hx of alcohol use with last use just over 24 hours ago. Pt was in A-Fib with RVR but was given Metoprolol and went back in NSR. Pt on CIWA for possible withdrawal, receiving Ativan. Pts mother at the bedside. Pt connected to monitor. Bed in lowest locked position. Call bell in reach. Denies needs at this time.   Past Medical History:  Diagnosis Date   Anginal pain (HCC)    Anxiety    Basal cell carcinoma 11/26/2022   Superficial, left spinal upper back, clear with biopsy   Basal cell carcinoma 11/26/2022   L post neck, clear with biopsy   Basal cell carcinoma 05/29/2021   anterior neck - R anterolateral, exc at [facility]   Basal cell carcinoma 06/26/2021   L med scapular back, exc at [facility] cell carcinoma 06/12/2021   L post forearm, Rober Minion   Basal cell carcinoma 06/12/2021   R scapular back, Rober Minion   Basal cell carcinoma 06/12/2021   Central scapular back   Basal cell carcinoma 12/04/2022   Left Upper Arm - Anterior, [facility]   Basal cell carcinoma 12/04/2022   L upper chest inferior, [facility]   Basal cell carcinoma 12/04/2022   left upper chest lateral, [facility]   Basal cell carcinoma 12/04/2022   left upper chest medial, [facility]   Basal cell carcinoma 12/30/2022   R post neck, [facility]   Basal cell carcinoma 04/01/2023   left mid chest, [facility]   Degenerative disc disease, cervical    Diverticulitis large intestine    dysplastic nevus 11/26/2022   R spinal mid back, shave removal 12/30/22   GERD (gastroesophageal reflux disease)    History of chest pain    Hyperlipidemia    Hypertension    Sleep apnea    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

## 2023-12-18 NOTE — Consult Note (Signed)
 Cardiology Consultation   Patient ID: Omar Turner MRN: [id number]; DOB: 1971-02-22  Admit date: 12/17/2023 Date of Consult: 12/18/2023  PCP:  Lynnea Ferrier, MD   [facility] Providers Cardiologist:  None        Patient Profile:   Omar Turner is a 53 y.o. male with a hx of basal cell carcinoma, esophageal varices, GERD, GI bleeding, hyperlipidemia, hypertension, nononstructive CAD, OSA, squamous cell carcinoma, tobacco use, liver cirrhosis with ascites, and alcohol abuse who is being seen 12/18/2023 for the evaluation of atrial fibrillation at the request of Dr. Clide Dales.  History of Present Illness:   Omar Turner was seen by [facility] for chest pain and shortness of breath 06/2016. Cardiac cath 06/2016 showed non obstructive CAD with 10% mid LAD stenosis, 10 % ost LAD stenosis, normal LVSF, normal LEDP, and LVEF 55-65%. He was lost to follow up in our office. He has been admitted to [facility] several times in the past 5 years with complications related to alcohol use including alcohol withdrawal, GI bleeding, and liver cirrhosis with ascites. He was hospitalized most recently 11/17/2023-11/19/2023 for acute onset coffee-ground emesis, melena, and BRBPR. Upper endoscopy showed grade II esophageal varices and 4 bands were placed. Despite ongoing melena, patient signed out against medical advice. He was started on nadolol with plans to start spironolactone if BP tolerated. After discharge, patient did follow up with gastroenterology and underwent paracentesis 12/01/2023 yielding 5.6 L and again on 12/08/2023 yielding 5.7 L.   Patient reports feeling unwell for the past several days. He felt his abdominal distension returning with abdominal pain, nausea, and diarrhea and decided to report to the ED. He reports that yesterday evening while in the ED he started feeling palpitations with dizziness. He denies chest pain, shortness of breath, diaphoresis, fever, chills, and vomiting. In  the ED, BP 106/75, HR 118, RR 18, T 98.2 F, SpO2 98%. CMP with Na 124, K 3, Cl 89, Ca 7.7, significantly elevated LFTs, and albumin 2.8. CBC showed hgb 12, hct 32.9, mch 35.6, and mchc 36.5. His initial EKG showed sinus rhythm with repeat EKG showing atrial fibrillation with RVR. No known history of afib. TSH was wnl and free T4 slightly above normal at 1.17. Troponin minimally elevated at 20. CT A/P showed liver cirrhosis with portal hypertension and moderate volume abdominopelvic ascites, small right pleural effusion, and aortic atherosclerosis. He was given IV metoprolol for rate control, given IV fluids, and IV Ativan for alcohol withdrawal.   Past Medical History:  Diagnosis Date   Anginal pain (HCC)    Anxiety    Basal cell carcinoma 11/26/2022   Superficial, left spinal upper back, clear with biopsy   Basal cell carcinoma 11/26/2022   L post neck, clear with biopsy   Basal cell carcinoma 05/29/2021   anterior neck - R anterolateral, exc at [facility]   Basal cell carcinoma 06/26/2021   L med scapular back, exc at [facility] cell carcinoma 06/12/2021   L post forearm, Rober Minion   Basal cell carcinoma 06/12/2021   R scapular back, Rober Minion   Basal cell carcinoma 06/12/2021   Central scapular back   Basal cell carcinoma 12/04/2022   Left Upper Arm - Anterior, [facility]   Basal cell carcinoma 12/04/2022   L upper chest inferior, [facility]   Basal cell carcinoma 12/04/2022   left upper chest lateral, [facility]   Basal cell carcinoma 12/04/2022   left upper chest medial, [facility]  Basal cell carcinoma 12/30/2022   R post neck, [facility]   Basal cell carcinoma 04/01/2023   left mid chest, [facility]   Degenerative disc disease, cervical    Diverticulitis large intestine    dysplastic nevus 11/26/2022   R spinal mid back, shave removal 12/30/22   GERD (gastroesophageal reflux disease)    History of chest pain    Hyperlipidemia    Hypertension    Sleep apnea    Squamous cell carcinoma in situ  02/23/2021   L post forearm, Rober Minion   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/26/2022   L upper mid back, in situ, clear with biopsy   Tobacco abuse     Past Surgical History:  Procedure Laterality Date   APPENDECTOMY     CARDIAC CATHETERIZATION Left 06/17/2016   Procedure: Left Heart Cath and Coronary Angiography;  Surgeon: Iran Ouch, MD;  Location: [facility] INVASIVE CV LAB;  Service: Cardiovascular;  Laterality: Left;   COLONOSCOPY     COLONOSCOPY WITH PROPOFOL N/A 09/07/2015   Procedure: COLONOSCOPY WITH PROPOFOL;  Surgeon: Wallace Cullens, MD;  Location: [facility] ENDOSCOPY;  Service: Gastroenterology;  Laterality: N/A;   COLONOSCOPY WITH PROPOFOL N/A 05/12/2018   Procedure: COLONOSCOPY WITH PROPOFOL;  Surgeon: Wyline Mood, MD;  Location: [facility] ENDOSCOPY;  Service: Gastroenterology;  Laterality: N/A;   COLONOSCOPY WITH PROPOFOL N/A 02/26/2022   Procedure: COLONOSCOPY WITH PROPOFOL;  Surgeon: Napoleon Form, MD;  Location: [facility] ENDOSCOPY;  Service: Gastroenterology;  Laterality: N/A;   ESOPHAGEAL BANDING  05/09/2023   Procedure: ESOPHAGEAL BANDING;  Surgeon: Norma Fredrickson, Boykin Nearing, MD;  Location: [facility] ENDOSCOPY;  Service: Gastroenterology;;   ESOPHAGEAL BANDING  11/18/2023   Procedure: ESOPHAGEAL BANDING;  Surgeon: Midge Minium, MD;  Location: [facility] ENDOSCOPY;  Service: Endoscopy;;   ESOPHAGOGASTRODUODENOSCOPY (EGD) WITH PROPOFOL N/A 11/16/2021   Procedure: ESOPHAGOGASTRODUODENOSCOPY (EGD) WITH PROPOFOL;  Surgeon: Jaynie Collins, DO;  Location: [facility] ENDOSCOPY;  Service: Gastroenterology;  Laterality: N/A;   ESOPHAGOGASTRODUODENOSCOPY (EGD) WITH PROPOFOL N/A 02/26/2022   Procedure: ESOPHAGOGASTRODUODENOSCOPY (EGD) WITH PROPOFOL;  Surgeon: Napoleon Form, MD;  Location: [facility] ENDOSCOPY;  Service: Gastroenterology;  Laterality: N/A;   ESOPHAGOGASTRODUODENOSCOPY (EGD) WITH PROPOFOL N/A 05/09/2023   Procedure: ESOPHAGOGASTRODUODENOSCOPY (EGD) WITH PROPOFOL;  Surgeon: Toledo, Boykin Nearing, MD;  Location: [facility]  ENDOSCOPY;  Service: Gastroenterology;  Laterality: N/A;   ESOPHAGOGASTRODUODENOSCOPY (EGD) WITH PROPOFOL N/A 11/18/2023   Procedure: ESOPHAGOGASTRODUODENOSCOPY (EGD) WITH PROPOFOL;  Surgeon: Midge Minium, MD;  Location: [facility] ENDOSCOPY;  Service: Endoscopy;  Laterality: N/A;   HERNIA REPAIR     IR RADIOLOGIST EVAL & MGMT  02/01/2021   POLYPECTOMY  02/26/2022   Procedure: POLYPECTOMY;  Surgeon: Napoleon Form, MD;  Location: [facility] ENDOSCOPY;  Service: Gastroenterology;;   VENTRAL HERNIA REPAIR N/A 01/17/2021   Procedure: HERNIA REPAIR VENTRAL ADULT;  Surgeon: Leafy Ro, MD;  Location: [facility] ORS;  Service: General;  Laterality: N/A;       Inpatient Medications: Scheduled Meds:  [START ON 12/19/2023] ferrous sulfate  325 mg Oral QODAY   folic acid  1 mg Oral Daily   LORazepam  0-4 mg Intravenous Q6H   Followed by   Melene Muller ON 12/20/2023] LORazepam  0-4 mg Intravenous Q12H   multivitamin with minerals  1 tablet Oral Daily   nadolol  40 mg Oral Daily   nicotine  21 mg Transdermal Daily   pantoprazole  40 mg Oral Daily   potassium chloride  40 mEq Oral BID   predniSONE  20 mg Oral BID   sodium  chloride  1 g Oral BID WC   thiamine  100 mg Oral Daily   Or   thiamine  100 mg Intravenous Daily   Continuous Infusions:  cefTRIAXone (ROCEPHIN)  IV Stopped (12/18/23 0222)   PRN Meds: albuterol, dextromethorphan-guaiFENesin, hydrALAZINE, LORazepam **OR** LORazepam, metoprolol tartrate, morphine injection, ondansetron (ZOFRAN) IV, oxyCODONE  Allergies:    Allergies  Allergen Reactions   Chantix [Varenicline]    Lexapro [Escitalopram]          Social History:   Social History   Socioeconomic History   Marital status: Single    Spouse name: Not on file   Number of children: Not on file   Years of education: Not on file   Highest education level: Not on file  Occupational History   Not on file  Tobacco Use   Smoking status: Every Day    Current packs/day: 1.50    Average  packs/day: 1.5 packs/day for 15.0 years (22.5 ttl pk-yrs)    Types: Cigarettes   Smokeless tobacco: Never  Vaping Use   Vaping status: Never Used  Substance and Sexual Activity   Alcohol use: Yes    Alcohol/week: 14.0 standard drinks of alcohol    Types: 14 Shots of liquor per week    Comment: per day   Drug use: No   Sexual activity: Not on file  Other Topics Concern   Not on file  Social History Narrative   Not on file   Social Drivers of Health   Financial Resource Strain: Low Risk  (12/11/2020)   [facility] from [facility]   Overall Financial Resource Strain (CARDIA)    Difficulty of Paying Living Expenses: Not very hard  Food Insecurity: Patient Unable To Answer (11/18/2023)   Hunger Vital Sign    Worried About Running Out of Food in the Last Year: Patient unable to answer    Ran Out of Food in the Last Year: Patient unable to answer  Transportation Needs: Patient Unable To Answer (11/18/2023)   PRAPARE - Transportation    Lack of Transportation (Medical): Patient unable to answer    Lack of Transportation (Non-Medical): Patient unable to answer  Physical Activity: Not on file  Stress: Not on file  Social Connections: Patient Unable To Answer (11/18/2023)   Social Connection and Isolation Panel [NHANES]    Frequency of Communication with Friends and Family: Patient unable to answer    Frequency of Social Gatherings with Friends and Family: Patient unable to answer    Attends Religious Services: Patient unable to answer    Active Member of Clubs or Organizations: Patient unable to answer    Attends Banker Meetings: Patient unable to answer    Marital Status: Patient unable to answer  Intimate Partner Violence: Patient Unable To Answer (11/18/2023)   Humiliation, Afraid, Rape, and Kick questionnaire    Fear of Current or Ex-Partner: Patient unable to answer    Emotionally Abused: Patient unable to answer    Physically Abused: Patient unable  to answer    Sexually Abused: Patient unable to answer    Family History:    Family History  Problem Relation Age of Onset   Hypertension Father    Heart disease Father        CABG x 3 & valve replacement.    Heart attack Father      ROS:  Please see the history of present illness.    Physical Exam/Data:   Vitals:  12/18/23 0815 12/18/23 0830 12/18/23 0900 12/18/23 1030  BP: 102/72 111/69 113/74 (!) 97/56  Pulse: (!) 103 (!) 111 (!) 123 (!) 101  Resp: (!) 24 17 (!) 21 (!) 21  Temp:      TempSrc:      SpO2: 98% 100% 98% 95%  Weight:      Height:        Intake/Output Summary (Last 24 hours) at 12/18/2023 1214 Last data filed at 12/17/2023 2229 Gross per 24 hour  Intake --  Output 200 ml  Net -200 ml      12/17/2023    3:42 PM 11/17/2023    7:04 PM 09/02/2023    2:56 PM  Last 3 Weights  Weight (lbs) 230 lb 230 lb 228 lb  Weight (kg) 104.327 kg 104.327 kg 103.42 kg     Body mass index is 32.08 kg/m.  General:  Well nourished, well developed, in no acute distress HEENT: normal Neck: no JVD Cardiac:  normal S1, S2; RRR; no murmur  Lungs:  clear to auscultation bilaterally, no wheezing, rhonchi or rales  Abd: abdominal distension present with tenderness to palpation Ext: no edema Skin: warm and dry  Psych:  Normal affect   EKG:  The EKG was personally reviewed and demonstrates: Initial EKG shows sinus tachycardia, rate 105 bpm. Repeat EKG shows atrial fibrillation rate 160 bpm Telemetry:  Telemetry was personally reviewed and demonstrates:  atrial fibrillation with RVR with subsequent conversion to sinus rhythm ~2315 on 3/12  Relevant CV Studies:  Echo ordered  Laboratory Data:  High Sensitivity Troponin:   Recent Labs  Lab 12/17/23 2038 12/18/23 0026 12/18/23 0719  TROPONINIHS 20* 21* 21*     Chemistry Recent Labs  Lab 12/17/23 1544 12/17/23 2038 12/18/23 0026 12/18/23 0719  NA 124*  --  126* 128*  K 3.0*  --  3.2* 3.6  CL 89*  --  94* 94*   CO2 20*  --  22 25  GLUCOSE 111*  --  95 87  BUN 9  --  8 10  CREATININE 0.88  --  0.86 0.94  CALCIUM 7.7*  --  7.2* 7.7*  MG  --  1.6*  --  2.0  GFRNONAA >60  --  >60 >60  ANIONGAP 15  --  10 9    Recent Labs  Lab 12/17/23 1544  PROT 6.8  ALBUMIN 2.8*  AST 269*  ALT 83*  ALKPHOS 200*  BILITOT 6.2*   Lipids No results for input(s): "CHOL", "TRIG", "HDL", "LABVLDL", "LDLCALC", "CHOLHDL" in the last 168 hours.  Hematology Recent Labs  Lab 12/17/23 1544 12/18/23 0719  WBC 9.3 7.3  RBC 3.37* 2.87*  HGB 12.0* 10.2*  HCT 32.9* 28.6*  MCV 97.6 99.7  MCH 35.6* 35.5*  MCHC 36.5* 35.7  RDW 14.6 15.0  PLT 79* 52*   Thyroid  Recent Labs  Lab 12/17/23 2038  TSH 2.682  FREET4 1.17*    BNP Recent Labs  Lab 12/18/23 0025  BNP 63.7    DDimer No results for input(s): "DDIMER" in the last 168 hours.   Radiology/Studies:  DG Chest Portable 1 View Result Date: 12/17/2023 CLINICAL DATA:  Shortness of breath and tachycardia EXAM: PORTABLE CHEST 1 VIEW COMPARISON:  09/02/2023 FINDINGS: The heart size and mediastinal contours are within normal limits. Both lungs are clear. The visualized skeletal structures are unremarkable. IMPRESSION: No active disease. Electronically Signed   By: Jasmine Pang M.D.   On: 12/17/2023 22:49   CT  ABDOMEN PELVIS W CONTRAST Result Date: 12/17/2023 IMPRESSION: 1. Liver cirrhosis with portal hypertension. Moderate volume abdominopelvic ascites. 2. Small right pleural effusion. 3. Aortic atherosclerosis. Electronically Signed   By: Jasmine Pang M.D.   On: 12/17/2023 22:48   Assessment and Plan:   New onset atrial fibrillation with RVR - Patient presented 3/12 with initial EKG showing sinus rhythm. Patient started experiencing palpitations with dizziness and repeat EKG showed atrial fibrillation with rate 160 bpm.  - Likely precipitated by electrolyte abnormalities, diarrhea, and alcohol abuse - Given IV metoprolol 5 mg x3 in the ED with  improvements in rate and conversion to sinus rhythm 3/12 ~2315 - Remains in sinus rhythm at this time - Recommend K > 4 and mag > 2 - CHA2DS2VASc 2 (HTN and vascular disease) - Not a candidate for DOAC due to recurrent GI bleed with ongoing alcohol use. Likely not a good candidate for watchman device in the setting of several comorbidities.  - Continue PTA nadolol - Continue telemetry monitoring  Abdominal pain Alcoholic cirrhosis of liver with ascites - Presented with worsening ascites - Thoracentesis planned for near future - Management per IM  Hx GI bleed - Denies active bleeding at this time - Hgb down from baseline at 10.2  Alcohol abuse Tobacco abuse - Encouraged cessation - CIWA and nicotine patch per IM  Hypertension - BP stable  Diarrhea Electrolyte abnormality - Initial labs with Na 124, K 3, and mag 1.6 - Recommend K > 4 and mag > 2 - Daily BMP  Risk Assessment/Risk Scores:  { CHA2DS2-VASc Score = 2   This indicates a 2.2% annual risk of stroke. The patient's score is based upon: CHF History: 0 HTN History: 1 Diabetes History: 0 Stroke History: 0 Vascular Disease History: 1 Age Score: 0 Gender Score: 0     For questions or updates, please contact [facility] Please consult [email or web address] for contact info under    Signed, Orion Crook, PA-C  12/18/2023 12:14 PM

## 2023-12-18 NOTE — Progress Notes (Signed)
 PHARMACY CONSULT NOTE - ELECTROLYTES  Pharmacy Consult for Electrolyte Monitoring and Replacement   Recent Labs: Height: 5\' 11"  (180.3 cm) Weight: 104.3 kg (230 lb) IBW/kg (Calculated) : 75.3 Estimated Creatinine Clearance: 115.6 mL/min (by C-G formula based on SCr of 0.94 mg/dL). Potassium (mmol/L)  Date Value  12/18/2023 3.6  11/02/2013 3.7   Magnesium (mg/dL)  Date Value  13/05/6577 2.0   Calcium (mg/dL)  Date Value  46/96/2952 7.7 (L)   Calcium, Total (mg/dL)  Date Value  84/13/2440 8.9   Albumin (g/dL)  Date Value  08/03/2535 2.8 (L)  06/12/2016 4.7  11/02/2013 4.2   Phosphorus (mg/dL)  Date Value  64/40/3474 2.8   Sodium (mmol/L)  Date Value  12/18/2023 128 (L)  06/12/2016 140  11/02/2013 138   Corrected Ca: 8.7 mg/dL    (Ca 7.7  Alb 2.8)  Assessment  Omar Turner is a 53 y.o. male presenting with Abd pain,new onset Afib, ascites . PMH significant for alcohol abuse, alcoholic liver cirrhosis with ascites, esophageal varices, GI bleeding, HTN, HLD, dCHF, anemia, GERD, duodenitis, skin cancer, thrombocytopenia .  3-4 times of diarrhea each day.  Continues to drink about a pint of liquor daily. Pharmacy has been consulted to monitor and replace electrolytes.  Diet: fluid restriction MIVF: none Pertinent medications:  sodium chloride 1 g BID -Holding PTA Lasix and spironolactone due to hyponatremia   Goal of Therapy: Electrolytes WNL Goal K>4.0 and Mag >2.0   for Afib  Plan:  K 3.6   MD has ordered KCL 40 meq po BID x 2 days MD ordered BMP ordered q6h on 3/12 pm x 4 thru 3/13@1754  Check BMP, Mg, Phos with AM labs  Thank you for allowing pharmacy to be a part of this patient's care.  Angelique Blonder, PharmD Clinical Pharmacist 12/18/2023 9:05 AM

## 2023-12-19 ENCOUNTER — Inpatient Hospital Stay (HOSPITAL_COMMUNITY)
Admit: 2023-12-19 | Discharge: 2023-12-19 | Disposition: A | Discharge status: Home / Self Care | Attending: Physician Assistant | Admitting: Physician Assistant

## 2023-12-19 DIAGNOSIS — I4891 Unspecified atrial fibrillation: Secondary | ICD-10-CM

## 2023-12-19 LAB — PROTEIN, PLEURAL OR PERITONEAL FLUID: Total protein, fluid: <3 g/dL

## 2023-12-19 LAB — RENAL FUNCTION PANEL
Albumin: 2.5 g/dL — ABNORMAL LOW (ref 3.5–5.0)
Anion gap: 7 (ref 5–15)
BUN: 10 mg/dL (ref 6–20)
CO2: 26 mmol/L (ref 22–32)
Calcium: 8.2 mg/dL — ABNORMAL LOW (ref 8.9–10.3)
Chloride: 98 mmol/L (ref 98–111)
Creatinine, Ser: 0.76 mg/dL (ref 0.61–1.24)
GFR, Estimated: >60 mL/min (ref >60)
Glucose, Bld: 130 mg/dL — ABNORMAL HIGH (ref 70–99)
Phosphorus: 1.2 mg/dL — ABNORMAL LOW (ref 2.5–4.6)
Potassium: 4.2 mmol/L (ref 3.5–5.1)
Sodium: 131 mmol/L — ABNORMAL LOW (ref 135–145)

## 2023-12-19 LAB — CBC
HCT: 28.8 % — ABNORMAL LOW (ref 39.0–52.0)
Hemoglobin: 10.3 g/dL — ABNORMAL LOW (ref 13.0–17.0)
MCH: 35.2 pg — ABNORMAL HIGH (ref 26.0–34.0)
MCHC: 35.8 g/dL (ref 30.0–36.0)
MCV: 98.3 fL (ref 80.0–100.0)
Platelets: 46 10*3/uL — ABNORMAL LOW (ref 150–400)
RBC: 2.93 MIL/uL — ABNORMAL LOW (ref 4.22–5.81)
RDW: 14.6 % (ref 11.5–15.5)
WBC: 6.3 10*3/uL (ref 4.0–10.5)
nRBC: 0 % (ref 0.0–0.2)

## 2023-12-19 LAB — ALBUMIN, PLEURAL OR PERITONEAL FLUID: Albumin, Fluid: <1.5 g/dL

## 2023-12-19 LAB — GLUCOSE, PLEURAL OR PERITONEAL FLUID: Glucose, Fluid: 115 mg/dL

## 2023-12-19 LAB — LACTATE DEHYDROGENASE, PLEURAL OR PERITONEAL FLUID: LD, Fluid: 31 U/L — ABNORMAL HIGH (ref 3–23)

## 2023-12-19 LAB — MAGNESIUM: Magnesium: 2.1 mg/dL (ref 1.7–2.4)

## 2023-12-19 MED ORDER — POLYETHYLENE GLYCOL 3350 17 G PO PACK: 17.0000 g | PACK | Freq: Every day | ORAL | Status: DC | PRN

## 2023-12-19 MED ORDER — NADOLOL 20 MG PO TABS
10.0000 mg | ORAL_TABLET | Freq: Two times a day (BID) | ORAL | Status: DC
  Administered 2023-12-19: 10 mg via ORAL
  Filled 2023-12-19 (×3): qty 1

## 2023-12-19 MED ORDER — POTASSIUM PHOSPHATES 15 MMOLE/5ML IV SOLN
30.0000 mmol | Freq: Once | INTRAVENOUS | Status: AC
  Administered 2023-12-19: 30 mmol via INTRAVENOUS
  Filled 2023-12-19: qty 10

## 2023-12-19 NOTE — Assessment & Plan Note (Signed)
 Resolved

## 2023-12-19 NOTE — Assessment & Plan Note (Addendum)
 Platelets continue to decline. No signs of acute bleeding. Monitor. Platelets were 79 on admission. 43 on 12/20/2023.

## 2023-12-19 NOTE — Plan of Care (Signed)
   Problem: Education: Goal: Knowledge of General Education information will improve Description: Including pain rating scale, medication(s)/side effects and non-pharmacologic comfort measures Outcome: Progressing   Problem: Health Behavior/Discharge Planning: Goal: Ability to manage health-related needs will improve Outcome: Progressing   Problem: Clinical Measurements: Goal: Ability to maintain clinical measurements within normal limits will improve Outcome: Progressing Goal: Will remain free from infection Outcome: Progressing Goal: Diagnostic test results will improve Outcome: Progressing Goal: Respiratory complications will improve Outcome: Progressing Goal: Cardiovascular complication will be avoided Outcome: Progressing   Problem: Activity: Goal: Risk for activity intolerance will decrease Outcome: Progressing   Problem: Nutrition: Goal: Adequate nutrition will be maintained Outcome: Progressing   Problem: Pain Managment: Goal: General experience of comfort will improve and/or be controlled Outcome: Progressing   Problem: Safety: Goal: Ability to remain free from injury will improve Outcome: Progressing   Problem: Skin Integrity: Goal: Risk for impaired skin integrity will decrease Outcome: Progressing

## 2023-12-19 NOTE — Assessment & Plan Note (Signed)
 Troponins elevated at 20-21, but flat. Monitor.

## 2023-12-19 NOTE — Assessment & Plan Note (Addendum)
 Patient states that diarrhea has resolved.

## 2023-12-19 NOTE — Assessment & Plan Note (Addendum)
 The patient is actually hypotensive. Nadolol dose has been reduced by 75%. Will change this to every day only.

## 2023-12-19 NOTE — Progress Notes (Signed)
 PHARMACY CONSULT NOTE - ELECTROLYTES  Pharmacy Consult for Electrolyte Monitoring and Replacement   Recent Labs: Height: 5\' 11"  (180.3 cm) Weight: 100.7 kg (222 lb 0.1 oz) IBW/kg (Calculated) : 75.3 Estimated Creatinine Clearance: 133.6 mL/min (by C-G formula based on SCr of 0.76 mg/dL).  Potassium (mmol/L)  Date Value  12/19/2023 4.2  11/02/2013 3.7   Magnesium (mg/dL)  Date Value  16/07/9603 2.1   Calcium (mg/dL)  Date Value  54/06/8118 8.2 (L)   Calcium, Total (mg/dL)  Date Value  14/78/2956 8.9   Albumin (g/dL)  Date Value  21/30/8657 2.5 (L)  06/12/2016 4.7  11/02/2013 4.2   Phosphorus (mg/dL)  Date Value  84/69/6295 1.2 (L)   Sodium (mmol/L)  Date Value  12/19/2023 131 (L)  06/12/2016 140  11/02/2013 138   Corrected Ca: 8.7 mg/dL    (Ca 7.7  Alb 2.8)  Assessment  DONTAVIUS KEIM is a 53 y.o. male presenting with Abd pain,new onset Afib, ascites . PMH significant for alcohol abuse, alcoholic liver cirrhosis with ascites, esophageal varices, GI bleeding, HTN, HLD, dCHF, anemia, GERD, duodenitis, skin cancer, thrombocytopenia. Patient reports 3-4 times of diarrhea each day and drink about a pint of liquor daily. Pharmacy has been consulted to monitor and replace electrolytes.  Diet: regular diet with fluid restriction MIVF: none Pertinent medications:  sodium chloride 1 g BID -Holding PTA Lasix and spironolactone due to hyponatremia and hypotension  Goal of Therapy: Electrolytes WNL Goal K>4.0 and Mag >2.0   for Afib  Plan:  Electrolytes WNL and optimized for cardiac indication. No replacement needed at this time. Daily Kcl po discontinued. Renal panel with AM labs  Theadore Blunck Rodriguez-Guzman PharmD, BCPS 12/19/2023 8:04 AM

## 2023-12-19 NOTE — Assessment & Plan Note (Signed)
 Noted. Nicotine patch ordered.

## 2023-12-19 NOTE — Assessment & Plan Note (Addendum)
 There were no complaints of abdominal pain today. The patient underwent paracentesis on 12/18/2023 with removal of 4.1 liters of clear fluid. It does not appear infected. The fluid appears transudative and due to cirrhosis. Abdomen does not appear as distended today.

## 2023-12-19 NOTE — Assessment & Plan Note (Signed)
 Noted. Continue PPI. Avoid NSAIDS.

## 2023-12-19 NOTE — Assessment & Plan Note (Addendum)
 Noted. Diurese as possible, but is limited by the patient 's hypotension.

## 2023-12-19 NOTE — Assessment & Plan Note (Addendum)
 Noted. MELD of 26. Alcohol cessation strongly advised.

## 2023-12-19 NOTE — Progress Notes (Signed)
*  PRELIMINARY RESULTS* Echocardiogram 2D Echocardiogram has been performed.  Omar Turner 12/19/2023, 8:15 AM

## 2023-12-19 NOTE — Assessment & Plan Note (Addendum)
 CIWA protocol.  The patient had a CIWA of 14 last night, but has been 4 and 2 today. Continue to monitor.

## 2023-12-19 NOTE — Assessment & Plan Note (Addendum)
 Heart rate is well controlled without use of prn metoprolol. He had been continued on nadolol. Prior to admission he had been taking Nadolol 40 mg daily. On 12/18/2023 this was reduced to 20 mg daily. On the morning of 12/19/2023 the patient was hypotensive and complaining of dizziness and fatigue. Th nadolol was again reduced by half. It was not discontinued out of concern for atrial fibrillation with RVR. Continue to monitor.  On the morning of 12/20/2023 the patient was complaining of fatigue and dizziness. Will reduce dose of nadolol to 10 mg daily.

## 2023-12-19 NOTE — Progress Notes (Signed)
 Progress Note   Patient: Omar Turner:[id number] DOB: 27-Mar-1971 DOA: 12/17/2023     2 DOS: the patient was seen and examined on 12/19/2023   Brief hospital course: Omar Turner is a 53 y.o. male with medical history significant of alcohol abuse, alcoholic liver cirrhosis with ascites, esophageal varices, GI bleeding, HTN, HLD, dCHF, anemia, GERD, duodenitis, skin cancer, who presents with abdominal pain, abdominal distention and heart racing.    Patient is admitted for new onset Afib with RVR, Alcohol abuse, ascitis, hyponatremia, hypokalemia.    On 12/19/2023 the patient reports that he is dizzy and fatigued. Potassium and magnesium are replete, but phosphorous is low. Will supplement.  Assessment and Plan: Atrial fibrillation with RVR (HCC) Heart rate is well controlled without use of prn metoprolol. He had been continued on nadolol. Prior to admission he had been taking Nadolol 40 mg daily. On 12/18/2023 this was reduced to 20 mg daily. On the morning of 12/19/2023 the patient was hypotensive and complaining of dizziness and fatigue. Th nadolol was again reduced by half. It was not discontinued out of concern for atrial fibrillation with RVR. Continue to monitor.  Abdominal pain The patient continues to complain of abdominal discomfort. The patient underwent paracentesis on 12/18/2023 with removal of 4.1 liters of clear fluid. It does not appear infected. The fluid appears transudative and due to cirrhosis.   Alcohol abuse CIWA protocol. Patient with CIWA of 8 this morning. Continue to monitor.  Alcoholic cirrhosis of liver with ascites (HCC) Noted. MELD of 26. Alcohol cessation strongly advised.  Chronic diastolic CHF (congestive heart failure) (HCC) Noted. Diurese as possible, but is limited by the patient 's hypotension.  Duodenitis Noted. Continue PPI. Avoid NSAIDS.  Myocardial injury Troponins elevated at 20-21, but flat. Monitor.  Hypertension The patient is actually  hypotensive. Nadolol dose has been reduced by 75%.  Tobacco abuse Noted. Nicotine patch ordered.  Thrombocytopenia (HCC) Platelets continue to decline. No signs of acute bleeding. Monitor. Platelets were 79 on admission. 46 on 12/19/2023.  Diarrhea Patient states that diarrhea has diminished greatly. He states that he has only had 2 small BM's today.  Electrolyte abnormality Potassium and magnesium are replete, but phosphorous is very low at 1.2. Will supplement and recheck later today.  Hypokalemia Resolved.  Hypomagnesemia Resolved.  Hyponatremia Improving. Na 124 on presentation. 131 today. Continue to monitor.        Subjective: The patient is resting comfortably. No new complaints.  Physical Exam: Vitals:   12/19/23 1008 12/19/23 1309 12/19/23 1334 12/19/23 1533  BP: 99/68 99/68 93/63  96/66  Pulse: 80 80 81 84  Resp:      Temp:   97.6 F (36.4 C)   TempSrc:      SpO2:   96%   Weight:      Height:       Exam:  Constitutional:  The patient is awake, alert, and oriented x 3. No acute distress. Respiratory:  No increased work of breathing. No wheezes, rales, or rhonchi No tactile fremitus Cardiovascular:  Regular rate and rhythm No murmurs, ectopy, or gallups. No lateral PMI. No thrills. Abdomen:  Abdomen is soft, non-distended Diffuse mild tenderness. No hernias, masses, or organomegaly Normoactive bowel sounds.  Musculoskeletal:  No cyanosis, clubbing, or edema Skin:  No rashes, lesions, ulcers palpation of skin: no induration or nodules Neurologic:  CN 2-12 intact Sensation all 4 extremities intact Psychiatric:  Mental status Mood, affect appropriate Orientation to person, place, time  judgment and  insight appear intact  Data Reviewed:  CBC BMP  Family Communication: None available  Disposition: Status is: Inpatient Remains inpatient appropriate because: Need for ongling monitoring and treatment.  Planned Discharge Destination:  Home    Time spent: 34 minutes  Author: Beila Purdie, DO 12/19/2023 5:39 PM  For on call review [email or web address].

## 2023-12-19 NOTE — Assessment & Plan Note (Addendum)
 Potassium, magnesium, and phosphorous are replete. Monitor.

## 2023-12-19 NOTE — Assessment & Plan Note (Addendum)
 Improving. Na 124 on presentation. 135 today. Continue to monitor.

## 2023-12-20 DIAGNOSIS — I4891 Unspecified atrial fibrillation: Secondary | ICD-10-CM | POA: Diagnosis not present

## 2023-12-20 LAB — AMMONIA: Ammonia: 27 umol/L (ref 9–35)

## 2023-12-20 LAB — COMPREHENSIVE METABOLIC PANEL
ALT: 63 U/L — ABNORMAL HIGH (ref 0–44)
AST: 160 U/L — ABNORMAL HIGH (ref 15–41)
Albumin: 2.4 g/dL — ABNORMAL LOW (ref 3.5–5.0)
Alkaline Phosphatase: 160 U/L — ABNORMAL HIGH (ref 38–126)
Anion gap: 6 (ref 5–15)
BUN: 11 mg/dL (ref 6–20)
CO2: 25 mmol/L (ref 22–32)
Calcium: 8 mg/dL — ABNORMAL LOW (ref 8.9–10.3)
Chloride: 102 mmol/L (ref 98–111)
Creatinine, Ser: 0.73 mg/dL (ref 0.61–1.24)
GFR, Estimated: >60 mL/min (ref >60)
Glucose, Bld: 130 mg/dL — ABNORMAL HIGH (ref 70–99)
Potassium: 3.9 mmol/L (ref 3.5–5.1)
Sodium: 133 mmol/L — ABNORMAL LOW (ref 135–145)
Total Bilirubin: 4.1 mg/dL — ABNORMAL HIGH (ref 0.0–1.2)
Total Protein: 5.9 g/dL — ABNORMAL LOW (ref 6.5–8.1)

## 2023-12-20 LAB — CBC
HCT: 30.7 % — ABNORMAL LOW (ref 39.0–52.0)
Hemoglobin: 10.8 g/dL — ABNORMAL LOW (ref 13.0–17.0)
MCH: 35 pg — ABNORMAL HIGH (ref 26.0–34.0)
MCHC: 35.2 g/dL (ref 30.0–36.0)
MCV: 99.4 fL (ref 80.0–100.0)
Platelets: 43 10*3/uL — ABNORMAL LOW (ref 150–400)
RBC: 3.09 MIL/uL — ABNORMAL LOW (ref 4.22–5.81)
RDW: 14.9 % (ref 11.5–15.5)
WBC: 6.3 10*3/uL (ref 4.0–10.5)
nRBC: 0 % (ref 0.0–0.2)

## 2023-12-20 LAB — PHOSPHORUS: Phosphorus: 2.9 mg/dL (ref 2.5–4.6)

## 2023-12-20 LAB — GLUCOSE, CAPILLARY: Glucose-Capillary: 142 mg/dL — ABNORMAL HIGH (ref 70–99)

## 2023-12-20 MED ORDER — ACETAMINOPHEN 325 MG PO TABS
650.0000 mg | ORAL_TABLET | Freq: Two times a day (BID) | ORAL | Status: DC | PRN
  Administered 2023-12-20 – 2023-12-25 (×4): 650 mg via ORAL
  Filled 2023-12-20 (×4): qty 2

## 2023-12-20 MED ORDER — POTASSIUM CHLORIDE CRYS ER 20 MEQ PO TBCR
40.0000 meq | EXTENDED_RELEASE_TABLET | Freq: Once | ORAL | Status: AC
  Administered 2023-12-20: 40 meq via ORAL
  Filled 2023-12-20: qty 2

## 2023-12-20 MED ORDER — NADOLOL 20 MG PO TABS
10.0000 mg | ORAL_TABLET | Freq: Every day | ORAL | Status: DC
Start: 2023-12-21 — End: 2023-12-26
  Administered 2023-12-21 – 2023-12-26 (×6): 10 mg via ORAL
  Filled 2023-12-20 (×6): qty 1

## 2023-12-20 NOTE — Plan of Care (Addendum)
 On bedside shift rounds, patient indicated he's having difficulty comprehending/retaining  text and/or some situations.  Stated, "this is NOT normal for me."  Patient exhibiting no other s/sx of a stroke.  Dr. Informed and CBG completed as requested (142).  Dr. Revonda Standard-  Prior Ammonia level was high  - running new labs and may need lactulose to assist.  Ammonia result 27.

## 2023-12-20 NOTE — Progress Notes (Signed)
       CROSS COVER NOTE  NAME: RYETT HAMMAN MRN: [id number] DOB : [birth year] ATTENDING PHYSICIAN: Fran Lowes, DO    Date of Service   12/20/2023   HPI/Events of Note   Nurse reports via secure chat Good evening - On rounds patient expressed concerns with difficulty comprehending text or the situation. Stated, " this is NOT normal for me." Ammonia levels were high last time checked - possible to check again?  Patient admitted with liver cirrhosis, has had a couple paracentesis for ascites management with last being today Also endorses itching all over Last NH3 level 46 on 3/13. Not currently ion lactulose or rifaximin  Interventions   Assessment/Plan:    12/20/2023    8:33 PM 12/20/2023    5:05 PM 12/20/2023   12:25 PM  Vitals with BMI  Systolic 89 123 88  Diastolic 65 77 62  Pulse 84 79 74   Afebrile CBG 142 Bedside patient reports feeling foggy and having difficulty texting with finiding words. No focal neruo deficits.   Repeat NH3 level - initiate lactulose/rifaximin if indicated Will need referral for ongoing ascites management at discharge        Donnie Mesa NP [facility] Cross Cover 7pm-7am - check amion for availability Pager [phone]

## 2023-12-20 NOTE — Progress Notes (Signed)
 PHARMACY CONSULT NOTE - ELECTROLYTES  Pharmacy Consult for Electrolyte Monitoring and Replacement   Recent Labs: Height: 5\' 11"  (180.3 cm) Weight: 101.1 kg (222 lb 14.2 oz) IBW/kg (Calculated) : 75.3 Estimated Creatinine Clearance: 133.8 mL/min (by C-G formula based on SCr of 0.73 mg/dL).  Potassium (mmol/L)  Date Value  12/20/2023 3.9  11/02/2013 3.7   Magnesium (mg/dL)  Date Value  78/29/5621 2.1   Calcium (mg/dL)  Date Value  30/86/5784 8.0 (L)   Calcium, Total (mg/dL)  Date Value  69/62/9528 8.9   Albumin (g/dL)  Date Value  41/32/4401 2.4 (L)  06/12/2016 4.7  11/02/2013 4.2   Phosphorus (mg/dL)  Date Value  02/72/5366 2.9   Sodium (mmol/L)  Date Value  12/20/2023 133 (L)  06/12/2016 140  11/02/2013 138   Corrected Ca: 8.7 mg/dL    (Ca 7.7  Alb 2.8)  Assessment  ROCKWELL ZENTZ is a 53 y.o. male presenting with Abd pain,new onset Afib, ascites . PMH significant for alcohol abuse, alcoholic liver cirrhosis with ascites, esophageal varices, GI bleeding, HTN, HLD, dCHF, anemia, GERD, duodenitis, skin cancer, thrombocytopenia. Patient reports 3-4 times of diarrhea each day and drink about a pint of liquor daily. Pharmacy has been consulted to monitor and replace electrolytes.  Diet: regular diet with fluid restriction MIVF: none Pertinent medications:  sodium chloride 1 g BID -Holding PTA Lasix and spironolactone due to hyponatremia and hypotension  Goal of Therapy: Electrolytes WNL Goal K>4.0 and Mag >2.0   for Afib  Plan:  Electrolytes WNL and optimized for cardiac indication. No replacement needed at this time. Daily Kcl po discontinued. Renal panel with AM labs  Bettey Costa, PharmD Clinical Pharmacist 12/20/2023 7:12 AM

## 2023-12-20 NOTE — Plan of Care (Signed)

## 2023-12-20 NOTE — Plan of Care (Signed)

## 2023-12-20 NOTE — Progress Notes (Signed)
 Progress Note   Patient: Omar Turner JXB:[id number] DOB: 1971-08-30 DOA: 12/17/2023     3 DOS: the patient was seen and examined on 12/20/2023   Brief hospital course: HAYDAN MANSOURI is a 53 y.o. male with medical history significant of alcohol abuse, alcoholic liver cirrhosis with ascites, esophageal varices, GI bleeding, HTN, HLD, dCHF, anemia, GERD, duodenitis, skin cancer, who presents with abdominal pain, abdominal distention and heart racing.    Patient is admitted for new onset Afib with RVR, Alcohol abuse, ascitis, hyponatremia, hypokalemia.    On 12/20/2023 the patient again reports that he is dizzy and fatigued. Potassium and magnesium are replete, but phosphorous is low. Will supplement.  Will reduce nadolol again to 10 mg daily.   Assessment and Plan: Atrial fibrillation with RVR (HCC) Heart rate is well controlled without use of prn metoprolol. He had been continued on nadolol. Prior to admission he had been taking Nadolol 40 mg daily. On 12/18/2023 this was reduced to 20 mg daily. On the morning of 12/19/2023 the patient was hypotensive and complaining of dizziness and fatigue. Th nadolol was again reduced by half. It was not discontinued out of concern for atrial fibrillation with RVR. Continue to monitor.  On the morning of 12/20/2023 the patient was complaining of fatigue and dizziness. Will reduce dose of nadolol to 10 mg daily.  Abdominal pain There were no complaints of abdominal pain today. The patient underwent paracentesis on 12/18/2023 with removal of 4.1 liters of clear fluid. It does not appear infected. The fluid appears transudative and due to cirrhosis. Abdomen does not appear as distended today.  Alcohol abuse CIWA protocol.  The patient had a CIWA of 14 last night, but has been 4 and 2 today. Continue to monitor.   Alcoholic cirrhosis of liver with ascites (HCC) Noted. MELD of 26. Alcohol cessation strongly advised.  Chronic diastolic CHF (congestive heart  failure) (HCC) Noted. Diurese as possible, but is limited by the patient 's hypotension. He is 610 cc negative since admission.  Duodenitis Noted. Continue PPI. Avoid NSAIDS.  Myocardial injury Troponins elevated at 20-21, but flat. Monitor.  Hypertension The patient is actually hypotensive. Nadolol dose has been reduced by 75%. Will change this to every day only.  Tobacco abuse Noted. Nicotine patch ordered.  Thrombocytopenia (HCC) Platelets continue to decline. No signs of acute bleeding. Monitor. Platelets were 79 on admission. 43 on 12/20/2023.  Diarrhea Patient states that diarrhea has resolved.  Electrolyte abnormality Potassium and magnesium are replete, but phosphorous was very low at 1.2 on 12/20/2023. Phos is replete at 2.9 today.Will supplement and recheck later today.  Hypokalemia Resolved.  Hypomagnesemia Resolved.  Hyponatremia Improving. Na 124 on presentation. 133 today. Continue to monitor.        Subjective: The patient is resting comfortably. No new complaints.  Physical Exam: Vitals:   12/20/23 0524 12/20/23 0940 12/20/23 1225 12/20/23 1705  BP:  92/63 (!) 88/62 123/77  Pulse:  81 74 79  Resp:   16   Temp:  97.8 F (36.6 C) 97.8 F (36.6 C) 98.6 F (37 C)  TempSrc:  Oral  Oral  SpO2:  98% 97% 99%  Weight: 101.1 kg     Height:       Exam:  Constitutional:  The patient is awake, alert, and oriented x 3. No acute distress. Respiratory:  No increased work of breathing. No wheezes, rales, or rhonchi No tactile fremitus Cardiovascular:  Regular rate and rhythm No murmurs, ectopy, or gallups.  No lateral PMI. No thrills. Abdomen:  Abdomen is soft, non-distended Diffuse mild tenderness. No hernias, masses, or organomegaly Normoactive bowel sounds.  Musculoskeletal:  No cyanosis, clubbing, or edema Skin:  No rashes, lesions, ulcers palpation of skin: no induration or nodules Neurologic:  CN 2-12 intact Sensation all 4 extremities  intact Psychiatric:  Mental status Mood, affect appropriate Orientation to person, place, time  judgment and insight appear intact  Data Reviewed:  CBC BMP  Family Communication: None available  Disposition: Status is: Inpatient Remains inpatient appropriate because: Need for ongling monitoring and treatment.  Planned Discharge Destination: Home    Time spent: 34 minutes  Author: Meygan Kyser, DO 12/20/2023 5:31 PM  For on call review [email or web address].

## 2023-12-21 DIAGNOSIS — I4891 Unspecified atrial fibrillation: Secondary | ICD-10-CM | POA: Diagnosis not present

## 2023-12-21 DIAGNOSIS — K7682 Hepatic encephalopathy: Secondary | ICD-10-CM | POA: Insufficient documentation

## 2023-12-21 LAB — BASIC METABOLIC PANEL
Anion gap: 8 (ref 5–15)
BUN: 13 mg/dL (ref 6–20)
CO2: 24 mmol/L (ref 22–32)
Calcium: 8 mg/dL — ABNORMAL LOW (ref 8.9–10.3)
Chloride: 101 mmol/L (ref 98–111)
Creatinine, Ser: 0.68 mg/dL (ref 0.61–1.24)
GFR, Estimated: >60 mL/min (ref >60)
Glucose, Bld: 133 mg/dL — ABNORMAL HIGH (ref 70–99)
Potassium: 4.4 mmol/L (ref 3.5–5.1)
Sodium: 133 mmol/L — ABNORMAL LOW (ref 135–145)

## 2023-12-21 LAB — CBC
HCT: 30.4 % — ABNORMAL LOW (ref 39.0–52.0)
Hemoglobin: 10.6 g/dL — ABNORMAL LOW (ref 13.0–17.0)
MCH: 34.9 pg — ABNORMAL HIGH (ref 26.0–34.0)
MCHC: 34.9 g/dL (ref 30.0–36.0)
MCV: 100 fL (ref 80.0–100.0)
Platelets: 50 10*3/uL — ABNORMAL LOW (ref 150–400)
RBC: 3.04 MIL/uL — ABNORMAL LOW (ref 4.22–5.81)
RDW: 15.2 % (ref 11.5–15.5)
WBC: 6.6 10*3/uL (ref 4.0–10.5)
nRBC: 0 % (ref 0.0–0.2)

## 2023-12-21 LAB — PHOSPHORUS: Phosphorus: 3.6 mg/dL (ref 2.5–4.6)

## 2023-12-21 LAB — AMMONIA: Ammonia: 36 umol/L — ABNORMAL HIGH (ref 9–35)

## 2023-12-21 LAB — MAGNESIUM: Magnesium: 1.9 mg/dL (ref 1.7–2.4)

## 2023-12-21 MED ORDER — LACTULOSE 10 GM/15ML PO SOLN
20.0000 g | Freq: Three times a day (TID) | ORAL | Status: DC
  Administered 2023-12-21 – 2023-12-23 (×4): 20 g via ORAL
  Filled 2023-12-21 (×5): qty 30

## 2023-12-21 MED ORDER — MAGNESIUM SULFATE 2 GM/50ML IV SOLN
2.0000 g | Freq: Once | INTRAVENOUS | Status: AC
  Administered 2023-12-21: 2 g via INTRAVENOUS
  Filled 2023-12-21: qty 50

## 2023-12-21 NOTE — Assessment & Plan Note (Signed)
 The patient is notably lethargic this am. Per nursing he had 2 BM's yesterday and one this morning. Will check an ammonium level, but clinically this is HE. Lactulose has been started.

## 2023-12-21 NOTE — Progress Notes (Signed)
 PHARMACY CONSULT NOTE - ELECTROLYTES  Pharmacy Consult for Electrolyte Monitoring and Replacement   Recent Labs: Height: 5\' 11"  (180.3 cm) Weight: 104.9 kg (231 lb 3.2 oz) IBW/kg (Calculated) : 75.3 Estimated Creatinine Clearance: 136.1 mL/min (by C-G formula based on SCr of 0.68 mg/dL).  Potassium (mmol/L)  Date Value  12/21/2023 4.4  11/02/2013 3.7   Magnesium (mg/dL)  Date Value  40/34/7425 1.9   Calcium (mg/dL)  Date Value  95/63/8756 8.0 (L)   Calcium, Total (mg/dL)  Date Value  43/32/9518 8.9   Albumin (g/dL)  Date Value  84/16/6063 2.4 (L)  06/12/2016 4.7  11/02/2013 4.2   Phosphorus (mg/dL)  Date Value  01/60/1093 3.6   Sodium (mmol/L)  Date Value  12/21/2023 133 (L)  06/12/2016 140  11/02/2013 138    Assessment  Omar Turner is a 53 y.o. male presenting with Abd pain,new onset Afib, ascites . PMH significant for alcohol abuse, alcoholic liver cirrhosis with ascites, esophageal varices, GI bleeding, HTN, HLD, dCHF, anemia, GERD, duodenitis, skin cancer, thrombocytopenia. Patient reports 3-4 times of diarrhea each day and drink about a pint of liquor daily. Pharmacy has been consulted to monitor and replace electrolytes.  Diet: regular diet with fluid restriction MIVF: none Pertinent medications:  sodium chloride 1 g BID -Holding PTA Lasix and spironolactone due to hyponatremia and hypotension  Goal of Therapy: Electrolytes WNL Goal K>4.0 and Mag >2.0   for Afib  Plan:  Mg 1.9: Mag sulfate 2g IV x 1 BMP, Mag, Phos with AM labs  Bettey Costa, PharmD Clinical Pharmacist 12/21/2023 7:26 AM

## 2023-12-21 NOTE — Progress Notes (Signed)
 Progress Note   Patient: Omar Turner:[id number] DOB: 04/03/1971 DOA: 12/17/2023     4 DOS: the patient was seen and examined on 12/21/2023   Brief hospital course: Omar Turner is a 53 y.o. male with medical history significant of alcohol abuse, alcoholic liver cirrhosis with ascites, esophageal varices, GI bleeding, HTN, HLD, dCHF, anemia, GERD, duodenitis, skin cancer, who presents with abdominal pain, abdominal distention and heart racing.    Patient is admitted for new onset Afib with RVR, Alcohol abuse, ascitis, hyponatremia, hypokalemia.    On 12/20/2023 the patient again reports that he is dizzy and fatigued. Potassium and magnesium are replete, but phosphorous is low. Will supplement.  On 12/21/2023 the patient is lethargic and difficult to interact with. His mother who is at bedside agrees. He had 1 BM on 12/19/2023, 2 on 12/20/2023, and 1 this morning. Will check ammonium level and start lactulose.  Assessment and Plan: Atrial fibrillation with RVR (HCC) Heart rate is well controlled without use of prn metoprolol. He had been continued on nadolol. Prior to admission he had been taking Nadolol 40 mg daily. On 12/18/2023 this was reduced to 20 mg daily. On the morning of 12/19/2023 the patient was hypotensive and complaining of dizziness and fatigue. Th nadolol was again reduced by half. It was not discontinued out of concern for atrial fibrillation with RVR. Continue to monitor.  On the morning of 12/20/2023 the patient was complaining of fatigue and dizziness. Will reduce dose of nadolol to 10 mg daily.  Abdominal pain There were no complaints of abdominal pain today. The patient underwent paracentesis on 12/18/2023 with removal of 4.1 liters of clear fluid. It does not appear infected. The fluid appears transudative and due to cirrhosis. Abdomen does not appear as distended today as it has been. Soft and non-tender to palpation.  Alcohol abuse CIWA protocol.  Score of 10 at  midnight last night and 7 at noon. Continue to monitor.  Alcoholic cirrhosis of liver with ascites (HCC) Noted. MELD of 26. Alcohol cessation strongly advised.   Chronic diastolic CHF (congestive heart failure) (HCC) Noted. Diurese as possible, but is limited by the patient 's hypotension. He is 610 cc negative since admission.  Duodenitis Noted. Continue PPI. Avoid NSAIDS.  Myocardial injury Troponins elevated at 20-21, but flat. Monitor.  Hypertension The patient is actually hypotensive. Nadolol dose has been reduced by 75%. Will change this to every day only. He appears to be tolerating this dose.  Tobacco abuse Noted. Nicotine patch ordered.  Thrombocytopenia (HCC) Platelets are beginning to recover. They have increased from a nadir of 42 on 12/20/2023 to 50 this morning.  No signs of acute bleeding.  Diarrhea Patient states that diarrhea has resolved.  Electrolyte abnormality Potassium and magnesium are replete, but phosphorous was very low at 1.2 on 12/20/2023. Phos is replete at 3.6 today.   Hypokalemia Resolved.  Hypomagnesemia Resolved.  Hyponatremia Improving. Na 124 on presentation. 133 today. Continue to monitor.  Acute hepatic encephalopathy (HCC) The patient is notably lethargic this am. Per nursing he had 2 BM's yesterday and one this morning. Will check an ammonium level, but clinically this is HE. Lactulose has been started.        Subjective: The patient is resting comfortably. No new complaints.  Physical Exam: Vitals:   12/21/23 0500 12/21/23 0846 12/21/23 1200 12/21/23 1251  BP:  105/71 108/67 102/65  Pulse:  69 76 79  Resp:      Temp:  97.7 F (  36.5 C)  (!) 96.9 F (36.1 C)  TempSrc:      SpO2:  96%  96%  Weight: 104.9 kg     Height:       Exam:  Constitutional:  The patient is very somnolent. Only briefly and transiently aware of my presence. No acute distress. Respiratory:  No increased work of breathing. No wheezes, rales, or  rhonchi No tactile fremitus Cardiovascular:  Regular rate and rhythm No murmurs, ectopy, or gallups. No lateral PMI. No thrills. Abdomen:  Abdomen is soft, non-distended Diffuse mild tenderness. No hernias, masses, or organomegaly Normoactive bowel sounds.  Musculoskeletal:  No cyanosis, clubbing, or edema Skin:  No rashes, lesions, ulcers palpation of skin: no induration or nodules Neurologic:  The patient is unable to cooperate with exam due to his inability to cooperate with exam. Psychiatric:  The patient is unable to cooperate with exam due to his inability to cooperate with exam.  Data Reviewed:  CBC BMP  Family Communication: None available  Disposition: Status is: Inpatient Remains inpatient appropriate because: Need for ongling monitoring and treatment.  Planned Discharge Destination: Home    Time spent: 34 minutes  Author: Arena Lindahl, DO 12/21/2023 3:06 PM  For on call review [email or web address].

## 2023-12-22 DIAGNOSIS — I4891 Unspecified atrial fibrillation: Secondary | ICD-10-CM | POA: Diagnosis not present

## 2023-12-22 LAB — CBC
HCT: 33.9 % — ABNORMAL LOW (ref 39.0–52.0)
Hemoglobin: 11.6 g/dL — ABNORMAL LOW (ref 13.0–17.0)
MCH: 35.3 pg — ABNORMAL HIGH (ref 26.0–34.0)
MCHC: 34.2 g/dL (ref 30.0–36.0)
MCV: 103 fL — ABNORMAL HIGH (ref 80.0–100.0)
Platelets: 61 10*3/uL — ABNORMAL LOW (ref 150–400)
RBC: 3.29 MIL/uL — ABNORMAL LOW (ref 4.22–5.81)
RDW: 15.6 % — ABNORMAL HIGH (ref 11.5–15.5)
WBC: 7.1 10*3/uL (ref 4.0–10.5)
nRBC: 0 % (ref 0.0–0.2)

## 2023-12-22 LAB — ECHOCARDIOGRAM COMPLETE
AR max vel: 2.26 cm2
AV Area VTI: 2.26 cm2
AV Area mean vel: 2.23 cm2
AV Mean grad: 4 mmHg
AV Peak grad: 7 mmHg
Ao pk vel: 1.32 m/s
Area-P 1/2: 3.24 cm2
Height: 71 in
MV VTI: 2.34 cm2
S' Lateral: 3.3 cm
Weight: 3552.05 [oz_av]

## 2023-12-22 LAB — COMPREHENSIVE METABOLIC PANEL
ALT: 84 U/L — ABNORMAL HIGH (ref 0–44)
AST: 171 U/L — ABNORMAL HIGH (ref 15–41)
Albumin: 2.5 g/dL — ABNORMAL LOW (ref 3.5–5.0)
Alkaline Phosphatase: 165 U/L — ABNORMAL HIGH (ref 38–126)
Anion gap: 8 (ref 5–15)
BUN: 15 mg/dL (ref 6–20)
CO2: 25 mmol/L (ref 22–32)
Calcium: 8.3 mg/dL — ABNORMAL LOW (ref 8.9–10.3)
Chloride: 102 mmol/L (ref 98–111)
Creatinine, Ser: 0.77 mg/dL (ref 0.61–1.24)
GFR, Estimated: >60 mL/min (ref >60)
Glucose, Bld: 132 mg/dL — ABNORMAL HIGH (ref 70–99)
Potassium: 4.3 mmol/L (ref 3.5–5.1)
Sodium: 135 mmol/L (ref 135–145)
Total Bilirubin: 3.4 mg/dL — ABNORMAL HIGH (ref 0.0–1.2)
Total Protein: 6.1 g/dL — ABNORMAL LOW (ref 6.5–8.1)

## 2023-12-22 LAB — BODY FLUID CULTURE W GRAM STAIN
Culture: NO GROWTH
Gram Stain: NONE SEEN

## 2023-12-22 LAB — AMMONIA: Ammonia: 37 umol/L — ABNORMAL HIGH (ref 9–35)

## 2023-12-22 LAB — MAGNESIUM: Magnesium: 2.1 mg/dL (ref 1.7–2.4)

## 2023-12-22 LAB — PHOSPHORUS: Phosphorus: 3.8 mg/dL (ref 2.5–4.6)

## 2023-12-22 LAB — CYTOLOGY - NON PAP

## 2023-12-22 MED ORDER — LORAZEPAM 2 MG/ML IJ SOLN: 1.0000 mg | INTRAMUSCULAR | Status: AC | PRN

## 2023-12-22 MED ORDER — LORAZEPAM 1 MG PO TABS
1.0000 mg | ORAL_TABLET | ORAL | Status: AC | PRN
  Administered 2023-12-22: 2 mg via ORAL
  Filled 2023-12-22: qty 2

## 2023-12-22 NOTE — Progress Notes (Addendum)
 PHARMACY CONSULT NOTE - ELECTROLYTES  Pharmacy Consult for Electrolyte Monitoring and Replacement   Recent Labs: Height: 5\' 11"  (180.3 cm) Weight: 104.9 kg (231 lb 3.2 oz) IBW/kg (Calculated) : 75.3 Estimated Creatinine Clearance: 136.1 mL/min (by C-G formula based on SCr of 0.77 mg/dL).  Potassium (mmol/L)  Date Value  12/22/2023 4.3  11/02/2013 3.7   Magnesium (mg/dL)  Date Value  65/78/4696 2.1   Calcium (mg/dL)  Date Value  29/52/8413 8.3 (L)   Calcium, Total (mg/dL)  Date Value  24/40/1027 8.9   Albumin (g/dL)  Date Value  25/36/6440 2.5 (L)  06/12/2016 4.7  11/02/2013 4.2   Phosphorus (mg/dL)  Date Value  34/74/2595 3.8   Sodium (mmol/L)  Date Value  12/22/2023 135  06/12/2016 140  11/02/2013 138    Assessment  Omar Turner is a 53 y.o. male presenting with Abd pain,new onset Afib, ascites . PMH significant for alcohol abuse, alcoholic liver cirrhosis with ascites, esophageal varices, GI bleeding, HTN, HLD, dCHF, anemia, GERD, duodenitis, skin cancer, thrombocytopenia. Patient reports 3-4 times of diarrhea each day and drink about a pint of liquor daily. Pharmacy has been consulted to monitor and replace electrolytes.  Diet: regular diet with fluid restriction MIVF: none Pertinent medications:  sodium chloride 1 g BID -Holding PTA Lasix and spironolactone due to hyponatremia and hypotension  Goal of Therapy: Electrolytes WNL Goal K>4.0 and Mag >2.0   for Afib  Plan:  No replacement indicated for this morning BMP with AM labs  Britanee Vanblarcom Rodriguez-Guzman PharmD, BCPS 12/22/2023 9:38 AM

## 2023-12-22 NOTE — Plan of Care (Signed)
  Problem: Clinical Measurements: Goal: Ability to maintain clinical measurements within normal limits will improve Outcome: Progressing   Problem: Pain Managment: Goal: General experience of comfort will improve and/or be controlled Outcome: Progressing   Problem: Safety: Goal: Ability to remain free from injury will improve Outcome: Progressing   Problem: Elimination: Goal: Will not experience complications related to bowel motility Outcome: Progressing   Problem: Skin Integrity: Goal: Risk for impaired skin integrity will decrease Outcome: Progressing

## 2023-12-22 NOTE — Progress Notes (Signed)
 Progress Note   Patient: Omar Turner XBJ:[id number] DOB: 1971/08/12 DOA: 12/17/2023     5 DOS: the patient was seen and examined on 12/22/2023   Brief hospital course: Omar Turner is a 53 y.o. male with medical history significant of alcohol abuse, alcoholic liver cirrhosis with ascites, esophageal varices, GI bleeding, HTN, HLD, dCHF, anemia, GERD, duodenitis, skin cancer, who presents with abdominal pain, abdominal distention and heart racing.    Patient is admitted for new onset Afib with RVR, Alcohol abuse, ascitis, hyponatremia, hypokalemia.    On 12/20/2023 the patient again reports that he is dizzy and fatigued. Potassium and magnesium are replete, but phosphorous is low. Will supplement.  On 12/21/2023 the patient is lethargic and difficult to interact with. His mother who is at bedside agrees. He had 1 BM on 12/19/2023, 2 on 12/20/2023, and 1 this morning. Will check ammonium level and start lactulose.  On 12/22/2023 the patient is somnolent after having received Ativan for CIWA of 11, but he is easily rousable and alert and oriented once awake. Continue lactulose.  Assessment and Plan: Atrial fibrillation with RVR (HCC) Heart rate is well controlled without use of prn metoprolol. He had been continued on nadolol. Prior to admission he had been taking Nadolol 40 mg daily. On 12/18/2023 this was reduced to 20 mg daily. On the morning of 12/19/2023 the patient was hypotensive and complaining of dizziness and fatigue. Th nadolol was again reduced by half. It was not discontinued out of concern for atrial fibrillation with RVR. Continue to monitor.  On the morning of 12/20/2023 the patient was complaining of fatigue and dizziness. Will reduce dose of nadolol to 10 mg daily.  Abdominal pain There were no complaints of abdominal pain today. The patient underwent paracentesis on 12/18/2023 with removal of 4.1 liters of clear fluid. It does not appear infected. The fluid appears transudative  and due to cirrhosis. Abdomen does not appear as distended today as it has been. Soft and non-tender to palpation.  Alcohol abuse CIWA protocol.  Score of 11 this morning.Continue to monitor.  Alcoholic cirrhosis of liver with ascites (HCC) Noted. MELD of 26. Alcohol cessation strongly advised.   Chronic diastolic CHF (congestive heart failure) (HCC) Noted. Diurese as possible, but is limited by the patient 's hypotension.  Duodenitis Noted. Continue PPI. Avoid NSAIDS.  Myocardial injury Troponins elevated at 20-21, but flat. Monitor.  Hypertension Blood pressures are better today on lower dose of nadolol.  Tobacco abuse Noted. Nicotine patch ordered.  Thrombocytopenia (HCC) Platelets are beginning to recover. They have increased from a nadir of 42 on 12/20/2023 to 61 this morning.  No signs of acute bleeding.  Diarrhea Patient states that diarrhea has resolved.  Electrolyte abnormality Potassium, magnesium, and phosphorous are replete. Monitor.  Hypokalemia Resolved.  Hypomagnesemia Resolved.  Hyponatremia Improving. Na 124 on presentation. 135 today. Continue to monitor.  Acute hepatic encephalopathy (HCC) The patient is much more alert today. Per nursing he had 3 BM's today. Continue lactulose tid for another day to see if patient can have 3 BM's on only 2 doses.         Subjective: The patient is resting comfortably. No new complaints.  Physical Exam: Vitals:   12/22/23 0932 12/22/23 1051 12/22/23 1207 12/22/23 1623  BP: 109/75  109/80 100/72  Pulse: 90 84 89 90  Resp:   18 20  Temp:   97.8 F (36.6 C) 98 F (36.7 C)  TempSrc:   Oral Oral  SpO2:  98% 97%  Weight:      Height:       Exam:  Constitutional:  The patient is very somnolent. Only briefly and transiently aware of my presence. No acute distress. Respiratory:  No increased work of breathing. No wheezes, rales, or rhonchi No tactile fremitus Cardiovascular:  Regular rate and  rhythm No murmurs, ectopy, or gallups. No lateral PMI. No thrills. Abdomen:  Abdomen is soft, non-distended Diffuse mild tenderness. No hernias, masses, or organomegaly Normoactive bowel sounds.  Musculoskeletal:  No cyanosis, clubbing, or edema Skin:  No rashes, lesions, ulcers palpation of skin: no induration or nodules Neurologic:  Muscle strength 5/5 upper and lower extremities bilaterally.  CN II- XII grossly intact.  Psychiatric:  Mood and affect are congruent.  Data Reviewed:  CBC BMP  Family Communication: None available  Disposition: Status is: Inpatient Remains inpatient appropriate because: Need for ongling monitoring and treatment.  Planned Discharge Destination: Home    Time spent: 34 minutes  Author: Forestine Macho, DO 12/22/2023 5:47 PM  For on call review [email or web address].

## 2023-12-22 NOTE — Plan of Care (Signed)

## 2023-12-23 ENCOUNTER — Inpatient Hospital Stay

## 2023-12-23 DIAGNOSIS — I4891 Unspecified atrial fibrillation: Secondary | ICD-10-CM | POA: Diagnosis not present

## 2023-12-23 LAB — CBC
HCT: 30.7 % — ABNORMAL LOW (ref 39.0–52.0)
Hemoglobin: 10.5 g/dL — ABNORMAL LOW (ref 13.0–17.0)
MCH: 34.3 pg — ABNORMAL HIGH (ref 26.0–34.0)
MCHC: 34.2 g/dL (ref 30.0–36.0)
MCV: 100.3 fL — ABNORMAL HIGH (ref 80.0–100.0)
Platelets: 61 10*3/uL — ABNORMAL LOW (ref 150–400)
RBC: 3.06 MIL/uL — ABNORMAL LOW (ref 4.22–5.81)
RDW: 15.8 % — ABNORMAL HIGH (ref 11.5–15.5)
WBC: 6.5 10*3/uL (ref 4.0–10.5)
nRBC: 0 % (ref 0.0–0.2)

## 2023-12-23 LAB — MISC LABCORP TEST (SEND OUT): Labcorp test code: 9985

## 2023-12-23 LAB — BASIC METABOLIC PANEL
Anion gap: 4 — ABNORMAL LOW (ref 5–15)
BUN: 16 mg/dL (ref 6–20)
CO2: 23 mmol/L (ref 22–32)
Calcium: 8 mg/dL — ABNORMAL LOW (ref 8.9–10.3)
Chloride: 106 mmol/L (ref 98–111)
Creatinine, Ser: 0.67 mg/dL (ref 0.61–1.24)
GFR, Estimated: >60 mL/min (ref >60)
Glucose, Bld: 128 mg/dL — ABNORMAL HIGH (ref 70–99)
Potassium: 4 mmol/L (ref 3.5–5.1)
Sodium: 133 mmol/L — ABNORMAL LOW (ref 135–145)

## 2023-12-23 MED ORDER — LACTULOSE 10 GM/15ML PO SOLN
20.0000 g | Freq: Two times a day (BID) | ORAL | Status: DC
  Administered 2023-12-23 – 2023-12-24 (×2): 20 g via ORAL
  Filled 2023-12-23 (×2): qty 30

## 2023-12-23 MED ORDER — LIDOCAINE HCL (PF) 1 % IJ SOLN
10.0000 mL | Freq: Once | INTRAMUSCULAR | Status: AC
  Administered 2023-12-23: 10 mL via INTRADERMAL
  Filled 2023-12-23: qty 10

## 2023-12-23 MED ORDER — SPIRONOLACTONE 12.5 MG HALF TABLET
12.5000 mg | ORAL_TABLET | Freq: Two times a day (BID) | ORAL | Status: DC
  Administered 2023-12-23: 12.5 mg via ORAL
  Filled 2023-12-23 (×3): qty 1

## 2023-12-23 MED ORDER — FUROSEMIDE 10 MG/ML IJ SOLN
60.0000 mg | Freq: Four times a day (QID) | INTRAMUSCULAR | Status: DC
Start: 2023-12-23 — End: 2023-12-24
  Administered 2023-12-23 – 2023-12-24 (×3): 60 mg via INTRAVENOUS
  Filled 2023-12-23 (×3): qty 6

## 2023-12-23 NOTE — TOC Progression Note (Signed)
 Transition of Care [facility]) - Progression Note    Patient Details  Name: Omar Turner MRN: [id number] Date of Birth: [birth year]  [facility]) CM/SW Contact  Margarito Liner, LCSW Phone Number: 12/23/2023, 3:46 PM  Clinical Narrative:  CSW continues to follow progress.   Expected Discharge Plan and Services                                               Social Determinants of Health (SDOH) Interventions SDOH Screenings   Food Insecurity: No Food Insecurity (12/18/2023)  Housing: Low Risk  (12/18/2023)  Transportation Needs: No Transportation Needs (12/18/2023)  Utilities: Not At Risk (12/18/2023)  Financial Resource Strain: Low Risk  (12/11/2020)   Received from [facility], [facility]  Social Connections: Patient Unable To Answer (11/18/2023)  Tobacco Use: High Risk (12/17/2023)    Readmission Risk Interventions    12/18/2023    2:33 PM  Readmission Risk Prevention Plan  Transportation Screening Complete  PCP or Specialist Appt within 3-5 Days Complete  HRI or Home Care Consult Complete  Social Work Consult for Recovery Care Planning/Counseling Complete  Palliative Care Screening Not Applicable  Medication Review [employer]) Complete

## 2023-12-23 NOTE — Progress Notes (Signed)
 PHARMACY CONSULT NOTE - ELECTROLYTES  Pharmacy Consult for Electrolyte Monitoring and Replacement   Recent Labs: Height: 5\' 11"  (180.3 cm) Weight: 104.9 kg (231 lb 3.2 oz) IBW/kg (Calculated) : 75.3 Estimated Creatinine Clearance: 136.1 mL/min (by C-G formula based on SCr of 0.67 mg/dL).  Potassium (mmol/L)  Date Value  12/23/2023 4.0  11/02/2013 3.7   Magnesium (mg/dL)  Date Value  08/65/7846 2.1   Calcium (mg/dL)  Date Value  96/29/5284 8.0 (L)   Calcium, Total (mg/dL)  Date Value  13/24/4010 8.9   Albumin (g/dL)  Date Value  27/25/3664 2.5 (L)  06/12/2016 4.7  11/02/2013 4.2   Phosphorus (mg/dL)  Date Value  40/34/7425 3.8   Sodium (mmol/L)  Date Value  12/23/2023 133 (L)  06/12/2016 140  11/02/2013 138    Assessment  Omar Turner is a 53 y.o. male presenting with Abd pain,new onset Afib, ascites . PMH significant for alcohol abuse, alcoholic liver cirrhosis with ascites, esophageal varices, GI bleeding, HTN, HLD, dCHF, anemia, GERD, duodenitis, skin cancer, thrombocytopenia. Patient reports 3-4 times of diarrhea each day and drink about a pint of liquor daily. Pharmacy has been consulted to monitor and replace electrolytes.  Diet: regular diet with fluid restriction MIVF: none Pertinent medications:  sodium chloride 1 g BID -Holding PTA Lasix and spironolactone due to hyponatremia and hypotension  Goal of Therapy: Electrolytes WNL Goal K>4.0 and Mag >2.0   for Afib  Plan:  Na slightly below normal range but stable. All other electrolytes at desired range. No replacement indicated for this morning Follow AM labs  Salisa Broz Rodriguez-Guzman PharmD, BCPS 12/23/2023 8:14 AM

## 2023-12-23 NOTE — Procedures (Signed)
 PROCEDURE SUMMARY:  Successful ultrasound guided paracentesis from the right lower quadrant.  Yielded 6 liters of straw colored fluid.  No immediate complications.  The patient tolerated the procedure well.    EBL < 2 mL  The patient has required >/=2 paracenteses in a 30 day period and a screening evaluation by the [facility] Interventional Radiology Portal Hypertension Clinic has been arranged.

## 2023-12-23 NOTE — Progress Notes (Signed)
 Progress Note   Patient: Omar Turner DOB: 04-02-1971 DOA: 12/17/2023     6 DOS: the patient was seen and examined on 12/23/2023   Brief hospital course: Omar Turner is a 53 y.o. male with medical history significant of alcohol abuse, alcoholic liver cirrhosis with ascites, esophageal varices, GI bleeding, HTN, HLD, dCHF, anemia, GERD, duodenitis, skin cancer, who presents with abdominal pain, abdominal distention and heart racing. Patient is admitted for new onset Afib with RVR, Alcohol abuse, ascitis, hyponatremia, hypokalemia.    Hospital course:  3/13-successful paracentesis, 4 L transudative fluid withdrawn 3/15 -noted worsening fatigue/dizziness -electrolytes repleted 3/16-worsening lethargy, poorly arousable, elevated ammonia, initiate lactulose 3/17-improving mental status 3/18-mental status is to be at baseline, worsening volume status, reinitiate IV diuretics, IR consulted to reevaluate for possible paracentesis  Assessment and Plan:  Acute hepatic encephalopathy (HCC) -Resolved, back to baseline today  -Decrease lactulose to twice daily dosing given multiple, reported 9, stools noted over the past 24 hours  Alcohol abuse; alcoholic cirrhosis of liver with ascites (HCC) Abdominal pain; duodenitis, chronic -MELD 26 conferring a mortality risk of 20% in 3 months, discussed need for outpatient hepatology and transplant follow-up -Previous paracentesis on 3/13 successful with 4 L of transudative fluid withdrawn -Abdomen remains markedly distended, will initiate high-dose diuretics, spironolactone and have patient follow-up with IR for possible repeat para -reevaluate patient in the a.m. and continue IV diuretics if tolerating well, otherwise scheduled to discontinue 12/24/2023  Borderline hypotension -Consider midodrine if patient becomes hypotensive to ensure blood pressure for ongoing diuresis  Rule out SBP -Continue ceftriaxone through 12/24/2023 -Patient  does not meet sepsis criteria  Atrial fibrillation with RVR (HCC) -Rate controlled on nadolol, borderline hypotensive, consider transitioning to diltiazem if rate remains an issue while hypotensive -Nadolol dosing decreased to 10 mg daily given symptoms of fatigue and dizziness  Chronic diastolic CHF (congestive heart failure) (HCC) -Complicated by patient's ascites and liver failure, continue increased IV diuretics over the next 24 hours and reevaluate in the morning.  Myocardial injury Troponins elevated at 20-21, but flat. Monitor.  Hypertension Blood pressures are better today on lower dose of nadolol.  Tobacco abuse Noted. Nicotine patch ordered.  Thrombocytopenia (HCC) Platelets are beginning to recover. They have increased from a nadir of 42 on 12/20/2023 to 61 this morning.  No signs of acute bleeding.  Electrolyte abnormality Sodium, Potassium, magnesium, and phosphorous previously abnormal, continue to follow -currently within normal limits  Subjective: The patient is resting comfortably. No new complaints.  Physical Exam: Vitals:   12/22/23 2011 12/22/23 2158 12/22/23 2355 12/23/23 0510  BP: 123/76 (!) 130/92 126/89 112/74  Pulse: 91  96 77  Resp: 17  18 17   Temp: 98.7 F (37.1 C)  98 F (36.7 C) 98 F (36.7 C)  TempSrc:      SpO2: 100%  98% 98%  Weight:      Height:       Exam:  General:  Pleasantly resting in bed, No acute distress. HEENT:  Normocephalic atraumatic.  Sclerae nonicteric, noninjected.  Extraocular movements intact bilaterally. Neck:  Without mass or deformity.  Trachea is midline. Lungs: Bibasilar rales without wheeze or rhonchi. Heart: Distant, without murmurs rubs or gallops. Abdomen: Profoundly distended, moderate pain with even light palpation, noted fluid wave shift, distant bowel sounds noted. Extremities: Without cyanosis, clubbing, edema, or obvious deformity. Skin:  Warm and dry, no erythema.  Data Reviewed:  CBC BMP  Family  Communication: None available  Disposition: Status  is: Inpatient Remains inpatient appropriate because: Need for ongling monitoring and treatment.  Planned Discharge Destination: Home    Time spent: 34 minutes  Author: Carma Leaven DO 12/23/2023 7:47 AM  For on call review [email or web address].

## 2023-12-24 DIAGNOSIS — K7031 Alcoholic cirrhosis of liver with ascites: Secondary | ICD-10-CM | POA: Diagnosis not present

## 2023-12-24 DIAGNOSIS — K7682 Hepatic encephalopathy: Secondary | ICD-10-CM

## 2023-12-24 DIAGNOSIS — I4891 Unspecified atrial fibrillation: Secondary | ICD-10-CM | POA: Diagnosis not present

## 2023-12-24 DIAGNOSIS — K2980 Duodenitis without bleeding: Secondary | ICD-10-CM | POA: Diagnosis not present

## 2023-12-24 DIAGNOSIS — F1010 Alcohol abuse, uncomplicated: Secondary | ICD-10-CM | POA: Diagnosis not present

## 2023-12-24 LAB — COMPREHENSIVE METABOLIC PANEL
ALT: 91 U/L — ABNORMAL HIGH (ref 0–44)
AST: 142 U/L — ABNORMAL HIGH (ref 15–41)
Albumin: 2.5 g/dL — ABNORMAL LOW (ref 3.5–5.0)
Alkaline Phosphatase: 168 U/L — ABNORMAL HIGH (ref 38–126)
Anion gap: 4 — ABNORMAL LOW (ref 5–15)
BUN: 20 mg/dL (ref 6–20)
CO2: 25 mmol/L (ref 22–32)
Calcium: 7.9 mg/dL — ABNORMAL LOW (ref 8.9–10.3)
Chloride: 105 mmol/L (ref 98–111)
Creatinine, Ser: 0.93 mg/dL (ref 0.61–1.24)
GFR, Estimated: >60 mL/min (ref >60)
Glucose, Bld: 119 mg/dL — ABNORMAL HIGH (ref 70–99)
Potassium: 3.5 mmol/L (ref 3.5–5.1)
Sodium: 134 mmol/L — ABNORMAL LOW (ref 135–145)
Total Bilirubin: 2.7 mg/dL — ABNORMAL HIGH (ref 0.0–1.2)
Total Protein: 6.3 g/dL — ABNORMAL LOW (ref 6.5–8.1)

## 2023-12-24 LAB — IRON AND TIBC
Iron: 38 ug/dL — ABNORMAL LOW (ref 45–182)
Saturation Ratios: 15 % — ABNORMAL LOW (ref 17.9–39.5)
TIBC: 256 ug/dL (ref 250–450)
UIBC: 218 ug/dL

## 2023-12-24 LAB — FERRITIN: Ferritin: 126 ng/mL (ref 24–336)

## 2023-12-24 LAB — FOLATE: Folate: 27 ng/mL (ref >5.9)

## 2023-12-24 LAB — VITAMIN B12: Vitamin B-12: 978 pg/mL — ABNORMAL HIGH (ref 180–914)

## 2023-12-24 MED ORDER — EPLERENONE 25 MG PO TABS
12.5000 mg | ORAL_TABLET | Freq: Every day | ORAL | Status: DC
Start: 2023-12-24 — End: 2023-12-26
  Administered 2023-12-24 – 2023-12-26 (×3): 12.5 mg via ORAL
  Filled 2023-12-24 (×4): qty 1

## 2023-12-24 MED ORDER — ALBUMIN HUMAN 25 % IV SOLN
12.5000 g | Freq: Four times a day (QID) | INTRAVENOUS | Status: AC
  Administered 2023-12-24 (×2): 12.5 g via INTRAVENOUS
  Filled 2023-12-24 (×2): qty 50

## 2023-12-24 MED ORDER — POTASSIUM CHLORIDE CRYS ER 20 MEQ PO TBCR
20.0000 meq | EXTENDED_RELEASE_TABLET | Freq: Once | ORAL | Status: AC
  Administered 2023-12-24: 20 meq via ORAL
  Filled 2023-12-24: qty 1

## 2023-12-24 MED ORDER — FUROSEMIDE 20 MG PO TABS
20.0000 mg | ORAL_TABLET | Freq: Every day | ORAL | Status: DC
  Administered 2023-12-25: 20 mg via ORAL
  Filled 2023-12-24: qty 1

## 2023-12-24 NOTE — Progress Notes (Signed)
 PHARMACY CONSULT NOTE - ELECTROLYTES  Pharmacy Consult for Electrolyte Monitoring and Replacement   Recent Labs: Height: 5\' 11"  (180.3 cm) Weight: 100.4 kg (221 lb 5.5 oz) IBW/kg (Calculated) : 75.3 Estimated Creatinine Clearance: 114.7 mL/min (by C-G formula based on SCr of 0.93 mg/dL).  Potassium (mmol/L)  Date Value  12/24/2023 3.5  11/02/2013 3.7   Magnesium (mg/dL)  Date Value  78/29/5621 2.1   Calcium (mg/dL)  Date Value  30/86/5784 7.9 (L)   Calcium, Total (mg/dL)  Date Value  69/62/9528 8.9   Albumin (g/dL)  Date Value  41/32/4401 2.5 (L)  06/12/2016 4.7  11/02/2013 4.2   Phosphorus (mg/dL)  Date Value  02/72/5366 3.8   Sodium (mmol/L)  Date Value  12/24/2023 134 (L)  06/12/2016 140  11/02/2013 138    Assessment  Omar Turner is a 53 y.o. male presenting with Abd pain,new onset Afib, ascites . PMH significant for alcohol abuse, alcoholic liver cirrhosis with ascites, esophageal varices, GI bleeding, HTN, HLD, dCHF, anemia, GERD, duodenitis, skin cancer, thrombocytopenia. Patient reports 3-4 times of diarrhea each day and drink about a pint of liquor daily. Pharmacy has been consulted to monitor and replace electrolytes.  Diet: regular diet with fluid restriction MIVF: none Pertinent medications:  sodium chloride 1 g BID -Holding PTA Lasix and spironolactone due to hyponatremia and hypotension  Goal of Therapy: Electrolytes WNL Goal K>4.0 and Mag >2.0   for Afib  Plan:  Na slightly below normal range but stable.K dropped from 4 to 3.5, will replace with Kcl po po x 1 dose. No daily dose due to consistent spironolactone order. Follow AM labs  Alma Muegge Rodriguez-Guzman PharmD, BCPS 12/24/2023 8:01 AM

## 2023-12-24 NOTE — Progress Notes (Signed)
 PT Cancellation Note  Patient Details Name: Omar Turner MRN: [id number] DOB: 1970/11/25   Cancelled Treatment:    Reason Eval/Treat Not Completed: Fatigue/lethargy limiting ability to participate (Consult received and chart reviewed.  Evaluation attempted.  Patient sleeping soundly upon arrival to room; visitor at bedside requests therapist allow patient to rest and re-attempt at later time as able.)   Eugen Jeansonne H. Manson Passey, PT, DPT, NCS 12/24/23, 2:55 PM [phone]

## 2023-12-24 NOTE — Plan of Care (Signed)

## 2023-12-24 NOTE — Plan of Care (Signed)
  Problem: Activity: Goal: Risk for activity intolerance will decrease Outcome: Progressing   Problem: Coping: Goal: Level of anxiety will decrease Outcome: Progressing   Problem: Pain Managment: Goal: General experience of comfort will improve and/or be controlled Outcome: Progressing   Problem: Safety: Goal: Ability to remain free from injury will improve Outcome: Progressing

## 2023-12-24 NOTE — Progress Notes (Signed)
 Progress Note   Patient: Omar Turner ZOX:[id number] DOB: 13-May-1971 DOA: 12/17/2023     7 DOS: the patient was seen and examined on 12/24/2023   Brief hospital course: OSSIE BELTRAN is a 53 y.o. male with medical history significant of alcohol abuse, alcoholic liver cirrhosis with ascites, esophageal varices, GI bleeding, HTN, HLD, dCHF, anemia, GERD, duodenitis, skin cancer, who presents with abdominal pain, abdominal distention and heart racing. Patient is admitted for new onset Afib with RVR, Alcohol abuse, ascitis, hyponatremia, hypokalemia.    Hospital course:   3/13-successful paracentesis, 4 L transudative fluid withdrawn 3/15 -noted worsening fatigue/dizziness -electrolytes repleted 3/16-worsening lethargy, poorly arousable, elevated ammonia, initiate lactulose 3/17-improving mental status 3/18-mental status is to be at baseline, paracentesis with 6L fluid removed, restarted IV diuretics. Finished rocephin 7day sbp ppx. 3/19 c/o profuse diarrhea, Lactulose stopped.  Assessment and Plan: Acute hepatic encephalopathy (HCC) Mental status back to baseline. Stopped lactulose given multiple episodes of diarrhea. Advised to work with PT for discharge recommendations. Ascites fluid non infective. Finished Rocephin for SBP ppx total 7 days.   Alcohol abuse. Alcoholic cirrhosis of liver with ascites (HCC) Abdominal pain; duodenitis, chronic MELD score 26 conferring a mortality risk of 20% in 3 months, understands to follow-up with outpatient GI, hepatology and transplant follow-up upon discharge. LFTs stable.  Bili 2.7. Status post paracentesis on 3/13 successful with 4 L of transudative fluid removed, paracentesis 3/18 6 L removed. Stopped IV lasix. 2 dose albumin infusion ordered. Continue oral Lasix, eplerenone to be resumed.   Atrial fibrillation with RVR (HCC) Nadolol dosing decreased to 10 mg daily given symptoms of fatigue and dizziness   Chronic diastolic CHF (congestive  heart failure) (HCC) Complicated by patient's ascites and liver failure, will give 20mg  Lasix, start eplerenone from tomorrow morning.   Myocardial injury Troponins elevated at 20-21, but flat. Monitor.   Hypertension Continue on lower dose of nadolol. Midodrine if patient becomes hypotensive.   Tobacco abuse Smoking cessation advised.   Thrombocytopenia (HCC) Platelets improving, 61 this morning.  No signs of acute bleeding.   Electrolyte abnormality Sodium stable. K. Mag improved. Monitor daily electrolytes and replete accordingly.  Chronic anemia- Stable, no active bleeding. Iron profile, b12, folate levels ordered.     Out of bed to chair. Incentive spirometry. Nursing supportive care. Fall, aspiration precautions. Diet:  Diet Orders (From admission, onward)     Start     Ordered   12/17/23 2352  Diet regular Fluid consistency: Thin; Fluid restriction: 1200 mL Fluid  Diet effective now       Question Answer Comment  Fluid consistency: Thin   Fluid restriction: 1200 mL Fluid      12/17/23 2351           DVT prophylaxis: SCDs Start: 12/17/23 2357  Level of care: Progressive   Code Status: Full Code  Subjective: Patient is seen and examined today morning.  He is complaining of multiple episodes of watery diarrhea.  Has abdominal discomfort.  Mother at bedside asks about discharge.  Physical Exam: Vitals:   12/24/23 0348 12/24/23 0500 12/24/23 0747 12/24/23 1145  BP: 114/83  106/80 119/79  Pulse: 86  81 80  Resp: 18     Temp: 98.1 F (36.7 C)  98.1 F (36.7 C) 97.8 F (36.6 C)  TempSrc: Oral  Oral   SpO2: 97%  96% 98%  Weight:  100.4 kg    Height:        General - Elderly Caucasian male,  distress secondary to diarrhea HEENT - PERRLA, EOMI, atraumatic head, non tender sinuses. Lung - Clear, basal rales, rhonchi, wheezes. Heart - S1, S2 heard, no murmurs, rubs, trace pedal edema. Abdomen - Soft, non tender, distended, bowel sounds good Neuro -  Alert, awake and oriented x 3, non focal exam. Skin - Warm and dry.  Data Reviewed:      Latest Ref Rng & Units 12/23/2023    5:00 AM 12/22/2023    7:00 AM 12/21/2023    4:36 AM  CBC  WBC 4.0 - 10.5 K/uL 6.5  7.1  6.6   Hemoglobin 13.0 - 17.0 g/dL 16.1  09.6  04.5   Hematocrit 39.0 - 52.0 % 30.7  33.9  30.4   Platelets 150 - 400 K/uL 61  61  50       Latest Ref Rng & Units 12/24/2023    5:09 AM 12/23/2023    5:00 AM 12/22/2023    7:00 AM  BMP  Glucose 70 - 99 mg/dL 409  811  914   BUN 6 - 20 mg/dL 20  16  15    Creatinine 0.61 - 1.24 mg/dL 7.82  9.56  2.13   Sodium 135 - 145 mmol/L 134  133  135   Potassium 3.5 - 5.1 mmol/L 3.5  4.0  4.3   Chloride 98 - 111 mmol/L 105  106  102   CO2 22 - 32 mmol/L 25  23  25    Calcium 8.9 - 10.3 mg/dL 7.9  8.0  8.3    US Paracentesis Result Date: 12/23/2023 INDICATION: Cirrhosis and recurrent ascites. Request is for therapeutic paracentesis EXAM: ULTRASOUND GUIDED THERAPEUTIC PARACENTESIS MEDICATIONS: Lidocaine 1% 10 mL COMPLICATIONS: None immediate. PROCEDURE: Informed written consent was obtained from the patient after a discussion of the risks, benefits and alternatives to treatment. A timeout was performed prior to the initiation of the procedure. Initial ultrasound scanning demonstrates a large amount of ascites within the right lower abdominal quadrant. The right lower abdomen was prepped and draped in the usual sterile fashion. 1% lidocaine was used for local anesthesia. Following this, a 19 gauge, 7-cm, Yueh catheter was introduced. An ultrasound image was saved for documentation purposes. The paracentesis was performed. The catheter was removed and a dressing was applied. The patient tolerated the procedure well without immediate post procedural complication. Patient received post-procedure intravenous albumin; see nursing notes for details. FINDINGS: A total of approximately 6 L of straw-colored fluid was removed. IMPRESSION: Successful  ultrasound-guided therapeutic paracentesis yielding 6 liters of peritoneal fluid. Performed by Anders Grant NP PLAN: The patient has required >/=2 paracenteses in a 30 day period and a formal evaluation by the [facility] Interventional Radiology Portal Hypertension Clinic has been arranged. Electronically Signed   By: Irish Lack M.D.   On: 12/23/2023 16:07    Family Communication: Discussed with patient, mother. They understand and agree. All questions answered.  Disposition: Status is: Inpatient Remains inpatient appropriate because: Profuse diarrhea, monitor electrolytes  Planned Discharge Destination: Home     Time spent: 40 minutes  Author: Marcelino Duster, MD 12/24/2023 2:08 PM Secure chat 7am to 7pm For on call review [email or web address].

## 2023-12-25 DIAGNOSIS — K2980 Duodenitis without bleeding: Secondary | ICD-10-CM | POA: Diagnosis not present

## 2023-12-25 DIAGNOSIS — I4891 Unspecified atrial fibrillation: Secondary | ICD-10-CM | POA: Diagnosis not present

## 2023-12-25 DIAGNOSIS — K7031 Alcoholic cirrhosis of liver with ascites: Secondary | ICD-10-CM | POA: Diagnosis not present

## 2023-12-25 DIAGNOSIS — F1010 Alcohol abuse, uncomplicated: Secondary | ICD-10-CM | POA: Diagnosis not present

## 2023-12-25 LAB — BASIC METABOLIC PANEL
Anion gap: 4 — ABNORMAL LOW (ref 5–15)
BUN: 22 mg/dL — ABNORMAL HIGH (ref 6–20)
CO2: 26 mmol/L (ref 22–32)
Calcium: 7.8 mg/dL — ABNORMAL LOW (ref 8.9–10.3)
Chloride: 102 mmol/L (ref 98–111)
Creatinine, Ser: 0.77 mg/dL (ref 0.61–1.24)
GFR, Estimated: >60 mL/min (ref >60)
Glucose, Bld: 102 mg/dL — ABNORMAL HIGH (ref 70–99)
Potassium: 3.2 mmol/L — ABNORMAL LOW (ref 3.5–5.1)
Sodium: 132 mmol/L — ABNORMAL LOW (ref 135–145)

## 2023-12-25 LAB — CBC
HCT: 30.9 % — ABNORMAL LOW (ref 39.0–52.0)
Hemoglobin: 10.9 g/dL — ABNORMAL LOW (ref 13.0–17.0)
MCH: 34.5 pg — ABNORMAL HIGH (ref 26.0–34.0)
MCHC: 35.3 g/dL (ref 30.0–36.0)
MCV: 97.8 fL (ref 80.0–100.0)
Platelets: 84 10*3/uL — ABNORMAL LOW (ref 150–400)
RBC: 3.16 MIL/uL — ABNORMAL LOW (ref 4.22–5.81)
RDW: 15.3 % (ref 11.5–15.5)
WBC: 8 10*3/uL (ref 4.0–10.5)
nRBC: 0 % (ref 0.0–0.2)

## 2023-12-25 LAB — GLUCOSE, CAPILLARY: Glucose-Capillary: 107 mg/dL — ABNORMAL HIGH (ref 70–99)

## 2023-12-25 MED ORDER — POTASSIUM CHLORIDE 20 MEQ PO PACK
40.0000 meq | PACK | Freq: Two times a day (BID) | ORAL | Status: DC
  Administered 2023-12-25: 40 meq via ORAL
  Filled 2023-12-25: qty 2

## 2023-12-25 NOTE — Progress Notes (Signed)
 PHARMACY CONSULT NOTE - ELECTROLYTES  Pharmacy Consult for Electrolyte Monitoring and Replacement   Recent Labs: Height: 5\' 11"  (180.3 cm) Weight: 102.7 kg (226 lb 6.6 oz) IBW/kg (Calculated) : 75.3 Estimated Creatinine Clearance: 116 mL/min (by C-G formula based on SCr of 0.93 mg/dL).  Potassium (mmol/L)  Date Value  12/24/2023 3.5  11/02/2013 3.7   Magnesium (mg/dL)  Date Value  13/24/4010 2.1   Calcium (mg/dL)  Date Value  27/25/3664 7.9 (L)   Calcium, Total (mg/dL)  Date Value  40/34/7425 8.9   Albumin (g/dL)  Date Value  95/63/8756 2.5 (L)  06/12/2016 4.7  11/02/2013 4.2   Phosphorus (mg/dL)  Date Value  43/32/9518 3.8   Sodium (mmol/L)  Date Value  12/24/2023 134 (L)  06/12/2016 140  11/02/2013 138    Assessment  Omar Turner is a 53 y.o. male presenting with Abd pain,new onset Afib, ascites . PMH significant for alcohol abuse, alcoholic liver cirrhosis with ascites, esophageal varices, GI bleeding, HTN, HLD, dCHF, anemia, GERD, duodenitis, skin cancer, thrombocytopenia. Patient reports 3-4 times of diarrhea each day and drink about a pint of liquor daily. Pharmacy has been consulted to monitor and replace electrolytes.  Diet: regular diet with fluid restriction MIVF: none Pertinent medications:  sodium chloride 1 g BID -Holding PTA Lasix and spironolactone due to hyponatremia and hypotension  Goal of Therapy: Electrolytes WNL Goal K>4.0 and Mag >2.0   for Afib  Plan:  No replacement indicated today.  Follow AM labs  Omar Turner PharmD, BCPS 12/25/2023 7:35 AM

## 2023-12-25 NOTE — Plan of Care (Signed)

## 2023-12-25 NOTE — Evaluation (Signed)
 Physical Therapy Evaluation Patient Details Name: Omar Turner MRN: [id number] DOB: March 25, 1971 Today's Date: 12/25/2023  History of Present Illness  Omar Turner is a 53 y.o. male with medical history significant of alcohol abuse, alcoholic liver cirrhosis with ascites, esophageal varices, GI bleeding, HTN, HLD, dCHF, anemia, GERD, duodenitis, skin cancer, who presents with abdominal pain, abdominal distention and heart racing. Patient is admitted for new onset Afib with RVR, Alcohol abuse, ascitis, hyponatremia, hypokalemia.  Clinical Impression  Pt is a pleasant 53 year old male who was admitted for Afib with RVR. Pt performs bed mobility/transfers with independence and ambulation with supervision and no AD. Pt demonstrates deficits with dizziness/mobility. Orthostatic this session and rates dizziness at 7/10. Unable to further ambulate in hallway due to symptoms. Advised patient that RN staff should attempt another round of orthostatics if symptoms persist. Anticipate as dizziness improves, functional mobility will return to baseline. Would benefit from skilled PT to address above deficits and promote optimal return to PLOF. Pt will continue to receive skilled PT services while admitted and will defer to TOC/care team for updates regarding disposition planning.         If plan is discharge home, recommend the following: A little help with walking and/or transfers;Help with stairs or ramp for entrance   Can travel by private vehicle        Equipment Recommendations None recommended by PT  Recommendations for Other Services       Functional Status Assessment Patient has had a recent decline in their functional status and demonstrates the ability to make significant improvements in function in a reasonable and predictable amount of time.     Precautions / Restrictions Precautions Precautions: Fall Recall of Precautions/Restrictions: Intact Restrictions Weight Bearing Restrictions  Per Provider Order: No      Mobility  Bed Mobility Overal bed mobility: Independent             General bed mobility comments: safe technique with upright posture noted    Transfers Overall transfer level: Independent Equipment used: None               General transfer comment: once standing, upright posture and dizziness noted    Ambulation/Gait Ambulation/Gait assistance: Supervision Gait Distance (Feet): 20 Feet Assistive device: None Gait Pattern/deviations: Step-through pattern       General Gait Details: ambulated short distance in room, secondary to dizziness. Pt rates dizziness with ambulation. Orthostatics obtained  Stairs            Wheelchair Mobility     Tilt Bed    Modified Rankin (Stroke Patients Only)       Balance Overall balance assessment: Independent                                           Pertinent Vitals/Pain Pain Assessment Pain Assessment: No/denies pain    Home Living Family/patient expects to be discharged to:: Private residence Living Arrangements: Parent Available Help at Discharge: Family Type of Home: House           Home Equipment: None      Prior Function Prior Level of Function : Independent/Modified Independent;Driving             Mobility Comments: indep. since being admitted to hospital- is reporting dizziness with ambulation. Feels very unsteady and like he will pass out ADLs Comments: indep  Extremity/Trunk Assessment   Upper Extremity Assessment Upper Extremity Assessment: Overall WFL for tasks assessed    Lower Extremity Assessment Lower Extremity Assessment: Overall WFL for tasks assessed       Communication   Communication Communication: No apparent difficulties    Cognition Arousal: Alert Behavior During Therapy: WFL for tasks assessed/performed   PT - Cognitive impairments: No apparent impairments                       PT - Cognition  Comments: eating Chick fil A at bedside upon arrival Following commands: Intact       Cueing Cueing Techniques: Verbal cues     General Comments      Exercises     Assessment/Plan    PT Assessment Patient needs continued PT services  PT Problem List Decreased balance;Decreased mobility       PT Treatment Interventions DME instruction;Balance training;Gait training    PT Goals (Current goals can be found in the Care Plan section)  Acute Rehab PT Goals Patient Stated Goal: to go home PT Goal Formulation: With patient Time For Goal Achievement: 01/08/24 Potential to Achieve Goals: Good    Frequency Min 1X/week     Co-evaluation               AM-PAC PT "6 Clicks" Mobility  Outcome Measure Help needed turning from your back to your side while in a flat bed without using bedrails?: None Help needed moving from lying on your back to sitting on the side of a flat bed without using bedrails?: None Help needed moving to and from a bed to a chair (including a wheelchair)?: None Help needed standing up from a chair using your arms (e.g., wheelchair or bedside chair)?: None Help needed to walk in hospital room?: A Little Help needed climbing 3-5 steps with a railing? : A Little 6 Click Score: 22    End of Session   Activity Tolerance: Patient tolerated treatment well Patient left: in bed;with family/visitor present Nurse Communication: Mobility status PT Visit Diagnosis: Unsteadiness on feet (R26.81);Difficulty in walking, not elsewhere classified (R26.2);Dizziness and giddiness (R42)    Time: 1322-1340 PT Time Calculation (min) (ACUTE ONLY): 18 min   Charges:   PT Evaluation $PT Eval Low Complexity: 1 Low   PT General Charges $$ ACUTE PT VISIT: 1 Visit         Elizabeth Palau, PT, DPT, GCS [phone]   [name] 12/25/2023, 3:22 PM

## 2023-12-25 NOTE — Progress Notes (Signed)
 Progress Note   Patient: Omar Turner PPI:[id number] DOB: 08/13/1971 DOA: 12/17/2023     8 DOS: the patient was seen and examined on 12/25/2023   Brief hospital course: DALLIN MCCORKEL is a 53 y.o. male with medical history significant of alcohol abuse, alcoholic liver cirrhosis with ascites, esophageal varices, GI bleeding, HTN, HLD, dCHF, anemia, GERD, duodenitis, skin cancer, who presents with abdominal pain, abdominal distention and heart racing. Patient is admitted for new onset Afib with RVR, Alcohol abuse, ascitis, hyponatremia, hypokalemia.    Hospital course:   3/13-successful paracentesis, 4 L transudative fluid withdrawn 3/15 -noted worsening fatigue/dizziness -electrolytes repleted 3/16-worsening lethargy, poorly arousable, elevated ammonia, initiate lactulose 3/17-improving mental status 3/18-mental status is to be at baseline, paracentesis with 6L fluid removed, restarted IV diuretics. Finished rocephin 7day sbp ppx. 3/19 c/o profuse diarrhea, Lactulose stopped. 3/20 worked with PT felt dizzy, orthostatic positive.  Assessment and Plan: Acute hepatic encephalopathy (HCC) Mental status back to baseline. Stopped lactulose given multiple episodes of diarrhea. Advised to work with PT for discharge recommendations. Ascites fluid non infective. Finished Rocephin for SBP ppx total 7 days.   Alcohol abuse. Alcoholic cirrhosis of liver with ascites (HCC) Abdominal pain; duodenitis, chronic MELD score 26 conferring a mortality risk of 20% in 3 months, understands to follow-up with outpatient GI, hepatology and transplant follow-up upon discharge. LFTs stable.  Bili 2.7. Status post paracentesis on 3/13 successful with 4 L of transudative fluid removed, paracentesis 3/18 6 L removed. Stopped IV lasix. 2 dose albumin infusion ordered. Consider stopping oral Lasix given orthostasis positive.   Atrial fibrillation with RVR (HCC) Nadolol dosing decreased to 10 mg daily given  symptoms of fatigue and dizziness   Chronic diastolic CHF (congestive heart failure) (HCC) Complicated by patient's ascites and liver failure, will give 20mg  Lasix, start eplerenone from tomorrow morning.   Myocardial injury Troponins elevated at 20-21, but flat. Monitor.   Hypertension Continue on lower dose of nadolol. Midodrine if patient becomes hypotensive.   Tobacco abuse Smoking cessation advised.   Thrombocytopenia (HCC) Platelets improving, 61 this morning.  No signs of acute bleeding.   Electrolyte abnormality Sodium stable. K. Mag improved. Monitor daily electrolytes and replete accordingly.  Chronic anemia- Stable, no active bleeding. Iron profile, b12, folate levels ordered.    Out of bed to chair. Incentive spirometry. Nursing supportive care. Fall, aspiration precautions. Diet:  Diet Orders (From admission, onward)     Start     Ordered   12/17/23 2352  Diet regular Fluid consistency: Thin; Fluid restriction: 1200 mL Fluid  Diet effective now       Question Answer Comment  Fluid consistency: Thin   Fluid restriction: 1200 mL Fluid      12/17/23 2351           DVT prophylaxis: SCDs Start: 12/17/23 2357  Level of care: Progressive   Code Status: Full Code  Subjective: Patient is seen and examined today morning.  His diarrhea better. Has no abdominal discomfort.  Mother at bedside. Worked with PT felt dizzy.  Physical Exam: Vitals:   12/25/23 0345 12/25/23 0500 12/25/23 0851 12/25/23 1249  BP: 103/65  116/82 96/65  Pulse: 83  87 81  Resp: 20     Temp: 98 F (36.7 C)  (!) 96.9 F (36.1 C) 98.8 F (37.1 C)  TempSrc: Oral     SpO2: 97%  99% 97%  Weight:  102.7 kg    Height:        General -  Elderly Caucasian male, no apparent distress HEENT - PERRLA, EOMI, atraumatic head, non tender sinuses. Lung - Clear, basal rales, rhonchi, wheezes. Heart - S1, S2 heard, no murmurs, rubs, trace pedal edema. Abdomen - Soft, non tender, distended,  bowel sounds good Neuro - Alert, awake and oriented x 3, non focal exam. Skin - Warm and dry.  Data Reviewed:      Latest Ref Rng & Units 12/25/2023    5:43 AM 12/23/2023    5:00 AM 12/22/2023    7:00 AM  CBC  WBC 4.0 - 10.5 K/uL 8.0  6.5  7.1   Hemoglobin 13.0 - 17.0 g/dL 16.1  09.6  04.5   Hematocrit 39.0 - 52.0 % 30.9  30.7  33.9   Platelets 150 - 400 K/uL 84  61  61       Latest Ref Rng & Units 12/25/2023    5:43 AM 12/24/2023    5:09 AM 12/23/2023    5:00 AM  BMP  Glucose 70 - 99 mg/dL 409  811  914   BUN 6 - 20 mg/dL 22  20  16    Creatinine 0.61 - 1.24 mg/dL 7.82  9.56  2.13   Sodium 135 - 145 mmol/L 132  134  133   Potassium 3.5 - 5.1 mmol/L 3.2  3.5  4.0   Chloride 98 - 111 mmol/L 102  105  106   CO2 22 - 32 mmol/L 26  25  23    Calcium 8.9 - 10.3 mg/dL 7.8  7.9  8.0    US Paracentesis Result Date: 12/23/2023 INDICATION: Cirrhosis and recurrent ascites. Request is for therapeutic paracentesis EXAM: ULTRASOUND GUIDED THERAPEUTIC PARACENTESIS MEDICATIONS: Lidocaine 1% 10 mL COMPLICATIONS: None immediate. PROCEDURE: Informed written consent was obtained from the patient after a discussion of the risks, benefits and alternatives to treatment. A timeout was performed prior to the initiation of the procedure. Initial ultrasound scanning demonstrates a large amount of ascites within the right lower abdominal quadrant. The right lower abdomen was prepped and draped in the usual sterile fashion. 1% lidocaine was used for local anesthesia. Following this, a 19 gauge, 7-cm, Yueh catheter was introduced. An ultrasound image was saved for documentation purposes. The paracentesis was performed. The catheter was removed and a dressing was applied. The patient tolerated the procedure well without immediate post procedural complication. Patient received post-procedure intravenous albumin; see nursing notes for details. FINDINGS: A total of approximately 6 L of straw-colored fluid was removed.  IMPRESSION: Successful ultrasound-guided therapeutic paracentesis yielding 6 liters of peritoneal fluid. Performed by Anders Grant NP PLAN: The patient has required >/=2 paracenteses in a 30 day period and a formal evaluation by the [facility] has been arranged. Electronically Signed   By: Irish Lack M.D.   On: 12/23/2023 16:07    Family Communication: Discussed with patient, mother. They understand and agree. All questions answered.  Disposition: Status is: Inpatient Remains inpatient appropriate because: Profuse diarrhea, monitor electrolytes  Planned Discharge Destination: Home     Time spent: 38 minutes  Author: Marcelino Duster, MD 12/25/2023 3:01 PM Secure chat 7am to 7pm For on call review [email or web address].

## 2023-12-26 DIAGNOSIS — K7031 Alcoholic cirrhosis of liver with ascites: Secondary | ICD-10-CM | POA: Diagnosis not present

## 2023-12-26 DIAGNOSIS — I4891 Unspecified atrial fibrillation: Secondary | ICD-10-CM | POA: Diagnosis not present

## 2023-12-26 DIAGNOSIS — F1010 Alcohol abuse, uncomplicated: Secondary | ICD-10-CM | POA: Diagnosis not present

## 2023-12-26 DIAGNOSIS — I5032 Chronic diastolic (congestive) heart failure: Secondary | ICD-10-CM | POA: Diagnosis not present

## 2023-12-26 LAB — COMPREHENSIVE METABOLIC PANEL
ALT: 81 U/L — ABNORMAL HIGH (ref 0–44)
AST: 108 U/L — ABNORMAL HIGH (ref 15–41)
Albumin: 2.5 g/dL — ABNORMAL LOW (ref 3.5–5.0)
Alkaline Phosphatase: 139 U/L — ABNORMAL HIGH (ref 38–126)
Anion gap: 7 (ref 5–15)
BUN: 22 mg/dL — ABNORMAL HIGH (ref 6–20)
CO2: 26 mmol/L (ref 22–32)
Calcium: 8.2 mg/dL — ABNORMAL LOW (ref 8.9–10.3)
Chloride: 102 mmol/L (ref 98–111)
Creatinine, Ser: 0.85 mg/dL (ref 0.61–1.24)
GFR, Estimated: >60 mL/min (ref >60)
Glucose, Bld: 101 mg/dL — ABNORMAL HIGH (ref 70–99)
Potassium: 3.4 mmol/L — ABNORMAL LOW (ref 3.5–5.1)
Sodium: 135 mmol/L (ref 135–145)
Total Bilirubin: 2.6 mg/dL — ABNORMAL HIGH (ref 0.0–1.2)
Total Protein: 5.6 g/dL — ABNORMAL LOW (ref 6.5–8.1)

## 2023-12-26 LAB — CBC
HCT: 32.4 % — ABNORMAL LOW (ref 39.0–52.0)
Hemoglobin: 11.4 g/dL — ABNORMAL LOW (ref 13.0–17.0)
MCH: 34.4 pg — ABNORMAL HIGH (ref 26.0–34.0)
MCHC: 35.2 g/dL (ref 30.0–36.0)
MCV: 97.9 fL (ref 80.0–100.0)
Platelets: 89 10*3/uL — ABNORMAL LOW (ref 150–400)
RBC: 3.31 MIL/uL — ABNORMAL LOW (ref 4.22–5.81)
RDW: 15.3 % (ref 11.5–15.5)
WBC: 8.3 10*3/uL (ref 4.0–10.5)
nRBC: 0 % (ref 0.0–0.2)

## 2023-12-26 MED ORDER — NICOTINE 21 MG/24HR TD PT24: 21.0000 mg | MEDICATED_PATCH | Freq: Every day | TRANSDERMAL | 0 refills | Status: DC

## 2023-12-26 MED ORDER — FOLIC ACID 1 MG PO TABS: 1.0000 mg | ORAL_TABLET | Freq: Every day | ORAL | 1 refills | Status: DC

## 2023-12-26 MED ORDER — FUROSEMIDE 20 MG PO TABS: 20.0000 mg | ORAL_TABLET | Freq: Every day | ORAL | 2 refills | Status: DC

## 2023-12-26 MED ORDER — NADOLOL 20 MG PO TABS: 10.0000 mg | ORAL_TABLET | Freq: Every day | ORAL | 1 refills | Status: DC

## 2023-12-26 MED ORDER — ADULT MULTIVITAMIN W/MINERALS CH: 1.0000 | ORAL_TABLET | Freq: Every day | ORAL | 1 refills | Status: DC

## 2023-12-26 MED ORDER — VITAMIN B-1 100 MG PO TABS: 100.0000 mg | ORAL_TABLET | Freq: Every day | ORAL | 1 refills | Status: DC

## 2023-12-26 MED ORDER — ONDANSETRON 4 MG PO TBDP: 4.0000 mg | ORAL_TABLET | Freq: Three times a day (TID) | ORAL | 0 refills | Status: DC | PRN

## 2023-12-26 MED ORDER — POTASSIUM CHLORIDE CRYS ER 20 MEQ PO TBCR
40.0000 meq | EXTENDED_RELEASE_TABLET | Freq: Two times a day (BID) | ORAL | Status: DC
  Administered 2023-12-26: 40 meq via ORAL
  Filled 2023-12-26: qty 2

## 2023-12-26 NOTE — Plan of Care (Signed)

## 2023-12-26 NOTE — Discharge Summary (Signed)
 Physician Discharge Summary   Patient: Omar Turner MRN: [id number] DOB: 01-28-1971  Admit date:     12/17/2023  Discharge date: 12/26/23  Discharge Physician: Marcelino Duster   PCP: Lynnea Ferrier, MD   Recommendations at discharge:   PCP follow-up in 1 week. GI follow-up as scheduled. Advised liver clinic follow-up.  Discharge Diagnoses: Principal Problem:   Atrial fibrillation with RVR (HCC) Active Problems:   Abdominal pain   Alcoholic cirrhosis of liver with ascites (HCC)   Alcohol abuse   Chronic diastolic CHF (congestive heart failure) (HCC)   Duodenitis   Myocardial injury   Hypertension   Tobacco abuse   Thrombocytopenia (HCC)   Diarrhea   Hypokalemia   Electrolyte abnormality   Hyponatremia   Hypomagnesemia   Acute hepatic encephalopathy (HCC)  Resolved Problems:   * No resolved hospital problems. *  Hospital Course: Omar Turner is a 53 y.o. male with medical history significant of alcohol abuse, alcoholic liver cirrhosis with ascites, esophageal varices, GI bleeding, HTN, HLD, dCHF, anemia, GERD, duodenitis, skin cancer, who presents with abdominal pain, abdominal distention and heart racing. Patient is admitted for new onset Afib with RVR, Alcohol abuse, ascitis, hyponatremia, hypokalemia.    Hospital course:   3/13-successful paracentesis, 4 L transudative fluid withdrawn 3/15 -noted worsening fatigue/dizziness -electrolytes repleted 3/16-worsening lethargy, poorly arousable, elevated ammonia, initiate lactulose 3/17-improving mental status 3/18-mental status is to be at baseline, paracentesis with 6L fluid removed, restarted IV diuretics. Finished rocephin 7day sbp ppx. 3/19 c/o profuse diarrhea, Lactulose stopped. 3/20 worked with PT felt dizzy, orthostatic positive. 3/21 stable to be discharged.  Discussed with patient and mother at bedside regarding discharge plan.  They understand and agree.  Assessment and Plan: Acute hepatic  encephalopathy (HCC) Mental status back to baseline. Stopped lactulose given multiple episodes of diarrhea. Advised to work with PT for discharge recommendations. Ascites fluid non infective. Finished Rocephin for SBP ppx total 7 days. Advised to follow-up with PCP and GI upon discharge.   Alcohol abuse. Alcoholic cirrhosis of liver with ascites (HCC) Abdominal pain; duodenitis, chronic MELD score 26 conferring a mortality risk of 20% in 3 months, understands to follow-up with outpatient GI, hepatology and transplant follow-up upon discharge. LFTs stable.  Bili 2.7. Status post paracentesis on 3/13 successful with 4 L of transudative fluid removed, paracentesis 3/18 6 L removed. Stopped IV lasix. 2 dose albumin infusion ordered. Low-dose oral Lasix, eplerenone, nadolol prescription sent to pharmacy  Atrial fibrillation with RVR [facility]) Cardiology evaluated him.  Given high risk of bleeding with his liver disorder did not recommend anticoagulation. Nadolol decreased to 10 mg daily given symptoms of fatigue and dizziness   Chronic diastolic CHF (congestive heart failure) (HCC) Complicated by patient's ascites and liver failure, resumed 20mg  Lasix, and eplerenone prior to discharge   Myocardial injury Troponins elevated at 20-21, but flat.  Low suspicion for ACS   Hypertension Continue on lower dose of nadolol.   Tobacco abuse Smoking cessation advised. Nicotine patches prescribed   Thrombocytopenia (HCC) Platelets stable, improving.  No signs of acute bleeding.   Electrolyte abnormality Continue potassium supplements, prescription given Monitor daily electrolytes and replete accordingly.   Chronic anemia- Stable, no active bleeding. Continue iron, thiamine, folate and multivitamin supplements.       Consultants: Cardiology Procedures performed: None Disposition: Home Diet recommendation:  Discharge Diet Orders (From admission, onward)     Start     Ordered    12/26/23 0000  Diet - low  sodium heart healthy        12/26/23 1331           Cardiac diet DISCHARGE MEDICATION: Allergies as of 12/26/2023       Reactions   Chantix [varenicline]    Lexapro [escitalopram]            Medication List     STOP taking these medications    azithromycin 250 MG tablet Commonly known as: ZITHROMAX   spironolactone 100 MG tablet Commonly known as: ALDACTONE       TAKE these medications    albuterol 108 (90 Base) MCG/ACT inhaler Commonly known as: VENTOLIN HFA Inhale 1-2 puffs into the lungs every 6 (six) hours as needed.   cetirizine 10 MG tablet Commonly known as: ZYRTEC Take 10 mg by mouth daily.   eplerenone 25 MG tablet Commonly known as: INSPRA Take 25 mg by mouth daily.   ferrous sulfate 324 MG Tbec Take 1 tablet (324 mg total) by mouth every other day.   folic acid 1 MG tablet Commonly known as: FOLVITE Take 1 tablet (1 mg total) by mouth daily. Start taking on: December 27, 2023   furosemide 20 MG tablet Commonly known as: LASIX Take 1 tablet (20 mg total) by mouth daily.   multivitamin with minerals Tabs tablet Take 1 tablet by mouth daily. Start taking on: December 27, 2023   nadolol 20 MG tablet Commonly known as: CORGARD Take 0.5 tablets (10 mg total) by mouth daily. What changed:  medication strength how much to take   naltrexone 50 MG tablet Commonly known as: DEPADE Take 1 tablet by mouth daily.   nicotine 21 mg/24hr patch Commonly known as: NICODERM CQ - dosed in mg/24 hours Place 1 patch (21 mg total) onto the skin daily. Start taking on: December 27, 2023   ondansetron 4 MG disintegrating tablet Commonly known as: ZOFRAN-ODT Take 1 tablet (4 mg total) by mouth every 8 (eight) hours as needed for nausea or vomiting.   pantoprazole 40 MG tablet Commonly known as: PROTONIX Take 1 tablet (40 mg total) by mouth daily.   potassium chloride 10 MEQ tablet Commonly known as: KLOR-CON Take 10 mEq by  mouth daily.   predniSONE 20 MG tablet Commonly known as: DELTASONE Take 20 mg by mouth 2 (two) times daily.   thiamine 100 MG tablet Commonly known as: Vitamin B-1 Take 1 tablet (100 mg total) by mouth daily. Start taking on: December 27, 2023        Discharge Exam: Ceasar Mons Weights   12/24/23 0500 12/25/23 0500 12/26/23 0500  Weight: 100.4 kg 102.7 kg 103 kg      12/26/2023   12:00 PM 12/26/2023    9:20 AM 12/26/2023    5:00 AM  Vitals with BMI  Weight   227 lbs 1 oz  BMI   31.68  Systolic 109 109   Diastolic 73 65   Pulse 84 98     General - Elderly Caucasian male, no apparent distress HEENT - PERRLA, EOMI, atraumatic head, non tender sinuses. Lung - Clear, basal rales, rhonchi, wheezes. Heart - S1, S2 heard, no murmurs, rubs, trace pedal edema. Abdomen - Soft, non tender, distended, bowel sounds good Neuro - Alert, awake and oriented x 3, non focal exam. Skin - Warm and dry.  Condition at discharge: stable  The results of significant diagnostics from this hospitalization (including imaging, microbiology, ancillary and laboratory) are listed below for reference.   Imaging Studies: US Paracentesis Result  Date: 12/23/2023 INDICATION: Cirrhosis and recurrent ascites. Request is for therapeutic paracentesis EXAM: ULTRASOUND GUIDED THERAPEUTIC PARACENTESIS MEDICATIONS: Lidocaine 1% 10 mL COMPLICATIONS: None immediate. PROCEDURE: Informed written consent was obtained from the patient after a discussion of the risks, benefits and alternatives to treatment. A timeout was performed prior to the initiation of the procedure. Initial ultrasound scanning demonstrates a large amount of ascites within the right lower abdominal quadrant. The right lower abdomen was prepped and draped in the usual sterile fashion. 1% lidocaine was used for local anesthesia. Following this, a 19 gauge, 7-cm, Yueh catheter was introduced. An ultrasound image was saved for documentation purposes. The paracentesis  was performed. The catheter was removed and a dressing was applied. The patient tolerated the procedure well without immediate post procedural complication. Patient received post-procedure intravenous albumin; see nursing notes for details. FINDINGS: A total of approximately 6 L of straw-colored fluid was removed. IMPRESSION: Successful ultrasound-guided therapeutic paracentesis yielding 6 liters of peritoneal fluid. Performed by Anders Grant NP PLAN: The patient has required >/=2 paracenteses in a 30 day period and a formal evaluation by the [facility] Interventional Radiology Portal Hypertension Clinic has been arranged. Electronically Signed   By: Irish Lack M.D.   On: 12/23/2023 16:07   ECHOCARDIOGRAM COMPLETE Result Date: 12/22/2023    ECHOCARDIOGRAM REPORT   Patient Name:   Omar Turner Date of Exam: 12/19/2023 Medical Rec #:  [id number]       Height:       71.0 in Accession #:    [id number]      Weight:       222.0 lb Date of Birth:  Sep 12, 1971       BSA:          2.204 m Patient Age:    52 years        BP:           103/72 mmHg Patient Gender: M               HR:           76 bpm. Exam Location:  [facility] Procedure: 2D Echo, Cardiac Doppler and Color Doppler (Both Spectral and Color            Flow Doppler were utilized during procedure). Indications:     Atrial Fibrillation I48.91  History:         Patient has prior history of Echocardiogram examinations, most                  recent 11/17/2021. Risk Factors:Hypertension and Dyslipidemia.  Sonographer:     Cristela Blue Referring Phys:  [id number] [facility] Diagnosing Phys: Lorine Bears MD IMPRESSIONS  1. Left ventricular ejection fraction, by estimation, is 55 to 60%. The left ventricle has normal function. The left ventricle has no regional wall motion abnormalities. Left ventricular diastolic parameters were normal.  2. Right ventricular systolic function is normal. The right ventricular size is normal. There is normal pulmonary artery  systolic pressure.  3. The mitral valve is normal in structure. Trivial mitral valve regurgitation. No evidence of mitral stenosis.  4. The aortic valve is normal in structure. Aortic valve regurgitation is not visualized. No aortic stenosis is present. FINDINGS  Left Ventricle: Left ventricular ejection fraction, by estimation, is 55 to 60%. The left ventricle has normal function. The left ventricle has no regional wall motion abnormalities. The left ventricular internal cavity size was normal in size. There is  no left ventricular hypertrophy.  Left ventricular diastolic parameters were normal. Right Ventricle: The right ventricular size is normal. No increase in right ventricular wall thickness. Right ventricular systolic function is normal. There is normal pulmonary artery systolic pressure. The tricuspid regurgitant velocity is 2.15 m/s, and  with an assumed right atrial pressure of 5 mmHg, the estimated right ventricular systolic pressure is 23.5 mmHg. Left Atrium: Left atrial size was normal in size. Right Atrium: Right atrial size was normal in size. Pericardium: There is no evidence of pericardial effusion. Mitral Valve: The mitral valve is normal in structure. Trivial mitral valve regurgitation. No evidence of mitral valve stenosis. MV peak gradient, 3.7 mmHg. The mean mitral valve gradient is 2.0 mmHg. Tricuspid Valve: The tricuspid valve is normal in structure. Tricuspid valve regurgitation is trivial. No evidence of tricuspid stenosis. Aortic Valve: The aortic valve is normal in structure. Aortic valve regurgitation is not visualized. No aortic stenosis is present. Aortic valve mean gradient measures 4.0 mmHg. Aortic valve peak gradient measures 7.0 mmHg. Aortic valve area, by VTI measures 2.26 cm. Pulmonic Valve: The pulmonic valve was normal in structure. Pulmonic valve regurgitation is mild. No evidence of pulmonic stenosis. Aorta: The aortic root is normal in size and structure. Venous: The inferior  vena cava was not well visualized. IAS/Shunts: No atrial level shunt detected by color flow Doppler.  LEFT VENTRICLE PLAX 2D LVIDd:         4.95 cm   Diastology LVIDs:         3.30 cm   LV e' medial:    12.20 cm/s LV PW:         1.00 cm   LV E/e' medial:  6.6 LV IVS:        1.05 cm   LV e' lateral:   17.40 cm/s LVOT diam:     2.00 cm   LV E/e' lateral: 4.6 LV SV:         61 LV SV Index:   28 LVOT Area:     3.14 cm  RIGHT VENTRICLE RV Basal diam:  3.60 cm RV Mid diam:    2.40 cm RV S prime:     12.40 cm/s TAPSE (M-mode): 2.7 cm LEFT ATRIUM             Index        RIGHT ATRIUM           Index LA diam:        2.60 cm 1.18 cm/m   RA Area:     18.30 cm LA Vol (A2C):   33.6 ml 15.24 ml/m  RA Volume:   44.80 ml  20.32 ml/m LA Vol (A4C):   35.1 ml 15.92 ml/m LA Biplane Vol: 37.5 ml 17.01 ml/m  AORTIC VALVE AV Area (Vmax):    2.26 cm AV Area (Vmean):   2.23 cm AV Area (VTI):     2.26 cm AV Vmax:           132.00 cm/s AV Vmean:          89.900 cm/s AV VTI:            0.270 m AV Peak Grad:      7.0 mmHg AV Mean Grad:      4.0 mmHg LVOT Vmax:         95.00 cm/s LVOT Vmean:        63.700 cm/s LVOT VTI:          0.194 m LVOT/AV VTI ratio: 0.72  AORTA Ao Root diam: 3.60 cm MITRAL VALVE               TRICUSPID VALVE MV Area (PHT): 3.24 cm    TR Peak grad:   18.5 mmHg MV Area VTI:   2.34 cm    TR Vmax:        215.00 cm/s MV Peak grad:  3.7 mmHg MV Mean grad:  2.0 mmHg    SHUNTS MV Vmax:       0.97 m/s    Systemic VTI:  0.19 m MV Vmean:      64.1 cm/s   Systemic Diam: 2.00 cm MV Decel Time: 234 msec MV E velocity: 80.70 cm/s MV A velocity: 64.50 cm/s MV E/A ratio:  1.25 Lorine Bears MD Electronically signed by Lorine Bears MD Signature Date/Time: 12/22/2023/8:18:43 AM    Final    US Paracentesis Result Date: 12/18/2023 INDICATION: 53 year old male with history of cirrhosis and recurrent ascites, currently in emergency department for abdominal pain; interventional radiology consulted for therapeutic paracentesis.  EXAM: ULTRASOUND GUIDED RIGHT PARACENTESIS MEDICATIONS: 1% lidocaine 20 mL COMPLICATIONS: None immediate. PROCEDURE: Informed written consent was obtained from the patient after a discussion of the risks, benefits and alternatives to treatment. A timeout was performed prior to the initiation of the procedure. Initial ultrasound scanning demonstrates a large amount of ascites within the right lower abdominal quadrant. The right lower abdomen was prepped and draped in the usual sterile fashion. 1% lidocaine was used for local anesthesia. Following this, a 19 gauge, 7-cm, Yueh catheter was introduced. An ultrasound image was saved for documentation purposes. The paracentesis was performed. The catheter was removed and a dressing was applied. The patient tolerated the procedure well without immediate post procedural complication. Patient received post-procedure intravenous albumin; see nursing notes for details. FINDINGS: A total of approximately 4.1 L of clear yellow fluid was removed. Samples were sent to the laboratory as requested by the clinical team. IMPRESSION: Successful ultrasound-guided paracentesis yielding 4.1 L liters of peritoneal fluid. Performed By Theresa Mulligan, PA-C PLAN: The patient has required >/=2 paracenteses in a 30 day period and a formal evaluation by the [facility] Interventional Radiology Portal Hypertension Clinic has been arranged. Roanna Banning, MD [facility] Radiology Electronically Signed   By: Roanna Banning M.D.   On: 12/18/2023 16:07   DG Chest Portable 1 View Result Date: 12/17/2023 CLINICAL DATA:  Shortness of breath and tachycardia EXAM: PORTABLE CHEST 1 VIEW COMPARISON:  09/02/2023 FINDINGS: The heart size and mediastinal contours are within normal limits. Both lungs are clear. The visualized skeletal structures are unremarkable. IMPRESSION: No active disease. Electronically Signed   By: Jasmine Pang M.D.   On: 12/17/2023 22:49    CT ABDOMEN PELVIS W CONTRAST Result Date: 12/17/2023 CLINICAL DATA:  Abdomen pain swelling history of cirrhosis EXAM: CT ABDOMEN AND PELVIS WITH CONTRAST TECHNIQUE: Multidetector CT imaging of the abdomen and pelvis was performed using the standard protocol following bolus administration of intravenous contrast. RADIATION DOSE REDUCTION: This exam was performed according to the departmental dose-optimization program which includes automated exposure control, adjustment of the mA and/or kV according to patient size and/or use of iterative reconstruction technique. CONTRAST:  OMNIPAQUE IOHEXOL 350 MG/ML SOLN COMPARISON:  MRI 12/02/2023, CT 11/18/2023, 07/10/2023 FINDINGS: Lower chest: Lung bases demonstrate small right-sided pleural effusion Hepatobiliary: Coarse heterogeneous nodular liver parenchyma consistent with history of cirrhosis. Hypodense exophytic area off the inferior right hepatic lobe without suspicious mass on interval  MRI. Mild hyperdense sludge or small stones in the gallbladder. No biliary dilatation Pancreas: Unremarkable. No pancreatic ductal dilatation or surrounding inflammatory changes. Spleen: Mildly enlarged with craniocaudal measurement of 14 cm. Adrenals/Urinary Tract: Adrenal glands are unremarkable. Kidneys are normal, without renal calculi, focal lesion, or hydronephrosis. Bladder is unremarkable. Stomach/Bowel: Stomach is within normal limits. Appendectomy. No evidence of bowel wall thickening, distention, or inflammatory changes. Vascular/Lymphatic: Aortic atherosclerosis. No enlarged abdominal or pelvic lymph nodes. Recanalized paraumbilical vein. Small perigastric varices. Reproductive: Prostate is unremarkable. Other: No free air.  Moderate volume abdominopelvic ascites Musculoskeletal: No acute or suspicious osseous abnormality. IMPRESSION: 1. Liver cirrhosis with portal hypertension. Moderate volume abdominopelvic ascites. 2. Small right pleural effusion. 3. Aortic  atherosclerosis. Electronically Signed   By: Jasmine Pang M.D.   On: 12/17/2023 22:48   MR ABDOMEN WWO CONTRAST Result Date: 12/09/2023 CLINICAL DATA:  Cirrhosis, varices, liver lesion queried by prior CT EXAM: MRI ABDOMEN WITHOUT AND WITH CONTRAST TECHNIQUE: Multiplanar multisequence MR imaging of the abdomen was performed both before and after the administration of intravenous contrast. CONTRAST:  10mL GADAVIST GADOBUTROL 1 MMOL/ML IV SOLN COMPARISON:  CT abdomen pelvis, 11/18/2023 FINDINGS: Lower chest: No acute abnormality. Hepatobiliary: Coarse, nodular cirrhotic morphology of the liver. Marked, heterogeneous hepatic steatosis. Diffusely heterogeneous, nodular parenchymal enhancement. No arterially hyperenhancing liver lesions. There is a conspicuous parenchymal lobulation arising from the inferior right lobe of the liver, hepatic segment VI, at the site of a previously queried mass, which is however without arterial hyperenhancement or other suspicious features (series 14, image 64). No gallstones, gallbladder wall thickening, or biliary dilatation. Pancreas: Unremarkable. No pancreatic ductal dilatation or surrounding inflammatory changes. Spleen: Mild splenomegaly, maximum coronal span 14.2 cm. Adrenals/Urinary Tract: Adrenal glands are unremarkable. Kidneys are normal, without renal calculi, solid lesion, or hydronephrosis. Stomach/Bowel: Stomach is within normal limits. No evidence of bowel wall thickening, distention, or inflammatory changes. Vascular/Lymphatic: Aortic atherosclerosis. No enlarged abdominal lymph nodes. Other: No abdominal wall hernia or abnormality. Moderate volume ascites. Musculoskeletal: No acute or significant osseous findings. IMPRESSION: 1. Cirrhosis and marked hepatic steatosis. 2. No arterially hyperenhancing liver lesions. There is a conspicuous parenchymal lobulation arising from the inferior right lobe of the liver, hepatic segment VI, at the site of a previously queried  mass, which is however without arterial hyperenhancement or other suspicious features. No specific follow-up or characterization is required. Consider six-month hepatocellular carcinoma surveillance in the setting of cirrhosis. 3. Moderate volume ascites. Aortic Atherosclerosis (ICD10-I70.0). Electronically Signed   By: Jearld Lesch M.D.   On: 12/09/2023 07:17   US Paracentesis Result Date: 12/08/2023 INDICATION: Patient with a history of cirrhosis with recurrent ascites. Interventional radiology asked to perform a therapeutic paracentesis. EXAM: ULTRASOUND GUIDED PARACENTESIS MEDICATIONS: 1% lidocaine 20 mL COMPLICATIONS: None immediate. PROCEDURE: Informed written consent was obtained from the patient after a discussion of the risks, benefits and alternatives to treatment. A timeout was performed prior to the initiation of the procedure. Initial ultrasound scanning demonstrates a large amount of ascites within the right lower abdominal quadrant. The right lower abdomen was prepped and draped in the usual sterile fashion. 1% lidocaine was used for local anesthesia. Following this, a 19 gauge, 7-cm, Yueh catheter was introduced. An ultrasound image was saved for documentation purposes. The paracentesis was performed. The catheter was removed and a dressing was applied. The patient tolerated the procedure well without immediate post procedural complication. Patient received post-procedure intravenous albumin; see nursing notes for details. FINDINGS: A total of approximately 5.7 L  of clear yellow fluid was removed. IMPRESSION: Successful ultrasound-guided paracentesis yielding 5.7 liters of peritoneal fluid. PLAN: The patient has required >/=2 paracenteses in a 30 day period and a formal evaluation by the [facility] Interventional Radiology Portal Hypertension Clinic has been arranged. Electronically Signed   By: Malachy Moan M.D.   On: 12/08/2023 14:27   US Paracentesis Result Date: 12/01/2023 INDICATION:  53 year old male with history of alcoholic cirrhosis of the liver with ascites. Request for diagnostic and therapeutic paracentesis. EXAM: ULTRASOUND GUIDED right PARACENTESIS MEDICATIONS: 1% lidocaine, 10 mL. COMPLICATIONS: None immediate. PROCEDURE: Informed written consent was obtained from the patient after a discussion of the risks, benefits and alternatives to treatment. A timeout was performed prior to the initiation of the procedure. Initial ultrasound scanning demonstrates a large amount of ascites within the right lower abdominal quadrant. The right lower abdomen was prepped and draped in the usual sterile fashion. 1% lidocaine was used for local anesthesia. Following this, a 19 gauge, 7-cm, Yueh catheter was introduced. An ultrasound image was saved for documentation purposes. The paracentesis was performed. The catheter was removed and a dressing was applied. The patient tolerated the procedure well without immediate post procedural complication. Patient received post-procedure intravenous albumin; see nursing notes for details. FINDINGS: A total of approximately 5.6 L of clear yellow fluid was removed. Samples were sent to the laboratory as requested by the clinical team. IMPRESSION: Successful ultrasound-guided paracentesis yielding 5.6 liters of peritoneal fluid. Procedure performed by: Sherlene Shams, PA-C Electronically Signed   By: Malachy Moan M.D.   On: 12/01/2023 12:45    Microbiology: Results for orders placed or performed during the hospital encounter of 12/17/23  Body fluid culture w Gram Stain     Status: None   Collection Time: 12/18/23  2:55 PM   Specimen: PATH Cytology Peritoneal fluid  Result Value Ref Range Status   Specimen Description   Final    PERITONEAL Performed at [facility], [address], [place], [place] [address]    Special Requests   Final    NONE Performed at [facility], [address] [place], [place] [address]    Gram  Stain NO WBC SEEN NO ORGANISMS SEEN   Final   Culture   Final    NO GROWTH 3 DAYS Performed at [facility], [address] [place], [place] [address]    Report Status 12/22/2023 FINAL  Final    Labs: CBC: Recent Labs  Lab 12/21/23 0436 12/22/23 0700 12/23/23 0500 12/25/23 0543 12/26/23 0422  WBC 6.6 7.1 6.5 8.0 8.3  HGB 10.6* 11.6* 10.5* 10.9* 11.4*  HCT 30.4* 33.9* 30.7* 30.9* 32.4*  MCV 100.0 103.0* 100.3* 97.8 97.9  PLT 50* 61* 61* 84* 89*   Basic Metabolic Panel: Recent Labs  Lab 12/20/23 0422 12/21/23 0436 12/22/23 0700 12/23/23 0500 12/24/23 0509 12/25/23 0543 12/26/23 0422  NA 133* 133* 135 133* 134* 132* 135  K 3.9 4.4 4.3 4.0 3.5 3.2* 3.4*  CL 102 101 102 106 105 102 102  CO2 25 24 25 23 25 26 26   GLUCOSE 130* 133* 132* 128* 119* 102* 101*  BUN 11 13 15 16 20  22* 22*  CREATININE 0.73 0.68 0.77 0.67 0.93 0.77 0.85  CALCIUM 8.0* 8.0* 8.3* 8.0* 7.9* 7.8* 8.2*  MG  --  1.9 2.1  --   --   --   --   PHOS 2.9 3.6 3.8  --   --   --   --  Liver Function Tests: Recent Labs  Lab 12/20/23 0422 12/22/23 0700 12/24/23 0509 12/26/23 0422  AST 160* 171* 142* 108*  ALT 63* 84* 91* 81*  ALKPHOS 160* 165* 168* 139*  BILITOT 4.1* 3.4* 2.7* 2.6*  PROT 5.9* 6.1* 6.3* 5.6*  ALBUMIN 2.4* 2.5* 2.5* 2.5*   CBG: Recent Labs  Lab 12/20/23 2038 12/25/23 0855  GLUCAP 142* 107*    Discharge time spent: 37 minutes.  Signed: Marcelino Duster, MD [facility] 12/26/2023

## 2023-12-26 NOTE — TOC Progression Note (Addendum)
 Transition of Care [facility]) - Progression Note    Patient Details  Name: Omar Turner MRN: [id number] Date of Birth: Feb 28, 1971  [facility]) CM/SW Contact  Liliana Cline, LCSW Phone Number: 12/26/2023, 11:49 AM  Clinical Narrative:    CSW met with patient and mother at bedside. Patient is agreeable to OPPT. Provided list of OPPT facilities. Patient would like referral sent to [facility]. Patient plans to contact his insurance to make sure they are in network, and will call other OPPT facility if needed.  12:45- Notified that patient and mother requested to speak to Social Work. Called into room. Patient's mother states the MD told her Social Work would meet with them - reviewed the above discussion we had in room earlier about OPPT. Checked with MD- no additional TOC needs at this time.       Expected Discharge Plan and Services                                               Social Determinants of Health (SDOH) Interventions SDOH Screenings   Food Insecurity: No Food Insecurity (12/18/2023)  Housing: Low Risk  (12/18/2023)  Transportation Needs: No Transportation Needs (12/18/2023)  Utilities: Not At Risk (12/18/2023)  Financial Resource Strain: Low Risk  (12/11/2020)   Received from [facility]  Social Connections: Patient Unable To Answer (11/18/2023)  Tobacco Use: High Risk (12/17/2023)    Readmission Risk Interventions    12/18/2023    2:33 PM  Readmission Risk Prevention Plan  Transportation Screening Complete  PCP or Specialist Appt within 3-5 Days Complete  HRI or Home Care Consult Complete  Social Work Consult for Recovery Care Planning/Counseling Complete  Palliative Care Screening Not Applicable  Medication Review [employer]) Complete

## 2023-12-26 NOTE — Progress Notes (Signed)
 Occupational Therapy * Physical Therapy * Speech Therapy  DATE 12/25/33 PATIENT NAME Omar Turner PATIENT MRN [id number]  DIAGNOSIS/DIAGNOSIS CODE I48.91 DATE OF DISCHARGE   PRIMARY CARE PHYSICIAN Dr. Graciela Husbands  PCP PHONE/FAX [phone]  Dear Provider (Name: [facility]:   I certify that I have examined this patient and that occupational/physical/speech therapy is necessary on an outpatient basis.    The patient has expressed interest in completing their recommended course of therapy at your location.  Once a formal order from the patient's primary care physician has been obtained, please contact him/her to schedule an appointment for evaluation at your earliest convenience.  [ x ]  Physical Therapy Evaluate and Treat  The patient's primary care physician (listed above) must furnish and be responsible for a formal order such that the recommended services may be furnished while under the primary physician's care, and that the plan of care will be established and reviewed every 30 days (or more often if condition necessitates).

## 2023-12-26 NOTE — Progress Notes (Signed)
 Physical Therapy Treatment Patient Details Name: Omar Turner MRN: [id number] DOB: [birth year] Today's Date: 12/26/2023   History of Present Illness Omar Turner is a 53 y.o. male with medical history significant of alcohol abuse, alcoholic liver cirrhosis with ascites, esophageal varices, GI bleeding, HTN, HLD, dCHF, anemia, GERD, duodenitis, skin cancer, who presents with abdominal pain, abdominal distention and heart racing. Patient is admitted for new onset Afib with RVR, Alcohol abuse, ascitis, hyponatremia, hypokalemia.    PT Comments  Pt was long sitting in bed with supportive family at bedside. Pt is A and O x 4. Remains cooperative and pleasant throughout. Did not endorse dizziness/lightheadedness at rest. BP 102/74(82), upon sitting up EOB 89/62(72), after two minutes EOB 87/67(74). Pt easily able to stand and ambulate however symptoms arrived during ambulation only a short distance. BP after ambulating ~ 50 ft 93/65(75). As soon as pt return to bed BP elevated to 114/72(86) MAP remained >72 throughout session however pt was symptomatic. MD made aware. Acute PT will continue to follow. Pt may not need post acute PT since he is strong and moves well however currently recommending OPPT at DC.    If plan is discharge home, recommend the following: A little help with walking and/or transfers;Help with stairs or ramp for entrance     Equipment Recommendations  None recommended by PT       Precautions / Restrictions Precautions Precautions: Fall Recall of Precautions/Restrictions: Intact Restrictions Weight Bearing Restrictions Per Provider Order: No     Mobility  Bed Mobility Overal bed mobility: Independent   Transfers Overall transfer level: Independent Equipment used: None   Ambulation/Gait Ambulation/Gait assistance: Supervision Gait Distance (Feet): 50 Feet Assistive device: None Gait Pattern/deviations: WFL(Within Functional Limits) Gait velocity: WNL  General  Gait Details: pt has no LOB however did endorse symptoms of lightheadedness/dizziness after short gait distance   Balance Overall balance assessment: Independent     Communication Communication Communication: No apparent difficulties  Cognition Arousal: Alert Behavior During Therapy: WFL for tasks assessed/performed   PT - Cognitive impairments: No apparent impairments  PT - Cognition Comments: pt is A and O x 4. supportive family in room throughout session. no dizziness or symptoms of hypotension at rest Following commands: Intact      Cueing Cueing Techniques: Verbal cues         Pertinent Vitals/Pain Pain Assessment Pain Assessment: No/denies pain     PT Goals (current goals can now be found in the care plan section) Acute Rehab PT Goals Patient Stated Goal: to go home Progress towards PT goals: Progressing toward goals    Frequency    Min 1X/week       AM-PAC PT "6 Clicks" Mobility   Outcome Measure  Help needed turning from your back to your side while in a flat bed without using bedrails?: None Help needed moving from lying on your back to sitting on the side of a flat bed without using bedrails?: None Help needed moving to and from a bed to a chair (including a wheelchair)?: None Help needed standing up from a chair using your arms (e.g., wheelchair or bedside chair)?: None Help needed to walk in hospital room?: None Help needed climbing 3-5 steps with a railing? : None 6 Click Score: 24    End of Session   Activity Tolerance: Patient tolerated treatment well Patient left: in bed;with family/visitor present Nurse Communication: Mobility status PT Visit Diagnosis: Unsteadiness on feet (R26.81)     Time: 0865-7846 PT  Time Calculation (min) (ACUTE ONLY): 13 min  Charges:    $Gait Training: 8-22 mins PT General Charges $$ ACUTE PT VISIT: 1 Visit                     Jetta Lout PTA 12/26/23, 11:19 AM

## 2023-12-26 NOTE — Plan of Care (Signed)

## 2023-12-26 NOTE — Progress Notes (Signed)
 PHARMACY CONSULT NOTE - ELECTROLYTES  Pharmacy Consult for Electrolyte Monitoring and Replacement   Recent Labs: Height: 5\' 11"  (180.3 cm) Weight: 103 kg (227 lb 1.2 oz) IBW/kg (Calculated) : 75.3 Estimated Creatinine Clearance: 127.1 mL/min (by C-G formula based on SCr of 0.85 mg/dL).  Potassium (mmol/L)  Date Value  12/26/2023 3.4 (L)  11/02/2013 3.7   Magnesium (mg/dL)  Date Value  40/98/1191 2.1   Calcium (mg/dL)  Date Value  47/82/9562 8.2 (L)   Calcium, Total (mg/dL)  Date Value  13/05/6577 8.9   Albumin (g/dL)  Date Value  46/96/2952 2.5 (L)  06/12/2016 4.7  11/02/2013 4.2   Phosphorus (mg/dL)  Date Value  84/13/2440 3.8   Sodium (mmol/L)  Date Value  12/26/2023 135  06/12/2016 140  11/02/2013 138    Assessment  Omar Turner is a 53 y.o. male presenting with Abd pain,new onset Afib, ascites . PMH significant for alcohol abuse, alcoholic liver cirrhosis with ascites, esophageal varices, GI bleeding, HTN, HLD, dCHF, anemia, GERD, duodenitis, skin cancer, thrombocytopenia. Patient reports 3-4 times of diarrhea each day and drink about a pint of liquor daily. Pharmacy has been consulted to monitor and replace electrolytes.  Diet: regular diet with fluid restriction MIVF: none Pertinent medications:  sodium chloride 1 g BID, new start eplerenone, holding lasix.   Goal of Therapy: Electrolytes WNL Goal K>4.0 and Mag >2.0   for Afib  Plan:  Medical team ordered Kcl BID x 4 doses.   Paschal Dopp, PharmD, BCPS 12/26/2023 8:15 AM

## 2023-12-30 ENCOUNTER — Other Ambulatory Visit: Payer: Self-pay | Admitting: Gastroenterology

## 2023-12-30 DIAGNOSIS — K7031 Alcoholic cirrhosis of liver with ascites: Secondary | ICD-10-CM

## 2023-12-31 ENCOUNTER — Ambulatory Visit
Admission: RE | Admit: 2023-12-31 | Discharge: 2023-12-31 | Disposition: A | Discharge status: Home / Self Care | Source: Ambulatory Visit | Attending: Gastroenterology | Admitting: Gastroenterology

## 2023-12-31 DIAGNOSIS — K7031 Alcoholic cirrhosis of liver with ascites: Secondary | ICD-10-CM | POA: Diagnosis present

## 2023-12-31 DIAGNOSIS — I509 Heart failure, unspecified: Secondary | ICD-10-CM | POA: Diagnosis not present

## 2023-12-31 MED ORDER — LIDOCAINE HCL (PF) 2 % IJ SOLN
10.0000 mL | Freq: Once | INTRAMUSCULAR | Status: AC
  Administered 2023-12-31: 10 mL via INTRADERMAL
  Filled 2023-12-31: qty 10

## 2023-12-31 MED ORDER — ALBUMIN HUMAN 25 % IV SOLN
INTRAVENOUS | Status: AC
  Filled 2023-12-31: qty 100

## 2023-12-31 MED ORDER — ALBUMIN HUMAN 25 % IV SOLN
25.0000 g | Freq: Once | INTRAVENOUS | Status: AC
  Administered 2023-12-31: 25 g via INTRAVENOUS

## 2023-12-31 NOTE — Procedures (Signed)
 PROCEDURE SUMMARY:  Successful image-guided therapeutic paracentesis from the right lower abdomen.  Yielded 6.1 liters of yellow fluid.  No immediate complications.  EBL: zero Patient tolerated well.   Please see imaging section of Epic for full dictation.  The patient has required >/=2 paracenteses in a 30 day period and a screening evaluation by the [facility] Interventional Radiology [facility] Hypert[facility] has been arranged per 12/23/23 procedure note. 12/26/23 hospital discharge note advises patient to follow up  with outpatient GI and hepatology for transplant workup.   Tais Koestner NP 12/31/2023 1:30 PM

## 2024-01-05 ENCOUNTER — Encounter: Payer: Self-pay | Admitting: Internal Medicine

## 2024-01-06 ENCOUNTER — Encounter
Admission: RE | Disposition: A | Discharge status: Home / Self Care | Payer: Self-pay | Source: Home / Self Care | Attending: Internal Medicine

## 2024-01-06 ENCOUNTER — Other Ambulatory Visit: Payer: Self-pay | Admitting: Gastroenterology

## 2024-01-06 ENCOUNTER — Ambulatory Visit
Admission: RE | Admit: 2024-01-06 | Discharge: 2024-01-06 | Disposition: A | Discharge status: Home / Self Care | Attending: Internal Medicine | Admitting: Internal Medicine

## 2024-01-06 ENCOUNTER — Ambulatory Visit: Admitting: Anesthesiology

## 2024-01-06 ENCOUNTER — Encounter: Payer: Self-pay | Admitting: Internal Medicine

## 2024-01-06 DIAGNOSIS — K7031 Alcoholic cirrhosis of liver with ascites: Secondary | ICD-10-CM

## 2024-01-06 DIAGNOSIS — Z79899 Other long term (current) drug therapy: Secondary | ICD-10-CM | POA: Diagnosis not present

## 2024-01-06 DIAGNOSIS — K229 Disease of esophagus, unspecified: Secondary | ICD-10-CM | POA: Insufficient documentation

## 2024-01-06 DIAGNOSIS — K219 Gastro-esophageal reflux disease without esophagitis: Secondary | ICD-10-CM | POA: Diagnosis not present

## 2024-01-06 DIAGNOSIS — I8510 Secondary esophageal varices without bleeding: Secondary | ICD-10-CM | POA: Diagnosis present

## 2024-01-06 DIAGNOSIS — K766 Portal hypertension: Secondary | ICD-10-CM | POA: Insufficient documentation

## 2024-01-06 DIAGNOSIS — K7030 Alcoholic cirrhosis of liver without ascites: Secondary | ICD-10-CM | POA: Diagnosis not present

## 2024-01-06 DIAGNOSIS — I10 Essential (primary) hypertension: Secondary | ICD-10-CM | POA: Diagnosis not present

## 2024-01-06 DIAGNOSIS — Z7952 Long term (current) use of systemic steroids: Secondary | ICD-10-CM | POA: Diagnosis not present

## 2024-01-06 DIAGNOSIS — K3189 Other diseases of stomach and duodenum: Secondary | ICD-10-CM | POA: Insufficient documentation

## 2024-01-06 HISTORY — PX: ESOPHAGOGASTRODUODENOSCOPY: SHX5428

## 2024-01-06 HISTORY — PX: ESOPHAGEAL BANDING: SHX5518

## 2024-01-06 HISTORY — DX: Other ascites: R18.8

## 2024-01-06 HISTORY — PX: ESOPHAGEAL BRUSHING: SHX6842

## 2024-01-06 HISTORY — DX: Unspecified cirrhosis of liver: K74.60

## 2024-01-06 LAB — KOH PREP: KOH Prep: NONE SEEN

## 2024-01-06 SURGERY — EGD (ESOPHAGOGASTRODUODENOSCOPY)
Anesthesia: General

## 2024-01-06 MED ORDER — FENTANYL CITRATE (PF) 100 MCG/2ML IJ SOLN
INTRAMUSCULAR | Status: DC | PRN
  Administered 2024-01-06 (×2): 25 ug via INTRAVENOUS

## 2024-01-06 MED ORDER — ACETAMINOPHEN 160 MG/5ML PO SUSP
ORAL | Status: AC
  Filled 2024-01-06: qty 20

## 2024-01-06 MED ORDER — PROPOFOL 10 MG/ML IV BOLUS
INTRAVENOUS | Status: DC | PRN
  Administered 2024-01-06: 200 mg via INTRAVENOUS
  Administered 2024-01-06: 20 mg via INTRAVENOUS
  Administered 2024-01-06: 30 mg via INTRAVENOUS
  Administered 2024-01-06: 100 mg via INTRAVENOUS

## 2024-01-06 MED ORDER — LIDOCAINE HCL (CARDIAC) PF 100 MG/5ML IV SOSY
PREFILLED_SYRINGE | INTRAVENOUS | Status: DC | PRN
  Administered 2024-01-06: 60 mg via INTRAVENOUS

## 2024-01-06 MED ORDER — SODIUM CHLORIDE 0.9 % IV SOLN: INTRAVENOUS | Status: DC

## 2024-01-06 MED ORDER — ACETAMINOPHEN 160 MG/5ML PO SUSP
500.0000 mg | Freq: Four times a day (QID) | ORAL | Status: DC | PRN
  Administered 2024-01-06: 500 mg via ORAL

## 2024-01-06 MED ORDER — PROPOFOL 10 MG/ML IV BOLUS
INTRAVENOUS | Status: AC
  Filled 2024-01-06: qty 20

## 2024-01-06 MED ORDER — FENTANYL CITRATE (PF) 100 MCG/2ML IJ SOLN
INTRAMUSCULAR | Status: AC
  Filled 2024-01-06: qty 2

## 2024-01-06 MED ORDER — DEXMEDETOMIDINE HCL IN NACL 80 MCG/20ML IV SOLN
INTRAVENOUS | Status: DC | PRN
Start: 2024-01-06 — End: 2024-01-06
  Administered 2024-01-06: 8 ug via INTRAVENOUS

## 2024-01-06 NOTE — H&P (Signed)
 Outpatient short stay form Pre-procedure 01/06/2024 12:38 PM Omar Turner, M.D.  Primary Physician: Daniel Nones III, M.D.  Reason for visit:  Portal hypertension ,cirrhosis, hx Esophageal varices.  History of present illness:  Patient presents for EGD to eradicate hx of Grade II EV banded x 4 by Dr. Servando Snare on 11/18/2023. Patient denies intractable heartburn, dysphagia, hemetemesis, abdominal pain, nausea or vomiting.      Current Facility-Administered Medications:    0.9 %  sodium chloride infusion, , Intravenous, Continuous, [place], Boykin Nearing, MD, Last Rate: 20 mL/hr at 01/06/24 1126, New Bag at 01/06/24 1126  Medications Prior to Admission  Medication Sig Dispense Refill Last Dose/Taking   ferrous sulfate 324 MG TBEC Take 1 tablet (324 mg total) by mouth every other day. 30 tablet 0 Past Month   furosemide (LASIX) 20 MG tablet Take 1 tablet (20 mg total) by mouth daily. 30 tablet 2 01/05/2024   nadolol (CORGARD) 20 MG tablet Take 0.5 tablets (10 mg total) by mouth daily. 45 tablet 1 01/05/2024   naltrexone (DEPADE) 50 MG tablet Take 1 tablet by mouth daily.   01/05/2024   pantoprazole (PROTONIX) 40 MG tablet Take 1 tablet (40 mg total) by mouth daily. 30 tablet 0 01/05/2024   albuterol (VENTOLIN HFA) 108 (90 Base) MCG/ACT inhaler Inhale 1-2 puffs into the lungs every 6 (six) hours as needed.      cetirizine (ZYRTEC) 10 MG tablet Take 10 mg by mouth daily.      eplerenone (INSPRA) 25 MG tablet Take 25 mg by mouth daily.      folic acid (FOLVITE) 1 MG tablet Take 1 tablet (1 mg total) by mouth daily. 90 tablet 1    Multiple Vitamin (MULTIVITAMIN WITH MINERALS) TABS tablet Take 1 tablet by mouth daily. 90 tablet 1    nicotine (NICODERM CQ - DOSED IN MG/24 HOURS) 21 mg/24hr patch Place 1 patch (21 mg total) onto the skin daily. 28 patch 0    ondansetron (ZOFRAN-ODT) 4 MG disintegrating tablet Take 1 tablet (4 mg total) by mouth every 8 (eight) hours as needed for nausea or vomiting. 20 tablet 0     potassium chloride (KLOR-CON) 10 MEQ tablet Take 10 mEq by mouth daily.      predniSONE (DELTASONE) 20 MG tablet Take 20 mg by mouth 2 (two) times daily.      thiamine (VITAMIN B-1) 100 MG tablet Take 1 tablet (100 mg total) by mouth daily. 90 tablet 1      Allergies  Allergen Reactions   Chantix [Varenicline]    Lexapro [Escitalopram]           Past Medical History:  Diagnosis Date   Anginal pain (HCC)    Anxiety    Ascites    Basal cell carcinoma 11/26/2022   Superficial, left spinal upper back, clear with biopsy   Basal cell carcinoma 11/26/2022   L post neck, clear with biopsy   Basal cell carcinoma 05/29/2021   anterior neck - R anterolateral, exc at [facility]   Basal cell carcinoma 06/26/2021   L med scapular back, exc at [facility] cell carcinoma 06/12/2021   L post forearm, Rober Minion   Basal cell carcinoma 06/12/2021   R scapular back, Rober Minion   Basal cell carcinoma 06/12/2021   Central scapular back   Basal cell carcinoma 12/04/2022   Left Upper Arm - Anterior, [facility]   Basal cell carcinoma 12/04/2022   L upper chest inferior, [facility]  Basal cell carcinoma 12/04/2022   left upper chest lateral, [facility]   Basal cell carcinoma 12/04/2022   left upper chest medial, [facility]   Basal cell carcinoma 12/30/2022   R post neck, [facility]   Basal cell carcinoma 04/01/2023   left mid chest, [facility]   Cirrhosis (HCC)    Degenerative disc disease, cervical    Diverticulitis large intestine    dysplastic nevus 11/26/2022   R spinal mid back, shave removal 12/30/22   GERD (gastroesophageal reflux disease)    History of chest pain    Hyperlipidemia    Hypertension    Sleep apnea    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    Physical Exam  Gen: Alert, oriented. Appears stated age.   HEENT: [place]/AT. PERRLA. Lungs: CTA, no wheezes. CV: RR nl S1, S2. Abd: soft, benign, no masses. BS+ Ext: No edema. Pulses 2+    Planned procedures: Proceed with EGD. The patient understands the nature of the planned procedure, indications, risks, alternatives and potential complications including but not limited to bleeding, infection, perforation, damage to internal organs and possible oversedation/side effects from anesthesia. The patient agrees and gives consent to proceed.  Please refer to procedure notes for findings, recommendations and patient disposition/instructions.     Omar Turner K. Norma Turner, M.D. Gastroenterology 01/06/2024  12:38 PM

## 2024-01-06 NOTE — Anesthesia Preprocedure Evaluation (Signed)
 Anesthesia Evaluation  Patient identified by MRN, date of birth, ID band Patient awake    Reviewed: Allergy & Precautions, NPO status , Patient's Chart, lab work & pertinent test results  History of Anesthesia Complications Negative for: history of anesthetic complications  Airway Mallampati: III  TM Distance: <3 FB Neck ROM: full    Dental  (+) Chipped, Dental Advidsory Given   Pulmonary neg shortness of breath, sleep apnea , neg COPD, neg recent URI, Current Smoker and Patient abstained from smoking.   Pulmonary exam normal        Cardiovascular Exercise Tolerance: Good hypertension, (-) angina +CHF (diastolic)  (-) Past MI Normal cardiovascular exam     Neuro/Psych  PSYCHIATRIC DISORDERS Anxiety     negative neurological ROS     GI/Hepatic ,GERD  Controlled,,(+) Cirrhosis  (last paracentesis was last week (3/24 or 3/25))  Esophageal Varices and ascites      Endo/Other  negative endocrine ROS    Renal/GU negative Renal ROS  negative genitourinary   Musculoskeletal   Abdominal   Peds  Hematology  (+) Blood dyscrasia, anemia   Anesthesia Other Findings Patient is NPO appropriate and reports no nausea or vomiting today.  Past Medical History: No date: Anginal pain (HCC) No date: Anxiety 11/26/2022: Basal cell carcinoma     Comment:  Superficial, left spinal upper back, clear with biopsy 11/26/2022: Basal cell carcinoma     Comment:  L post neck, clear with biopsy 05/29/2021: Basal cell carcinoma     Comment:  anterior neck - R anterolateral, exc at [facility] 06/26/2021: Basal cell carcinoma     Comment:  L med scapular back, exc at [facility] 06/12/2021: Basal cell carcinoma     Comment:  L post forearm, Rober Minion 06/12/2021: Basal cell carcinoma     Comment:  R scapular back, [facility] Minion 06/12/2021: Basal cell carcinoma     Comment:  Central scapular back 12/04/2022: Basal cell carcinoma      Comment:  Left Upper Arm - Anterior, [facility] 12/04/2022: Basal cell carcinoma     Comment:  L upper chest inferior, [facility] 12/04/2022: Basal cell carcinoma     Comment:  left upper chest lateral, [facility] 12/04/2022: Basal cell carcinoma     Comment:  left upper chest medial, [facility] 12/30/2022: Basal cell carcinoma     Comment:  R post neck, [facility] 04/01/2023: Basal cell carcinoma     Comment:  left mid chest, [facility] No date: Degenerative disc disease, cervical No date: Diverticulitis large intestine 11/26/2022: dysplastic nevus     Comment:  R spinal mid back, shave removal 12/30/22 No date: GERD (gastroesophageal reflux disease) No date: History of chest pain No date: Hyperlipidemia No date: Hypertension No date: Sleep apnea 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     Comment:  L post forearm, Rober Minion 11/26/2022: Squamous cell carcinoma of skin     Comment:  L upper mid back, in situ, clear with biopsy No date: Tobacco abuse  Past Surgical History: No date: APPENDECTOMY 06/17/2016: CARDIAC CATHETERIZATION; Left     Comment:  Procedure: Left Heart Cath and Coronary Angiography;                Surgeon: Iran Ouch, MD;  Location: [facility] INVASIVE               CV LAB;  Service: Cardiovascular;  Laterality: Left; No date: COLONOSCOPY 09/07/2015: COLONOSCOPY WITH PROPOFOL; N/A     Comment:  Procedure: COLONOSCOPY WITH  PROPOFOL;  Surgeon: Wallace Cullens, MD;  Location: [facility] ENDOSCOPY;  Service:               Gastroenterology;  Laterality: N/A; 05/12/2018: COLONOSCOPY WITH PROPOFOL; N/A     Comment:  Procedure: COLONOSCOPY WITH PROPOFOL;  Surgeon: Wyline Mood, MD;  Location: [facility] ENDOSCOPY;  Service:               Gastroenterology;  Laterality: N/A; 02/26/2022: COLONOSCOPY WITH PROPOFOL; N/A     Comment:  Procedure: COLONOSCOPY WITH PROPOFOL;  Surgeon:               Napoleon Form, MD;  Location: [facility] ENDOSCOPY;                Service: Gastroenterology;  Laterality:  N/A; 05/09/2023: ESOPHAGEAL BANDING     Comment:  Procedure: ESOPHAGEAL BANDING;  Surgeon: Stanton Kidney, MD;  Location: [facility] ENDOSCOPY;  Service:               Gastroenterology;; 11/16/2021: ESOPHAGOGASTRODUODENOSCOPY (EGD) WITH PROPOFOL; N/A     Comment:  Procedure: ESOPHAGOGASTRODUODENOSCOPY (EGD) WITH               PROPOFOL;  Surgeon: Jaynie Collins, DO;  Location:              [facility] ENDOSCOPY;  Service: Gastroenterology;  Laterality:               N/A; 02/26/2022: ESOPHAGOGASTRODUODENOSCOPY (EGD) WITH PROPOFOL; N/A     Comment:  Procedure: ESOPHAGOGASTRODUODENOSCOPY (EGD) WITH               PROPOFOL;  Surgeon: Napoleon Form, MD;  Location:               [facility] ENDOSCOPY;  Service: Gastroenterology;  Laterality:               N/A; 05/09/2023: ESOPHAGOGASTRODUODENOSCOPY (EGD) WITH PROPOFOL; N/A     Comment:  Procedure: ESOPHAGOGASTRODUODENOSCOPY (EGD) WITH               PROPOFOL;  Surgeon: [place], Boykin Nearing, MD;  Location:               [facility] ENDOSCOPY;  Service: Gastroenterology;  Laterality:               N/A; No date: HERNIA REPAIR 02/01/2021: IR RADIOLOGIST EVAL & MGMT 02/26/2022: POLYPECTOMY     Comment:  Procedure: POLYPECTOMY;  Surgeon: Napoleon Form,               MD;  Location: [facility] ENDOSCOPY;  Service: Gastroenterology;; 01/17/2021: VENTRAL HERNIA REPAIR; N/A     Comment:  Procedure: HERNIA REPAIR VENTRAL ADULT;  Surgeon: Leafy Ro, MD;  Location: [facility] ORS;  Service: General;                Laterality: N/A;  BMI    Body Mass Index: 32.08 kg/m      Reproductive/Obstetrics negative OB ROS                             Anesthesia Physical Anesthesia Plan  ASA: 4  Anesthesia Plan: General   Post-op Pain Management:    Induction: Intravenous  PONV Risk Score and Plan: 2 and Propofol infusion, TIVA and Treatment may vary due to age or medical condition  Airway Management Planned: Natural Airway and  Nasal Cannula  Additional Equipment:   Intra-op Plan:   Post-operative Plan:   Informed Consent: I have reviewed the patients History and Physical, chart, labs and discussed the procedure including the risks, benefits and alternatives for the proposed anesthesia with the patient or authorized representative who has indicated his/her understanding and acceptance.     Dental Advisory Given  Plan Discussed with: Anesthesiologist, CRNA and Surgeon  Anesthesia Plan Comments: (Patient consented for risks of anesthesia including but not limited to:  - adverse reactions to medications - risk of airway placement if required - damage to eyes, teeth, lips or other oral mucosa - nerve damage due to positioning  - sore throat or hoarseness - Damage to heart, brain, nerves, lungs, other parts of body or loss of life  Patient voiced understanding and assent.)       Anesthesia Quick Evaluation

## 2024-01-06 NOTE — Anesthesia Procedure Notes (Signed)
 Date/Time: 01/06/2024 12:50 PM  Performed by: Rich Brave, CRNAPre-anesthesia Checklist: Patient identified, Emergency Drugs available, Suction available, Patient being monitored and Timeout performed Patient Re-evaluated:Patient Re-evaluated prior to induction Oxygen Delivery Method: Nasal cannula Induction Type: IV induction Placement Confirmation: positive ETCO2

## 2024-01-06 NOTE — Op Note (Signed)
 [facility] Gastroenterology Patient Name: Omar Turner Procedure Date: 01/06/2024 12:40 PM MRN: [id number] Account #: [id number] Date of Birth: 1971/06/18 Admit Type: Outpatient Age: 53 Room: [facility] ENDO ROOM 3 Gender: Male Note Status: Finalized Instrument Name: Laurette Schimke [id number] Procedure:             Upper GI endoscopy Indications:           2nd degree variceal eradication (following bleed),                         Alcoholic cirrhosis, Portal venous hypertension Providers:             Boykin Nearing. Norma Fredrickson MD, MD Referring MD:          Daniel Nones, MD (Referring MD) Medicines:             Propofol per Anesthesia Complications:         No immediate complications. Estimated blood loss: None. Procedure:             Pre-Anesthesia Assessment:                        - The risks and benefits of the procedure and the                         sedation options and risks were discussed with the                         patient. All questions were answered and informed                         consent was obtained.                        - Patient identification and proposed procedure were                         verified prior to the procedure by the nurse. The                         procedure was verified in the procedure room.                        - ASA Grade Assessment: III - A patient with severe                         systemic disease.                        - After reviewing the risks and benefits, the patient                         was deemed in satisfactory condition to undergo the                         procedure.                        After obtaining informed consent, the endoscope was  passed under direct vision. Throughout the procedure,                         the patient's blood pressure, pulse, and oxygen                         saturations were monitored continuously. The Endoscope                         was introduced through  the mouth, and advanced to the                         third part of duodenum. The upper GI endoscopy was                         accomplished without difficulty. The patient tolerated                         the procedure well. Findings:      The examined duodenum was normal.      Moderate portal hypertensive gastropathy was found in the entire       examined stomach.      There is no endoscopic evidence of varices in the cardia.      Grade II varices were found in the lower third of the esophagus. Two       bands were successfully placed with complete eradication, resulting in       deflation of varices. There was no bleeding during and at the end of the       procedure. Estimated blood loss: none.      Patchy, white plaques were found in the upper third of the esophagus and       in the middle third of the esophagus. Cells for cytology were obtained       by brushing.      The exam was otherwise without abnormality. Impression:            - Normal examined duodenum.                        - Portal hypertensive gastropathy.                        - Grade II esophageal varices. Completely eradicated.                         Banded.                        - Esophageal plaques were found, suspicious for                         candidiasis. Cells for cytology obtained.                        - The examination was otherwise normal. Recommendation:        - Patient has a contact number available for                         emergencies. The signs and symptoms of potential  delayed complications were discussed with the patient.                         Return to normal activities tomorrow. Written                         discharge instructions were provided to the patient.                        - Resume previous diet.                        - Continue present medications.                        - Repeat upper endoscopy in 6 weeks for surveillance.                         - The findings and recommendations were discussed with                         the patient. Procedure Code(s):     --- Professional ---                        [phone], Esophagogastroduodenoscopy, flexible,                         transoral; with band ligation of esophageal/gastric                         varices Diagnosis Code(s):     --- Professional ---                        K22.9, Disease of esophagus, unspecified                        I85.00, Esophageal varices without bleeding                        K76.6, Portal hypertension                        K31.89, Other diseases of stomach and duodenum CPT copyright 2022 American [employer]. All rights reserved. The codes documented in this report are preliminary and upon coder review may  be revised to meet current compliance requirements. Stanton Kidney MD, MD 01/06/2024 1:14:23 PM This report has been signed electronically. Number of Addenda: 0 Note Initiated On: 01/06/2024 12:40 PM Estimated Blood Loss:  Estimated blood loss: none. Estimated blood loss: none.      [facility]

## 2024-01-06 NOTE — Transfer of Care (Signed)
 Immediate Anesthesia Transfer of Care Note  Patient: YIGIT NORKUS  Procedure(s) Performed: EGD (ESOPHAGOGASTRODUODENOSCOPY) ESOPHAGOSCOPY, WITH BRUSH BIOPSY ESOPHAGOSCOPY, WITH ESOPHAGEAL VARICES BAND LIGATION  Patient Location: Endoscopy Unit  Anesthesia Type:General  Level of Consciousness: drowsy and responds to stimulation  Airway & Oxygen Therapy: Patient Spontanous Breathing  Post-op Assessment: Report given to RN and Post -op Vital signs reviewed and stable  Post vital signs: Reviewed and stable  Last Vitals:  Vitals Value Taken Time  BP 101/83 01/06/24 1315  Temp 36 C 01/06/24 1315  Pulse 80 01/06/24 1316  Resp 20 01/06/24 1316  SpO2 96 % 01/06/24 1316  Vitals shown include unfiled device data.  Last Pain:  Vitals:   01/06/24 1315  TempSrc:   PainSc: Asleep         Complications: No notable events documented.

## 2024-01-06 NOTE — Anesthesia Postprocedure Evaluation (Signed)
 Anesthesia Post Note  Patient: Omar Turner  Procedure(s) Performed: EGD (ESOPHAGOGASTRODUODENOSCOPY) ESOPHAGOSCOPY, WITH BRUSH BIOPSY ESOPHAGOSCOPY, WITH ESOPHAGEAL VARICES BAND LIGATION  Patient location during evaluation: Endoscopy Anesthesia Type: General Level of consciousness: awake and alert Pain management: pain level controlled Vital Signs Assessment: post-procedure vital signs reviewed and stable Respiratory status: spontaneous breathing, nonlabored ventilation, respiratory function stable and patient connected to nasal cannula oxygen Cardiovascular status: blood pressure returned to baseline and stable Postop Assessment: no apparent nausea or vomiting Anesthetic complications: no   No notable events documented.   Last Vitals:  Vitals:   01/06/24 1327 01/06/24 1337  BP: 130/83 135/84  Pulse: 78 81  Resp: 18 13  Temp:  (!) 36.1 C  SpO2: 99%     Last Pain:  Vitals:   01/06/24 1337  TempSrc: Temporal  PainSc:                  Lenard Simmer

## 2024-01-06 NOTE — Interval H&P Note (Signed)
 History and Physical Interval Note:  01/06/2024 12:41 PM  Omar Turner  has presented today for surgery, with the diagnosis of Alcoholic cirrhosis of liver with ascites (CMS/HHS-HCC) (K70.31) Esophageal varices in alcoholic cirrhosis (CMS/HHS-HCC) (K70.30,I85.10) H/O: upper GI bleed (Z87.19) Encephalopathy, hepatic (CMS/HHS-HCC) (K76.82) Portal hypertensive gastropathy  (CMS/HHS-HCC) (K76.6,K31.89) Black stools (K92.1) Nausea without vomiting (R11.0).  The various methods of treatment have been discussed with the patient and family. After consideration of risks, benefits and other options for treatment, the patient has consented to  Procedure(s): EGD (ESOPHAGOGASTRODUODENOSCOPY) (N/A) as a surgical intervention.  The patient's history has been reviewed, patient examined, no change in status, stable for surgery.  I have reviewed the patient's chart and labs.  Questions were answered to the patient's satisfaction.     [place], [place]

## 2024-01-07 ENCOUNTER — Encounter: Payer: Self-pay | Admitting: Internal Medicine

## 2024-01-07 ENCOUNTER — Ambulatory Visit
Admission: RE | Admit: 2024-01-07 | Discharge: 2024-01-07 | Disposition: A | Discharge status: Home / Self Care | Source: Ambulatory Visit | Attending: Gastroenterology | Admitting: Gastroenterology

## 2024-01-07 DIAGNOSIS — K7031 Alcoholic cirrhosis of liver with ascites: Secondary | ICD-10-CM | POA: Diagnosis present

## 2024-01-07 MED ORDER — ALBUMIN HUMAN 25 % IV SOLN
25.0000 g | Freq: Once | INTRAVENOUS | Status: AC
  Administered 2024-01-07: 25 g via INTRAVENOUS

## 2024-01-07 MED ORDER — LIDOCAINE HCL (PF) 1 % IJ SOLN
10.0000 mL | Freq: Once | INTRAMUSCULAR | Status: AC
  Administered 2024-01-07: 10 mL via SUBCUTANEOUS

## 2024-01-07 MED ORDER — ALBUMIN HUMAN 25 % IV SOLN
INTRAVENOUS | Status: AC
  Filled 2024-01-07: qty 100

## 2024-01-07 NOTE — Procedures (Signed)
 PROCEDURE SUMMARY:  Successful ultrasound guided paracentesis from the right mid abdomen. Yielded 7 L of clear yellow fluid.  No immediate complications.  The patient tolerated the procedure well.   Specimen was not sent for labs.  EBL <  1mL  The patient has previously been evaluated by the [facility] Interventional Radiology Portal Hypertension Clinic, however the patient is being seen by [facility] transplant team for evaluation and possible TIPS in May.  Pattricia Boss D, PA-C 01/07/2024, 11:05 AM

## 2024-01-12 ENCOUNTER — Other Ambulatory Visit: Payer: Self-pay | Admitting: Gastroenterology

## 2024-01-12 DIAGNOSIS — K7031 Alcoholic cirrhosis of liver with ascites: Secondary | ICD-10-CM

## 2024-01-14 ENCOUNTER — Ambulatory Visit
Admission: RE | Admit: 2024-01-14 | Discharge: 2024-01-14 | Disposition: A | Discharge status: Home / Self Care | Source: Ambulatory Visit | Attending: Gastroenterology | Admitting: Gastroenterology

## 2024-01-14 DIAGNOSIS — K766 Portal hypertension: Secondary | ICD-10-CM | POA: Diagnosis not present

## 2024-01-14 DIAGNOSIS — K7031 Alcoholic cirrhosis of liver with ascites: Secondary | ICD-10-CM | POA: Diagnosis present

## 2024-01-14 MED ORDER — LIDOCAINE HCL (PF) 1 % IJ SOLN
10.0000 mL | Freq: Once | INTRAMUSCULAR | Status: AC
  Administered 2024-01-14: 10 mL via INTRADERMAL

## 2024-01-14 MED ORDER — ALBUMIN HUMAN 25 % IV SOLN
50.0000 g | Freq: Once | INTRAVENOUS | Status: AC
  Administered 2024-01-14: 25 g via INTRAVENOUS

## 2024-01-14 MED ORDER — ALBUMIN HUMAN 25 % IV SOLN
INTRAVENOUS | Status: AC
  Filled 2024-01-14: qty 200

## 2024-01-14 MED ORDER — ALBUMIN HUMAN 25 % IV SOLN
12.5000 g | Freq: Once | INTRAVENOUS | Status: AC
Start: 2024-01-14 — End: 2024-01-14
  Administered 2024-01-14: 12.5 g via INTRAVENOUS

## 2024-01-14 NOTE — Procedures (Signed)
 PROCEDURE SUMMARY:  Successful ultrasound guided paracentesis from the RUQ. Yielded 7 L of clear yellow fluid.  No immediate complications.  The patient tolerated the procedure well.   Specimen was not sent for labs.  EBL <  1mL  The patient has previously been evaluated by the [facility] Interventional Radiology Portal Hypertension Clinic, however the patient is being seen by [facility] transplant team for evaluation and possible TIPS in May per the patient and chart review.  Berneta Levins, PA-C 01/14/2024, 9:55 AM

## 2024-01-21 ENCOUNTER — Other Ambulatory Visit: Payer: Self-pay | Admitting: Gastroenterology

## 2024-01-21 DIAGNOSIS — K7031 Alcoholic cirrhosis of liver with ascites: Secondary | ICD-10-CM

## 2024-01-23 ENCOUNTER — Ambulatory Visit
Admission: RE | Admit: 2024-01-23 | Discharge: 2024-01-23 | Disposition: A | Discharge status: Home / Self Care | Source: Ambulatory Visit | Attending: Gastroenterology | Admitting: Gastroenterology

## 2024-01-23 DIAGNOSIS — K7031 Alcoholic cirrhosis of liver with ascites: Secondary | ICD-10-CM | POA: Diagnosis not present

## 2024-01-23 MED ORDER — ALBUMIN HUMAN 25 % IV SOLN
25.0000 g | Freq: Once | INTRAVENOUS | Status: AC
  Administered 2024-01-23: 25 g via INTRAVENOUS

## 2024-01-23 MED ORDER — LIDOCAINE HCL (PF) 1 % IJ SOLN
16.0000 mL | Freq: Once | INTRAMUSCULAR | Status: AC
  Administered 2024-01-23: 16 mL via SUBCUTANEOUS
  Filled 2024-01-23: qty 16

## 2024-01-23 MED ORDER — ALBUMIN HUMAN 25 % IV SOLN
INTRAVENOUS | Status: AC
  Filled 2024-01-23: qty 100

## 2024-01-23 NOTE — Procedures (Signed)
 Interventional Radiology Procedure:   Indications: Cirrhosis and recurrent ascites  Procedure: US  guided paracentesis  Findings: Removed 4900 ml from right lower quadrant  Complications: None     EBL: Less than 10 ml   Rashawn Rayman R. Julietta Ogren, MD  Pager: [phone]

## 2024-01-28 ENCOUNTER — Other Ambulatory Visit: Payer: Self-pay | Admitting: Gastroenterology

## 2024-01-28 DIAGNOSIS — K7031 Alcoholic cirrhosis of liver with ascites: Secondary | ICD-10-CM

## 2024-01-29 ENCOUNTER — Ambulatory Visit
Admission: RE | Admit: 2024-01-29 | Discharge: 2024-01-29 | Disposition: A | Discharge status: Home / Self Care | Source: Ambulatory Visit | Attending: Gastroenterology | Admitting: Gastroenterology

## 2024-01-29 DIAGNOSIS — K7031 Alcoholic cirrhosis of liver with ascites: Secondary | ICD-10-CM | POA: Insufficient documentation

## 2024-01-29 MED ORDER — ALBUMIN HUMAN 25 % IV SOLN: 25.0000 g | Freq: Once | INTRAVENOUS | Status: AC

## 2024-01-29 MED ORDER — ALBUMIN HUMAN 25 % IV SOLN
INTRAVENOUS | Status: AC
  Administered 2024-01-29: 25 g via INTRAVENOUS
  Filled 2024-01-29: qty 100

## 2024-01-29 MED ORDER — LIDOCAINE HCL (PF) 1 % IJ SOLN
10.0000 mL | Freq: Once | INTRAMUSCULAR | Status: AC
  Administered 2024-01-29: 10 mL via INTRADERMAL
  Filled 2024-01-29: qty 10

## 2024-01-29 NOTE — Procedures (Signed)
 PROCEDURE SUMMARY:  Successful ultrasound guided paracentesis from the right lower quadrant.  Yielded 4.2 L of clear yellow fluid.  No immediate complications.  The patient tolerated the procedure well.   Specimen was not sent for labs.  EBL < 5mL  The patient has previously been formally evaluated by the [facility] Interventional Radiology Portal Hypertension Clinic and is being actively followed for potential future intervention. Per patient and chart review he is followed by [facility] hepatology with plans for TIPS/possible transplant.  Nathan Bake, PA-C

## 2024-02-04 ENCOUNTER — Ambulatory Visit
Admission: RE | Admit: 2024-02-04 | Discharge: 2024-02-04 | Disposition: A | Discharge status: Home / Self Care | Source: Ambulatory Visit | Attending: Gastroenterology | Admitting: Gastroenterology

## 2024-02-04 DIAGNOSIS — K7031 Alcoholic cirrhosis of liver with ascites: Secondary | ICD-10-CM | POA: Diagnosis present

## 2024-02-04 MED ORDER — ALBUMIN HUMAN 25 % IV SOLN
25.0000 g | Freq: Once | INTRAVENOUS | Status: AC
  Administered 2024-02-04: 25 g via INTRAVENOUS

## 2024-02-04 MED ORDER — ALBUMIN HUMAN 25 % IV SOLN
INTRAVENOUS | Status: AC
  Filled 2024-02-04: qty 100

## 2024-02-04 MED ORDER — LIDOCAINE HCL (PF) 1 % IJ SOLN
15.0000 mL | Freq: Once | INTRAMUSCULAR | Status: AC
  Administered 2024-02-04: 15 mL via SUBCUTANEOUS
  Filled 2024-02-04: qty 15

## 2024-02-04 NOTE — Procedures (Signed)
 PROCEDURE SUMMARY:  Successful image-guided paracentesis from the right lateral abdomen.  Yielded 2.0 liters of clear yellow fluid.  No immediate complications.  EBL: trace Patient tolerated well.   Specimen not sent for labs.  Please see imaging section of Epic for full dictation.  [facility] Marijayne Rauth PA-C 02/04/2024 8:58 AM

## 2024-02-11 ENCOUNTER — Ambulatory Visit
Admission: RE | Admit: 2024-02-11 | Discharge: 2024-02-11 | Disposition: A | Discharge status: Home / Self Care | Source: Ambulatory Visit | Attending: Gastroenterology | Admitting: Gastroenterology

## 2024-02-11 DIAGNOSIS — K7031 Alcoholic cirrhosis of liver with ascites: Secondary | ICD-10-CM | POA: Diagnosis present

## 2024-02-11 MED ORDER — ALBUMIN HUMAN 25 % IV SOLN
INTRAVENOUS | Status: AC
  Filled 2024-02-11: qty 100

## 2024-02-11 MED ORDER — LIDOCAINE HCL (PF) 1 % IJ SOLN
10.0000 mL | Freq: Once | INTRAMUSCULAR | Status: AC
  Administered 2024-02-11: 10 mL via INTRADERMAL
  Filled 2024-02-11: qty 10

## 2024-02-11 MED ORDER — ALBUMIN HUMAN 25 % IV SOLN
25.0000 g | Freq: Once | INTRAVENOUS | Status: AC
  Administered 2024-02-11: 25 g via INTRAVENOUS

## 2024-02-11 NOTE — Procedures (Signed)
 PROCEDURE SUMMARY:  Successful image-guided paracentesis from the right lateral abdomen.  Yielded 2.8 liters of yellow fluid.  No immediate complications.  EBL: trace Patient tolerated well.   Specimen not sent for labs.  Please see imaging section of Epic for full dictation.  [facility] Randall Colden PA-C 02/11/2024 9:23 AM

## 2024-02-12 ENCOUNTER — Other Ambulatory Visit: Payer: Self-pay | Admitting: Gastroenterology

## 2024-02-12 DIAGNOSIS — K7031 Alcoholic cirrhosis of liver with ascites: Secondary | ICD-10-CM

## 2024-02-18 ENCOUNTER — Ambulatory Visit

## 2024-02-19 ENCOUNTER — Ambulatory Visit
Admission: RE | Admit: 2024-02-19 | Discharge: 2024-02-19 | Disposition: A | Discharge status: Home / Self Care | Source: Ambulatory Visit | Attending: Gastroenterology | Admitting: Gastroenterology

## 2024-02-19 DIAGNOSIS — K7031 Alcoholic cirrhosis of liver with ascites: Secondary | ICD-10-CM | POA: Insufficient documentation

## 2024-02-19 MED ORDER — ALBUMIN HUMAN 25 % IV SOLN
25.0000 g | Freq: Once | INTRAVENOUS | Status: AC
  Administered 2024-02-19: 25 g via INTRAVENOUS

## 2024-02-19 MED ORDER — ALBUMIN HUMAN 25 % IV SOLN
INTRAVENOUS | Status: AC
  Filled 2024-02-19: qty 100

## 2024-02-19 MED ORDER — LIDOCAINE HCL (PF) 1 % IJ SOLN
10.0000 mL | Freq: Once | INTRAMUSCULAR | Status: AC
  Administered 2024-02-19: 10 mL via INTRADERMAL

## 2024-02-19 NOTE — Procedures (Signed)
 PROCEDURE SUMMARY:  Successful image-guided paracentesis from the right lateral abdomen.  Yielded 2.8 liters of yellow fluid.  No immediate complications.  EBL: trace Patient tolerated well.   Specimen not sent for labs.  Please see imaging section of Epic for full dictation.  [facility] Joycelin Radloff PA-C 02/19/2024 11:05 AM

## 2024-02-23 ENCOUNTER — Other Ambulatory Visit: Payer: Self-pay | Admitting: Gastroenterology

## 2024-02-23 DIAGNOSIS — K7031 Alcoholic cirrhosis of liver with ascites: Secondary | ICD-10-CM

## 2024-02-24 ENCOUNTER — Ambulatory Visit
Admission: RE | Admit: 2024-02-24 | Discharge: 2024-02-24 | Disposition: A | Discharge status: Home / Self Care | Attending: Internal Medicine | Admitting: Internal Medicine

## 2024-02-24 ENCOUNTER — Encounter: Payer: Self-pay | Admitting: Internal Medicine

## 2024-02-24 ENCOUNTER — Ambulatory Visit: Admitting: General Practice

## 2024-02-24 ENCOUNTER — Other Ambulatory Visit: Payer: Self-pay

## 2024-02-24 ENCOUNTER — Encounter
Admission: RE | Disposition: A | Discharge status: Home / Self Care | Payer: Self-pay | Source: Home / Self Care | Attending: Internal Medicine

## 2024-02-24 DIAGNOSIS — K7031 Alcoholic cirrhosis of liver with ascites: Secondary | ICD-10-CM | POA: Diagnosis not present

## 2024-02-24 DIAGNOSIS — K766 Portal hypertension: Secondary | ICD-10-CM | POA: Diagnosis not present

## 2024-02-24 DIAGNOSIS — I8510 Secondary esophageal varices without bleeding: Secondary | ICD-10-CM | POA: Diagnosis present

## 2024-02-24 DIAGNOSIS — I10 Essential (primary) hypertension: Secondary | ICD-10-CM | POA: Diagnosis not present

## 2024-02-24 DIAGNOSIS — G4730 Sleep apnea, unspecified: Secondary | ICD-10-CM | POA: Insufficient documentation

## 2024-02-24 DIAGNOSIS — D759 Disease of blood and blood-forming organs, unspecified: Secondary | ICD-10-CM | POA: Diagnosis not present

## 2024-02-24 DIAGNOSIS — K3189 Other diseases of stomach and duodenum: Secondary | ICD-10-CM | POA: Diagnosis not present

## 2024-02-24 DIAGNOSIS — D649 Anemia, unspecified: Secondary | ICD-10-CM | POA: Diagnosis not present

## 2024-02-24 HISTORY — PX: ESOPHAGOGASTRODUODENOSCOPY: SHX5428

## 2024-02-24 SURGERY — EGD (ESOPHAGOGASTRODUODENOSCOPY)
Anesthesia: General

## 2024-02-24 MED ORDER — SODIUM CHLORIDE 0.9 % IV SOLN: INTRAVENOUS | Status: DC

## 2024-02-24 MED ORDER — PROPOFOL 500 MG/50ML IV EMUL
INTRAVENOUS | Status: DC | PRN
  Administered 2024-02-24: 130 ug/kg/min via INTRAVENOUS

## 2024-02-24 MED ORDER — PROPOFOL 10 MG/ML IV BOLUS
INTRAVENOUS | Status: AC
  Filled 2024-02-24: qty 20

## 2024-02-24 MED ORDER — LIDOCAINE HCL (CARDIAC) PF 100 MG/5ML IV SOSY
PREFILLED_SYRINGE | INTRAVENOUS | Status: DC | PRN
  Administered 2024-02-24: 50 mg via INTRAVENOUS

## 2024-02-24 MED ORDER — PROPOFOL 10 MG/ML IV BOLUS
INTRAVENOUS | Status: DC | PRN
  Administered 2024-02-24: 70 mg via INTRAVENOUS
  Administered 2024-02-24: 20 mg via INTRAVENOUS
  Administered 2024-02-24: 40 mg via INTRAVENOUS

## 2024-02-24 NOTE — Op Note (Signed)
 [facility] Gastroenterology Patient Name: Omar Turner Procedure Date: 02/24/2024 11:48 AM MRN: [id number] Account #: [id number] Date of Birth: 14-Oct-1970 Admit Type: Outpatient Age: 53 Room: [facility] ENDO ROOM 3 Gender: Male Note Status: Finalized Instrument Name: Upper Endoscope [id number] Procedure:             Upper GI endoscopy Indications:           2nd degree variceal eradication (following bleed),                         Portal venous hypertension Providers:             Smt Lokey K. Corky Diener MD, MD Referring MD:          Eddy Goodell, MD (Referring MD) Medicines:             Propofol  per Anesthesia Complications:         No immediate complications. Estimated blood loss: None. Procedure:             Pre-Anesthesia Assessment:                        - The risks and benefits of the procedure and the                         sedation options and risks were discussed with the                         patient. All questions were answered and informed                         consent was obtained.                        - Patient identification and proposed procedure were                         verified prior to the procedure by the nurse. The                         procedure was verified in the procedure room.                        - ASA Grade Assessment: III - A patient with severe                         systemic disease.                        - After reviewing the risks and benefits, the patient                         was deemed in satisfactory condition to undergo the                         procedure.                        After obtaining informed consent, the endoscope was  passed under direct vision. Throughout the procedure,                         the patient's blood pressure, pulse, and oxygen                         saturations were monitored continuously. The Endoscope                         was introduced through the mouth, and  advanced to the                         third part of duodenum. The upper GI endoscopy was                         accomplished without difficulty. The patient tolerated                         the procedure well. Findings:      Small (< 5 mm) varices were found in the lower third of the esophagus.       Estimated blood loss: none.      The exam of the esophagus was otherwise normal.      Moderate portal hypertensive gastropathy was found in the entire       examined stomach.      There is no endoscopic evidence of bleeding, ulceration or varices in       the entire examined stomach.      The examined duodenum was normal. Impression:            - Small (< 5 mm) esophageal varices.                        - Portal hypertensive gastropathy.                        - Normal examined duodenum.                        - No specimens collected. Recommendation:        - Patient has a contact number available for                         emergencies. The signs and symptoms of potential                         delayed complications were discussed with the patient.                         Return to normal activities tomorrow. Written                         discharge instructions were provided to the patient.                        - Resume previous diet.                        - Continue present medications.                        -  Repeat upper endoscopy in 3 months to assess disease                         activity.                        - Follow up with Jamee Mazzoni, PA-C in the GI office.                         [phone]                        - Telephone GI office to schedule appointment in 1                         month.                        - The findings and recommendations were discussed with                         the patient. Procedure Code(s):     --- Professional ---                        [phone], Esophagogastroduodenoscopy, flexible,                         transoral;  diagnostic, including collection of                         specimen(s) by brushing or washing, when performed                         (separate procedure) Diagnosis Code(s):     --- Professional ---                        K76.6, Portal hypertension                        K31.89, Other diseases of stomach and duodenum                        I85.00, Esophageal varices without bleeding CPT copyright 2022 American Medical Association. All rights reserved. The codes documented in this report are preliminary and upon coder review may  be revised to meet current compliance requirements. Cassie Click MD, MD 02/24/2024 12:07:01 PM This report has been signed electronically. Number of Addenda: 0 Note Initiated On: 02/24/2024 11:48 AM Estimated Blood Loss:  Estimated blood loss: none.      [facility]

## 2024-02-24 NOTE — Anesthesia Preprocedure Evaluation (Signed)
 Anesthesia Evaluation  Patient identified by MRN, date of birth, ID band Patient awake    Reviewed: Allergy & Precautions, NPO status , Patient's Chart, lab work & pertinent test results  History of Anesthesia Complications Negative for: history of anesthetic complications  Airway Mallampati: III  TM Distance: <3 FB Neck ROM: full    Dental  (+) Chipped, Dental Advidsory Given   Pulmonary neg shortness of breath, sleep apnea , Current Smoker and Patient abstained from smoking.   Pulmonary exam normal        Cardiovascular Exercise Tolerance: Good hypertension, (-) angina (-) Past MI Normal cardiovascular exam     Neuro/Psych  PSYCHIATRIC DISORDERS Anxiety     negative neurological ROS     GI/Hepatic ,GERD  Controlled,,(+) Cirrhosis         Endo/Other  negative endocrine ROS    Renal/GU      Musculoskeletal   Abdominal   Peds  Hematology  (+) Blood dyscrasia, anemia   Anesthesia Other Findings Patient is NPO appropriate and reports no nausea or vomiting today.  Past Medical History: No date: Anginal pain (HCC) No date: Anxiety 11/26/2022: Basal cell carcinoma     Comment:  Superficial, left spinal upper back, clear with biopsy 11/26/2022: Basal cell carcinoma     Comment:  L post neck, clear with biopsy 05/29/2021: Basal cell carcinoma     Comment:  anterior neck - R anterolateral, exc at [facility] 06/26/2021: Basal cell carcinoma     Comment:  L med scapular back, exc at [facility] 06/12/2021: Basal cell carcinoma     Comment:  L post forearm, [facility] 06/12/2021: Basal cell carcinoma     Comment:  R scapular back, [facility] 06/12/2021: Basal cell carcinoma     Comment:  Central scapular back 12/04/2022: Basal cell carcinoma     Comment:  Left Upper Arm - Anterior, [facility] 12/04/2022: Basal cell carcinoma     Comment:  L upper chest inferior, [facility] 12/04/2022: Basal cell carcinoma     Comment:   left upper chest lateral, [facility] 12/04/2022: Basal cell carcinoma     Comment:  left upper chest medial, [facility] 12/30/2022: Basal cell carcinoma     Comment:  R post neck, [facility] 04/01/2023: Basal cell carcinoma     Comment:  left mid chest, [facility] No date: Degenerative disc disease, cervical No date: Diverticulitis large intestine 11/26/2022: dysplastic nevus     Comment:  R spinal mid back, shave removal 12/30/22 No date: GERD (gastroesophageal reflux disease) No date: History of chest pain No date: Hyperlipidemia No date: Hypertension No date: Sleep apnea 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     Comment:  L post forearm, [facility] 11/26/2022: Squamous cell carcinoma of skin     Comment:  L upper mid back, in situ, clear with biopsy No date: Tobacco abuse  Past Surgical History: No date: APPENDECTOMY 06/17/2016: CARDIAC CATHETERIZATION; Left     Comment:  Procedure: Left Heart Cath and Coronary Angiography;                Surgeon: Wenona Hamilton, MD;  Location: [facility] INVASIVE               CV LAB;  Service: Cardiovascular;  Laterality: Left; No date: COLONOSCOPY 09/07/2015: COLONOSCOPY WITH PROPOFOL ; N/A     Comment:  Procedure: COLONOSCOPY WITH PROPOFOL ;  Surgeon: Stephens Eis, MD;  Location: [facility] ENDOSCOPY;  Service:               Gastroenterology;  Laterality: N/A; 05/12/2018: COLONOSCOPY WITH PROPOFOL ; N/A     Comment:  Procedure: COLONOSCOPY WITH PROPOFOL ;  Surgeon: Luke Salaam, MD;  Location: [facility] ENDOSCOPY;  Service:               Gastroenterology;  Laterality: N/A; 02/26/2022: COLONOSCOPY WITH PROPOFOL ; N/A     Comment:  Procedure: COLONOSCOPY WITH PROPOFOL ;  Surgeon:               Sergio Dandy, MD;  Location: [facility] ENDOSCOPY;                Service: Gastroenterology;  Laterality: N/A; 05/09/2023: ESOPHAGEAL BANDING     Comment:  Procedure: ESOPHAGEAL BANDING;  Surgeon: Cassie Click, MD;  Location: [facility] ENDOSCOPY;  Service:                Gastroenterology;; 11/16/2021: ESOPHAGOGASTRODUODENOSCOPY (EGD) WITH PROPOFOL ; N/A     Comment:  Procedure: ESOPHAGOGASTRODUODENOSCOPY (EGD) WITH               PROPOFOL ;  Surgeon: Quintin Buckle, DO;  Location:              [facility] ENDOSCOPY;  Service: Gastroenterology;  Laterality:               N/A; 02/26/2022: ESOPHAGOGASTRODUODENOSCOPY (EGD) WITH PROPOFOL ; N/A     Comment:  Procedure: ESOPHAGOGASTRODUODENOSCOPY (EGD) WITH               PROPOFOL ;  Surgeon: Sergio Dandy, MD;  Location:               [facility] ENDOSCOPY;  Service: Gastroenterology;  Laterality:               N/A; 05/09/2023: ESOPHAGOGASTRODUODENOSCOPY (EGD) WITH PROPOFOL ; N/A     Comment:  Procedure: ESOPHAGOGASTRODUODENOSCOPY (EGD) WITH               PROPOFOL ;  Surgeon: Toledo, Alphonsus Jeans, MD;  Location:               [facility] ENDOSCOPY;  Service: Gastroenterology;  Laterality:               N/A; No date: HERNIA REPAIR 02/01/2021: IR RADIOLOGIST EVAL & MGMT 02/26/2022: POLYPECTOMY     Comment:  Procedure: POLYPECTOMY;  Surgeon: Sergio Dandy,               MD;  Location: [facility] ENDOSCOPY;  Service: Gastroenterology;; 01/17/2021: VENTRAL HERNIA REPAIR; N/A     Comment:  Procedure: HERNIA REPAIR VENTRAL ADULT;  Surgeon: Alben Alma, MD;  Location: [facility] ORS;  Service: General;                Laterality: N/A;  BMI    Body Mass Index: 32.08 kg/m      Reproductive/Obstetrics negative OB ROS                             Anesthesia Physical Anesthesia Plan  ASA: 3  Anesthesia Plan: General   Post-op Pain Management: Minimal or no pain anticipated   Induction: Intravenous  PONV Risk Score and  Plan: 3 and Propofol  infusion, TIVA and Ondansetron   Airway Management Planned: Nasal Cannula  Additional Equipment: None  Intra-op Plan:   Post-operative Plan:   Informed Consent: I have reviewed the patients History and Physical, chart, labs and discussed the procedure  including the risks, benefits and alternatives for the proposed anesthesia with the patient or authorized representative who has indicated his/her understanding and acceptance.     Dental advisory given  Plan Discussed with: CRNA and Surgeon  Anesthesia Plan Comments: (Discussed risks of anesthesia with patient, including possibility of difficulty with spontaneous ventilation under anesthesia necessitating airway intervention, PONV, and rare risks such as cardiac or respiratory or neurological events, and allergic reactions. Discussed the role of CRNA in patient's perioperative care. Patient understands.)       Anesthesia Quick Evaluation

## 2024-02-24 NOTE — Anesthesia Postprocedure Evaluation (Signed)
 Anesthesia Post Note  Patient: Omar Turner  Procedure(s) Performed: EGD (ESOPHAGOGASTRODUODENOSCOPY)  Patient location during evaluation: Endoscopy Anesthesia Type: General Level of consciousness: awake and alert Pain management: pain level controlled Vital Signs Assessment: post-procedure vital signs reviewed and stable Respiratory status: spontaneous breathing, nonlabored ventilation, respiratory function stable and patient connected to nasal cannula oxygen Cardiovascular status: blood pressure returned to baseline and stable Postop Assessment: no apparent nausea or vomiting Anesthetic complications: no  No notable events documented.   Last Vitals:  Vitals:   02/24/24 1216 02/24/24 1226  BP: 98/66 105/71  Pulse: 67 65  Resp: 13 19  Temp:    SpO2: 100% 100%    Last Pain:  Vitals:   02/24/24 1226  TempSrc:   PainSc: 0-No pain                 Enrique Harvest

## 2024-02-24 NOTE — H&P (Signed)
 Outpatient short stay form Pre-procedure 02/24/2024 10:17 AM Omar Devine K. Corky Diener, M.D.  Primary Physician: Eddy Goodell III, M.D.  Reason for visit:  Alcoholic cirrhosis, portal hypertension, esophageal varices.  History of present illness:  Patient with alcoholic cirrhosis, ascites, and esophageal varices s/p endoscopic variceal ligation on 01/06/2024 presents for re-banding for eradication. Patient denies interval bleeding, nausea, vomiting. Underwent abdominal paracentesis for intractable ascites.   No current facility-administered medications for this encounter.  Medications Prior to Admission  Medication Sig Dispense Refill Last Dose/Taking   albuterol  (VENTOLIN  HFA) 108 (90 Base) MCG/ACT inhaler Inhale 1-2 puffs into the lungs every 6 (six) hours as needed.      cetirizine (ZYRTEC) 10 MG tablet Take 10 mg by mouth daily.      eplerenone  (INSPRA ) 25 MG tablet Take 25 mg by mouth daily.      ferrous sulfate  324 MG TBEC Take 1 tablet (324 mg total) by mouth every other day. 30 tablet 0    folic acid  (FOLVITE ) 1 MG tablet Take 1 tablet (1 mg total) by mouth daily. 90 tablet 1    furosemide  (LASIX ) 20 MG tablet Take 1 tablet (20 mg total) by mouth daily. 30 tablet 2    Multiple Vitamin (MULTIVITAMIN WITH MINERALS) TABS tablet Take 1 tablet by mouth daily. 90 tablet 1    nadolol  (CORGARD ) 20 MG tablet Take 0.5 tablets (10 mg total) by mouth daily. 45 tablet 1    naltrexone (DEPADE) 50 MG tablet Take 1 tablet by mouth daily.      nicotine  (NICODERM CQ  - DOSED IN MG/24 HOURS) 21 mg/24hr patch Place 1 patch (21 mg total) onto the skin daily. 28 patch 0    ondansetron  (ZOFRAN -ODT) 4 MG disintegrating tablet Take 1 tablet (4 mg total) by mouth every 8 (eight) hours as needed for nausea or vomiting. 20 tablet 0    pantoprazole  (PROTONIX ) 40 MG tablet Take 1 tablet (40 mg total) by mouth daily. 30 tablet 0    potassium chloride  (KLOR-CON ) 10 MEQ tablet Take 10 mEq by mouth daily.      predniSONE   (DELTASONE ) 20 MG tablet Take 20 mg by mouth 2 (two) times daily.      thiamine  (VITAMIN B-1) 100 MG tablet Take 1 tablet (100 mg total) by mouth daily. 90 tablet 1      Allergies  Allergen Reactions   Chantix [Varenicline]    Lexapro [Escitalopram]           Past Medical History:  Diagnosis Date   Anginal pain (HCC)    Anxiety    Ascites    Basal cell carcinoma 11/26/2022   Superficial, left spinal upper back, clear with biopsy   Basal cell carcinoma 11/26/2022   L post neck, clear with biopsy   Basal cell carcinoma 05/29/2021   anterior neck - R anterolateral, exc at [facility]   Basal cell carcinoma 06/26/2021   L med scapular back, exc at [facility] cell carcinoma 06/12/2021   L post forearm, [facility]   Basal cell carcinoma 06/12/2021   R scapular back, [facility]   Basal cell carcinoma 06/12/2021   Central scapular back   Basal cell carcinoma 12/04/2022   Left Upper Arm - Anterior, [facility]   Basal cell carcinoma 12/04/2022   L upper chest inferior, [facility]   Basal cell carcinoma 12/04/2022   left upper chest lateral, [facility]   Basal cell carcinoma 12/04/2022   left upper chest medial, [facility]  Basal cell carcinoma 12/30/2022   R post neck, [facility]   Basal cell carcinoma 04/01/2023   left mid chest, [facility]   Cirrhosis (HCC)    Degenerative disc disease, cervical    Diverticulitis large intestine    dysplastic nevus 11/26/2022   R spinal mid back, shave removal 12/30/22   GERD (gastroesophageal reflux disease)    History of chest pain    Hyperlipidemia    Hypertension    Sleep apnea    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    Physical Exam  Gen: Alert, oriented. Appears stated age.  HEENT: [place]/AT. PERRLA. Lungs: CTA, no wheezes. CV: RR nl S1, S2. Abd: soft, benign, no  masses. BS+ Ext: No edema. Pulses 2+    Planned procedures: Proceed with EGD. The patient understands the nature of the planned procedure, indications, risks, alternatives and potential complications including but not limited to bleeding, infection, perforation, damage to internal organs and possible oversedation/side effects from anesthesia. The patient agrees and gives consent to proceed.  Please refer to procedure notes for findings, recommendations and patient disposition/instructions.     Omar Lemire K. Corky Diener, M.D. Gastroenterology 02/24/2024  10:17 AM

## 2024-02-24 NOTE — Anesthesia Procedure Notes (Signed)
 Date/Time: 02/24/2024 11:57 AM  Performed by: Racheal Buddle, CRNAPre-anesthesia Checklist: Patient identified, Emergency Drugs available, Suction available and Patient being monitored Patient Re-evaluated:Patient Re-evaluated prior to induction Oxygen Delivery Method: Nasal cannula Induction Type: IV induction Dental Injury: Teeth and Oropharynx as per pre-operative assessment  Comments: Nasal cannula with etCO2 monitoring

## 2024-02-24 NOTE — Transfer of Care (Signed)
 Immediate Anesthesia Transfer of Care Note  Patient: Omar Turner  Procedure(s) Performed: Procedure(s): EGD (ESOPHAGOGASTRODUODENOSCOPY) (N/A)  Patient Location: PACU and Endoscopy Unit  Anesthesia Type:General  Level of Consciousness: sedated  Airway & Oxygen Therapy: Patient Spontanous Breathing and Patient connected to nasal cannula oxygen  Post-op Assessment: Report given to RN and Post -op Vital signs reviewed and stable  Post vital signs: Reviewed and stable  Last Vitals:  Vitals:   02/24/24 1044 02/24/24 1206  BP: 108/73 93/62  Pulse: 72 71  Resp: 16 16  Temp: (!) 36.1 C (!) 36.1 C  SpO2: 100% 97%    Complications: No apparent anesthesia complications

## 2024-02-24 NOTE — Interval H&P Note (Signed)
 History and Physical Interval Note:  02/24/2024 11:18 AM  Omar Turner  has presented today for surgery, with the diagnosis of Alcoholic cirrhosis of liver with ascites (CMS/HHS-HCC) (K70.31) Hematochezia (K92.1) Esophageal varices in alcoholic cirrhosis (CMS/HHS-HCC) (K70.30,I85.10) Portal hypertensive gastropathy  (CMS/HHS-HCC) (K76.6,K31.89) ETOH abuse (F10.10).  The various methods of treatment have been discussed with the patient and family. After consideration of risks, benefits and other options for treatment, the patient has consented to  Procedure(s): EGD (ESOPHAGOGASTRODUODENOSCOPY) (N/A) as a surgical intervention.  The patient's history has been reviewed, patient examined, no change in status, stable for surgery.  I have reviewed the patient's chart and labs.  Questions were answered to the patient's satisfaction.     [place], [name]

## 2024-02-25 ENCOUNTER — Encounter: Payer: Self-pay | Admitting: Internal Medicine

## 2024-02-25 ENCOUNTER — Ambulatory Visit
Admission: RE | Admit: 2024-02-25 | Discharge: 2024-02-25 | Disposition: A | Discharge status: Home / Self Care | Source: Ambulatory Visit | Attending: Gastroenterology | Admitting: Gastroenterology

## 2024-02-25 DIAGNOSIS — K7031 Alcoholic cirrhosis of liver with ascites: Secondary | ICD-10-CM | POA: Diagnosis present

## 2024-02-25 MED ORDER — LIDOCAINE HCL (PF) 1 % IJ SOLN
10.0000 mL | Freq: Once | INTRAMUSCULAR | Status: AC
  Administered 2024-02-25: 10 mL via INTRADERMAL
  Filled 2024-02-25: qty 10

## 2024-02-25 MED ORDER — ALBUMIN HUMAN 25 % IV SOLN
INTRAVENOUS | Status: AC
  Filled 2024-02-25: qty 100

## 2024-02-25 MED ORDER — ALBUMIN HUMAN 25 % IV SOLN
25.0000 g | Freq: Once | INTRAVENOUS | Status: AC
  Administered 2024-02-25: 25 g via INTRAVENOUS

## 2024-02-25 NOTE — Procedures (Signed)
 PROCEDURE SUMMARY:  Successful US  guided paracentesis from right lateral abdomen.  Yielded 2.3 liters of clear yellow fluid.  No immediate complications.  Patient tolerated well.  EBL = trace  Bich Mchaney Alonzo Artis PA-C 02/25/2024 2:10 PM

## 2024-03-03 ENCOUNTER — Ambulatory Visit
Admission: RE | Admit: 2024-03-03 | Discharge: 2024-03-03 | Disposition: A | Discharge status: Home / Self Care | Source: Ambulatory Visit | Attending: Gastroenterology | Admitting: Gastroenterology

## 2024-03-03 ENCOUNTER — Other Ambulatory Visit: Payer: Self-pay | Admitting: Gastroenterology

## 2024-03-03 DIAGNOSIS — K7031 Alcoholic cirrhosis of liver with ascites: Secondary | ICD-10-CM | POA: Diagnosis present

## 2024-03-10 ENCOUNTER — Ambulatory Visit

## 2024-03-11 ENCOUNTER — Ambulatory Visit
Admission: RE | Admit: 2024-03-11 | Discharge: 2024-03-11 | Disposition: A | Discharge status: Home / Self Care | Source: Ambulatory Visit | Attending: Gastroenterology | Admitting: Gastroenterology

## 2024-03-11 ENCOUNTER — Other Ambulatory Visit: Payer: Self-pay | Admitting: Gastroenterology

## 2024-03-11 DIAGNOSIS — K7031 Alcoholic cirrhosis of liver with ascites: Secondary | ICD-10-CM

## 2024-03-11 NOTE — Procedures (Signed)
 Patient presented to [facility] today for paracentesis.  Limited US  Abdomen shows no fluid amenable to paracentesis. No procedure performed.   Ahilyn Nell, MS RD PA-C

## 2024-03-17 ENCOUNTER — Ambulatory Visit
Admission: RE | Admit: 2024-03-17 | Discharge: 2024-03-17 | Disposition: A | Discharge status: Home / Self Care | Source: Ambulatory Visit | Attending: Gastroenterology | Admitting: Gastroenterology

## 2024-03-17 ENCOUNTER — Other Ambulatory Visit: Payer: Self-pay | Admitting: Gastroenterology

## 2024-03-17 DIAGNOSIS — K7031 Alcoholic cirrhosis of liver with ascites: Secondary | ICD-10-CM | POA: Insufficient documentation

## 2024-03-17 NOTE — Procedures (Signed)
 Patient presents for therapeutic paracentesis. I was present during limited US  of the abdomen to evaluate for ascites. There is minimal peritoneal fluid with numerous mobile loops of bowel. Insufficient pocket of fluid to safely perform a paracentesis.   After discussion of the risks versus the benefits of the procedure the patient elected to defer the paracentesis at this time.  Procedure not performed.  Electronically Signed: Arabell Neria M Lamonta Cypress, PA-C 03/17/2024, 8:47 AM

## 2024-03-24 ENCOUNTER — Ambulatory Visit

## 2024-04-07 ENCOUNTER — Ambulatory Visit
Admission: RE | Admit: 2024-04-07 | Discharge: 2024-04-07 | Disposition: A | Discharge status: Home / Self Care | Source: Ambulatory Visit | Attending: Gastroenterology | Admitting: Gastroenterology

## 2024-04-07 ENCOUNTER — Other Ambulatory Visit: Payer: Self-pay | Admitting: Gastroenterology

## 2024-04-07 DIAGNOSIS — K7031 Alcoholic cirrhosis of liver with ascites: Secondary | ICD-10-CM

## 2024-04-07 NOTE — Progress Notes (Signed)
 Patient presented to [facility] US  today for possible paracentesis.  Limited US  Abdomen shows no fluid amenable to paracentesis today.  No procedure performed.  Patient discharged home.   Jaxsen Bernhart, MS RD PA-C

## 2024-04-27 ENCOUNTER — Other Ambulatory Visit: Payer: Self-pay | Admitting: Gastroenterology

## 2024-04-27 DIAGNOSIS — K769 Liver disease, unspecified: Secondary | ICD-10-CM

## 2024-04-27 DIAGNOSIS — K7031 Alcoholic cirrhosis of liver with ascites: Secondary | ICD-10-CM

## 2024-04-27 DIAGNOSIS — K3189 Other diseases of stomach and duodenum: Secondary | ICD-10-CM

## 2024-04-27 DIAGNOSIS — I8510 Secondary esophageal varices without bleeding: Secondary | ICD-10-CM

## 2024-04-27 DIAGNOSIS — K7682 Hepatic encephalopathy: Secondary | ICD-10-CM

## 2024-04-27 DIAGNOSIS — Z8719 Personal history of other diseases of the digestive system: Secondary | ICD-10-CM

## 2024-05-18 ENCOUNTER — Other Ambulatory Visit: Payer: Self-pay | Admitting: Gastroenterology

## 2024-05-18 DIAGNOSIS — K7031 Alcoholic cirrhosis of liver with ascites: Secondary | ICD-10-CM

## 2024-05-19 ENCOUNTER — Ambulatory Visit
Admission: RE | Admit: 2024-05-19 | Discharge: 2024-05-19 | Disposition: A | Discharge status: Home / Self Care | Source: Ambulatory Visit | Attending: Gastroenterology | Admitting: Gastroenterology

## 2024-05-19 DIAGNOSIS — K7031 Alcoholic cirrhosis of liver with ascites: Secondary | ICD-10-CM | POA: Diagnosis present

## 2024-05-19 MED ORDER — ALBUMIN HUMAN 25 % IV SOLN
25.0000 g | Freq: Once | INTRAVENOUS | Status: AC
  Administered 2024-05-19 (×2): 25 g via INTRAVENOUS

## 2024-05-19 MED ORDER — ALBUMIN HUMAN 25 % IV SOLN
INTRAVENOUS | Status: AC
  Filled 2024-05-19: qty 100

## 2024-05-19 MED ORDER — LIDOCAINE HCL (PF) 1 % IJ SOLN
10.0000 mL | Freq: Once | INTRAMUSCULAR | Status: AC
  Administered 2024-05-19 (×2): 10 mL via INTRADERMAL

## 2024-05-19 NOTE — Procedures (Signed)
 PROCEDURE SUMMARY:  Successful image-guided paracentesis from the right abdomen.  Yielded 1.6 liters of yellow fluid.  No immediate complications.  EBL: zero Patient tolerated well.   Specimen not sent for labs.  Please see imaging section of Epic for full dictation.  Jacarie Pate NP 05/19/2024 2:08 PM

## 2024-05-20 ENCOUNTER — Ambulatory Visit
Admission: RE | Admit: 2024-05-20 | Discharge: 2024-05-20 | Disposition: A | Discharge status: Home / Self Care | Source: Ambulatory Visit | Attending: Gastroenterology | Admitting: Gastroenterology

## 2024-05-20 DIAGNOSIS — K7031 Alcoholic cirrhosis of liver with ascites: Secondary | ICD-10-CM | POA: Insufficient documentation

## 2024-05-20 DIAGNOSIS — K7682 Hepatic encephalopathy: Secondary | ICD-10-CM | POA: Diagnosis present

## 2024-05-20 DIAGNOSIS — K3189 Other diseases of stomach and duodenum: Secondary | ICD-10-CM | POA: Diagnosis present

## 2024-05-20 DIAGNOSIS — K7030 Alcoholic cirrhosis of liver without ascites: Secondary | ICD-10-CM | POA: Insufficient documentation

## 2024-05-20 DIAGNOSIS — K769 Liver disease, unspecified: Secondary | ICD-10-CM | POA: Diagnosis present

## 2024-05-20 DIAGNOSIS — K766 Portal hypertension: Secondary | ICD-10-CM | POA: Insufficient documentation

## 2024-05-20 DIAGNOSIS — Z8719 Personal history of other diseases of the digestive system: Secondary | ICD-10-CM | POA: Diagnosis present

## 2024-05-20 DIAGNOSIS — I8510 Secondary esophageal varices without bleeding: Secondary | ICD-10-CM | POA: Diagnosis present

## 2024-05-20 MED ORDER — GADOBUTROL 1 MMOL/ML IV SOLN
10.0000 mL | Freq: Once | INTRAVENOUS | Status: AC | PRN
  Administered 2024-05-20: 10 mL via INTRAVENOUS

## 2024-05-21 ENCOUNTER — Encounter: Payer: Self-pay | Admitting: Internal Medicine

## 2024-05-26 ENCOUNTER — Other Ambulatory Visit: Payer: Self-pay | Admitting: Gastroenterology

## 2024-05-26 DIAGNOSIS — K7031 Alcoholic cirrhosis of liver with ascites: Secondary | ICD-10-CM

## 2024-05-29 ENCOUNTER — Emergency Department (HOSPITAL_COMMUNITY)

## 2024-05-29 ENCOUNTER — Encounter (HOSPITAL_COMMUNITY): Payer: Self-pay

## 2024-05-29 ENCOUNTER — Inpatient Hospital Stay (HOSPITAL_COMMUNITY)
Admission: EM | Admit: 2024-05-29 | Discharge: 2024-06-08 | DRG: 870 | Disposition: A | Discharge status: Hospice | Attending: Internal Medicine | Admitting: Internal Medicine

## 2024-05-29 DIAGNOSIS — E785 Hyperlipidemia, unspecified: Secondary | ICD-10-CM | POA: Diagnosis present

## 2024-05-29 DIAGNOSIS — Z8669 Personal history of other diseases of the nervous system and sense organs: Secondary | ICD-10-CM

## 2024-05-29 DIAGNOSIS — Z85828 Personal history of other malignant neoplasm of skin: Secondary | ICD-10-CM

## 2024-05-29 DIAGNOSIS — D6959 Other secondary thrombocytopenia: Secondary | ICD-10-CM | POA: Diagnosis present

## 2024-05-29 DIAGNOSIS — K3184 Gastroparesis: Secondary | ICD-10-CM | POA: Diagnosis not present

## 2024-05-29 DIAGNOSIS — K7011 Alcoholic hepatitis with ascites: Secondary | ICD-10-CM | POA: Diagnosis present

## 2024-05-29 DIAGNOSIS — F1010 Alcohol abuse, uncomplicated: Secondary | ICD-10-CM | POA: Diagnosis present

## 2024-05-29 DIAGNOSIS — R571 Hypovolemic shock: Secondary | ICD-10-CM | POA: Diagnosis present

## 2024-05-29 DIAGNOSIS — Z515 Encounter for palliative care: Secondary | ICD-10-CM

## 2024-05-29 DIAGNOSIS — K649 Unspecified hemorrhoids: Secondary | ICD-10-CM | POA: Diagnosis present

## 2024-05-29 DIAGNOSIS — J690 Pneumonitis due to inhalation of food and vomit: Secondary | ICD-10-CM | POA: Diagnosis present

## 2024-05-29 DIAGNOSIS — K766 Portal hypertension: Secondary | ICD-10-CM | POA: Diagnosis present

## 2024-05-29 DIAGNOSIS — K7010 Alcoholic hepatitis without ascites: Secondary | ICD-10-CM

## 2024-05-29 DIAGNOSIS — I700 Atherosclerosis of aorta: Secondary | ICD-10-CM | POA: Diagnosis present

## 2024-05-29 DIAGNOSIS — I10 Essential (primary) hypertension: Secondary | ICD-10-CM | POA: Diagnosis present

## 2024-05-29 DIAGNOSIS — I959 Hypotension, unspecified: Secondary | ICD-10-CM | POA: Diagnosis present

## 2024-05-29 DIAGNOSIS — Z91128 Patient's intentional underdosing of medication regimen for other reason: Secondary | ICD-10-CM

## 2024-05-29 DIAGNOSIS — J9601 Acute respiratory failure with hypoxia: Secondary | ICD-10-CM

## 2024-05-29 DIAGNOSIS — G40501 Epileptic seizures related to external causes, not intractable, with status epilepticus: Secondary | ICD-10-CM | POA: Diagnosis present

## 2024-05-29 DIAGNOSIS — G9341 Metabolic encephalopathy: Secondary | ICD-10-CM | POA: Diagnosis not present

## 2024-05-29 DIAGNOSIS — Z8249 Family history of ischemic heart disease and other diseases of the circulatory system: Secondary | ICD-10-CM

## 2024-05-29 DIAGNOSIS — Z7189 Other specified counseling: Secondary | ICD-10-CM

## 2024-05-29 DIAGNOSIS — K3189 Other diseases of stomach and duodenum: Secondary | ICD-10-CM | POA: Diagnosis present

## 2024-05-29 DIAGNOSIS — R6521 Severe sepsis with septic shock: Secondary | ICD-10-CM | POA: Diagnosis not present

## 2024-05-29 DIAGNOSIS — N179 Acute kidney failure, unspecified: Secondary | ICD-10-CM

## 2024-05-29 DIAGNOSIS — J80 Acute respiratory distress syndrome: Secondary | ICD-10-CM | POA: Diagnosis present

## 2024-05-29 DIAGNOSIS — F10139 Alcohol abuse with withdrawal, unspecified: Secondary | ICD-10-CM | POA: Diagnosis not present

## 2024-05-29 DIAGNOSIS — J948 Other specified pleural conditions: Secondary | ICD-10-CM | POA: Diagnosis present

## 2024-05-29 DIAGNOSIS — K7682 Hepatic encephalopathy: Secondary | ICD-10-CM | POA: Diagnosis present

## 2024-05-29 DIAGNOSIS — J189 Pneumonia, unspecified organism: Secondary | ICD-10-CM

## 2024-05-29 DIAGNOSIS — K449 Diaphragmatic hernia without obstruction or gangrene: Secondary | ICD-10-CM | POA: Diagnosis present

## 2024-05-29 DIAGNOSIS — R6520 Severe sepsis without septic shock: Principal | ICD-10-CM | POA: Diagnosis present

## 2024-05-29 DIAGNOSIS — A419 Sepsis, unspecified organism: Secondary | ICD-10-CM | POA: Diagnosis not present

## 2024-05-29 DIAGNOSIS — I9589 Other hypotension: Secondary | ICD-10-CM | POA: Diagnosis present

## 2024-05-29 DIAGNOSIS — D696 Thrombocytopenia, unspecified: Secondary | ICD-10-CM | POA: Diagnosis present

## 2024-05-29 DIAGNOSIS — K7031 Alcoholic cirrhosis of liver with ascites: Secondary | ICD-10-CM | POA: Diagnosis present

## 2024-05-29 DIAGNOSIS — Z86008 Personal history of in-situ neoplasm of other site: Secondary | ICD-10-CM

## 2024-05-29 DIAGNOSIS — F17210 Nicotine dependence, cigarettes, uncomplicated: Secondary | ICD-10-CM | POA: Diagnosis present

## 2024-05-29 DIAGNOSIS — N390 Urinary tract infection, site not specified: Secondary | ICD-10-CM | POA: Diagnosis present

## 2024-05-29 DIAGNOSIS — F419 Anxiety disorder, unspecified: Secondary | ICD-10-CM | POA: Diagnosis present

## 2024-05-29 DIAGNOSIS — N170 Acute kidney failure with tubular necrosis: Secondary | ICD-10-CM | POA: Diagnosis present

## 2024-05-29 DIAGNOSIS — I480 Paroxysmal atrial fibrillation: Secondary | ICD-10-CM | POA: Diagnosis present

## 2024-05-29 DIAGNOSIS — Z66 Do not resuscitate: Secondary | ICD-10-CM | POA: Diagnosis not present

## 2024-05-29 DIAGNOSIS — E871 Hypo-osmolality and hyponatremia: Secondary | ICD-10-CM | POA: Diagnosis present

## 2024-05-29 DIAGNOSIS — J159 Unspecified bacterial pneumonia: Secondary | ICD-10-CM | POA: Diagnosis present

## 2024-05-29 DIAGNOSIS — K7460 Unspecified cirrhosis of liver: Secondary | ICD-10-CM

## 2024-05-29 DIAGNOSIS — E876 Hypokalemia: Secondary | ICD-10-CM | POA: Diagnosis not present

## 2024-05-29 DIAGNOSIS — K219 Gastro-esophageal reflux disease without esophagitis: Secondary | ICD-10-CM | POA: Diagnosis present

## 2024-05-29 DIAGNOSIS — I2510 Atherosclerotic heart disease of native coronary artery without angina pectoris: Secondary | ICD-10-CM | POA: Diagnosis present

## 2024-05-29 DIAGNOSIS — K2100 Gastro-esophageal reflux disease with esophagitis, without bleeding: Secondary | ICD-10-CM | POA: Diagnosis present

## 2024-05-29 DIAGNOSIS — E8809 Other disorders of plasma-protein metabolism, not elsewhere classified: Secondary | ICD-10-CM | POA: Diagnosis present

## 2024-05-29 DIAGNOSIS — K5730 Diverticulosis of large intestine without perforation or abscess without bleeding: Secondary | ICD-10-CM | POA: Diagnosis present

## 2024-05-29 DIAGNOSIS — J440 Chronic obstructive pulmonary disease with acute lower respiratory infection: Secondary | ICD-10-CM | POA: Diagnosis present

## 2024-05-29 DIAGNOSIS — E861 Hypovolemia: Secondary | ICD-10-CM | POA: Diagnosis present

## 2024-05-29 DIAGNOSIS — I8510 Secondary esophageal varices without bleeding: Secondary | ICD-10-CM | POA: Diagnosis present

## 2024-05-29 DIAGNOSIS — R739 Hyperglycemia, unspecified: Secondary | ICD-10-CM | POA: Diagnosis not present

## 2024-05-29 DIAGNOSIS — K7210 Chronic hepatic failure without coma: Secondary | ICD-10-CM | POA: Diagnosis present

## 2024-05-29 DIAGNOSIS — E872 Acidosis, unspecified: Secondary | ICD-10-CM | POA: Diagnosis present

## 2024-05-29 DIAGNOSIS — F05 Delirium due to known physiological condition: Secondary | ICD-10-CM | POA: Diagnosis not present

## 2024-05-29 DIAGNOSIS — K7290 Hepatic failure, unspecified without coma: Secondary | ICD-10-CM

## 2024-05-29 DIAGNOSIS — I471 Supraventricular tachycardia, unspecified: Secondary | ICD-10-CM | POA: Diagnosis present

## 2024-05-29 DIAGNOSIS — E870 Hyperosmolality and hypernatremia: Secondary | ICD-10-CM | POA: Diagnosis not present

## 2024-05-29 DIAGNOSIS — Z79899 Other long term (current) drug therapy: Secondary | ICD-10-CM

## 2024-05-29 DIAGNOSIS — K529 Noninfective gastroenteritis and colitis, unspecified: Secondary | ICD-10-CM | POA: Diagnosis present

## 2024-05-29 DIAGNOSIS — D689 Coagulation defect, unspecified: Secondary | ICD-10-CM | POA: Diagnosis present

## 2024-05-29 LAB — COMPREHENSIVE METABOLIC PANEL WITH GFR
ALT: 106 U/L — ABNORMAL HIGH (ref 0–44)
AST: 326 U/L — ABNORMAL HIGH (ref 15–41)
Albumin: 2.1 g/dL — ABNORMAL LOW (ref 3.5–5.0)
Alkaline Phosphatase: 164 U/L — ABNORMAL HIGH (ref 38–126)
Anion gap: 22 — ABNORMAL HIGH (ref 5–15)
BUN: 11 mg/dL (ref 6–20)
CO2: 17 mmol/L — ABNORMAL LOW (ref 22–32)
Calcium: 8 mg/dL — ABNORMAL LOW (ref 8.9–10.3)
Chloride: 88 mmol/L — ABNORMAL LOW (ref 98–111)
Creatinine, Ser: 1.6 mg/dL — ABNORMAL HIGH (ref 0.61–1.24)
GFR, Estimated: 52 mL/min — ABNORMAL LOW (ref >60)
Glucose, Bld: 77 mg/dL (ref 70–99)
Potassium: 3.3 mmol/L — ABNORMAL LOW (ref 3.5–5.1)
Sodium: 127 mmol/L — ABNORMAL LOW (ref 135–145)
Total Bilirubin: 19.7 mg/dL (ref 0.0–1.2)
Total Protein: 7.3 g/dL (ref 6.5–8.1)

## 2024-05-29 LAB — CBC WITH DIFFERENTIAL/PLATELET
Abs Granulocyte: 7.5 K/uL — ABNORMAL HIGH (ref 1.5–6.5)
Abs Immature Granulocytes: 0.07 K/uL (ref 0.00–0.07)
Basophils Absolute: 0 K/uL (ref 0.0–0.1)
Basophils Relative: 0 %
Eosinophils Absolute: 0 K/uL (ref 0.0–0.5)
Eosinophils Relative: 0 %
HCT: 37.3 % — ABNORMAL LOW (ref 39.0–52.0)
Hemoglobin: 13.2 g/dL (ref 13.0–17.0)
Immature Granulocytes: 1 %
Lymphocytes Relative: 10 %
Lymphs Abs: 1 K/uL (ref 0.7–4.0)
MCH: 35.1 pg — ABNORMAL HIGH (ref 26.0–34.0)
MCHC: 35.4 g/dL (ref 30.0–36.0)
MCV: 99.2 fL (ref 80.0–100.0)
Monocytes Absolute: 1.1 K/uL — ABNORMAL HIGH (ref 0.1–1.0)
Monocytes Relative: 11 %
Neutro Abs: 7.5 K/uL (ref 1.7–7.7)
Neutrophils Relative %: 78 %
Platelets: 57 K/uL — ABNORMAL LOW (ref 150–400)
RBC: 3.76 MIL/uL — ABNORMAL LOW (ref 4.22–5.81)
RDW: 17 % — ABNORMAL HIGH (ref 11.5–15.5)
Smear Review: NORMAL
WBC: 9.7 K/uL (ref 4.0–10.5)
nRBC: 0 % (ref 0.0–0.2)

## 2024-05-29 LAB — RESP PANEL BY RT-PCR (RSV, FLU A&B, COVID)  RVPGX2
Influenza A by PCR: NEGATIVE
Influenza B by PCR: NEGATIVE
Resp Syncytial Virus by PCR: NEGATIVE
SARS Coronavirus 2 by RT PCR: NEGATIVE

## 2024-05-29 LAB — AMMONIA: Ammonia: 29 umol/L (ref 9–35)

## 2024-05-29 LAB — I-STAT VENOUS BLOOD GAS, ED
Acid-base deficit: 1 mmol/L (ref 0.0–2.0)
Bicarbonate: 22.3 mmol/L (ref 20.0–28.0)
Calcium, Ion: 0.91 mmol/L — ABNORMAL LOW (ref 1.15–1.40)
HCT: 40 % (ref 39.0–52.0)
Hemoglobin: 13.6 g/dL (ref 13.0–17.0)
O2 Saturation: 65 %
Potassium: 3.3 mmol/L — ABNORMAL LOW (ref 3.5–5.1)
Sodium: 130 mmol/L — ABNORMAL LOW (ref 135–145)
TCO2: 23 mmol/L (ref 22–32)
pCO2, Ven: 33.4 mmHg — ABNORMAL LOW (ref 44–60)
pH, Ven: 7.432 — ABNORMAL HIGH (ref 7.25–7.43)
pO2, Ven: 32 mmHg (ref 32–45)

## 2024-05-29 LAB — PROTIME-INR
INR: 2.1 — ABNORMAL HIGH (ref 0.8–1.2)
Prothrombin Time: 25 s — ABNORMAL HIGH (ref 11.4–15.2)

## 2024-05-29 LAB — LIPASE, BLOOD: Lipase: 57 U/L — ABNORMAL HIGH (ref 11–51)

## 2024-05-29 LAB — I-STAT CG4 LACTIC ACID, ED
Lactic Acid, Venous: 7.9 mmol/L (ref 0.5–1.9)
Lactic Acid, Venous: 7.8 mmol/L (ref 0.5–1.9)

## 2024-05-29 LAB — PROTEIN, PLEURAL OR PERITONEAL FLUID: Total protein, fluid: <3 g/dL

## 2024-05-29 LAB — GLUCOSE, PLEURAL OR PERITONEAL FLUID: Glucose, Fluid: 89 mg/dL

## 2024-05-29 LAB — LACTATE DEHYDROGENASE, PLEURAL OR PERITONEAL FLUID: LD, Fluid: 30 U/L — ABNORMAL HIGH (ref 3–23)

## 2024-05-29 LAB — ALBUMIN, PLEURAL OR PERITONEAL FLUID: Albumin, Fluid: <1.5 g/dL

## 2024-05-29 MED ORDER — METOPROLOL TARTRATE 5 MG/5ML IV SOLN
5.0000 mg | Freq: Once | INTRAVENOUS | Status: AC
  Administered 2024-05-29: 5 mg via INTRAVENOUS
  Filled 2024-05-29: qty 5

## 2024-05-29 MED ORDER — LACTATED RINGERS IV BOLUS
1500.0000 mL | Freq: Once | INTRAVENOUS | Status: AC
  Administered 2024-05-29: 1500 mL via INTRAVENOUS

## 2024-05-29 MED ORDER — LACTATED RINGERS IV BOLUS
1000.0000 mL | Freq: Once | INTRAVENOUS | Status: AC
  Administered 2024-05-29: 1000 mL via INTRAVENOUS

## 2024-05-29 MED ORDER — MIDAZOLAM HCL 2 MG/2ML IJ SOLN
2.0000 mg | Freq: Once | INTRAMUSCULAR | Status: AC
  Administered 2024-05-29: 2 mg via INTRAVENOUS
  Filled 2024-05-29: qty 2

## 2024-05-29 MED ORDER — LACTATED RINGERS IV BOLUS (SEPSIS)
500.0000 mL | Freq: Once | INTRAVENOUS | Status: AC
  Administered 2024-05-29: 500 mL via INTRAVENOUS

## 2024-05-29 MED ORDER — ONDANSETRON HCL 4 MG/2ML IJ SOLN
4.0000 mg | Freq: Once | INTRAMUSCULAR | Status: AC
  Administered 2024-05-29: 4 mg via INTRAVENOUS
  Filled 2024-05-29: qty 2

## 2024-05-29 MED ORDER — IOHEXOL 350 MG/ML SOLN
75.0000 mL | Freq: Once | INTRAVENOUS | Status: AC | PRN
  Administered 2024-05-29: 75 mL via INTRAVENOUS

## 2024-05-29 MED ORDER — MORPHINE SULFATE (PF) 4 MG/ML IV SOLN
4.0000 mg | Freq: Once | INTRAVENOUS | Status: AC
  Administered 2024-05-29: 4 mg via INTRAVENOUS
  Filled 2024-05-29: qty 1

## 2024-05-29 MED ORDER — LACTATED RINGERS IV SOLN: INTRAVENOUS | Status: AC

## 2024-05-29 MED ORDER — SODIUM CHLORIDE 0.9 % IV SOLN
500.0000 mg | Freq: Once | INTRAVENOUS | Status: AC
  Administered 2024-05-30: 500 mg via INTRAVENOUS
  Filled 2024-05-29: qty 5

## 2024-05-29 MED ORDER — SODIUM CHLORIDE 0.9 % IV SOLN
2.0000 g | Freq: Once | INTRAVENOUS | Status: AC
  Administered 2024-05-29: 2 g via INTRAVENOUS
  Filled 2024-05-29: qty 20

## 2024-05-29 MED ORDER — NICOTINE 21 MG/24HR TD PT24
21.0000 mg | MEDICATED_PATCH | Freq: Once | TRANSDERMAL | Status: AC
  Administered 2024-05-30 (×2): 21 mg via TRANSDERMAL
  Filled 2024-05-29: qty 1

## 2024-05-29 NOTE — ED Triage Notes (Signed)
 Pt BIB [facility] from home for c/o [facility] that started a few days ago. Upon EMS arrival bp 70/40, svt 180. Pt began to lose consciousness, cardioverted 100 joules, HR down to 160. N/V/D for the past 3 days, unable to keep fluids down. Pt had MRI Thursday, told he had a mass of fluid on his lungs. Reports abdominal pain. Pt A/O x4

## 2024-05-29 NOTE — Sepsis Progress Note (Addendum)
 Elink following for sepsis protocol which was called at 22:19,  lactic collected at 18:35, blood cultures drawn at 18:30, abx's given at 19:25 and appropriate fluids given

## 2024-05-29 NOTE — ED Provider Notes (Signed)
 [place] EMERGENCY DEPARTMENT AT [facility] Provider Note   CSN: [id number] Arrival date & time: 05/29/24  8186     Patient presents with: No chief complaint on file.   Omar Turner is a 53 y.o. male.   Pt is a 53y/o male with hx of alcoholic cirrhosis, ascites, and esophageal varices s/p endoscopic variceal ligation and re-banding who for the last month has been drinking alcohol now currently about 8 drinks per day with hx of DT's and seizures and frequent paracentesis with recent MRI last week with large right sided pleural effusion who is presenting today with multiple complaints.  Patient reports for the last week he has been feeling unwell.  He has had diffuse abdominal pain, nausea, vomiting, diarrhea, poor oral intake, decreased urine output.  He reports today he just felt the worst.  He thinks he has been having fever and reports he really cannot eat or drink anything because it will either come up or come out and he cannot hold anything in.  He is also stopped taking his medications because he cannot hold them down.  He reports the abdominal pain is diffuse.  He has not had any chest pain but reports shortness of breath is gradually worsened as well.  Paramedics report that he called the ambulance today and he was sitting on the porch and when they got there they noted him to be tachycardic with low blood pressure and reported that he was appearing to syncopize and they cardioverted him as they thought that he was in SVT.  Heart rate went from 1 80-1 50s but patient has been mentating normally since that time.  They report the blood pressure continues to be low.  Patient has not noticed any swelling in his legs but reports ongoing distention of his abdomen.  Last paracentesis was last week and his next 1 was scheduled for next week.  Denies prior history of dysrhythmias.  Has not taken his blood pressure medication in a few days due to vomiting.  The history is provided by  the patient, the EMS personnel and medical records.       Prior to Admission medications   Medication Sig Start Date End Date Taking? Authorizing Provider  albuterol  (VENTOLIN  HFA) 108 (90 Base) MCG/ACT inhaler Inhale 1-2 puffs into the lungs every 6 (six) hours as needed.    [provider]  cetirizine (ZYRTEC) 10 MG tablet Take 10 mg by mouth daily.    [provider]  eplerenone  (INSPRA ) 25 MG tablet Take 25 mg by mouth daily.    [provider]  ferrous sulfate  324 MG TBEC Take 1 tablet (324 mg total) by mouth every other day. 05/11/23   Jens Durand, MD  folic acid  (FOLVITE ) 1 MG tablet Take 1 tablet (1 mg total) by mouth daily. 12/27/23   Darci Pore, MD  furosemide  (LASIX ) 20 MG tablet Take 1 tablet (20 mg total) by mouth daily. 12/26/23   Darci Pore, MD  Multiple Vitamin (MULTIVITAMIN WITH MINERALS) TABS tablet Take 1 tablet by mouth daily. 12/27/23   Darci Pore, MD  nadolol  (CORGARD ) 20 MG tablet Take 0.5 tablets (10 mg total) by mouth daily. 12/26/23   Darci Pore, MD  naltrexone (DEPADE) 50 MG tablet Take 1 tablet by mouth daily. 12/10/23 12/09/24  [provider]  nicotine  (NICODERM CQ  - DOSED IN MG/24 HOURS) 21 mg/24hr patch Place 1 patch (21 mg total) onto the skin daily. 12/27/23   Darci Pore, MD  ondansetron  (ZOFRAN -ODT) 4 MG disintegrating tablet Take 1 tablet (4 mg total) by mouth every 8 (eight) hours as needed for nausea or vomiting. 12/26/23   Darci Pore, MD  pantoprazole  (PROTONIX ) 40 MG tablet Take 1 tablet (40 mg total) by mouth daily. 05/11/23   Jens Durand, MD  potassium chloride  (KLOR-CON ) 10 MEQ tablet Take 10 mEq by mouth daily. 08/22/23   [provider]  predniSONE  (DELTASONE ) 20 MG tablet Take 20 mg by mouth 2 (two) times daily. 07/23/23   [provider]  thiamine  (VITAMIN B-1) 100 MG tablet Take 1 tablet (100 mg total) by mouth daily. 12/27/23   Darci Pore, MD    Allergies: Chantix [varenicline] and Lexapro [escitalopram]    Review of Systems  Updated Vital Signs BP 98/67   Pulse (!) 145   Temp 98.3 F (36.8 C) (Oral)   Resp 16   Wt 102.5 kg   SpO2 96%   BMI 31.52 kg/m   Physical Exam Vitals and nursing note reviewed.  Constitutional:      General: He is not in acute distress.    Appearance: He is well-developed. He is ill-appearing.  HENT:     Head: Normocephalic and atraumatic.     Mouth/Throat:     Mouth: Mucous membranes are dry.  Eyes:     General: Scleral icterus present.     Conjunctiva/sclera: Conjunctivae normal.     Pupils: Pupils are equal, round, and reactive to light.  Cardiovascular:     Rate and Rhythm: Regular rhythm. Tachycardia present.     Pulses: Normal pulses.     Heart sounds: No murmur heard.    Comments: Pulses felt bilateral DPs and radials Pulmonary:     Effort: Pulmonary effort is normal. No respiratory distress.     Breath sounds: Examination of the right-middle field reveals decreased breath sounds. Examination of the right-lower field reveals decreased breath sounds. Decreased breath sounds present. No wheezing or rales.  Abdominal:     General: There is no distension.     Palpations: Abdomen is soft.     Tenderness: There is abdominal tenderness. There is no guarding or rebound.  Musculoskeletal:        General: No tenderness. Normal range of motion.     Cervical back: Normal range of motion and neck supple.     Right lower leg: No edema.     Left lower leg: No edema.  Skin:    General: Skin is warm and dry.     Findings: No erythema or rash.     Comments: Hot to the touch  Neurological:     Mental Status: He is alert and oriented to person, place, and time. Mental status is at baseline.     Comments: No asterixis  Psychiatric:        Mood and Affect: Mood normal.        Behavior: Behavior normal.     (all labs ordered are listed, but only abnormal results are  displayed) Labs Reviewed  COMPREHENSIVE METABOLIC PANEL WITH GFR - Abnormal; Notable for the following components:      Result Value   Sodium 127 (*)    Potassium 3.3 (*)    Chloride 88 (*)    CO2 17 (*)    Creatinine, Ser 1.60 (*)    Calcium  8.0 (*)    Albumin  2.1 (*)    AST 326 (*)    ALT 106 (*)    Alkaline Phosphatase 164 (*)  Total Bilirubin 19.7 (*)    GFR, Estimated 52 (*)    Anion gap 22 (*)    All other components within normal limits  CBC WITH DIFFERENTIAL/PLATELET - Abnormal; Notable for the following components:   RBC 3.76 (*)    HCT 37.3 (*)    MCH 35.1 (*)    RDW 17.0 (*)    Platelets 57 (*)    Monocytes Absolute 1.1 (*)    Abs Granulocyte 7.5 (*)    All other components within normal limits  PROTIME-INR - Abnormal; Notable for the following components:   Prothrombin Time 25.0 (*)    INR 2.1 (*)    All other components within normal limits  LIPASE, BLOOD - Abnormal; Notable for the following components:   Lipase 57 (*)    All other components within normal limits  LACTATE DEHYDROGENASE, PLEURAL OR PERITONEAL FLUID - Abnormal; Notable for the following components:   LD, Fluid 30 (*)    All other components within normal limits  I-STAT CG4 LACTIC ACID, ED - Abnormal; Notable for the following components:   Lactic Acid, Venous 7.9 (*)    All other components within normal limits  I-STAT VENOUS BLOOD GAS, ED - Abnormal; Notable for the following components:   pH, Ven 7.432 (*)    pCO2, Ven 33.4 (*)    Sodium 130 (*)    Potassium 3.3 (*)    Calcium , Ion 0.91 (*)    All other components within normal limits  I-STAT CG4 LACTIC ACID, ED - Abnormal; Notable for the following components:   Lactic Acid, Venous 7.8 (*)    All other components within normal limits  RESP PANEL BY RT-PCR (RSV, FLU A&B, COVID)  RVPGX2  BODY FLUID CULTURE W GRAM STAIN  CULTURE, BLOOD (ROUTINE X 2)  CULTURE, BLOOD (ROUTINE X 2)  AMMONIA  GLUCOSE, PLEURAL OR PERITONEAL FLUID   PROTEIN, PLEURAL OR PERITONEAL FLUID  ALBUMIN , PLEURAL OR PERITONEAL FLUID   URINALYSIS, W/ REFLEX TO CULTURE (INFECTION SUSPECTED)    EKG: EKG Interpretation Date/Time:  Saturday May 29 2024 18:20:15 EDT Ventricular Rate:  159 PR Interval:    QRS Duration:  87 QT Interval:  319 QTC Calculation: 519 R Axis:   2  Text Interpretation: Junctional tachycardia ST depression, probably rate related Prolonged QT interval Confirmed by Doretha Folks ([id number]) on 05/29/2024 6:48:44 PM  Radiology: CT ABDOMEN PELVIS W CONTRAST Result Date: 05/29/2024 CLINICAL DATA:  Abdominal pain. EXAM: CT ABDOMEN AND PELVIS WITH CONTRAST TECHNIQUE: Multidetector CT imaging of the abdomen and pelvis was performed using the standard protocol following bolus administration of intravenous contrast. RADIATION DOSE REDUCTION: This exam was performed according to the departmental dose-optimization program which includes automated exposure control, adjustment of the mA and/or kV according to patient size and/or use of iterative reconstruction technique. CONTRAST:  75mL OMNIPAQUE  IOHEXOL  350 MG/ML SOLN COMPARISON:  December 17, 2023 FINDINGS: Lower chest: Mild to moderate severity infiltrate is seen within the left lower lobe and adjacent portion of the lingula. Moderate severity right basilar atelectasis is present. There is a large right pleural effusion. Hepatobiliary: The liver is cirrhotic in appearance and contains innumerable subcentimeter hepatic cysts a moderate amount of sludge is suspected within the lumen of a mildly distended gallbladder. There is no evidence of gallbladder wall thickening or pericholecystic inflammation. Pancreas: Unremarkable. No pancreatic ductal dilatation or surrounding inflammatory changes. Spleen: Normal in size without focal abnormality. Adrenals/Urinary Tract: Adrenal glands are unremarkable. Kidneys are normal, without renal calculi, focal  lesion, or hydronephrosis. Bladder is unremarkable.  Stomach/Bowel: Stomach is within normal limits. Appendix appears normal. No evidence of bowel dilatation. The length of the colon is markedly thickened and inflamed. Vascular/Lymphatic: Aortic atherosclerosis. No enlarged abdominal or pelvic lymph nodes. Reproductive: Prostate is unremarkable. Other: A small, fluid-filled umbilical hernia is noted. A moderate amount of abdominopelvic ascites is present. Musculoskeletal: No acute or significant osseous findings. IMPRESSION: 1. Mild to moderate severity left lower lobe and adjacent lingular infiltrate. 2. Large right pleural effusion. 3. Cirrhotic liver with innumerable subcentimeter hepatic cysts. 4. Moderate amount of abdominopelvic ascites. 5. Markedly thickened and inflamed colon, consistent with colitis. 6. Aortic atherosclerosis. Electronically Signed   By: Suzen Dials M.D.   On: 05/29/2024 22:18   DG Chest Port 1 View Result Date: 05/29/2024 CLINICAL DATA:  SVT. EXAM: PORTABLE CHEST 1 VIEW COMPARISON:  December 17, 2023 FINDINGS: The heart size and mediastinal contours are within normal limits. Marked severity atelectasis and/or infiltrate is seen within the right lung base. Mild to moderate severity mid left lung infiltrate is also noted. A large right pleural effusion is present. No pneumothorax is identified. The visualized skeletal structures are unremarkable. IMPRESSION: 1. Marked severity right basilar atelectasis and/or infiltrate. 2. Mild to moderate severity mid left lung infiltrate. 3. Large right pleural effusion. Electronically Signed   By: Suzen Dials M.D.   On: 05/29/2024 22:11     Procedures   Medications Ordered in the ED  lactated ringers  infusion ( Intravenous New Bag/Given 05/29/24 2153)  azithromycin  (ZITHROMAX ) 500 mg in sodium chloride  0.9 % 250 mL IVPB (has no administration in time range)  nicotine  (NICODERM CQ  - dosed in mg/24 hours) patch 21 mg (has no administration in time range)  lactated ringers  bolus 500 mL (0  mLs Intravenous Stopped 05/29/24 1949)  metoprolol  tartrate (LOPRESSOR ) injection 5 mg (5 mg Intravenous Given 05/29/24 1833)  lactated ringers  bolus 1,000 mL (0 mLs Intravenous Stopped 05/29/24 2010)  morphine  (PF) 4 MG/ML injection 4 mg (4 mg Intravenous Given 05/29/24 1935)  ondansetron  (ZOFRAN ) injection 4 mg (4 mg Intravenous Given 05/29/24 1935)  lactated ringers  bolus 1,500 mL (0 mLs Intravenous Stopped 05/29/24 2150)  cefTRIAXone  (ROCEPHIN ) 2 g in sodium chloride  0.9 % 100 mL IVPB (0 g Intravenous Stopped 05/29/24 2005)  midazolam  (VERSED ) injection 2 mg (2 mg Intravenous Given 05/29/24 2057)  iohexol  (OMNIPAQUE ) 350 MG/ML injection 75 mL (75 mLs Intravenous Contrast Given 05/29/24 2201)                                    Medical Decision Making Amount and/or Complexity of Data Reviewed Independent Historian: EMS External Data Reviewed: notes. Labs: ordered. Decision-making details documented in ED Course. Radiology: ordered and independent interpretation performed. Decision-making details documented in ED Course. ECG/medicine tests: ordered and independent interpretation performed. Decision-making details documented in ED Course.  Risk OTC drugs. Prescription drug management. Decision regarding hospitalization.   Pt with multiple medical problems and comorbidities and presenting today with a complaint that caries a high risk for morbidity and mortality.  Here today with the above symptoms.  Concern for sepsis as patient feels hot to the touch has diffuse abdominal pain and concern for possible SBP, pneumonia, UTI, dehydration, AKI.  Patient's blood pressure here is 114/93.  Heart rate remains in the 150s.  Patient is on continuous cardiac monitoring and rate is fast and narrow.  No evidence of V.  tach at this time.  Question SVT versus atrial flutter.  Patient given beta-blocker as he has not had his.  Also started on IV fluids due to concern for dehydration.  Sepsis labs initiated.  I  dependently interpreted patient's EKG which shows a narrow tachycardic rhythm and patient does have prior history of atrial fibrillation while being ill.  I independently interpreted patient's labs and lactic acid is elevated at 7.9 patient will be given 30/kg of fluid.  Peritoneal fluid with protein of less than 3, normal glucose, LDH of 30 which is similar to prior with no white blood cells or organisms seen with lower suspicion for SBP at this time.  Lipase is unchanged at 50, VBG without significant pH variation, ammonia is normal, viral swab is negative for COVID and flu, CMP with hyponatremia today of 127, new AKI with creatinine of 1.6 from his baseline of 0.8, elevated LFTs and an anion gap of 22, CBC with normal white count and stable hemoglobin of 13, platelet count of 54 with elevated INR of 2.1 which is similar to prior and total bilirubin of 19.7.  I have independently visualized and interpreted pt's images today.  Chest x-ray with significant right pleural effusion and haziness in the left lobe, radiology reports atelectasis and possible infiltrate as well as mild to moderate severity infiltrate in the left lung and a large right pleural effusion.  CT shows a cirrhotic liver with significant ascites and inflammation of the colon concerning for colitis.  Radiology also reports mild to moderate severity left lower lobe adjacent lingular infiltrate.  Patient's blood pressure remains in the upper 90s heart rates remain in the 140s, lactate has not changed.  He continues to [employer] normally.  He was covered with Rocephin  and azithromycin .  Blood cultures are pending.  Given patient's unchanged lactic acid persistent tachycardia which may be related to A- flutter we will discuss the case with critical care.  Patient did start having symptoms of feeling shaky and received 2 of Versed  with improvement of symptoms. CRITICAL CARE Performed by: Annalei Friesz Total critical care time: 45 minutes Critical  care time was exclusive of separately billable procedures and treating other patients. Critical care was necessary to treat or prevent imminent or life-threatening deterioration. Critical care was time spent personally by me on the following activities: development of treatment plan with patient and/or surrogate as well as nursing, discussions with consultants, evaluation of patient's response to treatment, examination of patient, obtaining history from patient or surrogate, ordering and performing treatments and interventions, ordering and review of laboratory studies, ordering and review of radiographic studies, pulse oximetry and re-evaluation of patient's condition.       Final diagnoses:  Severe sepsis (HCC)  Community acquired pneumonia, unspecified laterality  Alcoholic cirrhosis of liver with ascites (HCC)  AKI (acute kidney injury) [facility])    ED Discharge Orders     None          Doretha Folks, MD 05/29/24 2239

## 2024-05-30 ENCOUNTER — Inpatient Hospital Stay (HOSPITAL_COMMUNITY)

## 2024-05-30 DIAGNOSIS — E871 Hypo-osmolality and hyponatremia: Secondary | ICD-10-CM | POA: Diagnosis present

## 2024-05-30 DIAGNOSIS — N179 Acute kidney failure, unspecified: Secondary | ICD-10-CM

## 2024-05-30 DIAGNOSIS — J9602 Acute respiratory failure with hypercapnia: Secondary | ICD-10-CM | POA: Diagnosis not present

## 2024-05-30 DIAGNOSIS — R6521 Severe sepsis with septic shock: Secondary | ICD-10-CM | POA: Diagnosis not present

## 2024-05-30 DIAGNOSIS — K7031 Alcoholic cirrhosis of liver with ascites: Secondary | ICD-10-CM | POA: Diagnosis not present

## 2024-05-30 DIAGNOSIS — R6520 Severe sepsis without septic shock: Secondary | ICD-10-CM | POA: Diagnosis not present

## 2024-05-30 DIAGNOSIS — D696 Thrombocytopenia, unspecified: Secondary | ICD-10-CM | POA: Diagnosis present

## 2024-05-30 DIAGNOSIS — G9341 Metabolic encephalopathy: Secondary | ICD-10-CM | POA: Diagnosis not present

## 2024-05-30 DIAGNOSIS — J189 Pneumonia, unspecified organism: Secondary | ICD-10-CM

## 2024-05-30 DIAGNOSIS — I471 Supraventricular tachycardia, unspecified: Secondary | ICD-10-CM | POA: Diagnosis present

## 2024-05-30 DIAGNOSIS — R571 Hypovolemic shock: Secondary | ICD-10-CM | POA: Diagnosis present

## 2024-05-30 DIAGNOSIS — Z515 Encounter for palliative care: Secondary | ICD-10-CM | POA: Diagnosis not present

## 2024-05-30 DIAGNOSIS — R7401 Elevation of levels of liver transaminase levels: Secondary | ICD-10-CM

## 2024-05-30 DIAGNOSIS — J159 Unspecified bacterial pneumonia: Secondary | ICD-10-CM | POA: Diagnosis present

## 2024-05-30 DIAGNOSIS — Z66 Do not resuscitate: Secondary | ICD-10-CM | POA: Diagnosis not present

## 2024-05-30 DIAGNOSIS — R008 Other abnormalities of heart beat: Secondary | ICD-10-CM | POA: Diagnosis not present

## 2024-05-30 DIAGNOSIS — I959 Hypotension, unspecified: Secondary | ICD-10-CM | POA: Diagnosis present

## 2024-05-30 DIAGNOSIS — J90 Pleural effusion, not elsewhere classified: Secondary | ICD-10-CM

## 2024-05-30 DIAGNOSIS — E870 Hyperosmolality and hypernatremia: Secondary | ICD-10-CM | POA: Diagnosis not present

## 2024-05-30 DIAGNOSIS — K7460 Unspecified cirrhosis of liver: Secondary | ICD-10-CM | POA: Diagnosis not present

## 2024-05-30 DIAGNOSIS — J948 Other specified pleural conditions: Secondary | ICD-10-CM | POA: Diagnosis present

## 2024-05-30 DIAGNOSIS — I8510 Secondary esophageal varices without bleeding: Secondary | ICD-10-CM | POA: Diagnosis present

## 2024-05-30 DIAGNOSIS — R188 Other ascites: Secondary | ICD-10-CM

## 2024-05-30 DIAGNOSIS — R579 Shock, unspecified: Secondary | ICD-10-CM | POA: Diagnosis not present

## 2024-05-30 DIAGNOSIS — J80 Acute respiratory distress syndrome: Secondary | ICD-10-CM | POA: Diagnosis present

## 2024-05-30 DIAGNOSIS — E872 Acidosis, unspecified: Secondary | ICD-10-CM | POA: Diagnosis present

## 2024-05-30 DIAGNOSIS — G40501 Epileptic seizures related to external causes, not intractable, with status epilepticus: Secondary | ICD-10-CM | POA: Diagnosis present

## 2024-05-30 DIAGNOSIS — I700 Atherosclerosis of aorta: Secondary | ICD-10-CM | POA: Diagnosis present

## 2024-05-30 DIAGNOSIS — J440 Chronic obstructive pulmonary disease with acute lower respiratory infection: Secondary | ICD-10-CM | POA: Diagnosis present

## 2024-05-30 DIAGNOSIS — E8729 Other acidosis: Secondary | ICD-10-CM

## 2024-05-30 DIAGNOSIS — R7989 Other specified abnormal findings of blood chemistry: Secondary | ICD-10-CM

## 2024-05-30 DIAGNOSIS — K7290 Hepatic failure, unspecified without coma: Secondary | ICD-10-CM | POA: Diagnosis not present

## 2024-05-30 DIAGNOSIS — K7010 Alcoholic hepatitis without ascites: Secondary | ICD-10-CM | POA: Diagnosis not present

## 2024-05-30 DIAGNOSIS — I10 Essential (primary) hypertension: Secondary | ICD-10-CM | POA: Diagnosis present

## 2024-05-30 DIAGNOSIS — N170 Acute kidney failure with tubular necrosis: Secondary | ICD-10-CM | POA: Diagnosis present

## 2024-05-30 DIAGNOSIS — K7011 Alcoholic hepatitis with ascites: Secondary | ICD-10-CM | POA: Diagnosis present

## 2024-05-30 DIAGNOSIS — J9601 Acute respiratory failure with hypoxia: Secondary | ICD-10-CM | POA: Diagnosis not present

## 2024-05-30 DIAGNOSIS — A419 Sepsis, unspecified organism: Secondary | ICD-10-CM | POA: Diagnosis present

## 2024-05-30 DIAGNOSIS — K7682 Hepatic encephalopathy: Secondary | ICD-10-CM | POA: Diagnosis present

## 2024-05-30 DIAGNOSIS — K766 Portal hypertension: Secondary | ICD-10-CM | POA: Diagnosis present

## 2024-05-30 DIAGNOSIS — Z7189 Other specified counseling: Secondary | ICD-10-CM | POA: Diagnosis not present

## 2024-05-30 DIAGNOSIS — J690 Pneumonitis due to inhalation of food and vomit: Secondary | ICD-10-CM | POA: Diagnosis present

## 2024-05-30 LAB — URINALYSIS, W/ REFLEX TO CULTURE (INFECTION SUSPECTED)
Glucose, UA: NEGATIVE mg/dL
Ketones, ur: 5 mg/dL — AB
Leukocytes,Ua: NEGATIVE
Nitrite: NEGATIVE
Protein, ur: 30 mg/dL — AB
Specific Gravity, Urine: >1.046 — ABNORMAL HIGH (ref 1.005–1.030)
pH: 5 (ref 5.0–8.0)

## 2024-05-30 LAB — COMPREHENSIVE METABOLIC PANEL WITH GFR
ALT: 142 U/L — ABNORMAL HIGH (ref 0–44)
AST: 464 U/L — ABNORMAL HIGH (ref 15–41)
Albumin: 2.1 g/dL — ABNORMAL LOW (ref 3.5–5.0)
Alkaline Phosphatase: 137 U/L — ABNORMAL HIGH (ref 38–126)
Anion gap: 16 — ABNORMAL HIGH (ref 5–15)
BUN: 9 mg/dL (ref 6–20)
CO2: 19 mmol/L — ABNORMAL LOW (ref 22–32)
Calcium: 8.5 mg/dL — ABNORMAL LOW (ref 8.9–10.3)
Chloride: 94 mmol/L — ABNORMAL LOW (ref 98–111)
Creatinine, Ser: 1.26 mg/dL — ABNORMAL HIGH (ref 0.61–1.24)
GFR, Estimated: >60 mL/min (ref >60)
Glucose, Bld: 89 mg/dL (ref 70–99)
Potassium: 3.6 mmol/L (ref 3.5–5.1)
Sodium: 129 mmol/L — ABNORMAL LOW (ref 135–145)
Total Bilirubin: 22.1 mg/dL (ref 0.0–1.2)
Total Protein: 7 g/dL (ref 6.5–8.1)

## 2024-05-30 LAB — CBC
HCT: 33.5 % — ABNORMAL LOW (ref 39.0–52.0)
HCT: 33.6 % — ABNORMAL LOW (ref 39.0–52.0)
Hemoglobin: 12.1 g/dL — ABNORMAL LOW (ref 13.0–17.0)
Hemoglobin: 12 g/dL — ABNORMAL LOW (ref 13.0–17.0)
MCH: 34.9 pg — ABNORMAL HIGH (ref 26.0–34.0)
MCH: 34.7 pg — ABNORMAL HIGH (ref 26.0–34.0)
MCHC: 36.1 g/dL — ABNORMAL HIGH (ref 30.0–36.0)
MCHC: 35.7 g/dL (ref 30.0–36.0)
MCV: 96.5 fL (ref 80.0–100.0)
MCV: 97.1 fL (ref 80.0–100.0)
Platelets: 57 K/uL — ABNORMAL LOW (ref 150–400)
Platelets: 50 K/uL — ABNORMAL LOW (ref 150–400)
RBC: 3.47 MIL/uL — ABNORMAL LOW (ref 4.22–5.81)
RBC: 3.46 MIL/uL — ABNORMAL LOW (ref 4.22–5.81)
RDW: 16.9 % — ABNORMAL HIGH (ref 11.5–15.5)
RDW: 16.9 % — ABNORMAL HIGH (ref 11.5–15.5)
WBC: 13.2 K/uL — ABNORMAL HIGH (ref 4.0–10.5)
WBC: 12.9 K/uL — ABNORMAL HIGH (ref 4.0–10.5)
nRBC: 0 % (ref 0.0–0.2)
nRBC: 0 % (ref 0.0–0.2)

## 2024-05-30 LAB — STREP PNEUMONIAE URINARY ANTIGEN: Strep Pneumo Urinary Antigen: NEGATIVE

## 2024-05-30 LAB — RESPIRATORY PANEL BY PCR

## 2024-05-30 LAB — BODY FLUID CELL COUNT WITH DIFFERENTIAL
Eos, Fluid: 0 %
Lymphs, Fluid: 38 %
Monocyte-Macrophage-Serous Fluid: 49 % — ABNORMAL LOW (ref 50–90)
Neutrophil Count, Fluid: 14 % (ref 0–25)
Total Nucleated Cell Count, Fluid: 167 uL (ref 0–1000)

## 2024-05-30 LAB — GLUCOSE, CAPILLARY
Glucose-Capillary: 64 mg/dL — ABNORMAL LOW (ref 70–99)
Glucose-Capillary: 78 mg/dL (ref 70–99)
Glucose-Capillary: 100 mg/dL — ABNORMAL HIGH (ref 70–99)
Glucose-Capillary: 98 mg/dL (ref 70–99)
Glucose-Capillary: 138 mg/dL — ABNORMAL HIGH (ref 70–99)
Glucose-Capillary: 152 mg/dL — ABNORMAL HIGH (ref 70–99)

## 2024-05-30 LAB — MRSA NEXT GEN BY PCR, NASAL: MRSA by PCR Next Gen: NOT DETECTED

## 2024-05-30 LAB — BASIC METABOLIC PANEL WITH GFR
Anion gap: 17 — ABNORMAL HIGH (ref 5–15)
BUN: 10 mg/dL (ref 6–20)
CO2: 18 mmol/L — ABNORMAL LOW (ref 22–32)
Calcium: 8.4 mg/dL — ABNORMAL LOW (ref 8.9–10.3)
Chloride: 93 mmol/L — ABNORMAL LOW (ref 98–111)
Creatinine, Ser: 1.16 mg/dL (ref 0.61–1.24)
GFR, Estimated: >60 mL/min (ref >60)
Glucose, Bld: 70 mg/dL (ref 70–99)
Potassium: 4 mmol/L (ref 3.5–5.1)
Sodium: 128 mmol/L — ABNORMAL LOW (ref 135–145)

## 2024-05-30 LAB — PROTIME-INR
INR: 2.2 — ABNORMAL HIGH (ref 0.8–1.2)
Prothrombin Time: 25.1 s — ABNORMAL HIGH (ref 11.4–15.2)

## 2024-05-30 LAB — AMMONIA: Ammonia: 53 umol/L — ABNORMAL HIGH (ref 9–35)

## 2024-05-30 LAB — LACTIC ACID, PLASMA
Lactic Acid, Venous: 5.9 mmol/L (ref 0.5–1.9)
Lactic Acid, Venous: 6.8 mmol/L (ref 0.5–1.9)

## 2024-05-30 LAB — HIV ANTIBODY (ROUTINE TESTING W REFLEX): HIV Screen 4th Generation wRfx: NONREACTIVE

## 2024-05-30 LAB — HEMOGLOBIN A1C
Hgb A1c MFr Bld: 3.8 % — ABNORMAL LOW (ref 4.8–5.6)
Mean Plasma Glucose: 62.36 mg/dL

## 2024-05-30 LAB — CBG MONITORING, ED: Glucose-Capillary: 63 mg/dL — ABNORMAL LOW (ref 70–99)

## 2024-05-30 LAB — MAGNESIUM
Magnesium: 1.2 mg/dL — ABNORMAL LOW (ref 1.7–2.4)
Magnesium: 2.5 mg/dL — ABNORMAL HIGH (ref 1.7–2.4)

## 2024-05-30 MED ORDER — INSULIN ASPART 100 UNIT/ML IJ SOLN
0.0000 [IU] | INTRAMUSCULAR | Status: DC
  Administered 2024-05-30: 1 [IU] via SUBCUTANEOUS
  Administered 2024-05-30 – 2024-05-31 (×2): 2 [IU] via SUBCUTANEOUS
  Administered 2024-05-31 – 2024-06-01 (×4): 1 [IU] via SUBCUTANEOUS
  Administered 2024-06-01: 2 [IU] via SUBCUTANEOUS
  Administered 2024-06-01 (×3): 1 [IU] via SUBCUTANEOUS
  Administered 2024-06-02: 2 [IU] via SUBCUTANEOUS
  Administered 2024-06-02 (×2): 1 [IU] via SUBCUTANEOUS
  Administered 2024-06-02: 2 [IU] via SUBCUTANEOUS
  Administered 2024-06-02: 1 [IU] via SUBCUTANEOUS
  Administered 2024-06-02: 2 [IU] via SUBCUTANEOUS
  Administered 2024-06-03 (×2): 1 [IU] via SUBCUTANEOUS
  Administered 2024-06-03 (×2): 2 [IU] via SUBCUTANEOUS
  Administered 2024-06-03: 1 [IU] via SUBCUTANEOUS
  Administered 2024-06-03: 2 [IU] via SUBCUTANEOUS
  Administered 2024-06-03 – 2024-06-04 (×3): 1 [IU] via SUBCUTANEOUS
  Administered 2024-06-04 (×2): 2 [IU] via SUBCUTANEOUS
  Administered 2024-06-04 – 2024-06-05 (×4): 1 [IU] via SUBCUTANEOUS
  Administered 2024-06-05 (×3): 2 [IU] via SUBCUTANEOUS
  Administered 2024-06-06: 3 [IU] via SUBCUTANEOUS
  Administered 2024-06-06 (×2): 1 [IU] via SUBCUTANEOUS
  Administered 2024-06-06: 2 [IU] via SUBCUTANEOUS
  Administered 2024-06-06: 1 [IU] via SUBCUTANEOUS
  Administered 2024-06-06: 2 [IU] via SUBCUTANEOUS
  Administered 2024-06-07 (×3): 1 [IU] via SUBCUTANEOUS
  Administered 2024-06-07: 2 [IU] via SUBCUTANEOUS

## 2024-05-30 MED ORDER — SODIUM CHLORIDE 0.9 % IV SOLN
1.0000 g | Freq: Once | INTRAVENOUS | Status: AC
  Administered 2024-05-30: 1 g via INTRAVENOUS
  Filled 2024-05-30: qty 10

## 2024-05-30 MED ORDER — CHLORHEXIDINE GLUCONATE CLOTH 2 % EX PADS
6.0000 | MEDICATED_PAD | Freq: Every day | CUTANEOUS | Status: DC
  Administered 2024-05-30 – 2024-06-08 (×10): 6 via TOPICAL

## 2024-05-30 MED ORDER — SODIUM CHLORIDE 0.9 % IV SOLN
2.0000 g | INTRAVENOUS | Status: DC
  Administered 2024-05-30: 2 g via INTRAVENOUS
  Filled 2024-05-30: qty 20

## 2024-05-30 MED ORDER — THIAMINE MONONITRATE 100 MG PO TABS
100.0000 mg | ORAL_TABLET | ORAL | Status: DC
  Administered 2024-05-30 – 2024-06-03 (×5): 100 mg via ORAL
  Filled 2024-05-30 (×5): qty 1

## 2024-05-30 MED ORDER — SODIUM CHLORIDE 0.9 % IV SOLN: 500.0000 mg | INTRAVENOUS | Status: DC

## 2024-05-30 MED ORDER — FOLIC ACID 1 MG PO TABS
1.0000 mg | ORAL_TABLET | Freq: Every day | ORAL | Status: DC
  Administered 2024-05-31 – 2024-06-03 (×4): 1 mg via ORAL
  Filled 2024-05-30 (×5): qty 1

## 2024-05-30 MED ORDER — GUAIFENESIN 100 MG/5ML PO LIQD
5.0000 mL | ORAL | Status: DC | PRN
  Administered 2024-05-30: 5 mL via ORAL
  Filled 2024-05-30: qty 10

## 2024-05-30 MED ORDER — LORAZEPAM 2 MG/ML IJ SOLN
1.0000 mg | INTRAMUSCULAR | Status: AC | PRN
  Administered 2024-05-31: 2 mg via INTRAVENOUS
  Filled 2024-05-30 (×2): qty 1

## 2024-05-30 MED ORDER — FOLIC ACID 1 MG PO TABS
1.0000 mg | ORAL_TABLET | Freq: Every day | ORAL | Status: DC
  Administered 2024-05-30: 1 mg via ORAL

## 2024-05-30 MED ORDER — THIAMINE HCL 100 MG/ML IJ SOLN
200.0000 mg | INTRAVENOUS | Status: DC
  Filled 2024-05-30: qty 2

## 2024-05-30 MED ORDER — PANTOPRAZOLE SODIUM 40 MG PO TBEC
40.0000 mg | DELAYED_RELEASE_TABLET | Freq: Every day | ORAL | Status: DC
  Administered 2024-05-30 – 2024-05-31 (×2): 40 mg via ORAL
  Filled 2024-05-30 (×2): qty 1

## 2024-05-30 MED ORDER — ALBUMIN HUMAN 25 % IV SOLN
25.0000 g | Freq: Four times a day (QID) | INTRAVENOUS | Status: DC
  Administered 2024-05-30: 25 g via INTRAVENOUS
  Filled 2024-05-30: qty 100

## 2024-05-30 MED ORDER — NICOTINE 21 MG/24HR TD PT24
21.0000 mg | MEDICATED_PATCH | Freq: Every day | TRANSDERMAL | Status: DC
  Administered 2024-05-30 – 2024-06-08 (×10): 21 mg via TRANSDERMAL
  Filled 2024-05-30 (×10): qty 1

## 2024-05-30 MED ORDER — ADULT MULTIVITAMIN W/MINERALS CH
1.0000 | ORAL_TABLET | Freq: Every day | ORAL | Status: DC
  Administered 2024-05-30 – 2024-06-03 (×5): 1 via ORAL
  Filled 2024-05-30 (×5): qty 1

## 2024-05-30 MED ORDER — THIAMINE MONONITRATE 100 MG PO TABS: 100.0000 mg | ORAL_TABLET | Freq: Every day | ORAL | Status: DC

## 2024-05-30 MED ORDER — METOPROLOL TARTRATE 5 MG/5ML IV SOLN
5.0000 mg | Freq: Once | INTRAVENOUS | Status: AC
  Administered 2024-05-30: 5 mg via INTRAVENOUS
  Filled 2024-05-30: qty 5

## 2024-05-30 MED ORDER — ALBUMIN HUMAN 25 % IV SOLN
25.0000 g | Freq: Four times a day (QID) | INTRAVENOUS | Status: DC
  Administered 2024-05-31: 25 g via INTRAVENOUS
  Filled 2024-05-30: qty 100

## 2024-05-30 MED ORDER — ALBUMIN HUMAN 25 % IV SOLN
12.5000 g | Freq: Four times a day (QID) | INTRAVENOUS | Status: DC
  Administered 2024-05-30: 12.5 g via INTRAVENOUS
  Filled 2024-05-30: qty 50

## 2024-05-30 MED ORDER — ALBUMIN HUMAN 25 % IV SOLN: 25.0000 g | Freq: Two times a day (BID) | INTRAVENOUS | Status: DC

## 2024-05-30 MED ORDER — MAGNESIUM SULFATE 2 GM/50ML IV SOLN
2.0000 g | Freq: Once | INTRAVENOUS | Status: AC
Start: 2024-05-30 — End: 2024-05-30
  Administered 2024-05-30: 2 g via INTRAVENOUS
  Filled 2024-05-30: qty 50

## 2024-05-30 MED ORDER — ONDANSETRON HCL 4 MG/2ML IJ SOLN: 4.0000 mg | Freq: Four times a day (QID) | INTRAMUSCULAR | Status: DC | PRN

## 2024-05-30 MED ORDER — MAGNESIUM SULFATE 4 GM/100ML IV SOLN
4.0000 g | Freq: Once | INTRAVENOUS | Status: AC
Start: 2024-05-30 — End: 2024-05-30
  Administered 2024-05-30: 4 g via INTRAVENOUS
  Filled 2024-05-30: qty 100

## 2024-05-30 MED ORDER — SODIUM CHLORIDE 0.9 % IV SOLN
100.0000 mg | Freq: Two times a day (BID) | INTRAVENOUS | Status: DC
  Administered 2024-05-30: 100 mg via INTRAVENOUS
  Filled 2024-05-30 (×2): qty 100

## 2024-05-30 MED ORDER — RIFAXIMIN 550 MG PO TABS
550.0000 mg | ORAL_TABLET | Freq: Two times a day (BID) | ORAL | Status: DC
  Administered 2024-05-30 – 2024-06-03 (×9): 550 mg via ORAL
  Filled 2024-05-30 (×9): qty 1

## 2024-05-30 MED ORDER — DEXTROSE 50 % IV SOLN
INTRAVENOUS | Status: AC
  Filled 2024-05-30: qty 50

## 2024-05-30 MED ORDER — LACTULOSE 10 GM/15ML PO SOLN
10.0000 g | Freq: Every day | ORAL | Status: DC
  Administered 2024-05-30 – 2024-05-31 (×2): 10 g via ORAL
  Filled 2024-05-30 (×2): qty 15

## 2024-05-30 MED ORDER — PREDNISONE 20 MG PO TABS
50.0000 mg | ORAL_TABLET | Freq: Every day | ORAL | Status: DC
  Administered 2024-05-30 – 2024-06-01 (×3): 50 mg via ORAL
  Filled 2024-05-30 (×3): qty 1

## 2024-05-30 MED ORDER — OXYCODONE HCL 5 MG PO TABS
5.0000 mg | ORAL_TABLET | Freq: Four times a day (QID) | ORAL | Status: DC | PRN
  Administered 2024-05-30 – 2024-05-31 (×4): 5 mg via ORAL
  Filled 2024-05-30 (×4): qty 1

## 2024-05-30 MED ORDER — POTASSIUM CHLORIDE CRYS ER 20 MEQ PO TBCR
40.0000 meq | EXTENDED_RELEASE_TABLET | Freq: Once | ORAL | Status: AC
  Administered 2024-05-30: 40 meq via ORAL
  Filled 2024-05-30: qty 2

## 2024-05-30 MED ORDER — LACTULOSE 10 GM/15ML PO SOLN: 20.0000 g | Freq: Every day | ORAL | Status: DC | PRN

## 2024-05-30 MED ORDER — OCTREOTIDE ACETATE 100 MCG/ML IJ SOLN
100.0000 ug | Freq: Three times a day (TID) | INTRAMUSCULAR | Status: DC
  Administered 2024-05-30: 100 ug via SUBCUTANEOUS
  Filled 2024-05-30 (×3): qty 1

## 2024-05-30 MED ORDER — SODIUM CHLORIDE 0.9 % IV SOLN: 1.0000 g | INTRAVENOUS | Status: DC

## 2024-05-30 MED ORDER — MIDAZOLAM HCL 2 MG/2ML IJ SOLN
1.0000 mg | INTRAMUSCULAR | Status: DC | PRN
  Administered 2024-05-30: 2 mg via INTRAVENOUS
  Filled 2024-05-30: qty 2

## 2024-05-30 MED ORDER — MIDODRINE HCL 5 MG PO TABS
5.0000 mg | ORAL_TABLET | Freq: Three times a day (TID) | ORAL | Status: DC
  Administered 2024-05-30 – 2024-05-31 (×6): 5 mg via ORAL
  Filled 2024-05-30 (×6): qty 1

## 2024-05-30 MED ORDER — ALBUMIN HUMAN 25 % IV SOLN
25.0000 g | Freq: Four times a day (QID) | INTRAVENOUS | Status: AC
  Administered 2024-05-30 (×2): 25 g via INTRAVENOUS
  Filled 2024-05-30 (×2): qty 100

## 2024-05-30 MED ORDER — LORAZEPAM 1 MG PO TABS
1.0000 mg | ORAL_TABLET | ORAL | Status: AC | PRN
  Administered 2024-05-30 – 2024-05-31 (×3): 1 mg via ORAL
  Filled 2024-05-30 (×3): qty 1

## 2024-05-30 NOTE — H&P (Signed)
 NAME:  Omar Turner, MRN:  [id number], DOB:  1970-11-21, LOS: 0 ADMISSION DATE:  05/29/2024, CONSULTATION DATE:  05/30/2024 REFERRING MD:  Benton Shone, MD, CHIEF COMPLAINT:  SOB  History of Present Illness:  53 y/o male with PMH for Alcoholic Cirrhosis, Esophageal Varices s/p clipping, COPD, active drinker (1 pint Vodka daily) and smoker (1ppd), Ascites s/p multiple paracentesis, DT's, Withdrawal Seizures, recent MRI abd showing right pleural effusion.  He presented with SOB today.  He says for the last 3 days he has been very weak, not able to get out of bed much.  He has been having N/V/D.  He cannot hold much down and has not taken his meds in a couple days as a result.  He is more SOB and has a dry cough and has been wheezing.  He has an Albuterol  inhaler which he uses prn.  He also has abd pain throughout.  Apparently he was sitting on his porch and found himself to be light headed, almost passing out and fast heart rate.  EMS found him with HR 180's and gave him a 100J shock.  His HR decreased to the 160's.  He remained hypotensive.  His LA was 7.83.  A paracentesis was done in the ED and he had a paracentesis 1 week ago outpatient with about 2 liters removed.  Pertinent  Medical History  Alcoholic Cirrhosis, Esophageal Varices s/p clipping, COPD, active drinker (1 pint Vodka daily) and smoker (1ppd), Ascites s/p multiple paracentesis, DT's, Withdrawal Seizures, recent MRI abd showing right pleural effusion.    Significant Hospital Events: Including procedures, antibiotic start and stop dates in addition to other pertinent events   8/24: Admit to ICU  Interim History / Subjective:  N/A  Objective    Blood pressure 103/69, pulse (!) 147, temperature 98.3 F (36.8 C), temperature source Oral, resp. rate 14, weight 102.5 kg, SpO2 94%.        Intake/Output Summary (Last 24 hours) at 05/30/2024 0121 Last data filed at 05/29/2024 2150 Gross per 24 hour  Intake 3100 ml  Output --   Net 3100 ml   Filed Weights   05/29/24 1852  Weight: 102.5 kg    Examination: General: Alert and awake NAD HENT: PERRLA, EOMI, Icteric sclerae, dry mm Lungs: CTA no wheezes no rales, diminished bs b/l bases Cardiovascular: reg, tachy, no murmurs or rubs Abdomen: soft distend and mildly tender throughout bs pos,  Extremities: no cyanosis, clubbing or edema Neuro: AAO x 3, CN II to XII grossly intact GU: n/a  Resolved problem list   Assessment and Plan  SVT s/p cardioversion with 1 shock 100J Sinus vs abberancy Tachycardia Plan to give Albumin  (level 2.1) and IV fluids BP would not tolerate meds to slow HR Hypotension Albumin  and IV fluids May require IV vasopressors Add midodrine  Lactic Acidosis Secondary to infection plus los BP and liver cirrhosis Continue to monitor Pneumonia-CAP vs Aspiration Broad spectrum antibiotics Large right pleural effusion Will need thoracentesis Not suspecting Empyema, free flowing fluid Ascites s/p paracentesis in ED S/p Paracentesis-fluid not suggestive of SBP AKI Pre-renal and ATN from low flow state Monitor serum Cr Monitor Is/Os Transaminates From low flow and liver cirrhosis Alcoholic Liver Cirrhosis Monitor for withdrawal Thiamine  and Folate Thrombocytopenia Monitor levels, secondary to liver disease Coagulopathy Secondary to Alcoholic liver disease  Best Practice (right click and Reselect all SmartList Selections daily)   Diet/type: NPO w/ oral meds DVT prophylaxis prophylactic heparin   Pressure ulcer(s): N/A GI prophylaxis: PPI Lines:  N/A Foley:  N/A Code Status:  full code   Labs   CBC: Recent Labs  Lab 05/29/24 1826 05/29/24 1835  WBC 9.7  --   NEUTROABS 7.5  --   HGB 13.2 13.6  HCT 37.3* 40.0  MCV 99.2  --   PLT 57*  --     Basic Metabolic Panel: Recent Labs  Lab 05/29/24 1826 05/29/24 1835  NA 127* 130*  K 3.3* 3.3*  CL 88*  --   CO2 17*  --   GLUCOSE 77  --   BUN 11  --   CREATININE  1.60*  --   CALCIUM  8.0*  --    GFR: Estimated Creatinine Clearance: 66.6 mL/min (A) (by C-G formula based on SCr of 1.6 mg/dL (H)). Recent Labs  Lab 05/29/24 1826 05/29/24 1835 05/29/24 2132  WBC 9.7  --   --   LATICACIDVEN  --  7.9* 7.8*    Liver Function Tests: Recent Labs  Lab 05/29/24 1826  AST 326*  ALT 106*  ALKPHOS 164*  BILITOT 19.7*  PROT 7.3  ALBUMIN  2.1*   Recent Labs  Lab 05/29/24 1826  LIPASE 57*   Recent Labs  Lab 05/29/24 1827  AMMONIA 29    ABG    Component Value Date/Time   HCO3 22.3 05/29/2024 1835   TCO2 23 05/29/2024 1835   ACIDBASEDEF 1.0 05/29/2024 1835   O2SAT 65 05/29/2024 1835     Coagulation Profile: Recent Labs  Lab 05/29/24 1826  INR 2.1*    Cardiac Enzymes: No results for input(s): CKTOTAL, CKMB, CKMBINDEX, TROPONINI in the last 168 hours.  HbA1C: No results found for: HGBA1C  CBG: No results for input(s): GLUCAP in the last 168 hours.  Review of Systems:   SOB, cough, wheezing, N/V/D, blood in stools he feels from hemorrhoids more than dark stools in fact stools loose and not dark, but has dark urine.  Drinking 1 pint of Vodka daily  Past Medical History:  He,  has a past medical history of Alcoholic cirrhosis of liver with ascites (HCC), Anginal pain (HCC), Anxiety, Ascites, Atrial fibrillation with RVR (HCC), Basal cell carcinoma (11/26/2022), Basal cell carcinoma (11/26/2022), Basal cell carcinoma (05/29/2021), Basal cell carcinoma (06/26/2021), Basal cell carcinoma (06/12/2021), Basal cell carcinoma (06/12/2021), Basal cell carcinoma (06/12/2021), Basal cell carcinoma (12/04/2022), Basal cell carcinoma (12/04/2022), Basal cell carcinoma (12/04/2022), Basal cell carcinoma (12/04/2022), Basal cell carcinoma (12/30/2022), Basal cell carcinoma (04/01/2023), CHF (congestive heart failure) (HCC), Cirrhosis (HCC), Coronary artery disease, Degenerative disc disease, cervical, Diverticulitis large intestine,  dysplastic nevus (11/26/2022), Esophageal varices with bleeding in diseases classified elsewhere (HCC), Gastritis with bleeding, GERD (gastroesophageal reflux disease), History of chest pain, Hyperlipidemia, Hypertension, Hypomagnesemia, Hyponatremia, Hyponatremia, Paroxysmal A-fib (HCC), Portal hypertensive gastropathy (HCC), Sleep apnea, 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   Surgical History:   Past Surgical History:  Procedure Laterality Date   APPENDECTOMY     CARDIAC CATHETERIZATION Left 06/17/2016   Procedure: Left Heart Cath and Coronary Angiography;  Surgeon: Deatrice DELENA Cage, MD;  Location: [facility] INVASIVE CV LAB;  Service: Cardiovascular;  Laterality: Left;   COLONOSCOPY     COLONOSCOPY WITH PROPOFOL  N/A 09/07/2015   Procedure: COLONOSCOPY WITH PROPOFOL ;  Surgeon: Deward CINDERELLA Piedmont, MD;  Location[facility] ENDOSCOPY;  Service: Gastroenterology;  Laterality: N/A;   COLONOSCOPY WITH PROPOFOL  N/A 05/12/2018   Procedure: COLONOSCOPY WITH PROPOFOL ;  Surgeon: Therisa Bi, MD;  Location: [facility] ENDOSCOPY;  Service: Gastroenterology;  Laterality: N/A;   COLONOSCOPY WITH PROPOFOL  N/A 02/26/2022   Procedure: COLONOSCOPY WITH PROPOFOL ;  Surgeon: Shila Gustav GAILS, MD;  Location: [facility] ENDOSCOPY;  Service: Gastroenterology;  Laterality: N/A;   ESOPHAGEAL BANDING  05/09/2023   Procedure: ESOPHAGEAL BANDING;  Surgeon: Aundria, Ladell POUR, MD;  Location: [facility] ENDOSCOPY;  Service: Gastroenterology;;   ESOPHAGEAL BANDING  11/18/2023   Procedure: ESOPHAGEAL BANDING;  Surgeon: Jinny Carmine, MD;  Location: [facility] ENDOSCOPY;  Service: Endoscopy;;   ESOPHAGEAL BANDING  01/06/2024   Procedure: ESOPHAGOSCOPY, WITH ESOPHAGEAL VARICES BAND LIGATION;  Surgeon: Aundria, Ladell POUR, MD;  Location: [facility] ENDOSCOPY;  Service: Gastroenterology;;   ESOPHAGEAL BRUSHING  01/06/2024   Procedure: ESOPHAGOSCOPY, WITH BRUSH BIOPSY;  Surgeon: Aundria, Ladell POUR,  MD;  Location: [facility] ENDOSCOPY;  Service: Gastroenterology;;   ESOPHAGOGASTRODUODENOSCOPY N/A 01/06/2024   Procedure: EGD (ESOPHAGOGASTRODUODENOSCOPY);  Surgeon: Toledo, Ladell POUR, MD;  Location: [facility] ENDOSCOPY;  Service: Gastroenterology;  Laterality: N/A;   ESOPHAGOGASTRODUODENOSCOPY N/A 02/24/2024   Procedure: EGD (ESOPHAGOGASTRODUODENOSCOPY);  Surgeon: Toledo, Ladell POUR, MD;  Location: [facility] ENDOSCOPY;  Service: Gastroenterology;  Laterality: N/A;   ESOPHAGOGASTRODUODENOSCOPY (EGD) WITH PROPOFOL  N/A 11/16/2021   Procedure: ESOPHAGOGASTRODUODENOSCOPY (EGD) WITH PROPOFOL ;  Surgeon: Onita Elspeth Sharper, DO;  Location: [facility] ENDOSCOPY;  Service: Gastroenterology;  Laterality: N/A;   ESOPHAGOGASTRODUODENOSCOPY (EGD) WITH PROPOFOL  N/A 02/26/2022   Procedure: ESOPHAGOGASTRODUODENOSCOPY (EGD) WITH PROPOFOL ;  Surgeon: Shila Gustav GAILS, MD;  Location: [facility] ENDOSCOPY;  Service: Gastroenterology;  Laterality: N/A;   ESOPHAGOGASTRODUODENOSCOPY (EGD) WITH PROPOFOL  N/A 05/09/2023   Procedure: ESOPHAGOGASTRODUODENOSCOPY (EGD) WITH PROPOFOL ;  Surgeon: Toledo, Ladell POUR, MD;  Location: [facility] ENDOSCOPY;  Service: Gastroenterology;  Laterality: N/A;   ESOPHAGOGASTRODUODENOSCOPY (EGD) WITH PROPOFOL  N/A 11/18/2023   Procedure: ESOPHAGOGASTRODUODENOSCOPY (EGD) WITH PROPOFOL ;  Surgeon: Jinny Carmine, MD;  Location: [facility] ENDOSCOPY;  Service: Endoscopy;  Laterality: N/A;   HERNIA REPAIR     IR RADIOLOGIST EVAL & MGMT  02/01/2021   POLYPECTOMY  02/26/2022   Procedure: POLYPECTOMY;  Surgeon: Shila Gustav GAILS, MD;  Location: [facility] ENDOSCOPY;  Service: Gastroenterology;;   VENTRAL HERNIA REPAIR N/A 01/17/2021   Procedure: HERNIA REPAIR VENTRAL ADULT;  Surgeon: Jordis Laneta FALCON, MD;  Location: [facility] ORS;  Service: General;  Laterality: N/A;     Social History:   reports that he has been smoking cigarettes. He has a 22.5 pack-year smoking history. He has been exposed to tobacco smoke. He has never used smokeless tobacco. He reports that  he does not currently use alcohol after a past usage of about 14.0 standard drinks of alcohol per week. He reports that he does not use drugs.   Family History:  His family history includes Heart attack in his father; Heart disease in his father; Hypertension in his father.   Allergies Allergies  Allergen Reactions   Chantix [Varenicline]    Lexapro [Escitalopram]           Home Medications  Prior to Admission medications   Medication Sig Start Date End Date Taking? Authorizing Provider  eplerenone  (INSPRA ) 50 MG tablet Take 50 mg by mouth daily. 04/11/24  Yes [provider]  furosemide  (LASIX ) 40 MG tablet Take 40 mg by mouth daily. 04/11/24  Yes [provider]  lactulose  (CHRONULAC ) 10 GM/15ML solution Take 20 g by mouth daily. 03/18/24  Yes [provider]  nadolol  (CORGARD ) 40 MG tablet Take 40 mg by mouth daily. 04/11/24  Yes [provider]  spironolactone  (ALDACTONE ) 100 MG tablet Take 100 mg by mouth daily. 04/11/24  Yes [provider]  XIFAXAN   550 MG TABS tablet Take 550 mg by mouth 2 (two) times daily. 04/11/24  Yes [provider]  albuterol  (VENTOLIN  HFA) 108 (90 Base) MCG/ACT inhaler Inhale 1-2 puffs into the lungs every 6 (six) hours as needed.    [provider]  cetirizine (ZYRTEC) 10 MG tablet Take 10 mg by mouth daily.    [provider]  eplerenone  (INSPRA ) 25 MG tablet Take 25 mg by mouth daily.    [provider]  ferrous sulfate  324 MG TBEC Take 1 tablet (324 mg total) by mouth every other day. 05/11/23   Jens Durand, MD  folic acid  (FOLVITE ) 1 MG tablet Take 1 tablet (1 mg total) by mouth daily. 12/27/23   Darci Pore, MD  furosemide  (LASIX ) 20 MG tablet Take 1 tablet (20 mg total) by mouth daily. 12/26/23   Darci Pore, MD  Multiple Vitamin (MULTIVITAMIN WITH MINERALS) TABS tablet Take 1 tablet by mouth daily. 12/27/23   Darci Pore, MD  nadolol  (CORGARD ) 20 MG  tablet Take 0.5 tablets (10 mg total) by mouth daily. 12/26/23   Darci Pore, MD  naltrexone (DEPADE) 50 MG tablet Take 1 tablet by mouth daily. 12/10/23 12/09/24  [provider]  nicotine  (NICODERM CQ  - DOSED IN MG/24 HOURS) 21 mg/24hr patch Place 1 patch (21 mg total) onto the skin daily. 12/27/23   Darci Pore, MD  ondansetron  (ZOFRAN -ODT) 4 MG disintegrating tablet Take 1 tablet (4 mg total) by mouth every 8 (eight) hours as needed for nausea or vomiting. 12/26/23   Darci Pore, MD  pantoprazole  (PROTONIX ) 40 MG tablet Take 1 tablet (40 mg total) by mouth daily. 05/11/23   Jens Durand, MD  potassium chloride  (KLOR-CON ) 10 MEQ tablet Take 10 mEq by mouth daily. 08/22/23   [provider]  predniSONE  (DELTASONE ) 20 MG tablet Take 20 mg by mouth 2 (two) times daily. 07/23/23   [provider]  thiamine  (VITAMIN B-1) 100 MG tablet Take 1 tablet (100 mg total) by mouth daily. 12/27/23   Darci Pore, MD     Critical care time: 31   The patient is critically ill with multiple organ system failure and requires high complexity decision making for assessment and support, frequent evaluation and titration of therapies, advanced monitoring, review of radiographic studies and interpretation of complex data.   Critical Care Time devoted to patient care services, exclusive of separately billable procedures, described in this note is 36 minutes.   Orlin Fairly, MD [facility] See Amion for pager  If no response to pager , please call [phone] until 7pm After 7:00 pm call Elink  [phone] 05/30/2024, 1:21 AM

## 2024-05-30 NOTE — ED Notes (Signed)
Echo being done at bedside.

## 2024-05-30 NOTE — Progress Notes (Signed)
 eLink Physician-Brief Progress Note Patient Name: Omar Turner DOB: [birth year] MRN: [id number]   Date of Service  05/30/2024  HPI/Events of Note  asking for something to help w/productive cough, bacterial pneumonia  eICU Interventions  Guaifenesin     0448 - Hypoxic on salter cannula, transisiton to [facility]  Intervention Category Minor Interventions: Routine modifications to care plan (e.g. PRN medications for pain, fever)  Noble Bodie 05/30/2024, 11:13 PM

## 2024-05-30 NOTE — Consult Note (Addendum)
 Consultation Note   Referring Provider:  PCCM PCP: Fernande Ophelia JINNY DOUGLAS, MD Primary Gastroenterologist:    [facility]     Reason for Consultation:  Decompensated cirrhosis DOA: 05/29/2024         Hospital Day: 2   ASSESSMENT    Patient Profile:  53 y.o. year old male with a medical history including but not limited to decompensated etoh cirrhosis , colon polyps, GERD, hiatal hernia, tobacco abuse,   Decompensated Etoh cirrhosis with probable superimposed alcoholic hepatitis.  He has ascites requiring frequent LVPs, a history of esophageal varices s/p banding in Feb 2024 for upper GI bleeding and a history of hepatic encephalopathy maintained on xifaxan . Followed by [facility] and established with [facility].   MELD 3.0: 32 at 05/30/2024  5:30 AM MDF 67.6 -poor prognosis. 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  PCCM planning for therapeutic paracentesis tomorrow.  On IV antibiotics  Colitis on CT scan  May be 2/2 to portal hypertension.  He is not having any diarrhea.  No abdominal pain.    Elevated creatinine Cr 1.26  Tachycardia Hypotension-resolved S/p DCCV cardioversion   Prior GI Evaluation ( Care Everywhere 03/10/24 GI note) -CSY: 09/07/2015 - one diminutive TA removed from transverse colon, otherwise normal  - CSY: 05/12/2018 - sigmoid diverticulosis, two 3-4 mm TA removed from ascending colon, two 4-6 mm TA removed from transverse and ascending colon, four 5-7 mm hyperplastic polyps removed from rectum, three 8-10 mm TA removed from sigmoid colon - EGD: 12/13/2020 - normal esophagus, 2 cm hiatal hernia, portal hypertensive gastropathy, normal examined duodenum - CSY: 12/13/2020 - sigmoid diverticulosis, four 2-7 mm polyps removed from rectum, transverse, and  ascending colon with path showing TA x2 and hyperplastic polyps x2 - EGD: 11/16/2021 - regular Z-line, LA Grade A esophagitis with no bleeding, portal hypertensive gastropathy, gastritis, normal examined duodenum  - EGD: 02/26/2022 - three columns of Grade II EV with no bleeding and no red wale signs, PHG, normal examined duodenum - CSY: 02/26/2022 - three 4-7 mm polyps removed from sigmoid and ascending colon with path showing TA and hyperplastic polyp, sigmoid diverticulosis  - EGD: 05/09/2023 - Grade II EV s/p banding x2 with complete eradication, moderate PHG, normal examined duodenum  - EGD: 11/18/2023 - Grade II EV s/p banding x4, PHG, normal examined duodenum  - EGD: 01/06/2024 - Grade II varices found in lower third of esophagus s/p banding x2, moderate PHG, no evidence of gastric varices, normal examined duodenum  - EGD: 02/24/2024 - small (<5 mm) varices found in lower third of esophagus, exam of esophagus otherwise normal, moderate PHG, normal duodenum     PLAN:   --Getting IV antibiotics for sepsis - I called lab and was able to add on cell count to yesterday's ascitic fluid  --PCCM planning for therapeutic LVP tomorrow with IV albumin .  --Monitor renal function, INR , mentation  --Continue Xifaxan  --Continue low dose lactulose  for now. Low threshold to hold as he generally has 3-4 bowel movements at home (off lactulose ).  --Home diuretics on hold for now --2  g sodium restricted diet.  -- Patient is scheduled to have surveillance EGD on Tuesday with his GI provider.  Right now I do not know that there is any indication for us  to do this inpatient.     HPI   Patient followed by GI at the [facility] clinic for decompensated alcoholic cirrhosis.  He was seen there at the end of June for routine follow-up.  He was scheduled for surveillance EGD to be done next Tuesday.  He was scheduled for follow-up MRI ( ?  Presumably for [facility] screening).  He was to follow back up with him in September after  these tests.  However in the interim he became very short of breath.  He did get the MRI done on the 14th and it showed a large right pleural effusion.He doesn't have any major GI complaints other than discomfort secondary to abdominal distention.  He has not had any nausea, vomiting, or black stools.  He has occasional scant rectal bleeding which he attributes to hemorrhoids.  He averages about 3 semisolid stools a day (does not take lactulose ).  He is compliant with Xifaxan .  He is compliant with diuretics in a 2 g sodium restricted diet.   Patient has history of alcohol abuse.  Unfortunately in July he relapsed and has been drinking every day since.  Shocked with 100 jules.  His pressures have improved with albumin , fluids and midodrine .    Lactic acid 7.8 > 6.8.   WBC 13.2 > 12.8 Alk phos 137, AST 464, ALT 142, ammonia 53, total bili 22 INR 1.22.  BUN 9 Cr 1.26 UDS positive for THC  CTAP with contrast Mild to moderate severity left lower lobe and adjacent lingular infiltrate. 2. Large right pleural effusion. 3. Cirrhotic liver with innumerable subcentimeter hepatic cysts. 4. Moderate amount of abdominopelvic ascites. 5. Markedly thickened and inflamed colon, consistent with colitis. 6. Aortic atherosclerosis.   Relevant workup thus far:   Labs and Imaging:  Recent Labs    05/29/24 1826 05/30/24 0530  PROT 7.3 7.0  ALBUMIN  2.1* 2.1*  AST 326* 464*  ALT 106* 142*  ALKPHOS 164* 137*  BILITOT 19.7* 22.1*   Recent Labs    05/29/24 1826 05/29/24 1835 05/30/24 0433 05/30/24 0530  WBC 9.7  --  13.2* 12.9*  HGB 13.2 13.6 12.1* 12.0*  HCT 37.3* 40.0 33.5* 33.6*  MCV 99.2  --  96.5 97.1  PLT 57*  --  57* 50*   Recent Labs    05/29/24 1826 05/29/24 1835 05/30/24 0433 05/30/24 0530  NA 127* 130* 128* 129*  K 3.3* 3.3* 4.0 3.6  CL 88*  --  93* 94*  CO2 17*  --  18* 19*  GLUCOSE 77  --  70 89  BUN 11  --  10 9  CREATININE 1.60*  --  1.16 1.26*  CALCIUM  8.0*  --   8.4* 8.5*     US  Abdomen Limited RUQ (LIVER/GB) CLINICAL DATA:  Cholecystitis.  EXAM: ULTRASOUND ABDOMEN LIMITED RIGHT UPPER QUADRANT  COMPARISON:  Abdomen and pelvis CT from 1 day prior.  FINDINGS: Gallbladder:  No gallstones evident. Gallbladder wall thickness is increased at 4-5 mm although gallbladder is incompletely distended. Abdominal ascites evident.  Common bile duct:  Diameter: Extrahepatic common duct could not be visualized.  Liver:  Markedly echogenic with nodular contour consistent with cirrhosis. Portal vein could not be identified due to poor acoustic through transmission of the liver parenchyma.  Other: Moderate to large volume ascites.  Right pleural effusion.  IMPRESSION: 1. Cirrhotic liver with moderate to large volume ascites. 2. Gallbladder wall thickening without gallstones. Gallbladder is incompletely distended. Gallbladder wall thickening is nonspecific in the setting of cirrhosis and ascites. 3. Right pleural effusion.  Electronically Signed   By: Camellia Candle M.D.   On: 05/30/2024 05:32    Past Medical History:  Diagnosis Date   Alcoholic cirrhosis of liver with ascites (HCC)    Anginal pain (HCC)    Anxiety    Ascites    Atrial fibrillation with RVR (HCC)    Basal cell carcinoma 11/26/2022   Superficial, left spinal upper back, clear with biopsy   Basal cell carcinoma 11/26/2022   L post neck, clear with biopsy   Basal cell carcinoma 05/29/2021   anterior neck - R anterolateral, exc at [facility]   Basal cell carcinoma 06/26/2021   L med scapular back, exc at [facility] cell carcinoma 06/12/2021   L post forearm, Arlyss Domino   Basal cell carcinoma 06/12/2021   R scapular back, Arlyss Domino   Basal cell carcinoma 06/12/2021   Central scapular back   Basal cell carcinoma 12/04/2022   Left Upper Arm - Anterior, [facility]   Basal cell carcinoma 12/04/2022   L upper chest inferior, [facility]   Basal cell carcinoma 12/04/2022    left upper chest lateral, [facility]   Basal cell carcinoma 12/04/2022   left upper chest medial, [facility]   Basal cell carcinoma 12/30/2022   R post neck, [facility]   Basal cell carcinoma 04/01/2023   left mid chest, [facility]   CHF (congestive heart failure) (HCC)    Cirrhosis (HCC)    Coronary artery disease    Degenerative disc disease, cervical    Diverticulitis large intestine    dysplastic nevus 11/26/2022   R spinal mid back, shave removal 12/30/22   Esophageal varices with bleeding in diseases classified elsewhere (HCC)    Gastritis with bleeding    GERD (gastroesophageal reflux disease)    History of chest pain    Hyperlipidemia    Hypertension    Hypomagnesemia    Hyponatremia    Hyponatremia    Paroxysmal A-fib (HCC)    Portal hypertensive gastropathy (HCC)    Sleep apnea    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   L post forearm, Arlyss Domino   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/26/2022   L upper mid back, in situ, clear with biopsy   Thrombocytopenia (HCC)    Tobacco abuse     Past Surgical History:  Procedure Laterality Date   APPENDECTOMY     CARDIAC CATHETERIZATION Left 06/17/2016   Procedure: Left Heart Cath and Coronary Angiography;  Surgeon: Deatrice DELENA Cage, MD;  Location: [facility] INVASIVE CV LAB;  Service: Cardiovascular;  Laterality: Left;   COLONOSCOPY     COLONOSCOPY WITH PROPOFOL  N/A 09/07/2015   Procedure: COLONOSCOPY WITH PROPOFOL ;  Surgeon: Deward CINDERELLA Piedmont, MD;  Location: [facility] ENDOSCOPY;  Service: Gastroenterology;  Laterality: N/A;   COLONOSCOPY WITH PROPOFOL  N/A 05/12/2018   Procedure: COLONOSCOPY WITH PROPOFOL ;  Surgeon: Therisa Bi, MD;  Location: [facility] ENDOSCOPY;  Service: Gastroenterology;  Laterality: N/A;   COLONOSCOPY WITH PROPOFOL  N/A 02/26/2022   Procedure: COLONOSCOPY WITH PROPOFOL ;  Surgeon: Shila Gustav GAILS, MD;  Location: [facility] ENDOSCOPY;  Service: Gastroenterology;  Laterality: N/A;   ESOPHAGEAL BANDING  05/09/2023   Procedure: ESOPHAGEAL BANDING;  Surgeon:  Toledo, Teodoro K, MD;  Location: [facility] ENDOSCOPY;  Service: Gastroenterology;;  ESOPHAGEAL BANDING  11/18/2023   Procedure: ESOPHAGEAL BANDING;  Surgeon: Jinny Carmine, MD;  Location: [facility] ENDOSCOPY;  Service: Endoscopy;;   ESOPHAGEAL BANDING  01/06/2024   Procedure: ESOPHAGOSCOPY, WITH ESOPHAGEAL VARICES BAND LIGATION;  Surgeon: Aundria, Ladell POUR, MD;  Location: [facility] ENDOSCOPY;  Service: Gastroenterology;;   ESOPHAGEAL BRUSHING  01/06/2024   Procedure: ESOPHAGOSCOPY, WITH BRUSH BIOPSY;  Surgeon: Aundria, Ladell POUR, MD;  Location[facility] ENDOSCOPY;  Service: Gastroenterology;;   ESOPHAGOGASTRODUODENOSCOPY N/A 01/06/2024   Procedure: EGD (ESOPHAGOGASTRODUODENOSCOPY);  Surgeon: Toledo, Ladell POUR, MD;  Location: [facility] ENDOSCOPY;  Service: Gastroenterology;  Laterality: N/A;   ESOPHAGOGASTRODUODENOSCOPY N/A 02/24/2024   Procedure: EGD (ESOPHAGOGASTRODUODENOSCOPY);  Surgeon: Toledo, Ladell POUR, MD;  Location: [facility] ENDOSCOPY;  Service: Gastroenterology;  Laterality: N/A;   ESOPHAGOGASTRODUODENOSCOPY (EGD) WITH PROPOFOL  N/A 11/16/2021   Procedure: ESOPHAGOGASTRODUODENOSCOPY (EGD) WITH PROPOFOL ;  Surgeon: Onita Elspeth Sharper, DO;  Location: [facility] ENDOSCOPY;  Service: Gastroenterology;  Laterality: N/A;   ESOPHAGOGASTRODUODENOSCOPY (EGD) WITH PROPOFOL  N/A 02/26/2022   Procedure: ESOPHAGOGASTRODUODENOSCOPY (EGD) WITH PROPOFOL ;  Surgeon: Shila Gustav GAILS, MD;  Location: [facility] ENDOSCOPY;  Service: Gastroenterology;  Laterality: N/A;   ESOPHAGOGASTRODUODENOSCOPY (EGD) WITH PROPOFOL  N/A 05/09/2023   Procedure: ESOPHAGOGASTRODUODENOSCOPY (EGD) WITH PROPOFOL ;  Surgeon: Toledo, Ladell POUR, MD;  Location: [facility] ENDOSCOPY;  Service: Gastroenterology;  Laterality: N/A;   ESOPHAGOGASTRODUODENOSCOPY (EGD) WITH PROPOFOL  N/A 11/18/2023   Procedure: ESOPHAGOGASTRODUODENOSCOPY (EGD) WITH PROPOFOL ;  Surgeon: Jinny Carmine, MD;  Location: [facility] ENDOSCOPY;  Service: Endoscopy;  Laterality: N/A;   HERNIA REPAIR     IR RADIOLOGIST EVAL & MGMT   02/01/2021   POLYPECTOMY  02/26/2022   Procedure: POLYPECTOMY;  Surgeon: Shila Gustav GAILS, MD;  Location: [facility] ENDOSCOPY;  Service: Gastroenterology;;   VENTRAL HERNIA REPAIR N/A 01/17/2021   Procedure: HERNIA REPAIR VENTRAL ADULT;  Surgeon: Jordis Laneta FALCON, MD;  Location: [facility] ORS;  Service: General;  Laterality: N/A;    Family History  Problem Relation Age of Onset   Hypertension Father    Heart disease Father        CABG x 3 & valve replacement.    Heart attack Father     Prior to Admission medications   Medication Sig Start Date End Date Taking? Authorizing Provider  acetaminophen  (TYLENOL ) 500 MG tablet Take 1,000 mg by mouth every 6 (six) hours as needed for mild pain (pain score 1-3) or moderate pain (pain score 4-6).   Yes [provider]  albuterol  (VENTOLIN  HFA) 108 (90 Base) MCG/ACT inhaler Inhale 1-2 puffs into the lungs every 6 (six) hours as needed.   Yes [provider]  cetirizine (ZYRTEC) 10 MG tablet Take 10 mg by mouth daily.   Yes [provider]  eplerenone  (INSPRA ) 50 MG tablet Take 50 mg by mouth daily. 04/11/24  Yes [provider]  furosemide  (LASIX ) 40 MG tablet Take 40 mg by mouth daily. 04/11/24  Yes [provider]  lactulose  (CHRONULAC ) 10 GM/15ML solution Take 20 g by mouth daily as needed for mild constipation or moderate constipation. 03/18/24  Yes [provider]  Multiple Vitamin (MULTIVITAMIN WITH MINERALS) TABS tablet Take 1 tablet by mouth daily. 12/27/23  Yes Darci Pore, MD  nadolol  (CORGARD ) 40 MG tablet Take 40 mg by mouth daily. 04/11/24  Yes [provider]  naltrexone (DEPADE) 50 MG tablet Take 1 tablet by mouth daily. 12/10/23 12/09/24 Yes [provider]  ondansetron  (ZOFRAN -ODT) 4 MG disintegrating tablet Take 1 tablet (4 mg total) by mouth every 8 (eight) hours as needed for nausea or vomiting.  12/26/23  Yes Darci Pore, MD  pantoprazole  (PROTONIX ) 40 MG tablet Take  1 tablet (40 mg total) by mouth daily. 05/11/23  Yes Jens Durand, MD  phenylephrine -shark liver oil-mineral oil-petrolatum (PREPARATION H) 0.25-3-14-71.9 % rectal ointment Place 1 Application rectally 2 (two) times daily as needed for hemorrhoids.   Yes [provider]  potassium chloride  (KLOR-CON ) 10 MEQ tablet Take 10 mEq by mouth daily. 08/22/23  Yes [provider]  spironolactone  (ALDACTONE ) 100 MG tablet Take 100 mg by mouth daily. 04/11/24  Yes [provider]  tetrahydrozoline 0.05 % ophthalmic solution Place 2 drops into both eyes daily as needed (irritated eyes).   Yes [provider]  thiamine  (VITAMIN B-1) 100 MG tablet Take 1 tablet (100 mg total) by mouth daily. 12/27/23  Yes Darci Pore, MD  XIFAXAN  550 MG TABS tablet Take 550 mg by mouth 2 (two) times daily. 04/11/24  Yes [provider]  ferrous sulfate  324 MG TBEC Take 1 tablet (324 mg total) by mouth every other day. Patient not taking: Reported on 05/30/2024 05/11/23   Jens Durand, MD  folic acid  (FOLVITE ) 1 MG tablet Take 1 tablet (1 mg total) by mouth daily. Patient not taking: Reported on 05/30/2024 12/27/23   Darci Pore, MD    Current Facility-Administered Medications  Medication Dose Route Frequency Provider Last Rate Last Admin   albumin  human 25 % solution 25 g  25 g Intravenous Q6H Bowser, Ronnald BRAVO, NP       Followed by   NOREEN ON 05/31/2024] albumin  human 25 % solution 25 g  25 g Intravenous Q6H Bowser, Grace E, NP       cefTRIAXone  (ROCEPHIN ) 2 g in sodium chloride  0.9 % 100 mL IVPB  2 g Intravenous Q24H Millen, Jessica B, RPH       Chlorhexidine  Gluconate Cloth 2 % PADS 6 each  6 each Topical Daily Maree Harder, MD   6 each at 05/30/24 1000   dextrose  50 % solution            doxycycline  (VIBRAMYCIN ) 100 mg in sodium chloride  0.9 % 250 mL IVPB  100 mg Intravenous Q12H Bowser, Grace E, NP       folic acid  (FOLVITE ) tablet 1 mg  1 mg Oral Daily Maree Harder, MD       insulin  aspart (novoLOG ) injection 0-9 Units  0-9 Units Subcutaneous Q4H Maree Harder, MD       lactulose  (CHRONULAC ) 10 GM/15ML solution 10 g  10 g Oral Daily Bowser, Ronnald BRAVO, NP   10 g at 05/30/24 1445   lactulose  (CHRONULAC ) 10 GM/15ML solution 20 g  20 g Oral Daily PRN Paliwal, Ria, MD       magnesium  sulfate IVPB 2 g 50 mL  2 g Intravenous Once Bowser, Grace E, NP 50 mL/hr at 05/30/24 1444 2 g at 05/30/24 1444   midazolam  (VERSED ) injection 1-2 mg  1-2 mg Intravenous Q1H PRN Paliwal, Ria, MD   2 mg at 05/30/24 9490   midodrine  (PROAMATINE ) tablet 5 mg  5 mg Oral TID WC Maree Harder, MD   5 mg at 05/30/24 1128   multivitamin with minerals tablet 1 tablet  1 tablet Oral Daily Paliwal, Ria, MD   1 tablet at 05/30/24 0917   [COMPLETED] nicotine  (NICODERM CQ  - dosed in mg/24 hours) patch 21 mg  21 mg Transdermal Once Plunkett, Whitney, MD   21 mg at 05/30/24 0917   nicotine  (NICODERM CQ  - dosed in  mg/24 hours) patch 21 mg  21 mg Transdermal Daily Maree Harder, MD   21 mg at 05/30/24 9081   ondansetron  (ZOFRAN ) injection 4 mg  4 mg Intravenous Q6H PRN Maree Harder, MD       oxyCODONE  (Oxy IR/ROXICODONE ) immediate release tablet 5 mg  5 mg Oral Q6H PRN Haze Led, MD   5 mg at 05/30/24 9294   pantoprazole  (PROTONIX ) EC tablet 40 mg  40 mg Oral Daily Paliwal, Led, MD   40 mg at 05/30/24 9082   predniSONE  (DELTASONE ) tablet 50 mg  50 mg Oral Q breakfast Paliwal, Led, MD   50 mg at 05/30/24 9183   rifaximin  (XIFAXAN ) tablet 550 mg  550 mg Oral BID Daren Ronnald BRAVO, NP   550 mg at 05/30/24 1443   thiamine  (VITAMIN B1) 200 mg in sodium chloride  0.9 % 50 mL IVPB  200 mg Intravenous Q24H Paliwal, Aditya, MD       Or   thiamine  (VITAMIN B1) tablet 100 mg  100 mg Oral Q24H Paliwal, Aditya, MD   100 mg at 05/30/24 9082    Allergies as of 05/29/2024 - Review Complete 05/29/2024  Allergen Reaction Noted   Chantix [varenicline]  09/06/2015   Lexapro [escitalopram]   09/06/2015    Social History   Socioeconomic History   Marital status: Single    Spouse name: Not on file   Number of children: Not on file   Years of education: Not on file   Highest education level: Not on file  Occupational History   Not on file  Tobacco Use   Smoking status: Every Day    Current packs/day: 1.50    Average packs/day: 1.5 packs/day for 15.0 years (22.5 ttl pk-yrs)    Types: Cigarettes    Passive exposure: Current   Smokeless tobacco: Never  Vaping Use   Vaping status: Never Used  Substance and Sexual Activity   Alcohol use: Not Currently    Alcohol/week: 14.0 standard drinks of alcohol    Types: 14 Shots of liquor per week    Comment: per day   Drug use: No   Sexual activity: Not on file  Other Topics Concern   Not on file  Social History Narrative   Not on file   Social Drivers of Health   Financial Resource Strain: Low Risk  (12/11/2020)   [facility] from [facility]   Overall Financial Resource Strain (CARDIA)    Difficulty of Paying Living Expenses: Not very hard  Food Insecurity: No Food Insecurity (12/18/2023)   Hunger Vital Sign    Worried About Running Out of Food in the Last Year: Never true    Ran Out of Food in the Last Year: Never true  Transportation Needs: No Transportation Needs (12/18/2023)   PRAPARE - Administrator, Civil Service (Medical): No    Lack of Transportation (Non-Medical): No  Physical Activity: Not on file  Stress: Not on file  Social Connections: Patient Unable To Answer (11/18/2023)   Social Connection and Isolation Panel    Frequency of Communication with Friends and Family: Patient unable to answer    Frequency of Social Gatherings with Friends and Family: Patient unable to answer    Attends Religious Services: Patient unable to answer    Active Member of Clubs or Organizations: Patient unable to answer    Attends Banker Meetings: Patient unable to answer    Marital Status: Patient  unable to answer  Intimate Partner  Violence: Not At Risk (12/18/2023)   Humiliation, Afraid, Rape, and Kick questionnaire    Fear of Current or Ex-Partner: No    Emotionally Abused: No    Physically Abused: No    Sexually Abused: No     Code Status   Code Status: Full Code  Review of Systems: All systems reviewed and negative except where noted in HPI.  Physical Exam: Vital signs in last 24 hours: Temp:  [98.1 F (36.7 C)-98.5 F (36.9 C)] 98.5 F (36.9 C) (08/24 0827) Pulse Rate:  [140-157] 148 (08/24 1400) Resp:  [14-28] 20 (08/24 1400) BP: (83-141)/(57-93) 107/85 (08/24 1400) SpO2:  [89 %-99 %] 89 % (08/24 1400) Weight:  [101.2 kg-102.5 kg] 101.2 kg (08/24 0400) Last BM Date : 05/30/24  General:  Pleasant male in NAD.  Family at bedside Psych:  Cooperative. Normal mood and affect Eyes: Pupils equal Ears:  Normal auditory acuity Nose: No deformity, discharge or lesions Neck:  Supple, no masses felt Lungs:  Clear to auscultation.  Heart:  Regular rate, regular rhythm.  Abdomen:  Soft, largely distended , nontender, active bowel sounds, no masses felt Rectal :  Deferred Msk: Symmetrical without gross deformities.  Neurologic:  Alert, oriented, grossly normal neurologically.  No asterixis Extremities : No edema Skin:  Intact without significant lesions.   Intake/Output from previous day: 08/23 0701 - 08/24 0700 In: 4647.8 [I.V.:1148.3; IV Piggyback:3499.5] Out: -  Intake/Output this shift:  Total I/O In: 700.5 [I.V.:679; IV Piggyback:21.6] Out: 350 [Urine:350]  Vina Dasen, NP-C   05/30/2024, 3:16 PM  I personally saw the patient and performed a substantive portion of this encounter (>50% time spent), including a complete performance of at least one of the key components (MDM, Hx and/or Exam), in conjunction with the APP.  I agree with the APP's note, impression, and recommendations with additional input as follows.   53 year old male with history of  alcoholic cirrhosis presented with shortness of breath, found to have a large right pleural effusion.  For the last 3 days he has been weak with some nausea, vomiting, and diarrhea.  EMS found him in the heart rate of the 180s and gave him 100 J shock.  He was also noted to be hypotensive upon arrival.  Unfortunately he has been drinking alcohol for the last month.  He had a diagnostic paracentesis done in the ED yesterday that was negative for SBP.  Upon arrival to the ED here, he is noted to have significantly elevated LFTs above his prior baseline, suggestive of decompensated alcoholic cirrhosis likely due to ongoing alcohol use or active infection.  Attempting to collect infectious stool studies to rule out infection since patient also has possible colitis on CT scan.  Chest x-ray does show some lung infiltrates in addition to his pleural effusion that could be concerning for pneumonia.  Urine was negative for infection.  Hypotension has improved slightly with albumin , midodrine , and IV fluids.  Here is currently being covered with antibiotics.  Plan is to proceed with large-volume paracentesis tomorrow.  If LVP does not help with his shortness of breath, plan is to proceed with thoracentesis after that. Suspect that his pleural effusion is most likely due to hepatic hydrothorax, though would need pleural fluid studies to be able to say for sure. Alternative etiology could be infection. Patient is on his home hepatic encephalopathy treatment with rifaximin .  He only describes some scant bright red blood per rectum that could be due to hemorrhoids and his hemoglobin remains  relatively stable, suggesting against significant GI bleed.  He has a mild AKI that has responded to fluids as well.  We will continue to monitor.  Estefana Kidney, MD

## 2024-05-30 NOTE — Progress Notes (Signed)
 NAME:  Omar Turner, MRN:  [id number], DOB:  04-Dec-1970, LOS: 0 ADMISSION DATE:  05/29/2024, CONSULTATION DATE:  05/30/2024 REFERRING MD:  Benton Shone, MD, CHIEF COMPLAINT:  SOB  History of Present Illness:  53 y/o male with PMH for Alcoholic Cirrhosis, Esophageal Varices s/p clipping, COPD, active drinker (1 pint Vodka daily) and smoker (1ppd), Ascites s/p multiple paracentesis, DT's, Withdrawal Seizures, recent MRI abd showing right pleural effusion.  He presented with SOB today.  He says for the last 3 days he has been very weak, not able to get out of bed much.  He has been having N/V/D.  He cannot hold much down and has not taken his meds in a couple days as a result.  He is more SOB and has a dry cough and has been wheezing.  He has an Albuterol  inhaler which he uses prn.  He also has abd pain throughout.  Apparently he was sitting on his porch and found himself to be light headed, almost passing out and fast heart rate.  EMS found him with HR 180's and gave him a 100J shock.  His HR decreased to the 160's.  He remained hypotensive.  His LA was 7.83.  A paracentesis was done in the ED and he had a paracentesis 1 week ago outpatient with about 2 liters removed.  Pertinent  Medical History  Alcoholic Cirrhosis, Esophageal Varices s/p clipping, COPD, active drinker (1 pint Vodka daily) and smoker (1ppd), Ascites s/p multiple paracentesis, DT's, Withdrawal Seizures, recent MRI abd showing right pleural effusion.    Significant Hospital Events: Including procedures, antibiotic start and stop dates in addition to other pertinent events   8/24: Admit to ICU  Interim History / Subjective:  BP low 100s   Objective    Blood pressure 102/74, pulse (!) 155, temperature 98.5 F (36.9 C), temperature source Oral, resp. rate (!) 22, height 5' 11 (1.803 m), weight 101.2 kg, SpO2 90%.        Intake/Output Summary (Last 24 hours) at 05/30/2024 1322 Last data filed at 05/30/2024 1200 Gross per 24  hour  Intake 5348.33 ml  Output --  Net 5348.33 ml   Filed Weights   05/29/24 1852 05/30/24 0400  Weight: 102.5 kg 101.2 kg    Examination: General: Chronically and acutely ill M HENT: NCAT Icteric sclera pink mm  Lungs: Diminished on R, clear on L Cardiovascular: tachycardic cap refill < 3 sec  Abdomen: round soft  Extremities: no lower extremity edema, no obvious joint deformity  Neuro: AAOx4 GU: defer   Resolved problem list   Assessment and Plan   Acute hypoxic resp failure R pleural effusion -favor hepatic hydrothorax  L CAP P -CAP coverage, send RVP  -think symptomatically para would be place to start when we have room hemodynamically instead of thora  -d/w MD -- low suspicion for infection in pleural space so defer thora unless resp sx require intervention -O2 for goal > 90  SVT (was shock x100J w EMS without much improvement it sounds like) Prolonged qtc -replace lytes -repeat EKG  -dont think BP would tolerate beta blocker -- probably just need to try adenosine   Decompensated alcoholic cirrhosis with recurrent ascites  Alcoholic hepatitis  Portal hypertensive gastropathy  Hx esophageal varices -associated hyperammonemia, hyperbilirubinemia, coagulopathy, thrombocytopenia, elevated LFTs  -MELD 3 32 -MDF 68.6 (using Pt control 15) -Dx para in ED didn't look c/w SBP -actively drinking since about March, down to 1 pint of vodka/d  P -page  out to our GI for consultation -will reach out to his team at [facility] but in light of active drinking he wouldn't be a candidate  -agree w steroids, octreotide , albumin . On LR mIVF  -restarting his home xifaxin.  -Lactulose  ordered as per home med rec view -- in [facility] note looks like different dose/fq however, asking PharmD to help clarify  -home diuretics on hold w his pressures  -will need a para -- would like to try to get him in a better spot hemodynamically before therapeutic para  -follow CBC coags  -holding chem  VTE ppx w plt 50.   AKI  Hypomag HypoNa -HRS v prerenal in setting of poor intake + diarrhea  P -agree w current plan for albumin   -follow renal indices UOP  -replace mag aggressively   Hypotension -multifactorial-- SVT, decomp liver ? Sepsis, GI losses/poor intake P -albumin , midodrine  -CAP coverage  ?Colitis -GI panel   EtOH abuse -will need CIWA monitoring + micronutrient support   GOC -very much seeking full scope of any and all tx modalities to stay alive  -low threshold for palli consultation if he isnt improving in the coming days    Best Practice (right click and Reselect all SmartList Selections daily)   Diet/type: Regular consistency (see orders) DVT prophylaxis SCD Pressure ulcer(s): N/A GI prophylaxis: PPI Lines: N/A Foley:  N/A Code Status:  full code   Labs   CBC: Recent Labs  Lab 05/29/24 1826 05/29/24 1835 05/30/24 0433 05/30/24 0530  WBC 9.7  --  13.2* 12.9*  NEUTROABS 7.5  --   --   --   HGB 13.2 13.6 12.1* 12.0*  HCT 37.3* 40.0 33.5* 33.6*  MCV 99.2  --  96.5 97.1  PLT 57*  --  57* 50*    Basic Metabolic Panel: Recent Labs  Lab 05/29/24 1826 05/29/24 1835 05/30/24 0433 05/30/24 0530  NA 127* 130* 128* 129*  K 3.3* 3.3* 4.0 3.6  CL 88*  --  93* 94*  CO2 17*  --  18* 19*  GLUCOSE 77  --  70 89  BUN 11  --  10 9  CREATININE 1.60*  --  1.16 1.26*  CALCIUM  8.0*  --  8.4* 8.5*  MG  --   --  1.2*  --    GFR: Estimated Creatinine Clearance: 84.1 mL/min (A) (by C-G formula based on SCr of 1.26 mg/dL (H)). Recent Labs  Lab 05/29/24 1826 05/29/24 1835 05/29/24 2132 05/30/24 0049 05/30/24 0433 05/30/24 0530  WBC 9.7  --   --   --  13.2* 12.9*  LATICACIDVEN  --  7.9* 7.8* 5.9*  --  6.8*    Liver Function Tests: Recent Labs  Lab 05/29/24 1826 05/30/24 0530  AST 326* 464*  ALT 106* 142*  ALKPHOS 164* 137*  BILITOT 19.7* 22.1*  PROT 7.3 7.0  ALBUMIN  2.1* 2.1*   Recent Labs  Lab 05/29/24 1826  LIPASE 57*    Recent Labs  Lab 05/29/24 1827 05/30/24 0530  AMMONIA 29 53*    ABG    Component Value Date/Time   HCO3 22.3 05/29/2024 1835   TCO2 23 05/29/2024 1835   ACIDBASEDEF 1.0 05/29/2024 1835   O2SAT 65 05/29/2024 1835     Coagulation Profile: Recent Labs  Lab 05/29/24 1826 05/30/24 0530  INR 2.1* 2.2*    Cardiac Enzymes: No results for input(s): CKTOTAL, CKMB, CKMBINDEX, TROPONINI in the last 168 hours.  HbA1C: Hgb A1c MFr Bld  Date/Time Value  Ref Range Status  05/30/2024 04:33 AM 3.8 (L) 4.8 - 5.6 % Final    Comment:    (NOTE) Diagnosis of Diabetes The following HbA1c ranges recommended by the American Diabetes Association (ADA) may be used as an aid in the diagnosis of diabetes mellitus.  Hemoglobin             Suggested A1C NGSP%              Diagnosis  <5.7                   Non Diabetic  5.7-6.4                Pre-Diabetic  >6.4                   Diabetic  <7.0                   Glycemic control for                       adults with diabetes.      CBG: Recent Labs  Lab 05/30/24 0325 05/30/24 0413 05/30/24 0439 05/30/24 0826 05/30/24 1150  GLUCAP 63* 64* 78 100* 98    CRITICAL CARE Performed by: Ronnald FORBES Gave   Total critical care time: 70 min  Critical care time was exclusive of separately billable procedures and treating other patients. Critical care was necessary to treat or prevent imminent or life-threatening deterioration.  Critical care was time spent personally by me on the following activities: development of treatment plan with patient and/or surrogate as well as nursing, discussions with consultants, evaluation of patient's response to treatment, examination of patient, obtaining history from patient or surrogate, ordering and performing treatments and interventions, ordering and review of laboratory studies, ordering and review of radiographic studies, pulse oximetry and re-evaluation of patient's condition.  Ronnald Gave MSN, AGACNP-BC [place] [place] [facility] Amion for pager 05/30/2024, 1:22 PM

## 2024-05-30 NOTE — ED Notes (Signed)
Critical care rounding at bedside 

## 2024-05-30 NOTE — Progress Notes (Signed)
 eLink Physician-Brief Progress Note Patient Name: Omar Turner DOB: 1971/01/01 MRN: [id number]   Date of Service  05/30/2024  HPI/Events of Note  53 year old male with a history of alcoholic cirrhosis, alcohol use disorder, and portal hypertension was found to have heart rate in the 180s, received 200 J shock and admitted to the ICU for management of SVT and decompensated cirrhosis concerning for pneumonia versus aspiration.  Decompensated with ascites, acute kidney injury, pleural effusion, and transaminitis/coagulopathy /thrombocytopenia.  MELD 3 of 34 points (52.3 % 3 month mortality).  Not a candidate for transplantation in the setting of active alcohol use.  Given alcoholic hepatitis component, discriminant factor 65.7 (>32 points indicates poor prognosis and patient may benefit from glucocorticoid therapy).  Complaining of anxiety and generalized pain.  Vital signs consistent with tachycardia, tachypnea, and hypoxia saturating 92% on 2 L of oxygen.  Results show hyperglycemia, hypokalemia, anion gap metabolic acidosis, elevated creatinine and transaminitis.  Lactic acid somewhat downtrending now at 5.9.  eICU Interventions  Add CIWA protocol.  Add scheduled prednisone .  Add scheduled albumin  and octreotide  for likely HRS.  Consider discontinuation of crystalloid infusion especially in the setting of suspected hepatic hydrothorax.  Maintain midodrine .  Prophylactic ceftriaxone  in place  Anticipate thoracentesis with IR in the morning  Recommend palliative care evaluation in the morning given high risk of mortality.  As needed analgesia with oxycodone   DVT prophylaxis with SCDs, consider addition of subcutaneous heparin  GI prophylaxis not indicated, home PPI     Intervention Category Evaluation Type: New Patient Evaluation  Alyan Hartline 05/30/2024, 4:30 AM

## 2024-05-31 ENCOUNTER — Inpatient Hospital Stay (HOSPITAL_COMMUNITY)

## 2024-05-31 ENCOUNTER — Ambulatory Visit: Admission: RE | Admit: 2024-05-31 | Source: Ambulatory Visit

## 2024-05-31 DIAGNOSIS — J9601 Acute respiratory failure with hypoxia: Secondary | ICD-10-CM

## 2024-05-31 DIAGNOSIS — J90 Pleural effusion, not elsewhere classified: Secondary | ICD-10-CM | POA: Diagnosis not present

## 2024-05-31 DIAGNOSIS — R6520 Severe sepsis without septic shock: Secondary | ICD-10-CM

## 2024-05-31 DIAGNOSIS — K7011 Alcoholic hepatitis with ascites: Secondary | ICD-10-CM

## 2024-05-31 DIAGNOSIS — K7031 Alcoholic cirrhosis of liver with ascites: Secondary | ICD-10-CM | POA: Diagnosis not present

## 2024-05-31 DIAGNOSIS — E871 Hypo-osmolality and hyponatremia: Secondary | ICD-10-CM

## 2024-05-31 DIAGNOSIS — A419 Sepsis, unspecified organism: Secondary | ICD-10-CM | POA: Diagnosis not present

## 2024-05-31 DIAGNOSIS — E872 Acidosis, unspecified: Secondary | ICD-10-CM

## 2024-05-31 DIAGNOSIS — R008 Other abnormalities of heart beat: Secondary | ICD-10-CM

## 2024-05-31 DIAGNOSIS — K7290 Hepatic failure, unspecified without coma: Secondary | ICD-10-CM

## 2024-05-31 DIAGNOSIS — E7220 Disorder of urea cycle metabolism, unspecified: Secondary | ICD-10-CM

## 2024-05-31 DIAGNOSIS — N179 Acute kidney failure, unspecified: Secondary | ICD-10-CM | POA: Diagnosis not present

## 2024-05-31 DIAGNOSIS — I959 Hypotension, unspecified: Secondary | ICD-10-CM | POA: Diagnosis not present

## 2024-05-31 DIAGNOSIS — J189 Pneumonia, unspecified organism: Secondary | ICD-10-CM | POA: Diagnosis not present

## 2024-05-31 LAB — ECHOCARDIOGRAM COMPLETE
Area-P 1/2: 10.54 cm2
Height: 71 in
S' Lateral: 3.1 cm
Weight: 3703.73 [oz_av]

## 2024-05-31 LAB — COMPREHENSIVE METABOLIC PANEL WITH GFR
ALT: 140 U/L — ABNORMAL HIGH (ref 0–44)
AST: 407 U/L — ABNORMAL HIGH (ref 15–41)
Albumin: 2.6 g/dL — ABNORMAL LOW (ref 3.5–5.0)
Alkaline Phosphatase: 124 U/L (ref 38–126)
Anion gap: 9 (ref 5–15)
BUN: 13 mg/dL (ref 6–20)
CO2: 26 mmol/L (ref 22–32)
Calcium: 8.6 mg/dL — ABNORMAL LOW (ref 8.9–10.3)
Chloride: 94 mmol/L — ABNORMAL LOW (ref 98–111)
Creatinine, Ser: 1 mg/dL (ref 0.61–1.24)
GFR, Estimated: >60 mL/min (ref >60)
Glucose, Bld: 118 mg/dL — ABNORMAL HIGH (ref 70–99)
Potassium: 3.6 mmol/L (ref 3.5–5.1)
Sodium: 129 mmol/L — ABNORMAL LOW (ref 135–145)
Total Bilirubin: 25.7 mg/dL (ref 0.0–1.2)
Total Protein: 7 g/dL (ref 6.5–8.1)

## 2024-05-31 LAB — GASTROINTESTINAL PANEL BY PCR, STOOL (REPLACES STOOL CULTURE)

## 2024-05-31 LAB — BASIC METABOLIC PANEL WITH GFR
Anion gap: 12 (ref 5–15)
BUN: 12 mg/dL (ref 6–20)
CO2: 26 mmol/L (ref 22–32)
Calcium: 8.8 mg/dL — ABNORMAL LOW (ref 8.9–10.3)
Chloride: 92 mmol/L — ABNORMAL LOW (ref 98–111)
Creatinine, Ser: 0.79 mg/dL (ref 0.61–1.24)
GFR, Estimated: >60 mL/min (ref >60)
Glucose, Bld: 129 mg/dL — ABNORMAL HIGH (ref 70–99)
Potassium: 3.8 mmol/L (ref 3.5–5.1)
Sodium: 130 mmol/L — ABNORMAL LOW (ref 135–145)

## 2024-05-31 LAB — POCT I-STAT 7, (LYTES, BLD GAS, ICA,H+H)
Acid-Base Excess: 6 mmol/L — ABNORMAL HIGH (ref 0.0–2.0)
Acid-Base Excess: 5 mmol/L — ABNORMAL HIGH (ref 0.0–2.0)
Acid-Base Excess: 5 mmol/L — ABNORMAL HIGH (ref 0.0–2.0)
Bicarbonate: 30.4 mmol/L — ABNORMAL HIGH (ref 20.0–28.0)
Bicarbonate: 31.9 mmol/L — ABNORMAL HIGH (ref 20.0–28.0)
Bicarbonate: 29.4 mmol/L — ABNORMAL HIGH (ref 20.0–28.0)
Calcium, Ion: 1.13 mmol/L — ABNORMAL LOW (ref 1.15–1.40)
Calcium, Ion: 1.13 mmol/L — ABNORMAL LOW (ref 1.15–1.40)
Calcium, Ion: 1.09 mmol/L — ABNORMAL LOW (ref 1.15–1.40)
HCT: 32 % — ABNORMAL LOW (ref 39.0–52.0)
HCT: 37 % — ABNORMAL LOW (ref 39.0–52.0)
HCT: 31 % — ABNORMAL LOW (ref 39.0–52.0)
Hemoglobin: 10.9 g/dL — ABNORMAL LOW (ref 13.0–17.0)
Hemoglobin: 12.6 g/dL — ABNORMAL LOW (ref 13.0–17.0)
Hemoglobin: 10.5 g/dL — ABNORMAL LOW (ref 13.0–17.0)
O2 Saturation: 95 %
O2 Saturation: 86 %
O2 Saturation: 99 %
Patient temperature: 98.2
Patient temperature: 98.2
Patient temperature: 98.9
Potassium: 4 mmol/L (ref 3.5–5.1)
Potassium: 4.1 mmol/L (ref 3.5–5.1)
Potassium: 4.5 mmol/L (ref 3.5–5.1)
Sodium: 131 mmol/L — ABNORMAL LOW (ref 135–145)
Sodium: 132 mmol/L — ABNORMAL LOW (ref 135–145)
Sodium: 129 mmol/L — ABNORMAL LOW (ref 135–145)
TCO2: 32 mmol/L (ref 22–32)
TCO2: 34 mmol/L — ABNORMAL HIGH (ref 22–32)
TCO2: 31 mmol/L (ref 22–32)
pCO2 arterial: 44.1 mmHg (ref 32–48)
pCO2 arterial: 53.4 mmHg — ABNORMAL HIGH (ref 32–48)
pCO2 arterial: 41.8 mmHg (ref 32–48)
pH, Arterial: 7.445 (ref 7.35–7.45)
pH, Arterial: 7.383 (ref 7.35–7.45)
pH, Arterial: 7.456 — ABNORMAL HIGH (ref 7.35–7.45)
pO2, Arterial: 73 mmHg — ABNORMAL LOW (ref 83–108)
pO2, Arterial: 52 mmHg — ABNORMAL LOW (ref 83–108)
pO2, Arterial: 158 mmHg — ABNORMAL HIGH (ref 83–108)

## 2024-05-31 LAB — PROTIME-INR
INR: 2.7 — ABNORMAL HIGH (ref 0.8–1.2)
Prothrombin Time: 29.9 s — ABNORMAL HIGH (ref 11.4–15.2)

## 2024-05-31 LAB — CBC
HCT: 30.1 % — ABNORMAL LOW (ref 39.0–52.0)
Hemoglobin: 11.1 g/dL — ABNORMAL LOW (ref 13.0–17.0)
MCH: 35.8 pg — ABNORMAL HIGH (ref 26.0–34.0)
MCHC: 36.9 g/dL — ABNORMAL HIGH (ref 30.0–36.0)
MCV: 97.1 fL (ref 80.0–100.0)
Platelets: 46 K/uL — ABNORMAL LOW (ref 150–400)
RBC: 3.1 MIL/uL — ABNORMAL LOW (ref 4.22–5.81)
RDW: 16.8 % — ABNORMAL HIGH (ref 11.5–15.5)
WBC: 15.6 K/uL — ABNORMAL HIGH (ref 4.0–10.5)
nRBC: 0 % (ref 0.0–0.2)

## 2024-05-31 LAB — GLUCOSE, CAPILLARY
Glucose-Capillary: 113 mg/dL — ABNORMAL HIGH (ref 70–99)
Glucose-Capillary: 117 mg/dL — ABNORMAL HIGH (ref 70–99)
Glucose-Capillary: 128 mg/dL — ABNORMAL HIGH (ref 70–99)
Glucose-Capillary: 129 mg/dL — ABNORMAL HIGH (ref 70–99)
Glucose-Capillary: 136 mg/dL — ABNORMAL HIGH (ref 70–99)
Glucose-Capillary: 141 mg/dL — ABNORMAL HIGH (ref 70–99)
Glucose-Capillary: 147 mg/dL — ABNORMAL HIGH (ref 70–99)
Glucose-Capillary: 147 mg/dL — ABNORMAL HIGH (ref 70–99)

## 2024-05-31 LAB — MAGNESIUM: Magnesium: 2.2 mg/dL (ref 1.7–2.4)

## 2024-05-31 LAB — AMMONIA: Ammonia: 59 umol/L — ABNORMAL HIGH (ref 9–35)

## 2024-05-31 LAB — LACTIC ACID, PLASMA
Lactic Acid, Venous: 2.3 mmol/L (ref 0.5–1.9)
Lactic Acid, Venous: 2.6 mmol/L (ref 0.5–1.9)

## 2024-05-31 LAB — BRAIN NATRIURETIC PEPTIDE: B Natriuretic Peptide: 379.8 pg/mL — ABNORMAL HIGH (ref 0.0–100.0)

## 2024-05-31 LAB — PATHOLOGIST SMEAR REVIEW: Path Review: REACTIVE

## 2024-05-31 MED ORDER — FENTANYL CITRATE PF 50 MCG/ML IJ SOSY: 50.0000 ug | PREFILLED_SYRINGE | Freq: Once | INTRAMUSCULAR | Status: AC

## 2024-05-31 MED ORDER — FENTANYL BOLUS VIA INFUSION
25.0000 ug | INTRAVENOUS | Status: DC | PRN
  Administered 2024-06-01 (×3): 50 ug via INTRAVENOUS
  Administered 2024-06-01 – 2024-06-02 (×4): 100 ug via INTRAVENOUS
  Administered 2024-06-04 (×2): 25 ug via INTRAVENOUS

## 2024-05-31 MED ORDER — ETOMIDATE 2 MG/ML IV SOLN: 20.0000 mg | Freq: Once | INTRAVENOUS | Status: AC

## 2024-05-31 MED ORDER — SUCCINYLCHOLINE CHLORIDE 200 MG/10ML IV SOSY
PREFILLED_SYRINGE | INTRAVENOUS | Status: AC
  Filled 2024-05-31: qty 10

## 2024-05-31 MED ORDER — SODIUM CHLORIDE 0.9 % IV SOLN
100.0000 mg | Freq: Two times a day (BID) | INTRAVENOUS | Status: AC
  Administered 2024-05-31 – 2024-06-05 (×12): 100 mg via INTRAVENOUS
  Filled 2024-05-31 (×13): qty 100

## 2024-05-31 MED ORDER — POLYETHYLENE GLYCOL 3350 17 G PO PACK
17.0000 g | PACK | Freq: Every day | ORAL | Status: DC
  Administered 2024-05-31 – 2024-06-01 (×2): 17 g
  Filled 2024-05-31 (×2): qty 1

## 2024-05-31 MED ORDER — SODIUM CHLORIDE 0.9 % IV SOLN: 250.0000 mL | INTRAVENOUS | Status: AC

## 2024-05-31 MED ORDER — POTASSIUM CHLORIDE 10 MEQ/100ML IV SOLN
10.0000 meq | INTRAVENOUS | Status: AC
  Administered 2024-06-01 (×2): 10 meq via INTRAVENOUS
  Filled 2024-05-31 (×2): qty 100

## 2024-05-31 MED ORDER — DOCUSATE SODIUM 50 MG/5ML PO LIQD
100.0000 mg | Freq: Two times a day (BID) | ORAL | Status: DC
  Administered 2024-05-31 – 2024-06-02 (×5): 100 mg
  Filled 2024-05-31 (×5): qty 10

## 2024-05-31 MED ORDER — PANTOPRAZOLE SODIUM 40 MG IV SOLR
40.0000 mg | INTRAVENOUS | Status: DC
  Administered 2024-05-31 – 2024-06-07 (×8): 40 mg via INTRAVENOUS
  Filled 2024-05-31 (×8): qty 10

## 2024-05-31 MED ORDER — LACTULOSE 10 GM/15ML PO SOLN
20.0000 g | Freq: Four times a day (QID) | ORAL | Status: DC
  Administered 2024-05-31 – 2024-06-01 (×3): 20 g
  Filled 2024-05-31 (×3): qty 30

## 2024-05-31 MED ORDER — ALBUMIN HUMAN 25 % IV SOLN
25.0000 g | Freq: Four times a day (QID) | INTRAVENOUS | Status: AC
  Administered 2024-05-31 (×2): 25 g via INTRAVENOUS
  Filled 2024-05-31 (×2): qty 100

## 2024-05-31 MED ORDER — PHYTONADIONE 5 MG PO TABS
10.0000 mg | ORAL_TABLET | Freq: Once | ORAL | Status: AC
  Administered 2024-05-31: 10 mg via ORAL
  Filled 2024-05-31: qty 2

## 2024-05-31 MED ORDER — FENTANYL 2500MCG IN NS 250ML (10MCG/ML) PREMIX INFUSION
25.0000 ug/h | INTRAVENOUS | Status: DC
  Administered 2024-05-31: 100 ug/h via INTRAVENOUS
  Administered 2024-06-01: 150 ug/h via INTRAVENOUS
  Administered 2024-06-02: 300 ug/h via INTRAVENOUS
  Administered 2024-06-02: 200 ug/h via INTRAVENOUS
  Administered 2024-06-02: 300 ug/h via INTRAVENOUS
  Administered 2024-06-03: 275 ug/h via INTRAVENOUS
  Administered 2024-06-03: 300 ug/h via INTRAVENOUS
  Administered 2024-06-03: 275 ug/h via INTRAVENOUS
  Administered 2024-06-04: 300 ug/h via INTRAVENOUS
  Administered 2024-06-04: 250 ug/h via INTRAVENOUS
  Administered 2024-06-05: 275 ug/h via INTRAVENOUS
  Administered 2024-06-05: 200 ug/h via INTRAVENOUS
  Administered 2024-06-06: 100 ug/h via INTRAVENOUS
  Administered 2024-06-07: 75 ug/h via INTRAVENOUS
  Filled 2024-05-31 (×14): qty 250

## 2024-05-31 MED ORDER — ETOMIDATE 2 MG/ML IV SOLN
INTRAVENOUS | Status: AC
  Administered 2024-05-31: 20 mg via INTRAVENOUS
  Filled 2024-05-31: qty 20

## 2024-05-31 MED ORDER — ROCURONIUM BROMIDE 10 MG/ML (PF) SYRINGE: 100.0000 mg | PREFILLED_SYRINGE | Freq: Once | INTRAVENOUS | Status: AC

## 2024-05-31 MED ORDER — POTASSIUM CHLORIDE 10 MEQ/100ML IV SOLN
10.0000 meq | INTRAVENOUS | Status: AC
  Administered 2024-05-31 (×2): 10 meq via INTRAVENOUS
  Filled 2024-05-31 (×2): qty 100

## 2024-05-31 MED ORDER — ALBUMIN HUMAN 25 % IV SOLN
25.0000 g | Freq: Once | INTRAVENOUS | Status: AC
  Administered 2024-05-31: 25 g via INTRAVENOUS
  Filled 2024-05-31: qty 100

## 2024-05-31 MED ORDER — LACTULOSE ENEMA
300.0000 mL | Freq: Once | ORAL | Status: DC
  Filled 2024-05-31: qty 300

## 2024-05-31 MED ORDER — PROPOFOL 1000 MG/100ML IV EMUL
0.0000 ug/kg/min | INTRAVENOUS | Status: DC
  Administered 2024-05-31 (×2): 10 ug/kg/min via INTRAVENOUS
  Administered 2024-06-01: 40 ug/kg/min via INTRAVENOUS
  Administered 2024-06-01 (×2): 20 ug/kg/min via INTRAVENOUS
  Administered 2024-06-01: 15 ug/kg/min via INTRAVENOUS
  Administered 2024-06-02: 40 ug/kg/min via INTRAVENOUS
  Administered 2024-06-02: 60 ug/kg/min via INTRAVENOUS
  Administered 2024-06-02: 50 ug/kg/min via INTRAVENOUS
  Administered 2024-06-02: 60 ug/kg/min via INTRAVENOUS
  Filled 2024-05-31 (×10): qty 100

## 2024-05-31 MED ORDER — MIDAZOLAM HCL 2 MG/2ML IJ SOLN
INTRAMUSCULAR | Status: AC
  Filled 2024-05-31: qty 2

## 2024-05-31 MED ORDER — MIDAZOLAM HCL 2 MG/2ML IJ SOLN
1.0000 mg | INTRAMUSCULAR | Status: DC | PRN
  Administered 2024-05-31 – 2024-06-05 (×8): 2 mg via INTRAVENOUS
  Filled 2024-05-31 (×10): qty 2

## 2024-05-31 MED ORDER — PIPERACILLIN-TAZOBACTAM 3.375 G IVPB
3.3750 g | Freq: Three times a day (TID) | INTRAVENOUS | Status: DC
  Administered 2024-05-31 – 2024-06-05 (×14): 3.375 g via INTRAVENOUS
  Filled 2024-05-31 (×14): qty 50

## 2024-05-31 MED ORDER — ROCURONIUM BROMIDE 10 MG/ML (PF) SYRINGE
PREFILLED_SYRINGE | INTRAVENOUS | Status: AC
  Administered 2024-05-31: 100 mg via INTRAVENOUS
  Filled 2024-05-31: qty 10

## 2024-05-31 MED ORDER — PIPERACILLIN-TAZOBACTAM 3.375 G IVPB 30 MIN
3.3750 g | Freq: Once | INTRAVENOUS | Status: AC
  Administered 2024-05-31: 3.375 g via INTRAVENOUS
  Filled 2024-05-31: qty 50

## 2024-05-31 MED ORDER — CALCIUM GLUCONATE-NACL 1-0.675 GM/50ML-% IV SOLN
1.0000 g | Freq: Once | INTRAVENOUS | Status: AC
  Administered 2024-05-31: 1000 mg via INTRAVENOUS
  Filled 2024-05-31: qty 50

## 2024-05-31 MED ORDER — POTASSIUM CHLORIDE 10 MEQ/100ML IV SOLN
10.0000 meq | INTRAVENOUS | Status: AC
  Administered 2024-05-31 (×4): 10 meq via INTRAVENOUS
  Filled 2024-05-31 (×4): qty 100

## 2024-05-31 MED ORDER — KETAMINE HCL 50 MG/5ML IJ SOSY
PREFILLED_SYRINGE | INTRAMUSCULAR | Status: AC
  Filled 2024-05-31: qty 10

## 2024-05-31 MED ORDER — FUROSEMIDE 10 MG/ML IJ SOLN
40.0000 mg | Freq: Four times a day (QID) | INTRAMUSCULAR | Status: DC
  Administered 2024-05-31 – 2024-06-01 (×3): 40 mg via INTRAVENOUS
  Filled 2024-05-31 (×3): qty 4

## 2024-05-31 MED ORDER — MIDODRINE HCL 5 MG PO TABS
10.0000 mg | ORAL_TABLET | Freq: Three times a day (TID) | ORAL | Status: DC
  Administered 2024-06-01 – 2024-06-03 (×7): 10 mg via ORAL
  Filled 2024-05-31 (×7): qty 2

## 2024-05-31 MED ORDER — FENTANYL CITRATE PF 50 MCG/ML IJ SOSY
PREFILLED_SYRINGE | INTRAMUSCULAR | Status: AC
  Administered 2024-05-31: 50 ug via INTRAVENOUS
  Filled 2024-05-31: qty 2

## 2024-05-31 MED ORDER — PHENYLEPHRINE 80 MCG/ML (10ML) SYRINGE FOR IV PUSH (FOR BLOOD PRESSURE SUPPORT)
PREFILLED_SYRINGE | INTRAVENOUS | Status: AC
  Filled 2024-05-31: qty 10

## 2024-05-31 MED ORDER — LACTULOSE 10 GM/15ML PO SOLN
20.0000 g | Freq: Two times a day (BID) | ORAL | Status: DC
  Administered 2024-05-31: 20 g via ORAL
  Filled 2024-05-31: qty 30

## 2024-05-31 MED ORDER — FUROSEMIDE 10 MG/ML IJ SOLN
40.0000 mg | Freq: Once | INTRAMUSCULAR | Status: AC
  Administered 2024-05-31: 40 mg via INTRAVENOUS
  Filled 2024-05-31: qty 4

## 2024-05-31 MED ORDER — FENTANYL CITRATE PF 50 MCG/ML IJ SOSY
25.0000 ug | PREFILLED_SYRINGE | Freq: Once | INTRAMUSCULAR | Status: DC
  Filled 2024-05-31: qty 1

## 2024-05-31 MED ORDER — NOREPINEPHRINE 4 MG/250ML-% IV SOLN
0.0000 ug/min | INTRAVENOUS | Status: DC
  Administered 2024-05-31: 2 ug/min via INTRAVENOUS
  Administered 2024-06-01: 10 ug/min via INTRAVENOUS
  Filled 2024-05-31 (×2): qty 250

## 2024-05-31 NOTE — Progress Notes (Signed)
  Echocardiogram 2D Echocardiogram has been performed.  Koleen KANDICE Popper, RDCS 05/31/2024, 12:54 PM

## 2024-05-31 NOTE — Progress Notes (Signed)
 Pharmacy Antibiotic Note  Omar Turner is a 53 y.o. male admitted on 05/29/2024 with pneumonia.  Pharmacy has been consulted for zosyn  dosing.   Pt with decompensated liver dz who was admitted for short of breath. Zosyn  has been ordered empirically for asp PNA.  Crcl>30 Wbc 16k LA down 2.3  Plan: Zosyn  3.375g IV x1 then q8 extended infusion  Height: 5' 11 (180.3 cm) Weight: 105 kg (231 lb 7.7 oz) IBW/kg (Calculated) : 75.3  Temp (24hrs), Avg:98.3 F (36.8 C), Min:98.2 F (36.8 C), Max:98.3 F (36.8 C)  Recent Labs  Lab 05/29/24 1826 05/29/24 1835 05/29/24 2132 05/30/24 0049 05/30/24 0433 05/30/24 0530 05/31/24 0459  WBC 9.7  --   --   --  13.2* 12.9* 15.6*  CREATININE 1.60*  --   --   --  1.16 1.26* 1.00  LATICACIDVEN  --  7.9* 7.8* 5.9*  --  6.8* 2.3*    Estimated Creatinine Clearance: 107.8 mL/min (by C-G formula based on SCr of 1 mg/dL).    Allergies  Allergen Reactions   Chantix [Varenicline] Other (See Comments)    Gave patient nightmares.   Lexapro [Escitalopram] Other (See Comments)    Made him feel weird.     Antimicrobials this admission: Ceftriaxone  8/24 x1 Azithromycin  8/24 x1 Doxycycline  8/25 >> Zosyn  8/25>>  Dose adjustments this admission:   Microbiology results: 8/24 Strep UA >>neg 8/24 Legionella UA >> 8/24 GI panel >>pending 8/24 Resp viral panel >>neg 8/23 MRSA PCR negative  8/23 BCx >>ngtd 8/23 Peritoneal fluid >> ngtd  Sergio Batch, PharmD, [place], AAHIVP, CPP Infectious Disease Pharmacist 05/31/2024 11:43 AM

## 2024-05-31 NOTE — Procedures (Signed)
 Intubation Procedure Note  TAREN TOOPS  [id number]  1971/08/23  Date:05/31/24  Time:4:46 PM   Provider Performing:Yao Hyppolite LOISE Blush    Procedure: Intubation (31500)  Indication(s) Respiratory Failure  Consent Risks of the procedure as well as the alternatives and risks of each were explained to the patient and/or caregiver.  Consent for the procedure was obtained and is signed in the bedside chart   Anesthesia Etomidate  and Rocuronium    Time Out Verified patient identification, verified procedure, site/side was marked, verified correct patient position, special equipment/implants available, medications/allergies/relevant history reviewed, required imaging and test results available.   Sterile Technique Usual hand hygeine, masks, and gloves were used   Procedure Description Patient positioned in bed supine.  Sedation given as noted above.  Patient was intubated with endotracheal tube using Glidescope.  View was Grade 2/3 with cricoid pressure grade 1.  Number of attempts was 1.  Colorimetric CO2 detector was consistent with tracheal placement.   Complications/Tolerance None; patient tolerated the procedure well. Chest X-ray is ordered to verify placement.   EBL Minimal   Specimen(s) None  Assisted by RODGER Cedar, PA-C.  Rexene LOISE Blush, PA-C [facility] 05/31/24 4:47 PM  Please see [email or web address] for pager details.  From 7A-7P if no response, please call [phone] After hours, please call ELink [phone]

## 2024-05-31 NOTE — Procedures (Signed)
 Central Venous Catheter Insertion Procedure Note  Omar Turner  [id number]  April 22, 1971  Date:05/31/24  Time:9:44 PM   Provider Performing:Livian Vanderbeck Meade   Procedure: Insertion of Non-tunneled Central Venous Catheter(36556) with US  guidance (23062)   Indication(s) Medication administration and Difficult access  Consent Unable to obtain consent due to emergent nature of procedure.  Anesthesia Topical only with 1% lidocaine    Timeout Verified patient identification, verified procedure, site/side was marked, verified correct patient position, special equipment/implants available, medications/allergies/relevant history reviewed, required imaging and test results available.  Sterile Technique Maximal sterile technique including full sterile barrier drape, hand hygiene, sterile gown, sterile gloves, mask, hair covering, sterile ultrasound probe cover (if used).  Procedure Description Area of catheter insertion was cleaned with chlorhexidine  and draped in sterile fashion.  With real-time ultrasound guidance a central venous catheter was placed into the left internal jugular vein. Nonpulsatile blood flow and easy flushing noted in all ports.  The catheter was sutured in place and sterile dressing applied.  Complications/Tolerance None; patient tolerated the procedure well. Chest X-ray is ordered to verify placement for internal jugular or subclavian cannulation.   Chest x-ray is not ordered for femoral cannulation.  EBL Minimal  Specimen(s) None   Sammi Meade, PA - C [facility] For pager details, please see AMION or use Epic chat  After 1900, please call [facility] for cross coverage needs 05/31/2024, 9:45 PM

## 2024-05-31 NOTE — Progress Notes (Signed)
 RT placed patient on HHFNC 50L 100% per MD order. Patient tolerating well at this time.

## 2024-05-31 NOTE — Progress Notes (Addendum)
 eLink Physician-Brief Progress Note Patient Name: WILVER TIGNOR DOB: 14-Oct-1970 MRN: [id number]   Date of Service  05/31/2024  HPI/Events of Note  Intubated earlier, repeat ABG within the normal limits.  Now on maximal peripheral norepinephrine  at 10 mcg.  Limited IV access and still has electrolytes and antibiotics  eICU Interventions  Request ground team evaluation for CVC   2226 -left IJ central line acute issues.  Still awaiting Foley catheter.  Intervention Category Minor Interventions: Routine modifications to care plan (e.g. PRN medications for pain, fever)  Yoseph Haile 05/31/2024, 8:59 PM

## 2024-05-31 NOTE — Progress Notes (Signed)
 NAME:  Omar Turner, MRN:  [id number], DOB:  11/29/1970, LOS: 1 ADMISSION DATE:  05/29/2024, CONSULTATION DATE:  05/30/2024 REFERRING MD:  Benton Shone, MD, CHIEF COMPLAINT:  SOB  History of Present Illness:  53 y/o male with PMH for Alcoholic Cirrhosis, Esophageal Varices s/p clipping, COPD, active drinker (1 pint Vodka daily) and smoker (1ppd), Ascites s/p multiple paracentesis, DT's, Withdrawal Seizures, recent MRI abd showing right pleural effusion.  He presented with SOB today.  He says for the last 3 days he has been very weak, not able to get out of bed much.  He has been having N/V/D.  He cannot hold much down and has not taken his meds in a couple days as a result.  He is more SOB and has a dry cough and has been wheezing.  He has an Albuterol  inhaler which he uses prn.  He also has abd pain throughout.  Apparently he was sitting on his porch and found himself to be light headed, almost passing out and fast heart rate.  EMS found him with HR 180's and gave him a 100J shock.  His HR decreased to the 160's.  He remained hypotensive.  His LA was 7.83.  A paracentesis was done in the ED and he had a paracentesis 1 week ago outpatient with about 2 liters removed.  Pertinent  Medical History  Alcoholic Cirrhosis, Esophageal Varices s/p clipping, COPD, active drinker (1 pint Vodka daily) and smoker (1ppd), Ascites s/p multiple paracentesis, DT's, Withdrawal Seizures, recent MRI abd showing right pleural effusion.    Significant Hospital Events: Including procedures, antibiotic start and stop dates in addition to other pertinent events   8/24: Admit to ICU 8/25: Off pressors, worsening hypoxia requiring HHFNC 100% FiO2   Interim History / Subjective:  Off pressors, patient endorses headache, dyspnea and abdominal pain   Objective    Blood pressure 120/73, pulse (!) 111, temperature 98.2 F (36.8 C), resp. rate 19, height 5' 11 (1.803 m), weight 105 kg, SpO2 (!) 88%.    FiO2 (%):  [100  %] 100 %   Intake/Output Summary (Last 24 hours) at 05/31/2024 9082 Last data filed at 05/31/2024 0900 Gross per 24 hour  Intake 1688.07 ml  Output 850 ml  Net 838.07 ml   Filed Weights   05/29/24 1852 05/30/24 0400 05/31/24 0500  Weight: 102.5 kg 101.2 kg 105 kg    Examination: General: chronically ill appearing male HENT: normocephalic, atraumatic Lungs: coarse bilaterally, diminished right base Cardiovascular: tachycardic, regular rhythm, normal S1 and S2, no murmurs, rubs or gallops Abdomen: protuberant, taut, non-tender, normoactive bowel sounds Extremities: warm, pitting edema in ankles, palpable pedal pulses  Neuro: confused and somnolent, alert and oriented x 3, follows commands throughout GU: defer   Cr improving  Lactate improving   Resolved problem list   Assessment and Plan    Acute hypoxic respiratory failure, multifactorial in the setting of volume overload, right pleural effusion and possible pneumonia, worsening Overnight 8/24-8/25 required increased oxygen requirements to HHFNC 50L 100%. CXR shows increasing bilateral opacities suspect pulmonary edema in the setting of fluid resuscitation and third spacing in the setting of decompensated cirrhosis. Also possible aspiration pneumonia given confusion and vomiting.  - Continue HHFNC as needed to maintain SpO2>90% - Diuresis with 40 mg IV lasix  and 25 gm 25% albumin , will monitor response and schedule - place foley for strict I/Os - POCUS done for consideration of right thoracentesis with small pocket and given chronic nature and acute  decompensation overnight less likely contributing to current clinical picture so will defer for now - POCUS done for consideration of paracentesis with just small pocket unlikely to be causing acute decompensation at this time will defer as risks outweigh benefits in the setting of coagulopathy  - Send sputum culture - Broaden to zosyn  from ceftriaxone  and continue doxycycline  for  atypical coverage - Will make NPO in case of intubation   Decompensated 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 MELD score 30 on 8/25. MDF 94.2 (using patient control 15). Current drinker (about 1 pint vodka/day). Diagnostic para in ED did not look consistent with SBP. Followed by [facility] and established with [facility].  - GI consulted, appreciate recommendations - Continue octreotide  and prednisone  - IV diuresis with albumin  per above - Continue home lactulose  and rifaximin  with goal 2-3 bowel movements daily - POCUS for paracentesis per above - Trend daily CMP, CBC, coags with correction of coagulopathy as clinically indicated - Holding home naldolol, eplerenone  and spironolactone  with recent shock and pressor requirement - SCDs only with thrombocytopenia  AKI, improving Hyponatremia Hypomagnesemia HRS versus prerenal in the setting of poor oral intake and diarrhea. Baseline Cr~0.6-0.8.  - Cr improving - Diuresis per above with albumin  - Aggressive electrolyte repletion  Hypotension, improved Lactic acidosis Likely multifactorial in the setting of decompensated cirrhosis and hypovolemia. Possible sepsis with concern for pneumonia. Lactic acidosis improving. - Continue midodrine   - Antibiotics per above - Continue to trend lactate  ?Colitis May be secondary to portal hypertension. Seen on CT AP 8/23. No diarrhea. - Follow-up GI panel   EtOH abuse  - Continue CIWA protocol - Continue thiamine  and multivitamin   GOC - Full code, patient is confused today but previously endorsed wanting everything done  - Low threshold for palliative consultation if he isn't improving in coming days   Best Practice (right click and Reselect all SmartList Selections daily)   Diet/type:  NPO DVT prophylaxis SCD Pressure ulcer(s): N/A GI prophylaxis: PPI Lines: N/A Foley:  will place for strict I/O in the setting of acute decompensation  Code Status:  full code  Labs   CBC: Recent Labs  Lab 05/29/24 1826 05/29/24 1835 05/30/24 0433 05/30/24 0530 05/31/24 0459  WBC 9.7  --  13.2* 12.9* 15.6*  NEUTROABS 7.5  --   --   --   --   HGB 13.2 13.6 12.1* 12.0* 11.1*  HCT 37.3* 40.0 33.5* 33.6* 30.1*  MCV 99.2  --  96.5 97.1 97.1  PLT 57*  --  57* 50* 46*    Basic Metabolic Panel: Recent Labs  Lab 05/29/24 1826 05/29/24 1835 05/30/24 0433 05/30/24 0530 05/30/24 2026 05/31/24 0459  NA 127* 130* 128* 129*  --  129*  K 3.3* 3.3* 4.0 3.6  --  3.6  CL 88*  --  93* 94*  --  94*  CO2 17*  --  18* 19*  --  26  GLUCOSE 77  --  70 89  --  118*  BUN 11  --  10 9  --  13  CREATININE 1.60*  --  1.16 1.26*  --  1.00  CALCIUM  8.0*  --  8.4* 8.5*  --  8.6*  MG  --   --  1.2*  --  2.5* 2.2   GFR: Estimated Creatinine Clearance: 107.8 mL/min (by C-G formula based on SCr of  1 mg/dL). Recent Labs  Lab 05/29/24 1826 05/29/24 1835 05/29/24 2132 05/30/24 0049 05/30/24 0433 05/30/24 0530 05/31/24 0459  WBC 9.7  --   --   --  13.2* 12.9* 15.6*  LATICACIDVEN  --    < > 7.8* 5.9*  --  6.8* 2.3*   < > = values in this interval not displayed.    Liver Function Tests: Recent Labs  Lab 05/29/24 1826 05/30/24 0530 05/31/24 0459  AST 326* 464* 407*  ALT 106* 142* 140*  ALKPHOS 164* 137* 124  BILITOT 19.7* 22.1* 25.7*  PROT 7.3 7.0 7.0  ALBUMIN  2.1* 2.1* 2.6*   Recent Labs  Lab 05/29/24 1826  LIPASE 57*   Recent Labs  Lab 05/29/24 1827 05/30/24 0530 05/31/24 0459  AMMONIA 29 53* 59*    ABG    Component Value Date/Time   HCO3 22.3 05/29/2024 1835   TCO2 23 05/29/2024 1835   ACIDBASEDEF 1.0 05/29/2024 1835   O2SAT 65 05/29/2024 1835     Coagulation Profile: Recent Labs  Lab 05/29/24 1826 05/30/24 0530 05/31/24 0459  INR 2.1* 2.2* 2.7*     Cardiac Enzymes: No results for input(s): CKTOTAL, CKMB, CKMBINDEX, TROPONINI in the last 168 hours.  HbA1C: Hgb A1c MFr Bld  Date/Time Value Ref Range Status  05/30/2024 04:33 AM 3.8 (L) 4.8 - 5.6 % Final    Comment:    (NOTE) Diagnosis of Diabetes The following HbA1c ranges recommended by the American Diabetes Association (ADA) may be used as an aid in the diagnosis of diabetes mellitus.  Hemoglobin             Suggested A1C NGSP%              Diagnosis  <5.7                   Non Diabetic  5.7-6.4                Pre-Diabetic  >6.4                   Diabetic  <7.0                   Glycemic control for                       adults with diabetes.      CBG: Recent Labs  Lab 05/30/24 1727 05/30/24 2023 05/31/24 0011 05/31/24 0426 05/31/24 0741  GLUCAP 138* 152* 128* 129* 113*    CRITICAL CARE Performed by: Rexene LOISE Blush  The patient is critically ill with multiple organ system failure and requires high complexity decision making for assessment and support, frequent evaluation and titration of therapies, advanced monitoring, review of radiographic studies and interpretation of complex data.   Critical Care Time devoted to patient care services, exclusive of separately billable procedures, described in this note is 65 minutes.  Rexene LOISE Blush, PA-C [facility] 05/31/24 10:37 AM  Please see [email or web address] for pager details.  From 7A-7P if no response, please call [phone] After hours, please call ELink [phone]

## 2024-05-31 NOTE — Progress Notes (Addendum)
 Patient ID: Omar Turner, male   DOB: 1971/02/26, 53 y.o.   MRN: [id number]    Progress Note   Subjective   Day # 2 CC; weakness and shortness of breath in setting of decompensated cirrhosis, right pleural effusion, possible left lower lobe pneumonia worsening  MEL D-30 MDF= 67.6  Off pressors On high flow nasal cannula Antibiotics broadened/and Zosyn /doxycycline   Peritoneal fluid cell count 8/23-consistent with SBP, culture no growth x 12 hours no organisms seen  Labs today WBC 15.6/hemoglobin 11.1/hematocrit 30.1/platelets 46 Pro time 29.9/INR 2.7 Sodium 129/potassium 3.6/BUN 13/creatinine 1.0 T. bili 25.7/alk phos 124/AST 407/ALT 140  Chest x-ray today-right basilar collapse/consolidation with diffuse airspace opacification worrisome for pneumonia, cannot rule out centrally obstructing mass right lung, large right pleural effusion  Patient denies any shortness of breath currently, does state his abdomen feels tight.  He is oriented to month and year intermittently confused stating that he thought he was told that he is leaving the hospital, tried to get out of bed a couple of times.   Objective   Vital signs in last 24 hours: Temp:  [98.2 F (36.8 C)-98.3 F (36.8 C)] 98.2 F (36.8 C) (08/25 0743) Pulse Rate:  [87-155] 111 (08/25 0811) Resp:  [14-28] 19 (08/25 0811) BP: (102-129)/(65-91) 120/73 (08/25 0743) SpO2:  [79 %-96 %] 88 % (08/25 0811) FiO2 (%):  [100 %] 100 % (08/25 0811) Weight:  [105 kg] 105 kg (08/25 0500) Last BM Date : 05/30/24 General:    Older white male in NAD deeply icteric Heart:  tachy Regular rate and rhythm; no murmurs Lungs: Respirations even rather diffuse rhonchi bilaterally Abdomen: Significant ascites but not tense, no focal abdominal tenderness ,normal bowel sounds. Extremities:  Without edema. Neurologic:  Alert and oriented, x 3 , no asterixis, some intermittent confusion during conversation Psych:  Cooperative. Normal mood and  affect.  Intake/Output from previous day: 08/24 0701 - 08/25 0700 In: 1988.1 [P.O.:240; I.V.:1103.9; IV Piggyback:644.2] Out: 650 [Urine:650] Intake/Output this shift: Total I/O In: -  Out: 400 [Urine:400]  Lab Results: Recent Labs    05/30/24 0433 05/30/24 0530 05/31/24 0459  WBC 13.2* 12.9* 15.6*  HGB 12.1* 12.0* 11.1*  HCT 33.5* 33.6* 30.1*  PLT 57* 50* 46*   BMET Recent Labs    05/30/24 0433 05/30/24 0530 05/31/24 0459  NA 128* 129* 129*  K 4.0 3.6 3.6  CL 93* 94* 94*  CO2 18* 19* 26  GLUCOSE 70 89 118*  BUN 10 9 13   CREATININE 1.16 1.26* 1.00  CALCIUM  8.4* 8.5* 8.6*   LFT Recent Labs    05/31/24 0459  PROT 7.0  ALBUMIN  2.6*  AST 407*  ALT 140*  ALKPHOS 124  BILITOT 25.7*   PT/INR Recent Labs    05/30/24 0530 05/31/24 0459  LABPROT 25.1* 29.9*  INR 2.2* 2.7*    Studies/Results: DG CHEST PORT 1 VIEW Result Date: 05/31/2024 CLINICAL DATA:  Acute hypoxic respiratory failure. EXAM: PORTABLE CHEST 1 VIEW COMPARISON:  05/29/2024 and CT chest 06/07/2016. FINDINGS: Trachea is midline. Heart size stable. Diffuse mixed interstitial and airspace opacification. Large right pleural effusion. Right basilar collapse/consolidation. IMPRESSION: 1. Right basilar collapse/consolidation with diffuse airspace opacification, findings worrisome for pneumonia. Centrally obstructing mass in the right lung cannot be excluded. Followup PA and lateral chest X-ray is recommended in 3-4 weeks following trial of antibiotic therapy to ensure resolution and exclude underlying malignancy. 2. Large right pleural effusion. Electronically Signed   By: Newell Eke M.D.   On:  05/31/2024 09:35   US  Abdomen Limited RUQ (LIVER/GB) Result Date: 05/30/2024 CLINICAL DATA:  Cholecystitis. EXAM: ULTRASOUND ABDOMEN LIMITED RIGHT UPPER QUADRANT COMPARISON:  Abdomen and pelvis CT from 1 day prior. FINDINGS: Gallbladder: No gallstones evident. Gallbladder wall thickness is increased at 4-5 mm  although gallbladder is incompletely distended. Abdominal ascites evident. Common bile duct: Diameter: Extrahepatic common duct could not be visualized. Liver: Markedly echogenic with nodular contour consistent with cirrhosis. Portal vein could not be identified due to poor acoustic through transmission of the liver parenchyma. Other: Moderate to large volume ascites.  Right pleural effusion. IMPRESSION: 1. Cirrhotic liver with moderate to large volume ascites. 2. Gallbladder wall thickening without gallstones. Gallbladder is incompletely distended. Gallbladder wall thickening is nonspecific in the setting of cirrhosis and ascites. 3. Right pleural effusion. Electronically Signed   By: Camellia Candle M.D.   On: 05/30/2024 05:32   CT ABDOMEN PELVIS W CONTRAST Result Date: 05/29/2024 CLINICAL DATA:  Abdominal pain. EXAM: CT ABDOMEN AND PELVIS WITH CONTRAST TECHNIQUE: Multidetector CT imaging of the abdomen and pelvis was performed using the standard protocol following bolus administration of intravenous contrast. RADIATION DOSE REDUCTION: This exam was performed according to the departmental dose-optimization program which includes automated exposure control, adjustment of the mA and/or kV according to patient size and/or use of iterative reconstruction technique. CONTRAST:  75mL OMNIPAQUE  IOHEXOL  350 MG/ML SOLN COMPARISON:  December 17, 2023 FINDINGS: Lower chest: Mild to moderate severity infiltrate is seen within the left lower lobe and adjacent portion of the lingula. Moderate severity right basilar atelectasis is present. There is a large right pleural effusion. Hepatobiliary: The liver is cirrhotic in appearance and contains innumerable subcentimeter hepatic cysts a moderate amount of sludge is suspected within the lumen of a mildly distended gallbladder. There is no evidence of gallbladder wall thickening or pericholecystic inflammation. Pancreas: Unremarkable. No pancreatic ductal dilatation or surrounding  inflammatory changes. Spleen: Normal in size without focal abnormality. Adrenals/Urinary Tract: Adrenal glands are unremarkable. Kidneys are normal, without renal calculi, focal lesion, or hydronephrosis. Bladder is unremarkable. Stomach/Bowel: Stomach is within normal limits. Appendix appears normal. No evidence of bowel dilatation. The length of the colon is markedly thickened and inflamed. Vascular/Lymphatic: Aortic atherosclerosis. No enlarged abdominal or pelvic lymph nodes. Reproductive: Prostate is unremarkable. Other: A small, fluid-filled umbilical hernia is noted. A moderate amount of abdominopelvic ascites is present. Musculoskeletal: No acute or significant osseous findings. IMPRESSION: 1. Mild to moderate severity left lower lobe and adjacent lingular infiltrate. 2. Large right pleural effusion. 3. Cirrhotic liver with innumerable subcentimeter hepatic cysts. 4. Moderate amount of abdominopelvic ascites. 5. Markedly thickened and inflamed colon, consistent with colitis. 6. Aortic atherosclerosis. Electronically Signed   By: Suzen Dials M.D.   On: 05/29/2024 22:18   DG Chest Port 1 View Result Date: 05/29/2024 CLINICAL DATA:  SVT. EXAM: PORTABLE CHEST 1 VIEW COMPARISON:  December 17, 2023 FINDINGS: The heart size and mediastinal contours are within normal limits. Marked severity atelectasis and/or infiltrate is seen within the right lung base. Mild to moderate severity mid left lung infiltrate is also noted. A large right pleural effusion is present. No pneumothorax is identified. The visualized skeletal structures are unremarkable. IMPRESSION: 1. Marked severity right basilar atelectasis and/or infiltrate. 2. Mild to moderate severity mid left lung infiltrate. 3. Large right pleural effusion. Electronically Signed   By: Suzen Dials M.D.   On: 05/29/2024 22:11       Assessment / Plan:    #9 53 year old male with history  of decompensated cirrhosis, active drinking, with previous  ascites, and esophageal varices status post recent variceal ligation and rebanding about 1 month ago , also requiring more recent paracenteses over the past few months who presented to the emergency room with multiple complaints.  Generally feeling unwell complaining of nausea, diarrhea, poor oral intake, abdominal discomfort and possible fever.  Had not taken any of his meds for a few days as could not keep them down.  Too weak to get out of bed on the day of admission When found by EMS had heart rate in the 180s/SVT and was shocked Remained hypotensive Lactate 7.8  Paracentesis in ER-cell counts were done yesterday and not consistent with SBP.  Off pressors today On midodrine  5 3 times daily  #2 large right effusion, right basilar collapse/consolidation worrisome for pneumonia. Patient being managed by CCM, antibiotics broadened today to Zosyn  and doxycycline . Now requiring high flow nasal cannula-high risk for requiring vent support  Concern on admission for possible hepatic hydrothorax.  + Ultrasound today with small pocket of ascites noted unlikely to be felt to be contributing to his respiratory decompensation and likely not enough fluid to warrant repeat paracentesis.  #3 acute kidney injury-parameters improved  #4 acute EtOH hepatitis-discriminant function score 67 on admit, quite high with rater than 50% chance of mortality within the next 30 days Parameters worse today with INR now 2.7 Not a candidate for steroids given pneumonia  #5 history of hepatic encephalopathy-on Xifaxan  at home-oriented to day and year but intermittently confused as to his current situation Add lactulose  30 cc twice daily  #6 active EtOH abuse/watch for withdrawal-CIWA protocol  Patient is critically ill with worsening hepatic failure in setting of acute EtOH hepatitis and known decompensated cirrhosis.  High mortality risk.    LOS: 1 day   Amy EsterwoodPA-C  05/31/2024, 10:55 AM     ATTENDING  ADDENDUM: Seen and examined, reviewed with Amy Esterwood.  53 y/o male with decompensated cirrhosis, history of ascites and esophageal varices s/p ligation in the past. Significant recent EtOH use - he reports drinking 1 gallon of vodka per day in recent months and more recent titrates down to about 10 drinks per day. At high risk for EtOH withdrawal which he has had in the past.   Respiratory status has worsened, concern for pneumonia on high flow oxygen and being treated with broad spectrum antibiotics. Possible hydrothorax as well. Prior diagnostic paracentesis negative for SBP. His leukocytosis is worsening. May need intubation pending his course.   His bilirubinemia is worsening, likely due to alcoholic hepatitis. Not currently a candidate for steroids given his pneumonia. Will give vitamin K , monitor LFTs and INR daily. His CT imaging did not show any ductal dilation. He does appear to have some sludge. I think cholangitis is unlikely here, could consider MRCP to make sure, although he is not stable for that right now. I suspect alcoholic hepatitis is driving his bilirubinemia.   He is confused on exam, appears encephalopathic. Continue rifaximin  and and adding lactulose . If his respiratory / mental status worsens he may need to be intubated.   Critically ill, at risk for mortality during this admission. Continue supportive care.  Call with questions.  Marcey Naval, MD [facility]

## 2024-06-01 ENCOUNTER — Ambulatory Visit: Admission: RE | Admit: 2024-06-01 | Source: Home / Self Care | Admitting: Internal Medicine

## 2024-06-01 ENCOUNTER — Inpatient Hospital Stay (HOSPITAL_COMMUNITY)

## 2024-06-01 DIAGNOSIS — R6520 Severe sepsis without septic shock: Secondary | ICD-10-CM | POA: Diagnosis not present

## 2024-06-01 DIAGNOSIS — K7010 Alcoholic hepatitis without ascites: Secondary | ICD-10-CM

## 2024-06-01 DIAGNOSIS — A419 Sepsis, unspecified organism: Secondary | ICD-10-CM | POA: Diagnosis not present

## 2024-06-01 DIAGNOSIS — K7031 Alcoholic cirrhosis of liver with ascites: Secondary | ICD-10-CM | POA: Diagnosis not present

## 2024-06-01 DIAGNOSIS — E8809 Other disorders of plasma-protein metabolism, not elsewhere classified: Secondary | ICD-10-CM

## 2024-06-01 HISTORY — DX: Hypomagnesemia: E83.42

## 2024-06-01 HISTORY — DX: Portal hypertension: K31.89

## 2024-06-01 HISTORY — DX: Secondary esophageal varices with bleeding: I85.11

## 2024-06-01 HISTORY — DX: Unspecified atrial fibrillation: I48.91

## 2024-06-01 HISTORY — DX: Thrombocytopenia, unspecified: D69.6

## 2024-06-01 HISTORY — DX: Gastritis, unspecified, with bleeding: K29.71

## 2024-06-01 HISTORY — DX: Paroxysmal atrial fibrillation: I48.0

## 2024-06-01 HISTORY — DX: Hypo-osmolality and hyponatremia: E87.1

## 2024-06-01 HISTORY — DX: Alcoholic cirrhosis of liver with ascites: K70.31

## 2024-06-01 HISTORY — DX: Heart failure, unspecified: I50.9

## 2024-06-01 HISTORY — DX: Atherosclerotic heart disease of native coronary artery without angina pectoris: I25.10

## 2024-06-01 LAB — POCT I-STAT 7, (LYTES, BLD GAS, ICA,H+H)
Acid-Base Excess: 5 mmol/L — ABNORMAL HIGH (ref 0.0–2.0)
Acid-Base Excess: 3 mmol/L — ABNORMAL HIGH (ref 0.0–2.0)
Bicarbonate: 29.5 mmol/L — ABNORMAL HIGH (ref 20.0–28.0)
Bicarbonate: 29.4 mmol/L — ABNORMAL HIGH (ref 20.0–28.0)
Calcium, Ion: 1.13 mmol/L — ABNORMAL LOW (ref 1.15–1.40)
Calcium, Ion: 1.14 mmol/L — ABNORMAL LOW (ref 1.15–1.40)
HCT: 33 % — ABNORMAL LOW (ref 39.0–52.0)
HCT: 35 % — ABNORMAL LOW (ref 39.0–52.0)
Hemoglobin: 11.2 g/dL — ABNORMAL LOW (ref 13.0–17.0)
Hemoglobin: 11.9 g/dL — ABNORMAL LOW (ref 13.0–17.0)
O2 Saturation: 99 %
O2 Saturation: 94 %
Patient temperature: 98
Patient temperature: 98.2
Potassium: 4.1 mmol/L (ref 3.5–5.1)
Potassium: 4.2 mmol/L (ref 3.5–5.1)
Sodium: 131 mmol/L — ABNORMAL LOW (ref 135–145)
Sodium: 131 mmol/L — ABNORMAL LOW (ref 135–145)
TCO2: 31 mmol/L (ref 22–32)
TCO2: 31 mmol/L (ref 22–32)
pCO2 arterial: 41.2 mmHg (ref 32–48)
pCO2 arterial: 53 mmHg — ABNORMAL HIGH (ref 32–48)
pH, Arterial: 7.463 — ABNORMAL HIGH (ref 7.35–7.45)
pH, Arterial: 7.351 (ref 7.35–7.45)
pO2, Arterial: 108 mmHg (ref 83–108)
pO2, Arterial: 76 mmHg — ABNORMAL LOW (ref 83–108)

## 2024-06-01 LAB — COMPREHENSIVE METABOLIC PANEL WITH GFR
ALT: 135 U/L — ABNORMAL HIGH (ref 0–44)
AST: 354 U/L — ABNORMAL HIGH (ref 15–41)
Albumin: 3.3 g/dL — ABNORMAL LOW (ref 3.5–5.0)
Alkaline Phosphatase: 90 U/L (ref 38–126)
Anion gap: 10 (ref 5–15)
BUN: 21 mg/dL — ABNORMAL HIGH (ref 6–20)
CO2: 26 mmol/L (ref 22–32)
Calcium: 8.9 mg/dL (ref 8.9–10.3)
Chloride: 92 mmol/L — ABNORMAL LOW (ref 98–111)
Creatinine, Ser: 1.18 mg/dL (ref 0.61–1.24)
GFR, Estimated: >60 mL/min (ref >60)
Glucose, Bld: 123 mg/dL — ABNORMAL HIGH (ref 70–99)
Potassium: 4.1 mmol/L (ref 3.5–5.1)
Sodium: 128 mmol/L — ABNORMAL LOW (ref 135–145)
Total Bilirubin: 28.9 mg/dL (ref 0.0–1.2)
Total Protein: 6.9 g/dL (ref 6.5–8.1)

## 2024-06-01 LAB — CBC
HCT: 29 % — ABNORMAL LOW (ref 39.0–52.0)
Hemoglobin: 10.3 g/dL — ABNORMAL LOW (ref 13.0–17.0)
MCH: 35.4 pg — ABNORMAL HIGH (ref 26.0–34.0)
MCHC: 35.5 g/dL (ref 30.0–36.0)
MCV: 99.7 fL (ref 80.0–100.0)
Platelets: 51 K/uL — ABNORMAL LOW (ref 150–400)
RBC: 2.91 MIL/uL — ABNORMAL LOW (ref 4.22–5.81)
RDW: 17.2 % — ABNORMAL HIGH (ref 11.5–15.5)
WBC: 14.4 K/uL — ABNORMAL HIGH (ref 4.0–10.5)
nRBC: 0 % (ref 0.0–0.2)

## 2024-06-01 LAB — PROTIME-INR
INR: 2.7 — ABNORMAL HIGH (ref 0.8–1.2)
Prothrombin Time: 30.1 s — ABNORMAL HIGH (ref 11.4–15.2)

## 2024-06-01 LAB — BASIC METABOLIC PANEL WITH GFR
Anion gap: 12 (ref 5–15)
BUN: 25 mg/dL — ABNORMAL HIGH (ref 6–20)
CO2: 26 mmol/L (ref 22–32)
Calcium: 8.4 mg/dL — ABNORMAL LOW (ref 8.9–10.3)
Chloride: 88 mmol/L — ABNORMAL LOW (ref 98–111)
Creatinine, Ser: 1.09 mg/dL (ref 0.61–1.24)
GFR, Estimated: >60 mL/min (ref >60)
Glucose, Bld: 172 mg/dL — ABNORMAL HIGH (ref 70–99)
Potassium: 4.1 mmol/L (ref 3.5–5.1)
Sodium: 126 mmol/L — ABNORMAL LOW (ref 135–145)

## 2024-06-01 LAB — BODY FLUID CULTURE W GRAM STAIN
Culture: NO GROWTH
Gram Stain: NONE SEEN
Special Requests: NORMAL

## 2024-06-01 LAB — TRIGLYCERIDES: Triglycerides: 105 mg/dL (ref <150)

## 2024-06-01 LAB — MAGNESIUM: Magnesium: 2 mg/dL (ref 1.7–2.4)

## 2024-06-01 LAB — LEGIONELLA PNEUMOPHILA SEROGP 1 UR AG: L. pneumophila Serogp 1 Ur Ag: NEGATIVE

## 2024-06-01 LAB — HEPATITIS PANEL, ACUTE
HCV Ab: NONREACTIVE
Hep A IgM: NONREACTIVE
Hep B C IgM: NONREACTIVE
Hepatitis B Surface Ag: NONREACTIVE

## 2024-06-01 LAB — GLUCOSE, CAPILLARY
Glucose-Capillary: 129 mg/dL — ABNORMAL HIGH (ref 70–99)
Glucose-Capillary: 110 mg/dL — ABNORMAL HIGH (ref 70–99)
Glucose-Capillary: 122 mg/dL — ABNORMAL HIGH (ref 70–99)
Glucose-Capillary: 177 mg/dL — ABNORMAL HIGH (ref 70–99)
Glucose-Capillary: 130 mg/dL — ABNORMAL HIGH (ref 70–99)

## 2024-06-01 LAB — LACTIC ACID, PLASMA: Lactic Acid, Venous: 1.6 mmol/L (ref 0.5–1.9)

## 2024-06-01 LAB — PHOSPHORUS: Phosphorus: 4 mg/dL (ref 2.5–4.6)

## 2024-06-01 SURGERY — EGD (ESOPHAGOGASTRODUODENOSCOPY)
Anesthesia: General

## 2024-06-01 MED ORDER — VITAL 1.5 CAL PO LIQD
1000.0000 mL | ORAL | Status: DC
  Administered 2024-06-01: 1000 mL

## 2024-06-01 MED ORDER — ACETYLCYSTEINE LOAD VIA INFUSION
15000.0000 mg | Freq: Once | INTRAVENOUS | Status: DC
  Filled 2024-06-01: qty 492

## 2024-06-01 MED ORDER — NOREPINEPHRINE 4 MG/250ML-% IV SOLN: 0.0000 ug/min | INTRAVENOUS | Status: DC

## 2024-06-01 MED ORDER — ACETYLCYSTEINE LOAD VIA INFUSION
15000.0000 mg | Freq: Once | INTRAVENOUS | Status: AC
  Administered 2024-06-01: 15000 mg via INTRAVENOUS
  Filled 2024-06-01: qty 492

## 2024-06-01 MED ORDER — ORAL CARE MOUTH RINSE: 15.0000 mL | OROMUCOSAL | Status: DC | PRN

## 2024-06-01 MED ORDER — BISACODYL 10 MG RE SUPP
10.0000 mg | Freq: Once | RECTAL | Status: AC
  Administered 2024-06-01: 10 mg via RECTAL
  Filled 2024-06-01: qty 1

## 2024-06-01 MED ORDER — VITAMIN K1 10 MG/ML IJ SOLN
10.0000 mg | Freq: Once | INTRAVENOUS | Status: AC
  Administered 2024-06-02: 10 mg via INTRAVENOUS
  Filled 2024-06-01: qty 1

## 2024-06-01 MED ORDER — SENNOSIDES 8.8 MG/5ML PO SYRP
5.0000 mL | ORAL_SOLUTION | Freq: Two times a day (BID) | ORAL | Status: DC
  Administered 2024-06-01 – 2024-06-02 (×3): 5 mL via ORAL
  Filled 2024-06-01 (×3): qty 5

## 2024-06-01 MED ORDER — NOREPINEPHRINE 16 MG/250ML-% IV SOLN
0.0000 ug/min | INTRAVENOUS | Status: DC
  Administered 2024-06-02: 15 ug/min via INTRAVENOUS
  Administered 2024-06-02: 6 ug/min via INTRAVENOUS
  Filled 2024-06-01 (×3): qty 250

## 2024-06-01 MED ORDER — LACTULOSE 10 GM/15ML PO SOLN
20.0000 g | ORAL | Status: DC
  Administered 2024-06-01 – 2024-06-02 (×13): 20 g
  Filled 2024-06-01 (×12): qty 30

## 2024-06-01 MED ORDER — DEXTROSE 5 % IV SOLN
6.2500 mg/kg/h | INTRAVENOUS | Status: DC
  Administered 2024-06-02 – 2024-06-04 (×2): 6.25 mg/kg/h via INTRAVENOUS
  Filled 2024-06-01 (×3): qty 90

## 2024-06-01 MED ORDER — PROSOURCE TF20 ENFIT COMPATIBL EN LIQD
60.0000 mL | Freq: Every day | ENTERAL | Status: DC
  Administered 2024-06-01 – 2024-06-08 (×7): 60 mL
  Filled 2024-06-01 (×7): qty 60

## 2024-06-01 MED ORDER — VITAL 1.5 CAL PO LIQD: 1000.0000 mL | ORAL | Status: DC

## 2024-06-01 MED ORDER — VITAMIN K1 10 MG/ML IJ SOLN
10.0000 mg | Freq: Once | INTRAVENOUS | Status: AC
  Administered 2024-06-01: 10 mg via INTRAVENOUS
  Filled 2024-06-01: qty 1

## 2024-06-01 MED ORDER — DEXTROSE 5 % IV SOLN
12.5000 mg/kg/h | INTRAVENOUS | Status: AC
  Administered 2024-06-01 (×2): 12.5 mg/kg/h via INTRAVENOUS
  Filled 2024-06-01: qty 90

## 2024-06-01 MED ORDER — NOREPINEPHRINE 16 MG/250ML-% IV SOLN
0.0000 ug/min | INTRAVENOUS | Status: DC
  Administered 2024-06-01: 2 ug/min via INTRAVENOUS
  Filled 2024-06-01: qty 250

## 2024-06-01 MED ORDER — ORAL CARE MOUTH RINSE
15.0000 mL | OROMUCOSAL | Status: DC
  Administered 2024-06-01 – 2024-06-08 (×73): 15 mL via OROMUCOSAL

## 2024-06-01 NOTE — TOC CM/SW Note (Signed)
 Transition of Care [facility]) - Inpatient Brief Assessment   Patient Details  Name: Omar Turner MRN: [id number] Date of Birth: Jan 01, 1971  Transition of Care [facility]) CM/SW Contact:    Tom-Johnson, Harvest Muskrat, RN Phone Number: 06/01/2024, 2:52 PM   Clinical Narrative:  Patient presented to the ED with Shortness Of Breath, dry Cough and Wheezing. Patient has hx of Alcoholic Cirrhosis, Esophageal Varices s/p clipping, COPD, ETOH, Smoker, Ascites s/p multiple Paracentesis, DT's, Withdrawal Seizures, Rt Pleural Effusion. Patient underwent Paracentesis in the ED. Admitted with Hypotension, currently intubated and sedated. On IV abx.    No ICM needs or recommendations noted at this time.  Patient not Medically ready for discharge.  CM will continue to follow as patient progresses with care towards discharge.          Transition of Care Asessment:

## 2024-06-01 NOTE — TOC CAGE-AID Note (Addendum)
 Transition of Care [facility]) - CAGE-AID Screening   Patient Details  Name: Omar Turner MRN: [id number] Date of Birth: 07/02/1971  Transition of Care [facility]) CM/SW Contact:    Lendia Dais, LCSWA Phone Number: 06/01/2024, 3:33 PM   Clinical Narrative: CSW unable to complete CAGE aid due to patient being intubated.  CSW will continue to follow.    CAGE-AID Screening: Substance Abuse Screening unable to be completed due to: : Patient unable to participate             Substance Abuse Education Offered: No

## 2024-06-01 NOTE — Progress Notes (Signed)
 Initial Nutrition Assessment  DOCUMENTATION CODES:   Not applicable  INTERVENTION:  Initiate tube feeding via NGT, plans for Cortrak placement tomorrow: Start Vital 1.5 at 64ml/hr and advance by 10ml q8h to goal rate of 12ml/hr ( per day) 60ml ProSource TF20 once daily Provides 2060kcal, 109g protein and 1008ml free water  daily  Continue folvite , MVI and thiamine  in setting of chronic EtOH use.   NUTRITION DIAGNOSIS:  Inadequate oral intake related to acute illness as evidenced by NPO status.  GOAL:  Patient will meet greater than or equal to 90% of their needs  MONITOR:  Vent status, Labs, Weight trends, TF tolerance, Skin, I & O's  REASON FOR ASSESSMENT: Ventilator, Consult Enteral/tube feeding initiation and management  ASSESSMENT:  Pt admitted with shortness of breath and increasing weakness, n/v/d. PMH significant for alcoholic cirrhosis, esophageal varices s/p clipping, COPD, current EtOH use (1 pint vodka daily) and smoker (1ppd), ascites s/p multiple paracentesis, withdrawal seizures.  8/24: admitted to ICU  8/25: worsening hypoxia; intubated  Patient is currently intubated on ventilator support MV: 12 L/min Temp (24hrs), Avg:98.3 F (36.8 C), Min:97.9 F (36.6 C), Max:98.9 F (37.2 C)  GI following- pt likely with acute alcoholic hepatitis in addition to decompensated cirrhosis. On lactulose  and rifaximin  for encephalopathy.   Paracentesis attempted on admission, not enough fluid for analysis. No SBP on testing.   Last paracentesis 1 week PTA- yield 2L  Spoke with pt's mom, whom pt lives with, and his daughter at bedside.  His mom reports that he had been eating well up until a week ago. He generally enjoys eating meat. Within the last week, he has tried to eat but intake has been very minimal compared to baseline.   She reports that pt does not limit his fluid intake but takes a diuretic. She prepares low sodium meals for him and uses no salt  substitute.   Pt's mom suspects that his baseline weight is about 235 lbs but is not certain. She does report that he has recently experienced unintentional weight loss.   Drains/lines: OGT (side port gastric) UOP: x24 hours + 1 unmeasured occurrence L internal jugular triple lumen  Abdomen very distended and taut. Receiving bowel regimen + lactulose .  Current weight not likely accurate given significant abdominal distension.  Admit weight: 102.5 kg Current weight: 100.8 kg  Medications: colace BID, folvite  daily, SSI 0-9 units q4h, lactulose  20g q2h, , MVI, miralax  daily, senna BID, thiamine  daily Drips: Levo @ 10 mcg/min Propofol  @ 12.43ml/hr   Labs:  Sodium 131 BUN 21 Ionized calcium  1.13 Lipase 57 (8/23) AST 354 ALT 135 Total bilirubin 28.9 CBG's 110-147 x24 hours HgbA1c 3.8% (8/24)  NUTRITION - FOCUSED PHYSICAL EXAM: Flowsheet Row Most Recent Value  Orbital Region Unable to assess  Upper Arm Region Moderate depletion  Thoracic and Lumbar Region Unable to assess  [distended abdomen]  Buccal Region Unable to assess  [vent]  Temple Region Moderate depletion  Clavicle Bone Region Mild depletion  Clavicle and Acromion Bone Region Mild depletion  Scapular Bone Region Unable to assess  Dorsal Hand Unable to assess  [handmits]  Patellar Region Moderate depletion  Anterior Thigh Region Moderate depletion  Posterior Calf Region No depletion  Edema (RD Assessment) Mild  [non-pitting generalized]  Hair Reviewed  Eyes Unable to assess  Mouth Unable to assess  Skin Other (Comment)  [jaundice]  Nails Unable to assess  [handmits]    Diet Order:   Diet Order  Diet NPO time specified Except for: Sips with Meds  Diet effective now                   EDUCATION NEEDS:  No education needs have been identified at this time  Skin:  Skin Assessment: Reviewed RN Assessment (jaundice)  Last BM:  PTA/unknown  Height:  Ht Readings from Last 1  Encounters:  05/30/24 5' 11 (1.803 m)    Weight:  Wt Readings from Last 1 Encounters:  06/01/24 100.8 kg    Ideal Body Weight:  78.2 kg  BMI:  Body mass index is 30.99 kg/m.  Estimated Nutritional Needs:   Kcal:  1900-2100  Protein:  100-115g  Fluid:  1.8L  Royce Maris, RDN, LDN Clinical Nutrition See AMi[facility] for contact information.

## 2024-06-01 NOTE — Progress Notes (Addendum)
 Patient ID: TARRENCE Turner, male   DOB: July 17, 1971, 53 y.o.   MRN: [id number]    Progress Note   Subjective   Day # 3 CC; weakness, shortness of breath in setting of decompensated cirrhosis, right pleural effusion, probable worsening pneumonia  Patient required intubation last night Pressors initiated for hypotension  Chest x-ray diffuse bilateral heterogeneous and consolidative airspace opacity, layering right pleural effusion  Labs today lactate 1.6 Pro time 30.1/INR 2.7 WBC 14.4/hemoglobin 10.3/hematocrit 29.0/platelet 51  Sodium 128/potassium 4.1/BUN 21/creatinine 1.18 T. bili 28.9/alk phos 90/AST 354/ALT 135  Family at bedside this morning, spoke to patient's mother outside of the room.   Objective   Vital signs in last 24 hours: Temp:  [97.9 F (36.6 C)-98.9 F (37.2 C)] 97.9 F (36.6 C) (08/26 0720) Pulse Rate:  [72-146] 73 (08/26 0900) Resp:  [18-27] 20 (08/26 0900) BP: (76-183)/(42-109) 100/72 (08/26 0900) SpO2:  [87 %-100 %] 99 % (08/26 0900) FiO2 (%):  [80 %-100 %] 80 % (08/26 0827) Weight:  [100.8 kg] 100.8 kg (08/26 0500) Last BM Date : 05/30/24 General:   Older white male , intubated sedated, deeply jaundiced Heart:  Regular rate and rhythm; no murmurs Lungs: Breath sounds bilaterally, scattered rhonchi Abdomen:  Soft, bowel sounds quiet, ascites present but nontense. Extremities:  Without edema. Neurologic: Sedated Psych: Sedated  Intake/Output from previous day: 08/25 0701 - 08/26 0700 In: 1728.7 [I.V.:527.4; IV Piggyback:1201.3] Out: 1650 [Urine:1650] Intake/Output this shift: Total I/O In: 78 [I.V.:68.7; IV Piggyback:9.3] Out: 150 [Urine:150]  Lab Results: Recent Labs    05/30/24 0530 05/31/24 0459 05/31/24 1535 05/31/24 2015 06/01/24 0518 06/01/24 0608  WBC 12.9* 15.6*  --   --  14.4*  --   HGB 12.0* 11.1*   < > 10.5* 10.3* 11.2*  HCT 33.6* 30.1*   < > 31.0* 29.0* 33.0*  PLT 50* 46*  --   --  51*  --    < > = values in this interval  not displayed.   BMET Recent Labs    05/31/24 0459 05/31/24 1302 05/31/24 1535 05/31/24 2015 06/01/24 0518 06/01/24 0608  NA 129* 130*   < > 129* 128* 131*  K 3.6 3.8   < > 4.5 4.1 4.1  CL 94* 92*  --   --  92*  --   CO2 26 26  --   --  26  --   GLUCOSE 118* 129*  --   --  123*  --   BUN 13 12  --   --  21*  --   CREATININE 1.00 0.79  --   --  1.18  --   CALCIUM  8.6* 8.8*  --   --  8.9  --    < > = values in this interval not displayed.   LFT Recent Labs    06/01/24 0518  PROT 6.9  ALBUMIN  3.3*  AST 354*  ALT 135*  ALKPHOS 90  BILITOT 28.9*   PT/INR Recent Labs    05/31/24 0459 06/01/24 0518  LABPROT 29.9* 30.1*  INR 2.7* 2.7*    Studies/Results: DG CHEST PORT 1 VIEW Result Date: 06/01/2024 CLINICAL DATA:  Respiratory failure EXAM: PORTABLE CHEST 1 VIEW COMPARISON:  05/31/2024 FINDINGS: No significant change in AP portable chest radiograph. Endotracheal tube, esophagogastric tube, and left neck vascular catheter remain in unchanged, satisfactory position. Heart size is normal. Diffuse bilateral heterogeneous and consolidative airspace opacity with layering right pleural effusion. No acute osseous findings. IMPRESSION: 1. No significant change  in AP portable chest radiograph. Endotracheal tube, esophagogastric tube, and left neck vascular catheter remain in unchanged, satisfactory position. 2. Diffuse bilateral heterogeneous and consolidative airspace opacity with layering right pleural effusion. Electronically Signed   By: Marolyn JONETTA Jaksch M.D.   On: 06/01/2024 06:53   DG CHEST PORT 1 VIEW Result Date: 05/31/2024 CLINICAL DATA:  Central line placement EXAM: PORTABLE CHEST 1 VIEW COMPARISON:  05/31/2024 FINDINGS: Endotracheal tube is present with tip measuring 5.6 cm above the carina. Enteric tube is present. Tip is off the field of view but below the left hemidiaphragm. Left central venous catheter has been placed with tip over the cavoatrial junction region. No  pneumothorax. Heart size is normal. Bilateral perihilar infiltrates with basilar consolidation or atelectasis on the right. Moderate right pleural effusion. Similar appearance to previous study. IMPRESSION: 1. Appliances appear in satisfactory position. Left central venous catheter with tip over the cavoatrial junction region without pneumothorax. 2. Persistent perihilar infiltrates. Persistent right pleural effusion with basilar atelectasis or consolidation. Electronically Signed   By: Elsie Gravely M.D.   On: 05/31/2024 23:14   DG Abd Portable 1V Result Date: 05/31/2024 CLINICAL DATA:  OG tube placement. EXAM: PORTABLE ABDOMEN - 1 VIEW COMPARISON:  May 31, 2024 (5:09 p.m.) FINDINGS: An orogastric tube is seen with its distal tip overlying the expected region of the stomach. Its distal side hole is approximately 13.1 mm distal to the expected region of the gastroesophageal junction. The bowel gas pattern is normal. No radio-opaque calculi or other significant radiographic abnormality are seen. IMPRESSION: Orogastric tube positioning, as described above. Electronically Signed   By: Suzen Dials M.D.   On: 05/31/2024 20:05   DG CHEST PORT 1 VIEW Result Date: 05/31/2024 CLINICAL DATA:  Acute hypoxic respiratory failure. EXAM: PORTABLE CHEST 1 VIEW COMPARISON:  Radiograph earlier today FINDINGS: Endotracheal tube tip 2.6 cm from the carina. Enteric tube tip below the diaphragm not included in the field of view. Right pleural effusion is likely increasing with fluid tracking into the fissure. Developing left pleural effusion. Patchy airspace disease or edema in the left hemithorax is unchanged. IMPRESSION: 1. Endotracheal tube tip 2.6 cm from the carina. 2. Right pleural effusion is likely increasing with fluid tracking into the fissure. Developing left pleural effusion. 3. Patchy airspace disease or edema in the left hemithorax is unchanged. Electronically Signed   By: Andrea Gasman M.D.   On:  05/31/2024 17:37   DG Abd Portable 1V Result Date: 05/31/2024 CLINICAL DATA:  Orogastric tube placement. EXAM: PORTABLE ABDOMEN - 1 VIEW COMPARISON:  None Available. FINDINGS: Tip of the enteric tube is below the diaphragm in the stomach, the side port is in the region of the distal esophagus. Paucity of upper abdominal bowel gas pattern. IMPRESSION: Tip of the enteric tube below the diaphragm in the stomach, the side port is in the region of the distal esophagus. Recommend advancement of approximately 6 cm to place the side-port below the diaphragm. Electronically Signed   By: Andrea Gasman M.D.   On: 05/31/2024 17:35   ECHOCARDIOGRAM COMPLETE Result Date: 05/31/2024    ECHOCARDIOGRAM REPORT   Patient Name:   AZELL BILL Date of Exam: 05/31/2024 Medical Rec #:  [id number]       Height:       71.0 in Accession #:    [id number]      Weight:       231.5 lb Date of Birth:  04-03-1971       BSA:  2.244 m Patient Age:    53 years        BP:           120/73 mmHg Patient Gender: M               HR:           146 bpm. Exam Location:  Inpatient Procedure: 2D Echo, Color Doppler and Cardiac Doppler (Both Spectral and Color            Flow Doppler were utilized during procedure). STAT ECHO Indications:    Other abnormalities of the heart R00.8  History:        Patient has prior history of Echocardiogram examinations, most                 recent 12/18/2021. Arrythmias:Tachycardia,                 Signs/Symptoms:Shortness of Breath and Dyspnea; Risk                 Factors:Hypertension, Dyslipidemia, Sleep Apnea, Diabetes and                 Current Smoker.  Sonographer:    Koleen Popper RDCS Referring Phys: [id number] REXENE LOISE BLUSH  Sonographer Comments: Image acquisition challenging due to respiratory motion. IMPRESSIONS  1. Left ventricular ejection fraction, by estimation, is 70 to 75%. The left ventricle has hyperdynamic function. The left ventricle has no regional wall motion abnormalities. Left  ventricular diastolic parameters were normal.  2. Right ventricular systolic function is normal. The right ventricular size is normal.  3. The mitral valve is normal in structure. Trivial mitral valve regurgitation. No evidence of mitral stenosis.  4. The aortic valve is tricuspid. Aortic valve regurgitation is not visualized. Aortic valve sclerosis/calcification is present, without any evidence of aortic stenosis. FINDINGS  Left Ventricle: Left ventricular ejection fraction, by estimation, is 70 to 75%. The left ventricle has hyperdynamic function. The left ventricle has no regional wall motion abnormalities. The left ventricular internal cavity size was normal in size. There is no left ventricular hypertrophy. Left ventricular diastolic parameters were normal. Normal left ventricular filling pressure. Right Ventricle: The right ventricular size is normal. No increase in right ventricular wall thickness. Right ventricular systolic function is normal. Left Atrium: Left atrial size was normal in size. Right Atrium: Right atrial size was normal in size. Pericardium: There is no evidence of pericardial effusion. Mitral Valve: The mitral valve is normal in structure. Mild mitral annular calcification. Trivial mitral valve regurgitation. No evidence of mitral valve stenosis. Tricuspid Valve: The tricuspid valve is normal in structure. Tricuspid valve regurgitation is mild . No evidence of tricuspid stenosis. Aortic Valve: The aortic valve is tricuspid. Aortic valve regurgitation is not visualized. Aortic valve sclerosis/calcification is present, without any evidence of aortic stenosis. Pulmonic Valve: The pulmonic valve was normal in structure. Pulmonic valve regurgitation is trivial. No evidence of pulmonic stenosis. Aorta: The aortic root is normal in size and structure. Venous: The inferior vena cava was not well visualized. IAS/Shunts: No atrial level shunt detected by color flow Doppler.  LEFT VENTRICLE PLAX 2D  LVIDd:         4.90 cm   Diastology LVIDs:         3.10 cm   LV e' medial:    11.30 cm/s LV PW:         1.00 cm   LV E/e' medial:  7.6 LV IVS:  1.00 cm   LV e' lateral:   15.80 cm/s LVOT diam:     2.00 cm   LV E/e' lateral: 5.4 LV SV:         65 LV SV Index:   29 LVOT Area:     3.14 cm  RIGHT VENTRICLE             IVC RV Basal diam:  3.10 cm     IVC diam: 2.10 cm RV S prime:     20.20 cm/s TAPSE (M-mode): 2.8 cm LEFT ATRIUM             Index        RIGHT ATRIUM           Index LA diam:        3.30 cm 1.47 cm/m   RA Area:     14.80 cm LA Vol (A2C):   53.2 ml 23.71 ml/m  RA Volume:   35.70 ml  15.91 ml/m LA Vol (A4C):   50.7 ml 22.60 ml/m LA Biplane Vol: 53.8 ml 23.98 ml/m  AORTIC VALVE LVOT Vmax:   133.00 cm/s LVOT Vmean:  88.200 cm/s LVOT VTI:    0.206 m  AORTA Ao Root diam: 3.60 cm Ao Asc diam:  3.40 cm MITRAL VALVE                TRICUSPID VALVE MV Area (PHT): 10.54 cm    TR Peak grad:   30.7 mmHg MV Decel Time: 72 msec      TR Vmax:        277.00 cm/s MV E velocity: 85.40 cm/s MV A velocity: 106.00 cm/s  SHUNTS MV E/A ratio:  0.81         Systemic VTI:  0.21 m                             Systemic Diam: 2.00 cm Wilbert Bihari MD Electronically signed by Wilbert Bihari MD Signature Date/Time: 05/31/2024/1:43:45 PM    Final    DG CHEST PORT 1 VIEW Result Date: 05/31/2024 CLINICAL DATA:  Acute hypoxic respiratory failure. EXAM: PORTABLE CHEST 1 VIEW COMPARISON:  05/29/2024 and CT chest 06/07/2016. FINDINGS: Trachea is midline. Heart size stable. Diffuse mixed interstitial and airspace opacification. Large right pleural effusion. Right basilar collapse/consolidation. IMPRESSION: 1. Right basilar collapse/consolidation with diffuse airspace opacification, findings worrisome for pneumonia. Centrally obstructing mass in the right lung cannot be excluded. Followup PA and lateral chest X-ray is recommended in 3-4 weeks following trial of antibiotic therapy to ensure resolution and exclude underlying malignancy.  2. Large right pleural effusion. Electronically Signed   By: Newell Eke M.D.   On: 05/31/2024 09:35       Assessment / Plan:    #61 53 year old male with decompensated EtOH induced cirrhosis, still actively drinking, with previous diagnosis of ascites and esophageal varices (including recent variceal ligation and rebanding 1 month ago at outside hospital).  Patient had also been requiring more frequent paracenteses as an outpatient prior to this admission.  Presented to the ER generally feeling unwell with nausea poor intake complaint of some diarrhea fever.  Had not taken his meds for a few days.  Too weak to get out of bed on the day of admission  By EMS to be in SVT shocked prior to arrival in ER Hypotensive on admission, with lactic acidosis  Bedside paracentesis in the ER, cell counts not consistent with BP  #  2 large right effusion, right basilar collapse consolidation consistent with pneumonia Worsening respiratory failure /hypotension throughout the day yesterday and required intubation last evening and initiation of pressors On broad-spectrum antibiotics/Zosyn /doxycycline   #3 ascites-bedside ultrasound yesterday with only a small pocket of ascites noted not felt to be contributing to his worsening respiratory status/not enough fluid to warrant repeat paracentesis  #4 severe acute EtOH hepatitis with discriminant function score 67 on admission He has had worsening parameters since admission, not a candidate for steroids given sepsis/pneumonia  #5 history of hepatic encephalopathy-on Xifaxan  at home-continuing Xifaxan  here and added lactulose  yesterday 30 cc twice daily  #6 EtOH withdrawal  Plan; management as per CCM I spoke to the patient's mother this morning, and discussed the severity of his illness and progressive evidence of hepatic failure with very high rate of mortality with current parameters.  She voices understanding.  Unable to initiate steroids for EtOH  hepatitis in the setting of sepsis/pneumonia.  Consider initiating NAC protocol-will discuss  Discussed with pharmacy, he received vitamin K  yesterday 10 mg will give another dose today and tomorrow  Continue lactulose  and Xifaxan  via tube Continue aggressive supportive management  Principal Problem:   Hypotension Active Problems:   AKI (acute kidney injury) (HCC)   Severe sepsis (HCC)   Community acquired pneumonia   Acute hypoxic respiratory failure (HCC)   Decompensated cirrhosis (HCC)     LOS: 2 days   Verna Desrocher EsterwoodPA-C  06/01/2024, 10:00 AM

## 2024-06-01 NOTE — Progress Notes (Signed)
 NAME:  Omar Turner, MRN:  [id number], DOB:  1971/07/28, LOS: 2 ADMISSION DATE:  05/29/2024, CONSULTATION DATE:  05/30/2024 REFERRING MD:  Benton Shone, MD, CHIEF COMPLAINT:  SOB  History of Present Illness:  53 y/o male with PMH for Alcoholic Cirrhosis, Esophageal Varices s/p clipping, COPD, active drinker (1 pint Vodka daily) and smoker (1ppd), Ascites s/p multiple paracentesis, DT's, Withdrawal Seizures, recent MRI abd showing right pleural effusion.  He presented with SOB today.  He says for the last 3 days he has been very weak, not able to get out of bed much.  He has been having N/V/D.  He cannot hold much down and has not taken his meds in a couple days as a result.  He is more SOB and has a dry cough and has been wheezing.  He has an Albuterol  inhaler which he uses prn.  He also has abd pain throughout.  Apparently he was sitting on his porch and found himself to be light headed, almost passing out and fast heart rate.  EMS found him with HR 180's and gave him a 100J shock.  His HR decreased to the 160's.  He remained hypotensive.  His LA was 7.83.  A paracentesis was done in the ED and he had a paracentesis 1 week ago outpatient with about 2 liters removed.  Pertinent  Medical History  Alcoholic Cirrhosis, Esophageal Varices s/p clipping, COPD, active drinker (1 pint Vodka daily) and smoker (1ppd), Ascites s/p multiple paracentesis, DT's, Withdrawal Seizures, recent MRI abd showing right pleural effusion.    Significant Hospital Events: Including procedures, antibiotic start and stop dates in addition to other pertinent events   8/24: Admit to ICU 8/25: Off pressors, worsening hypoxia requiring intubation 8/26: Back on pressors, central line placed overnight  Interim History / Subjective:  Intubated and sedated  Objective    Blood pressure 98/70, pulse 71, temperature 98.5 F (36.9 C), temperature source Axillary, resp. rate 20, height 5' 11 (1.803 m), weight 100.8 kg, SpO2  98%.    Vent Mode: PRVC FiO2 (%):  [70 %-100 %] 70 % Set Rate:  [20 bmp] 20 bmp Vt Set:  [450 mL-600 mL] 450 mL PEEP:  [10 cmH20-14 cmH20] 14 cmH20 Plateau Pressure:  [29 cmH20-32 cmH20] 32 cmH20   Intake/Output Summary (Last 24 hours) at 06/01/2024 1434 Last data filed at 06/01/2024 1200 Gross per 24 hour  Intake 1653.33 ml  Output 925 ml  Net 728.33 ml   Filed Weights   05/30/24 0400 05/31/24 0500 06/01/24 0500  Weight: 101.2 kg 105 kg 100.8 kg    Examination: General: chronically ill appearing male, intubated and sedated HENT: normocephalic, atraumatic  Lungs: intubated, rhonchi bilaterally, diminished bilateral bases Cardiovascular: tachycardic, regular rate and rhythm, normal S1 and S2, no murmurs, rubs or gallops Abdomen: protuberant, soft, normoactive bowel sounds Extremities: warm, pitting edema in ankles, palpable pedal pulses Neuro: intubated and sedated, PERRL GU: foley in place  Resolved problem list   Assessment and Plan    Acute hypoxic respiratory failure, multifactorial in the setting of volume overload, right pleural effusion and possible pneumonia, worsening ARDS Increasing oxygen requirements 8/25 requiring intubation. CXR shows increasing diffuse bilateral opacities and right pleural effusion. Most likely aspiration vs. pulmonary edema. PF ratio 108 this morning. Bedside US  8/25 with small to moderate pleural effusion and small pocket ascites, both unlikely to be contributing to decompensation.  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 MELD score 30 on 8/25. Current drinker (about 1 pint vodka/day). Diagnostic para in ED did not look consistent with SBP. Followed by [facility] and established with [facility].  - GI consulted, appreciate recommendations - Continue octreotide , stop prednisone  given concern for infection and no improvement. Start NAC protocol.  - Holding diuresis for now per above - Continue home rifaximin   - Increase lactulose  q2h with goal 2-3 bowel movements per day - Trend daily CMP, CBC, coags with correction of coagulopathy as clinically indicated - Holding home naldolol, eplerenone  and spironolactone  with recent shock and pressor requirement - SCDs only with thrombocytopenia  AKI, worsening Hyponatremia, improving HRS versus prerenal in the setting of poor oral intake and diarrhea. Baseline Cr~0.6-0.8.  - Cr increased today, hold additional diuresis for now, will aim for even fluid balance  Hypotension Current hypotension and vasopressor requirement likely sedation related given levophed  requirement coincided with start of propofol . Possible sepsis with concern for aspiration pneumonia. Lactic acidosis resolved. - Continue midodrine   - Continue levophed  as able to maintain MAP>65 - Antibiotics per above  Hypoalbuminemia - Will order DHT placement and plan to start enteral feeding   EtOH abuse  - Continue CIWA protocol - Continue thiamine  and multivitamin   GOC - Low threshold for palliative consultation if he isn't improving in coming days   Best Practice (right click and Reselect all SmartList Selections daily)   Diet/type: NPO DVT prophylaxis SCD Pressure ulcer(s): N/A GI prophylaxis: PPI Lines: N/A Foley:  yes, and currently needs Code Status:  full code  Labs   CBC: Recent Labs  Lab 05/29/24 1826 05/29/24 1835 05/30/24 0433 05/30/24 0530 05/31/24 0459  05/31/24 1535 05/31/24 1747 05/31/24 2015 06/01/24 0518 06/01/24 0608 06/01/24 1352  WBC 9.7  --  13.2* 12.9* 15.6*  --   --   --  14.4*  --   --   NEUTROABS 7.5  --   --   --   --   --   --   --   --   --   --   HGB 13.2   < > 12.1* 12.0* 11.1*   < > 12.6* 10.5* 10.3* 11.2* 11.9*  HCT 37.3*   < > 33.5* 33.6* 30.1*   < > 37.0* 31.0* 29.0* 33.0* 35.0*  MCV 99.2  --  96.5 97.1 97.1  --   --   --  99.7  --   --   PLT 57*  --  57* 50* 46*  --   --   --  51*  --   --    < > = values in this interval not displayed.    Basic Metabolic Panel: Recent Labs  Lab 05/30/24 0433 05/30/24 0530 05/30/24 2026 05/31/24 0459 05/31/24 1302 05/31/24 1535 05/31/24 1747 05/31/24 2015 06/01/24 0518 06/01/24 0608 06/01/24 1352  NA 128* 129*  --  129* 130*   < > 132* 129* 128* 131* 131*  K 4.0 3.6  --  3.6 3.8   < > 4.1 4.5 4.1 4.1 4.2  CL 93* 94*  --  94* 92*  --   --   --  92*  --   --   CO2 18* 19*  --  26 26  --   --   --  26  --   --   GLUCOSE 70 89  --  118* 129*  --   --   --  123*  --   --   BUN 10 9  --  13 12  --   --   --  21*  --   --   CREATININE 1.16 1.26*  --  1.00 0.79  --   --   --  1.18  --   --   CALCIUM  8.4* 8.5*  --  8.6* 8.8*  --   --   --  8.9  --   --   MG 1.2*  --  2.5* 2.2  --   --   --   --   --   --   --    < > = values in this interval not displayed.   GFR: Estimated Creatinine Clearance: 89.6 mL/min (by C-G formula based on SCr of 1.18 mg/dL). Recent Labs  Lab 05/30/24 0433 05/30/24 0530 05/31/24 0459 05/31/24 1302 06/01/24 0517 06/01/24 0518  WBC 13.2* 12.9* 15.6*  --   --  14.4*  LATICACIDVEN  --  6.8* 2.3* 2.6* 1.6  --     Liver Function Tests: Recent Labs  Lab 05/29/24 1826 05/30/24 0530 05/31/24 0459 06/01/24 0518  AST 326* 464* 407* 354*  ALT 106* 142* 140* 135*  ALKPHOS 164* 137* 124 90  BILITOT 19.7* 22.1* 25.7* 28.9*  PROT 7.3 7.0 7.0 6.9  ALBUMIN  2.1* 2.1* 2.6* 3.3*   Recent Labs  Lab 05/29/24 1826  LIPASE 57*   Recent Labs  Lab  05/29/24 1827 05/30/24 0530 05/31/24 0459  AMMONIA 29 53* 59*    ABG    Component Value Date/Time   PHART 7.351 06/01/2024 1352   PCO2ART 53.0 (H) 06/01/2024 1352   PO2ART 76 (L) 06/01/2024 1352   HCO3 29.4 (H) 06/01/2024 1352   TCO2 31 06/01/2024 1352   ACIDBASEDEF 1.0 05/29/2024 1835   O2SAT 94 06/01/2024 1352     Coagulation Profile: Recent Labs  Lab 05/29/24 1826 05/30/24 0530 05/31/24 0459 06/01/24 0518  INR 2.1* 2.2* 2.7* 2.7*    Cardiac Enzymes: No results for input(s): CKTOTAL, CKMB, CKMBINDEX, TROPONINI in the last 168 hours.  HbA1C: Hgb A1c MFr Bld  Date/Time Value Ref Range Status  05/30/2024 04:33 AM 3.8 (L) 4.8 - 5.6 % Final    Comment:    (NOTE) Diagnosis of Diabetes The following HbA1c ranges recommended by the American Diabetes Association (ADA) may be used as an aid in the diagnosis of diabetes mellitus.  Hemoglobin             Suggested A1C NGSP%              Diagnosis  <5.7                   Non Diabetic  5.7-6.4                Pre-Diabetic  >6.4                   Diabetic  <7.0                   Glycemic control for  adults with diabetes.      CBG: Recent Labs  Lab 05/31/24 2110 05/31/24 2350 06/01/24 0404 06/01/24 0717 06/01/24 1110  GLUCAP 147* 141* 129* 110* 122*    CRITICAL CARE Performed by: Rexene LOISE Blush  The patient is critically ill with multiple organ system failure and requires high complexity decision making for assessment and support, frequent evaluation and titration of therapies, advanced monitoring, review of radiographic studies and interpretation of complex data.   Critical Care Time devoted to patient care services, exclusive of separately billable procedures, described in this note is 40 minutes.  Rexene LOISE Blush, PA-C [facility] 06/01/24 2:34 PM  Please see [email or web address] for pager details.  From 7A-7P if no response, please call [phone] After  hours, please call ELink [phone]

## 2024-06-02 ENCOUNTER — Inpatient Hospital Stay (HOSPITAL_COMMUNITY)

## 2024-06-02 DIAGNOSIS — K7031 Alcoholic cirrhosis of liver with ascites: Secondary | ICD-10-CM | POA: Diagnosis not present

## 2024-06-02 DIAGNOSIS — A419 Sepsis, unspecified organism: Secondary | ICD-10-CM | POA: Diagnosis not present

## 2024-06-02 DIAGNOSIS — J80 Acute respiratory distress syndrome: Secondary | ICD-10-CM | POA: Diagnosis not present

## 2024-06-02 DIAGNOSIS — E871 Hypo-osmolality and hyponatremia: Secondary | ICD-10-CM | POA: Diagnosis not present

## 2024-06-02 DIAGNOSIS — N179 Acute kidney failure, unspecified: Secondary | ICD-10-CM | POA: Diagnosis not present

## 2024-06-02 DIAGNOSIS — K7010 Alcoholic hepatitis without ascites: Secondary | ICD-10-CM | POA: Diagnosis not present

## 2024-06-02 LAB — POCT I-STAT 7, (LYTES, BLD GAS, ICA,H+H)
Acid-Base Excess: 3 mmol/L — ABNORMAL HIGH (ref 0.0–2.0)
Bicarbonate: 30.6 mmol/L — ABNORMAL HIGH (ref 20.0–28.0)
Calcium, Ion: 1.16 mmol/L (ref 1.15–1.40)
HCT: 33 % — ABNORMAL LOW (ref 39.0–52.0)
Hemoglobin: 11.2 g/dL — ABNORMAL LOW (ref 13.0–17.0)
O2 Saturation: 97 %
Patient temperature: 98.3
Potassium: 4.2 mmol/L (ref 3.5–5.1)
Sodium: 133 mmol/L — ABNORMAL LOW (ref 135–145)
TCO2: 33 mmol/L — ABNORMAL HIGH (ref 22–32)
pCO2 arterial: 63.1 mmHg — ABNORMAL HIGH (ref 32–48)
pH, Arterial: 7.293 — ABNORMAL LOW (ref 7.35–7.45)
pO2, Arterial: 103 mmHg (ref 83–108)

## 2024-06-02 LAB — GLUCOSE, CAPILLARY
Glucose-Capillary: 132 mg/dL — ABNORMAL HIGH (ref 70–99)
Glucose-Capillary: 136 mg/dL — ABNORMAL HIGH (ref 70–99)
Glucose-Capillary: 147 mg/dL — ABNORMAL HIGH (ref 70–99)
Glucose-Capillary: 151 mg/dL — ABNORMAL HIGH (ref 70–99)
Glucose-Capillary: 163 mg/dL — ABNORMAL HIGH (ref 70–99)
Glucose-Capillary: 166 mg/dL — ABNORMAL HIGH (ref 70–99)

## 2024-06-02 LAB — BASIC METABOLIC PANEL WITH GFR
Anion gap: 11 (ref 5–15)
BUN: 31 mg/dL — ABNORMAL HIGH (ref 6–20)
CO2: 27 mmol/L (ref 22–32)
Calcium: 8.9 mg/dL (ref 8.9–10.3)
Chloride: 93 mmol/L — ABNORMAL LOW (ref 98–111)
Creatinine, Ser: 1.54 mg/dL — ABNORMAL HIGH (ref 0.61–1.24)
GFR, Estimated: 54 mL/min — ABNORMAL LOW (ref >60)
Glucose, Bld: 133 mg/dL — ABNORMAL HIGH (ref 70–99)
Potassium: 4 mmol/L (ref 3.5–5.1)
Sodium: 131 mmol/L — ABNORMAL LOW (ref 135–145)

## 2024-06-02 LAB — HEPATIC FUNCTION PANEL
ALT: 136 U/L — ABNORMAL HIGH (ref 0–44)
ALT: 139 U/L — ABNORMAL HIGH (ref 0–44)
AST: 293 U/L — ABNORMAL HIGH (ref 15–41)
AST: 299 U/L — ABNORMAL HIGH (ref 15–41)
Albumin: 3 g/dL — ABNORMAL LOW (ref 3.5–5.0)
Albumin: 3 g/dL — ABNORMAL LOW (ref 3.5–5.0)
Alkaline Phosphatase: 92 U/L (ref 38–126)
Alkaline Phosphatase: 101 U/L (ref 38–126)
Bilirubin, Direct: 15.2 mg/dL — ABNORMAL HIGH (ref 0.0–0.2)
Bilirubin, Direct: 15.5 mg/dL — ABNORMAL HIGH (ref 0.0–0.2)
Indirect Bilirubin: 12.2 mg/dL — ABNORMAL HIGH (ref 0.3–0.9)
Indirect Bilirubin: 13 mg/dL — ABNORMAL HIGH (ref 0.3–0.9)
Total Bilirubin: 27.4 mg/dL (ref 0.0–1.2)
Total Bilirubin: 28.5 mg/dL (ref 0.0–1.2)
Total Protein: 6.7 g/dL (ref 6.5–8.1)
Total Protein: 6.6 g/dL (ref 6.5–8.1)

## 2024-06-02 LAB — AMMONIA: Ammonia: 59 umol/L — ABNORMAL HIGH (ref 9–35)

## 2024-06-02 LAB — MAGNESIUM: Magnesium: 2.2 mg/dL (ref 1.7–2.4)

## 2024-06-02 LAB — PROTIME-INR
INR: 2.3 — ABNORMAL HIGH (ref 0.8–1.2)
Prothrombin Time: 26.1 s — ABNORMAL HIGH (ref 11.4–15.2)

## 2024-06-02 LAB — PHOSPHORUS: Phosphorus: 5.5 mg/dL — ABNORMAL HIGH (ref 2.5–4.6)

## 2024-06-02 MED ORDER — VITAL 1.5 CAL PO LIQD
20.0000 mL | ORAL | Status: DC
  Filled 2024-06-02 (×2): qty 237

## 2024-06-02 MED ORDER — HYDROCORTISONE SOD SUC (PF) 100 MG IJ SOLR
100.0000 mg | Freq: Three times a day (TID) | INTRAMUSCULAR | Status: DC
  Administered 2024-06-02 – 2024-06-06 (×12): 100 mg via INTRAVENOUS
  Filled 2024-06-02 (×12): qty 2

## 2024-06-02 MED ORDER — VITAL 1.5 CAL PO LIQD: 1000.0000 mL | ORAL | Status: DC

## 2024-06-02 MED ORDER — FUROSEMIDE 10 MG/ML IJ SOLN
120.0000 mg | Freq: Once | INTRAVENOUS | Status: AC
  Administered 2024-06-02: 120 mg via INTRAVENOUS
  Filled 2024-06-02: qty 10

## 2024-06-02 MED ORDER — SENNOSIDES 8.8 MG/5ML PO SYRP
5.0000 mL | ORAL_SOLUTION | Freq: Two times a day (BID) | ORAL | Status: DC
  Administered 2024-06-02: 5 mL
  Filled 2024-06-02: qty 5

## 2024-06-02 MED ORDER — LACTULOSE 10 GM/15ML PO SOLN
20.0000 g | ORAL | Status: DC
  Administered 2024-06-02 – 2024-06-03 (×3): 20 g
  Filled 2024-06-02 (×3): qty 30

## 2024-06-02 MED ORDER — VASOPRESSIN 20 UNITS/100 ML INFUSION FOR SHOCK
0.0000 [IU]/min | INTRAVENOUS | Status: DC
  Administered 2024-06-02 – 2024-06-04 (×5): 0.03 [IU]/min via INTRAVENOUS
  Filled 2024-06-02 (×5): qty 100

## 2024-06-02 NOTE — Procedures (Signed)
 Arterial Line Insertion Start/End8/27/2025 3:40 AM, 06/02/2024 3:47 AM  Patient location: ICU. Preanesthetic checklist: patient identified, IV checked, site marked, risks and benefits discussed, surgical consent, monitors and equipment checked, pre-op evaluation and timeout performed Right, radial was placed Catheter size: 20 G Hand hygiene performed , maximum sterile barriers used  and Seldinger technique used Allen's test indicative of satisfactory collateral circulation Attempts: 1 Procedure performed without using ultrasound guided technique. Following insertion, dressing applied and Biopatch. Post procedure assessment: normal  Patient tolerated the procedure well with no immediate complications.

## 2024-06-02 NOTE — Progress Notes (Signed)
 Patient continuously dyssynchronous with vent sedation increased ABG ordered.

## 2024-06-02 NOTE — Progress Notes (Signed)
 eLink Physician-Brief Progress Note Patient Name: Omar Turner DOB: 1971-08-21 MRN: [id number]   Date of Service  06/02/2024  HPI/Events of Note  ABG result reviewed.  eICU Interventions  Respiratory rate increased to 28.        Omar Turner 06/02/2024, 4:44 AM

## 2024-06-02 NOTE — Progress Notes (Signed)
 Progress Note   Subjective  Remains intubated and on pressors. NAC started yesterday. No interval changes.    Objective   Vital signs in last 24 hours: Temp:  [98 F (36.7 C)-98.7 F (37.1 C)] 98.3 F (36.8 C) (08/27 0722) Pulse Rate:  [71-115] 91 (08/27 0812) Resp:  [10-28] 28 (08/27 0700) BP: (87-120)/(52-79) 104/52 (08/27 0812) SpO2:  [90 %-100 %] 98 % (08/27 0700) Arterial Line BP: (93-112)/(49-58) 96/49 (08/27 0700) FiO2 (%):  [60 %-100 %] 90 % (08/27 0831) Last BM Date : 05/30/24 General:    white male intubated and sedated  Intake/Output from previous day: 08/26 0701 - 08/27 0700 In: 2956.6 [I.V.:2092.7; NG/GT:203.3; IV Piggyback:660.6] Out: 725 [Urine:725] Intake/Output this shift: Total I/O In: -  Out: 100 [Urine:100]  Lab Results: Recent Labs    05/31/24 0459 05/31/24 1535 06/01/24 0518 06/01/24 0608 06/01/24 1352 06/02/24 0509  WBC 15.6*  --  14.4*  --   --   --   HGB 11.1*   < > 10.3* 11.2* 11.9* 11.2*  HCT 30.1*   < > 29.0* 33.0* 35.0* 33.0*  PLT 46*  --  51*  --   --   --    < > = values in this interval not displayed.   BMET Recent Labs    06/01/24 0518 06/01/24 0608 06/01/24 1500 06/02/24 0509 06/02/24 0615  NA 128*   < > 126* 133* 131*  K 4.1   < > 4.1 4.2 4.0  CL 92*  --  88*  --  93*  CO2 26  --  26  --  27  GLUCOSE 123*  --  172*  --  133*  BUN 21*  --  25*  --  31*  CREATININE 1.18  --  1.09  --  1.54*  CALCIUM  8.9  --  8.4*  --  8.9   < > = values in this interval not displayed.   LFT Recent Labs    06/02/24 0615  PROT 6.7  ALBUMIN  3.0*  AST 293*  ALT 136*  ALKPHOS 92  BILITOT 27.4*  BILIDIR 15.2*  IBILI 12.2*   PT/INR Recent Labs    06/01/24 0518 06/02/24 0630  LABPROT 30.1* 26.1*  INR 2.7* 2.3*    Studies/Results: DG CHEST PORT 1 VIEW Result Date: 06/02/2024 CLINICAL DATA:  Acute hypoxic respiratory failure. EXAM: PORTABLE CHEST 1 VIEW COMPARISON:  June 01, 2024. FINDINGS: Stable  cardiomediastinal silhouette. Endotracheal and nasogastric tubes are unchanged. Left internal jugular catheter is unchanged. Moderate right pleural effusion is noted with increased right upper lobe pneumonia or edema. Stable left lung opacity is noted concerning for pneumonia or edema. Bony thorax is unremarkable. IMPRESSION: Stable support apparatus. Moderate right pleural effusion is noted with increased right upper lobe pneumonia or edema. Stable left lung opacity is noted concerning for pneumonia or edema. Electronically Signed   By: Lynwood Landy Raddle M.D.   On: 06/02/2024 08:22   DG CHEST PORT 1 VIEW Result Date: 06/01/2024 CLINICAL DATA:  Respiratory failure EXAM: PORTABLE CHEST 1 VIEW COMPARISON:  05/31/2024 FINDINGS: No significant change in AP portable chest radiograph. Endotracheal tube, esophagogastric tube, and left neck vascular catheter remain in unchanged, satisfactory position. Heart size is normal. Diffuse bilateral heterogeneous and consolidative airspace opacity with layering right pleural effusion. No acute osseous findings. IMPRESSION: 1. No significant change in AP portable chest radiograph. Endotracheal tube, esophagogastric tube, and left neck vascular catheter remain in unchanged, satisfactory position. 2.  Diffuse bilateral heterogeneous and consolidative airspace opacity with layering right pleural effusion. Electronically Signed   By: Marolyn JONETTA Jaksch M.D.   On: 06/01/2024 06:53   DG CHEST PORT 1 VIEW Result Date: 05/31/2024 CLINICAL DATA:  Central line placement EXAM: PORTABLE CHEST 1 VIEW COMPARISON:  05/31/2024 FINDINGS: Endotracheal tube is present with tip measuring 5.6 cm above the carina. Enteric tube is present. Tip is off the field of view but below the left hemidiaphragm. Left central venous catheter has been placed with tip over the cavoatrial junction region. No pneumothorax. Heart size is normal. Bilateral perihilar infiltrates with basilar consolidation or atelectasis on the  right. Moderate right pleural effusion. Similar appearance to previous study. IMPRESSION: 1. Appliances appear in satisfactory position. Left central venous catheter with tip over the cavoatrial junction region without pneumothorax. 2. Persistent perihilar infiltrates. Persistent right pleural effusion with basilar atelectasis or consolidation. Electronically Signed   By: Elsie Gravely M.D.   On: 05/31/2024 23:14   DG Abd Portable 1V Result Date: 05/31/2024 CLINICAL DATA:  OG tube placement. EXAM: PORTABLE ABDOMEN - 1 VIEW COMPARISON:  May 31, 2024 (5:09 p.m.) FINDINGS: An orogastric tube is seen with its distal tip overlying the expected region of the stomach. Its distal side hole is approximately 13.1 mm distal to the expected region of the gastroesophageal junction. The bowel gas pattern is normal. No radio-opaque calculi or other significant radiographic abnormality are seen. IMPRESSION: Orogastric tube positioning, as described above. Electronically Signed   By: Suzen Dials M.D.   On: 05/31/2024 20:05   DG CHEST PORT 1 VIEW Result Date: 05/31/2024 CLINICAL DATA:  Acute hypoxic respiratory failure. EXAM: PORTABLE CHEST 1 VIEW COMPARISON:  Radiograph earlier today FINDINGS: Endotracheal tube tip 2.6 cm from the carina. Enteric tube tip below the diaphragm not included in the field of view. Right pleural effusion is likely increasing with fluid tracking into the fissure. Developing left pleural effusion. Patchy airspace disease or edema in the left hemithorax is unchanged. IMPRESSION: 1. Endotracheal tube tip 2.6 cm from the carina. 2. Right pleural effusion is likely increasing with fluid tracking into the fissure. Developing left pleural effusion. 3. Patchy airspace disease or edema in the left hemithorax is unchanged. Electronically Signed   By: Andrea Gasman M.D.   On: 05/31/2024 17:37   DG Abd Portable 1V Result Date: 05/31/2024 CLINICAL DATA:  Orogastric tube placement. EXAM: PORTABLE  ABDOMEN - 1 VIEW COMPARISON:  None Available. FINDINGS: Tip of the enteric tube is below the diaphragm in the stomach, the side port is in the region of the distal esophagus. Paucity of upper abdominal bowel gas pattern. IMPRESSION: Tip of the enteric tube below the diaphragm in the stomach, the side port is in the region of the distal esophagus. Recommend advancement of approximately 6 cm to place the side-port below the diaphragm. Electronically Signed   By: Andrea Gasman M.D.   On: 05/31/2024 17:35   ECHOCARDIOGRAM COMPLETE Result Date: 05/31/2024    ECHOCARDIOGRAM REPORT   Patient Name:   Omar Turner Date of Exam: 05/31/2024 Medical Rec #:  [id number]       Height:       71.0 in Accession #:    [id number]      Weight:       231.5 lb Date of Birth:  [birth year]       BSA:          2.244 m Patient Age:    53 years  BP:           120/73 mmHg Patient Gender: M               HR:           146 bpm. Exam Location:  Inpatient Procedure: 2D Echo, Color Doppler and Cardiac Doppler (Both Spectral and Color            Flow Doppler were utilized during procedure). STAT ECHO Indications:    Other abnormalities of the heart R00.8  History:        Patient has prior history of Echocardiogram examinations, most                 recent 12/18/2021. Arrythmias:Tachycardia,                 Signs/Symptoms:Shortness of Breath and Dyspnea; Risk                 Factors:Hypertension, Dyslipidemia, Sleep Apnea, Diabetes and                 Current Smoker.  Sonographer:    Koleen Popper RDCS Referring Phys: [id number] REXENE LOISE BLUSH  Sonographer Comments: Image acquisition challenging due to respiratory motion. IMPRESSIONS  1. Left ventricular ejection fraction, by estimation, is 70 to 75%. The left ventricle has hyperdynamic function. The left ventricle has no regional wall motion abnormalities. Left ventricular diastolic parameters were normal.  2. Right ventricular systolic function is normal. The right ventricular size is  normal.  3. The mitral valve is normal in structure. Trivial mitral valve regurgitation. No evidence of mitral stenosis.  4. The aortic valve is tricuspid. Aortic valve regurgitation is not visualized. Aortic valve sclerosis/calcification is present, without any evidence of aortic stenosis. FINDINGS  Left Ventricle: Left ventricular ejection fraction, by estimation, is 70 to 75%. The left ventricle has hyperdynamic function. The left ventricle has no regional wall motion abnormalities. The left ventricular internal cavity size was normal in size. There is no left ventricular hypertrophy. Left ventricular diastolic parameters were normal. Normal left ventricular filling pressure. Right Ventricle: The right ventricular size is normal. No increase in right ventricular wall thickness. Right ventricular systolic function is normal. Left Atrium: Left atrial size was normal in size. Right Atrium: Right atrial size was normal in size. Pericardium: There is no evidence of pericardial effusion. Mitral Valve: The mitral valve is normal in structure. Mild mitral annular calcification. Trivial mitral valve regurgitation. No evidence of mitral valve stenosis. Tricuspid Valve: The tricuspid valve is normal in structure. Tricuspid valve regurgitation is mild . No evidence of tricuspid stenosis. Aortic Valve: The aortic valve is tricuspid. Aortic valve regurgitation is not visualized. Aortic valve sclerosis/calcification is present, without any evidence of aortic stenosis. Pulmonic Valve: The pulmonic valve was normal in structure. Pulmonic valve regurgitation is trivial. No evidence of pulmonic stenosis. Aorta: The aortic root is normal in size and structure. Venous: The inferior vena cava was not well visualized. IAS/Shunts: No atrial level shunt detected by color flow Doppler.  LEFT VENTRICLE PLAX 2D LVIDd:         4.90 cm   Diastology LVIDs:         3.10 cm   LV e' medial:    11.30 cm/s LV PW:         1.00 cm   LV E/e' medial:   7.6 LV IVS:        1.00 cm   LV e' lateral:   15.80 cm/s LVOT  diam:     2.00 cm   LV E/e' lateral: 5.4 LV SV:         65 LV SV Index:   29 LVOT Area:     3.14 cm  RIGHT VENTRICLE             IVC RV Basal diam:  3.10 cm     IVC diam: 2.10 cm RV S prime:     20.20 cm/s TAPSE (M-mode): 2.8 cm LEFT ATRIUM             Index        RIGHT ATRIUM           Index LA diam:        3.30 cm 1.47 cm/m   RA Area:     14.80 cm LA Vol (A2C):   53.2 ml 23.71 ml/m  RA Volume:   35.70 ml  15.91 ml/m LA Vol (A4C):   50.7 ml 22.60 ml/m LA Biplane Vol: 53.8 ml 23.98 ml/m  AORTIC VALVE LVOT Vmax:   133.00 cm/s LVOT Vmean:  88.200 cm/s LVOT VTI:    0.206 m  AORTA Ao Root diam: 3.60 cm Ao Asc diam:  3.40 cm MITRAL VALVE                TRICUSPID VALVE MV Area (PHT): 10.54 cm    TR Peak grad:   30.7 mmHg MV Decel Time: 72 msec      TR Vmax:        277.00 cm/s MV E velocity: 85.40 cm/s MV A velocity: 106.00 cm/s  SHUNTS MV E/A ratio:  0.81         Systemic VTI:  0.21 m                             Systemic Diam: 2.00 cm Wilbert Bihari MD Electronically signed by Wilbert Bihari MD Signature Date/Time: 05/31/2024/1:43:45 PM    Final    DG CHEST PORT 1 VIEW Result Date: 05/31/2024 CLINICAL DATA:  Acute hypoxic respiratory failure. EXAM: PORTABLE CHEST 1 VIEW COMPARISON:  05/29/2024 and CT chest 06/07/2016. FINDINGS: Trachea is midline. Heart size stable. Diffuse mixed interstitial and airspace opacification. Large right pleural effusion. Right basilar collapse/consolidation. IMPRESSION: 1. Right basilar collapse/consolidation with diffuse airspace opacification, findings worrisome for pneumonia. Centrally obstructing mass in the right lung cannot be excluded. Followup PA and lateral chest X-ray is recommended in 3-4 weeks following trial of antibiotic therapy to ensure resolution and exclude underlying malignancy. 2. Large right pleural effusion. Electronically Signed   By: Newell Eke M.D.   On: 05/31/2024 09:35       Assessment /  Plan:    53 y/o male here with the following:  Liver failure - decompensated cirrhosis + alcoholic hepatitis Respiratory failure - secondary to pneumonia / sepsis AKI  He remains critically ill with high risk for mortality during this admission. Again suspect alcoholic hepatitis on top of decompensated cirrhosis, in the setting of severe pneumonia / ARDS. He has had significant alcohol intake recently and is not a transplant candidate. SBP unlikely, ruled out on admission. Recent imaging shows a patent biliary tree and no mass lesions.  Continue supportive care. NAC infusion started yesterday, and steroids stopped, for alcoholic hepatitis in the setting of infection. INR and bilirubin stable today compared to yesterday, but renal function is worse. Continue to trend these daily. Continue lactulose  / rifaximin .  No other new  recommendations at this time. Will follow peripherally for now, let us  know if any specific questions we can help with in the interim.  Marcey Naval, MD [facility]

## 2024-06-02 NOTE — Progress Notes (Signed)
 NAME:  Omar Turner, MRN:  [id number], DOB:  1971/08/15, LOS: 3 ADMISSION DATE:  05/29/2024, CONSULTATION DATE:  05/30/2024 REFERRING MD:  Benton Shone, MD, CHIEF COMPLAINT:  SOB  History of Present Illness:  53 y/o male with PMH for Alcoholic Cirrhosis, Esophageal Varices s/p clipping, COPD, active drinker (1 pint Vodka daily) and smoker (1ppd), Ascites s/p multiple paracentesis, DT's, Withdrawal Seizures, recent MRI abd showing right pleural effusion.  He presented with SOB today.  He says for the last 3 days he has been very weak, not able to get out of bed much.  He has been having N/V/D.  He cannot hold much down and has not taken his meds in a couple days as a result.  He is more SOB and has a dry cough and has been wheezing.  He has an Albuterol  inhaler which he uses prn.  He also has abd pain throughout.  Apparently he was sitting on his porch and found himself to be light headed, almost passing out and fast heart rate.  EMS found him with HR 180's and gave him a 100J shock.  His HR decreased to the 160's.  He remained hypotensive.  His LA was 7.83.  A paracentesis was done in the ED and he had a paracentesis 1 week ago outpatient with about 2 liters removed.  Pertinent  Medical History  Alcoholic Cirrhosis, Esophageal Varices s/p clipping, COPD, active drinker (1 pint Vodka daily) and smoker (1ppd), Ascites s/p multiple paracentesis, DT's, Withdrawal Seizures, recent MRI abd showing right pleural effusion.    Significant Hospital Events: Including procedures, antibiotic start and stop dates in addition to other pertinent events   8/24: Admit to ICU 8/25: Off pressors, worsening hypoxia requiring intubation 8/26: Back on pressors, central line placed overnight  Interim History / Subjective:  Intubated and sedated  Objective    Blood pressure (!) 104/52, pulse 85, temperature 97.7 F (36.5 C), temperature source Axillary, resp. rate (!) 28, height 5' 11 (1.803 m), weight 100.8  kg, SpO2 99%.    Vent Mode: PRVC FiO2 (%):  [60 %-100 %] 90 % Set Rate:  [20 bmp-28 bmp] 28 bmp Vt Set:  [450 mL] 450 mL PEEP:  [14 cmH20] 14 cmH20 Plateau Pressure:  [21 cmH20-31 cmH20] 31 cmH20   Intake/Output Summary (Last 24 hours) at 06/02/2024 1211 Last data filed at 06/02/2024 1131 Gross per 24 hour  Intake 2800.07 ml  Output 1700 ml  Net 1100.07 ml   Filed Weights   05/30/24 0400 05/31/24 0500 06/01/24 0500  Weight: 101.2 kg 105 kg 100.8 kg    Examination: General: chronically ill appearing male, intubated and sedated HENT: normocephalic, atraumatic  Lungs: intubated, rhonchi bilaterally, diminished bilateral bases Cardiovascular: tachycardic, regular rate and rhythm, normal S1 and S2, no murmurs, rubs or gallops Abdomen: protuberant, soft, normoactive bowel sounds Extremities: warm, pitting edema in ankles, palpable pedal pulses Neuro: intubated and sedated, PERRL GU: foley in place  Resolved problem list   Assessment and Plan   Acute hypoxic respiratory failure, due to ARDS in the setting of aspiration, worsening Possible CAP Increasing oxygen requirements 8/25 requiring intubation. CXR shows increasing diffuse bilateral opacities and right pleural effusion. Most likely aspiration vs. pulmonary edema, however no improvement in respiratory status with aggressive diuresis. Bedside US  8/25 with small to moderate pleural effusion and small pocket ascites, both unlikely to be contributing to decompensation.  - Continue lung protective ventilation  - Wean FiO2 as able to maintain SpO2>90% - Follow-up  sputum culture - Continue zosyn  for aspiration pneumonia and doxycycline  for atypical coverage - Diuresis with 120 mg IV Lasix  with goal negative fluid balance as tolerated  Decompensated alcoholic cirrhosis with recurrent ascites Likely decompensated alcoholic hepatitis Portal hypertensive gastropathy History of esophageal varices  Associated hyperammonemia,  hyperbilirubinemia, coagulopathy, thrombocytopenia, and elevated LFTs MELD score 30 on 8/25. Current drinker (about 1 pint vodka/day). Diagnostic para in ED did not look consistent with SBP. Followed by [facility] and established with [facility].  - GI following peripherally - Continue octreotide  and NAC protocol per GI - Holding diuresis for now per above - Continue home rifaximin   - Continue lactulose  with goal 2-3 bowel movements per day - Trend daily CMP, CBC, coags with correction of coagulopathy as clinically indicated - Holding home naldolol, eplerenone  and spironolactone  with shock and pressor requirement - SCDs only with thrombocytopenia  Oliguric AKI, worsening Hyponatremia, stable  Acute renal failure likely multifactorial in the setting of hypoxic respiratory failure, shock and decompensated cirrhosis. Baseline Cr~0.6-0.8.  - Diuresis with 120 mg IV Lasix   - Continue to trend daily BMP - Avoid nephrotoxins, renally dose medications and ensure adequate renal perfusion   Hypotension Current hypotension and vasopressor requirement likely sedation related given levophed  requirement coincided with start of propofol  vs. septic in the setting of aspiration pneumonia/ARDs. - Continue midodrine   - Continue levophed  and vasopressin  to maintain MAP>65 - Antibiotics per above - Will start stress dose steroids, hydrocortisone  100q6h  Hypoalbuminemia - Plan to start enteral feeding via OGT  EtOH abuse  - Continue thiamine  and multivitamin   Best Practice (right click and Reselect all SmartList Selections daily)   Diet/type: NPO DVT prophylaxis SCD Pressure ulcer(s): N/A GI prophylaxis: PPI Lines: Central line and Arterial Line, still needed Foley:  yes, and currently needs Code Status:  full code  Labs   CBC: Recent Labs  Lab 05/29/24 1826 05/29/24 1835 05/30/24 0433 05/30/24 0530 05/31/24 0459 05/31/24 1535 05/31/24 2015 06/01/24 0518  06/01/24 0608 06/01/24 1352 06/02/24 0509  WBC 9.7  --  13.2* 12.9* 15.6*  --   --  14.4*  --   --   --   NEUTROABS 7.5  --   --   --   --   --   --   --   --   --   --   HGB 13.2   < > 12.1* 12.0* 11.1*   < > 10.5* 10.3* 11.2* 11.9* 11.2*  HCT 37.3*   < > 33.5* 33.6* 30.1*   < > 31.0* 29.0* 33.0* 35.0* 33.0*  MCV 99.2  --  96.5 97.1 97.1  --   --  99.7  --   --   --   PLT 57*  --  57* 50* 46*  --   --  51*  --   --   --    < > = values in this interval not displayed.    Basic Metabolic Panel: Recent Labs  Lab 05/30/24 0433 05/30/24 0530 05/30/24 2026 05/31/24 0459 05/31/24 1302 05/31/24 1535 06/01/24 0518 06/01/24 0608 06/01/24 1352 06/01/24 1500 06/02/24 0509 06/02/24 0615  NA 128*   < >  --  129* 130*   < > 128* 131* 131* 126* 133* 131*  K 4.0   < >  --  3.6 3.8   < > 4.1 4.1 4.2 4.1 4.2 4.0  CL 93*   < >  --  94* 92*  --  92*  --   --  88*  --  93*  CO2 18*   < >  --  26 26  --  26  --   --  26  --  27  GLUCOSE 70   < >  --  118* 129*  --  123*  --   --  172*  --  133*  BUN 10   < >  --  13 12  --  21*  --   --  25*  --  31*  CREATININE 1.16   < >  --  1.00 0.79  --  1.18  --   --  1.09  --  1.54*  CALCIUM  8.4*   < >  --  8.6* 8.8*  --  8.9  --   --  8.4*  --  8.9  MG 1.2*  --  2.5* 2.2  --   --   --   --   --  2.0  --  2.2  PHOS  --   --   --   --   --   --   --   --   --  4.0  --  5.5*   < > = values in this interval not displayed.   GFR: Estimated Creatinine Clearance: 68.6 mL/min (A) (by C-G formula based on SCr of 1.54 mg/dL (H)). Recent Labs  Lab 05/30/24 0433 05/30/24 0530 05/31/24 0459 05/31/24 1302 06/01/24 0517 06/01/24 0518  WBC 13.2* 12.9* 15.6*  --   --  14.4*  LATICACIDVEN  --  6.8* 2.3* 2.6* 1.6  --     Liver Function Tests: Recent Labs  Lab 05/30/24 0530 05/31/24 0459 06/01/24 0518 06/02/24 0615 06/02/24 0641  AST 464* 407* 354* 293* 299*  ALT 142* 140* 135* 136* 139*  ALKPHOS 137* 124 90 92 101  BILITOT 22.1* 25.7* 28.9* 27.4*  28.5*  PROT 7.0 7.0 6.9 6.7 6.6  ALBUMIN  2.1* 2.6* 3.3* 3.0* 3.0*   Recent Labs  Lab 05/29/24 1826  LIPASE 57*   Recent Labs  Lab 05/29/24 1827 05/30/24 0530 05/31/24 0459  AMMONIA 29 53* 59*    ABG    Component Value Date/Time   PHART 7.293 (L) 06/02/2024 0509   PCO2ART 63.1 (H) 06/02/2024 0509   PO2ART 103 06/02/2024 0509   HCO3 30.6 (H) 06/02/2024 0509   TCO2 33 (H) 06/02/2024 0509   ACIDBASEDEF 1.0 05/29/2024 1835   O2SAT 97 06/02/2024 0509     Coagulation Profile: Recent Labs  Lab 05/29/24 1826 05/30/24 0530 05/31/24 0459 06/01/24 0518 06/02/24 0630  INR 2.1* 2.2* 2.7* 2.7* 2.3*    Cardiac Enzymes: No results for input(s): CKTOTAL, CKMB, CKMBINDEX, TROPONINI in the last 168 hours.  HbA1C: Hgb A1c MFr Bld  Date/Time Value Ref Range Status  05/30/2024 04:33 AM 3.8 (L) 4.8 - 5.6 % Final    Comment:    (NOTE) Diagnosis of Diabetes The following HbA1c ranges recommended by the American Diabetes Association (ADA) may be used as an aid in the diagnosis of diabetes mellitus.  Hemoglobin             Suggested A1C NGSP%              Diagnosis  <5.7                   Non Diabetic  5.7-6.4                Pre-Diabetic  >6.4  Diabetic  <7.0                   Glycemic control for                       adults with diabetes.      CBG: Recent Labs  Lab 06/01/24 1946 06/02/24 0040 06/02/24 0414 06/02/24 0720 06/02/24 1117  GLUCAP 130* 136* 132* 147* 163*    CRITICAL CARE Performed by: Rexene LOISE Blush  The patient is critically ill with multiple organ system failure and requires high complexity decision making for assessment and support, frequent evaluation and titration of therapies, advanced monitoring, review of radiographic studies and interpretation of complex data.   Critical Care Time devoted to patient care services, exclusive of separately billable procedures, described in this note is 38 minutes.  Rexene LOISE Blush,  PA-C [facility] 06/02/24 12:11 PM  Please see [email or web address] for pager details.  From 7A-7P if no response, please call [phone] After hours, please call ELink [phone]

## 2024-06-02 NOTE — Progress Notes (Signed)
 Cortrak Tube Team Note:  Consult received to place a Cortrak feeding tube.   INR this morning 2.3. Cortrak cut off 2.0. Conferred with attending. Plan to hold on Cortrak placement for now. Patient with OGT for enteral access.   MD to re-consult if Cotrak placement desired.   Blair Deaner MS, RD, LDN Registered Dietitian Clinical Nutrition RD Inpatient Contact Info in Amion

## 2024-06-02 NOTE — Progress Notes (Addendum)
 ISTAT blood gas not crossing over into patient chart:  03:43 AM 06/02/2024  pH: 7.23  PCO2: 73 PO2: 79 HCO3: 31

## 2024-06-03 ENCOUNTER — Inpatient Hospital Stay (HOSPITAL_COMMUNITY)

## 2024-06-03 DIAGNOSIS — Z7189 Other specified counseling: Secondary | ICD-10-CM

## 2024-06-03 DIAGNOSIS — E871 Hypo-osmolality and hyponatremia: Secondary | ICD-10-CM | POA: Diagnosis not present

## 2024-06-03 DIAGNOSIS — J80 Acute respiratory distress syndrome: Secondary | ICD-10-CM | POA: Diagnosis not present

## 2024-06-03 DIAGNOSIS — K7031 Alcoholic cirrhosis of liver with ascites: Secondary | ICD-10-CM | POA: Diagnosis not present

## 2024-06-03 DIAGNOSIS — N179 Acute kidney failure, unspecified: Secondary | ICD-10-CM | POA: Diagnosis not present

## 2024-06-03 LAB — COMPREHENSIVE METABOLIC PANEL WITH GFR
ALT: 131 U/L — ABNORMAL HIGH (ref 0–44)
AST: 249 U/L — ABNORMAL HIGH (ref 15–41)
Albumin: 2.6 g/dL — ABNORMAL LOW (ref 3.5–5.0)
Alkaline Phosphatase: 100 U/L (ref 38–126)
Anion gap: 11 (ref 5–15)
BUN: 29 mg/dL — ABNORMAL HIGH (ref 6–20)
CO2: 30 mmol/L (ref 22–32)
Calcium: 8.8 mg/dL — ABNORMAL LOW (ref 8.9–10.3)
Chloride: 96 mmol/L — ABNORMAL LOW (ref 98–111)
Creatinine, Ser: 0.81 mg/dL (ref 0.61–1.24)
GFR, Estimated: >60 mL/min (ref >60)
Glucose, Bld: 151 mg/dL — ABNORMAL HIGH (ref 70–99)
Potassium: 3.4 mmol/L — ABNORMAL LOW (ref 3.5–5.1)
Sodium: 137 mmol/L (ref 135–145)
Total Bilirubin: 25.6 mg/dL (ref 0.0–1.2)
Total Protein: 6.4 g/dL — ABNORMAL LOW (ref 6.5–8.1)

## 2024-06-03 LAB — POCT I-STAT 7, (LYTES, BLD GAS, ICA,H+H)
Acid-Base Excess: 2 mmol/L (ref 0.0–2.0)
Bicarbonate: 31.1 mmol/L — ABNORMAL HIGH (ref 20.0–28.0)
Calcium, Ion: 1.17 mmol/L (ref 1.15–1.40)
HCT: 33 % — ABNORMAL LOW (ref 39.0–52.0)
Hemoglobin: 11.2 g/dL — ABNORMAL LOW (ref 13.0–17.0)
O2 Saturation: 92 %
Patient temperature: 99.4
Potassium: 4.2 mmol/L (ref 3.5–5.1)
Sodium: 134 mmol/L — ABNORMAL LOW (ref 135–145)
TCO2: 33 mmol/L — ABNORMAL HIGH (ref 22–32)
pCO2 arterial: 73.3 mmHg (ref 32–48)
pH, Arterial: 7.237 — ABNORMAL LOW (ref 7.35–7.45)
pO2, Arterial: 79 mmHg — ABNORMAL LOW (ref 83–108)

## 2024-06-03 LAB — CBC
HCT: 30.2 % — ABNORMAL LOW (ref 39.0–52.0)
Hemoglobin: 10.7 g/dL — ABNORMAL LOW (ref 13.0–17.0)
MCH: 35.5 pg — ABNORMAL HIGH (ref 26.0–34.0)
MCHC: 35.4 g/dL (ref 30.0–36.0)
MCV: 100.3 fL — ABNORMAL HIGH (ref 80.0–100.0)
Platelets: 43 K/uL — ABNORMAL LOW (ref 150–400)
RBC: 3.01 MIL/uL — ABNORMAL LOW (ref 4.22–5.81)
RDW: 16.9 % — ABNORMAL HIGH (ref 11.5–15.5)
WBC: 8.3 K/uL (ref 4.0–10.5)
nRBC: 0 % (ref 0.0–0.2)

## 2024-06-03 LAB — PHOSPHORUS: Phosphorus: 3.1 mg/dL (ref 2.5–4.6)

## 2024-06-03 LAB — MAGNESIUM: Magnesium: 1.8 mg/dL (ref 1.7–2.4)

## 2024-06-03 LAB — GLUCOSE, CAPILLARY
Glucose-Capillary: 137 mg/dL — ABNORMAL HIGH (ref 70–99)
Glucose-Capillary: 137 mg/dL — ABNORMAL HIGH (ref 70–99)
Glucose-Capillary: 147 mg/dL — ABNORMAL HIGH (ref 70–99)
Glucose-Capillary: 149 mg/dL — ABNORMAL HIGH (ref 70–99)
Glucose-Capillary: 151 mg/dL — ABNORMAL HIGH (ref 70–99)
Glucose-Capillary: 161 mg/dL — ABNORMAL HIGH (ref 70–99)
Glucose-Capillary: 174 mg/dL — ABNORMAL HIGH (ref 70–99)

## 2024-06-03 LAB — PROTIME-INR
INR: 2.4 — ABNORMAL HIGH (ref 0.8–1.2)
Prothrombin Time: 27.1 s — ABNORMAL HIGH (ref 11.4–15.2)

## 2024-06-03 MED ORDER — RIFAXIMIN 550 MG PO TABS
550.0000 mg | ORAL_TABLET | Freq: Two times a day (BID) | ORAL | Status: DC
  Administered 2024-06-03 – 2024-06-08 (×10): 550 mg
  Filled 2024-06-03 (×10): qty 1

## 2024-06-03 MED ORDER — THIAMINE HCL 100 MG/ML IJ SOLN
200.0000 mg | INTRAVENOUS | Status: DC
  Filled 2024-06-03 (×5): qty 2

## 2024-06-03 MED ORDER — FREE WATER
100.0000 mL | Freq: Four times a day (QID) | Status: DC
  Administered 2024-06-03 – 2024-06-08 (×20): 100 mL

## 2024-06-03 MED ORDER — POTASSIUM CHLORIDE 20 MEQ PO PACK
60.0000 meq | PACK | Freq: Once | ORAL | Status: AC
  Administered 2024-06-03: 60 meq
  Filled 2024-06-03: qty 3

## 2024-06-03 MED ORDER — FOLIC ACID 1 MG PO TABS
1.0000 mg | ORAL_TABLET | Freq: Every day | ORAL | Status: DC
  Administered 2024-06-04 – 2024-06-08 (×5): 1 mg
  Filled 2024-06-03 (×6): qty 1

## 2024-06-03 MED ORDER — ADULT MULTIVITAMIN W/MINERALS CH
1.0000 | ORAL_TABLET | Freq: Every day | ORAL | Status: DC
  Administered 2024-06-04 – 2024-06-08 (×5): 1
  Filled 2024-06-03 (×5): qty 1

## 2024-06-03 MED ORDER — POTASSIUM CHLORIDE 20 MEQ PO PACK
40.0000 meq | PACK | Freq: Once | ORAL | Status: AC
  Administered 2024-06-03: 40 meq
  Filled 2024-06-03: qty 2

## 2024-06-03 MED ORDER — LACTULOSE 10 GM/15ML PO SOLN
10.0000 g | Freq: Three times a day (TID) | ORAL | Status: DC
  Administered 2024-06-03 – 2024-06-05 (×9): 10 g
  Filled 2024-06-03 (×10): qty 15

## 2024-06-03 MED ORDER — MIDODRINE HCL 5 MG PO TABS
10.0000 mg | ORAL_TABLET | Freq: Three times a day (TID) | ORAL | Status: DC
  Administered 2024-06-03 – 2024-06-04 (×3): 10 mg
  Filled 2024-06-03 (×3): qty 2

## 2024-06-03 MED ORDER — VITAL 1.5 CAL PO LIQD
1000.0000 mL | ORAL | Status: DC
  Administered 2024-06-03 – 2024-06-06 (×4): 1000 mL

## 2024-06-03 MED ORDER — MAGNESIUM SULFATE 2 GM/50ML IV SOLN
2.0000 g | Freq: Once | INTRAVENOUS | Status: AC
  Administered 2024-06-03: 2 g via INTRAVENOUS
  Filled 2024-06-03: qty 50

## 2024-06-03 MED ORDER — FUROSEMIDE 10 MG/ML IJ SOLN
120.0000 mg | Freq: Once | INTRAVENOUS | Status: AC
  Administered 2024-06-03: 120 mg via INTRAVENOUS
  Filled 2024-06-03: qty 10

## 2024-06-03 MED ORDER — THIAMINE MONONITRATE 100 MG PO TABS
100.0000 mg | ORAL_TABLET | ORAL | Status: DC
  Administered 2024-06-04 – 2024-06-08 (×5): 100 mg
  Filled 2024-06-03 (×5): qty 1

## 2024-06-03 MED ORDER — OXYCODONE HCL 5 MG PO TABS: 5.0000 mg | ORAL_TABLET | Freq: Four times a day (QID) | ORAL | Status: DC | PRN

## 2024-06-03 NOTE — Progress Notes (Signed)
 NAME:  Omar Turner, MRN:  [id number], DOB:  18-May-1971, LOS: 4 ADMISSION DATE:  05/29/2024, CONSULTATION DATE:  05/30/2024 REFERRING MD:  Benton Shone, MD, CHIEF COMPLAINT:  SOB  History of Present Illness:  53 y/o male with PMH for Alcoholic Cirrhosis, Esophageal Varices s/p clipping, COPD, active drinker (1 pint Vodka daily) and smoker (1ppd), Ascites s/p multiple paracentesis, DT's, Withdrawal Seizures, recent MRI abd showing right pleural effusion.  He presented with SOB today.  He says for the last 3 days he has been very weak, not able to get out of bed much.  He has been having N/V/D.  He cannot hold much down and has not taken his meds in a couple days as a result.  He is more SOB and has a dry cough and has been wheezing.  He has an Albuterol  inhaler which he uses prn.  He also has abd pain throughout.  Apparently he was sitting on his porch and found himself to be light headed, almost passing out and fast heart rate.  EMS found him with HR 180's and gave him a 100J shock.  His HR decreased to the 160's.  He remained hypotensive.  His LA was 7.83.  A paracentesis was done in the ED and he had a paracentesis 1 week ago outpatient with about 2 liters removed.  Pertinent  Medical History  Alcoholic Cirrhosis, Esophageal Varices s/p clipping, COPD, active drinker (1 pint Vodka daily) and smoker (1ppd), Ascites s/p multiple paracentesis, DT's, Withdrawal Seizures, recent MRI abd showing right pleural effusion.    Significant Hospital Events: Including procedures, antibiotic start and stop dates in addition to other pertinent events   8/24: Admit to ICU 8/25: Off pressors, worsening hypoxia requiring intubation 8/26: Back on pressors, central line placed overnight 8/27: Increased FiO2 requirement overnight 8/28: Improving pressor requirement with weaning sedation  Interim History / Subjective:  Improving pressor requirement with weaning sedation, remains intubated and sedated.    Objective    Blood pressure (!) 104/52, pulse 67, temperature 98.4 F (36.9 C), temperature source Oral, resp. rate (!) 28, height 5' 11 (1.803 m), weight 100.9 kg, SpO2 95%.    Vent Mode: PRVC FiO2 (%):  [60 %-80 %] 70 % Set Rate:  [28 bmp] 28 bmp Vt Set:  [450 mL] 450 mL PEEP:  [12 cmH20-14 cmH20] 12 cmH20 Plateau Pressure:  [27 cmH20-28 cmH20] 27 cmH20   Intake/Output Summary (Last 24 hours) at 06/03/2024 1017 Last data filed at 06/03/2024 9180 Gross per 24 hour  Intake 3357.05 ml  Output 6650 ml  Net -3292.95 ml   Filed Weights   05/31/24 0500 06/01/24 0500 06/03/24 0500  Weight: 105 kg 100.8 kg 100.9 kg    Examination: General: chronically ill appearing male, jaundiced, intubated and sedated HENT: [place], AT Lungs: intubated,  Cardiovascular: tachycardic, regular rate and rhythm, normal S1 and S2, no murmurs, rubs or gallops Abdomen: protuberant, soft, normoactive bowel sounds Extremities: warm, pitting edema in ankles, palpable pedal pulses Neuro: intubated and sedated, PERRL GU: foley in place  Resolved problem list   Assessment and Plan   Acute hypoxic respiratory failure, due to ARDS in the setting of aspiration, worsening Possible CAP Increasing oxygen requirements 8/25 requiring intubation. Suspect ARDS in the setting of aspiration. Bedside US  8/25 with small to moderate pleural effusion and small pocket ascites, both unlikely to be contributing to decompensation. CXR improving this morning with IV diuresis yesterday.  - Continue lung protective ventilation  - Wean FiO2 as  able to maintain SpO2>90% - Send tracheal aspirate - Continue zosyn  for aspiration pneumonia and doxycycline  for atypical coverage - Diuresis with 120 mg IV Lasix  with goal negative fluid balance as tolerated - When closer to extubation plan to repeat paracentesis, if more ascites would consider draining to optimize   Decompensated alcoholic cirrhosis with recurrent ascites Likely  decompensated alcoholic hepatitis Portal hypertensive gastropathy History of esophageal varices  Associated hyperammonemia, hyperbilirubinemia, coagulopathy, thrombocytopenia, and elevated LFTs MELD score 30 on 8/25. Current drinker (about 1 pint vodka/day). Diagnostic para in ED did not look consistent with SBP. Followed by [facility] and established with [facility]. Unlikely transplant candidate at this time due to current drinking.  - GI following peripherally - Continue octreotide  and NAC protocol per GI - Diuresis per above - Continue home rifaximin   - Decrease lactulose  with goal 2-3 bowel movements per day - Trend daily CMP, CBC, coags with correction of coagulopathy as clinically indicated - Holding home naldolol, eplerenone  and spironolactone  with shock and pressor requirement - SCDs only with thrombocytopenia  Oliguric AKI, worsening Hyponatremia, stable  Acute renal failure likely multifactorial in the setting of hypoxic respiratory failure, shock and decompensated cirrhosis. Baseline Cr~0.6-0.8.  - Diuresis with 120 mg IV Lasix , aggressive electrolyte repletion while diuresing  - Continue to trend daily BMP - Avoid nephrotoxins, renally dose medications and ensure adequate renal perfusion   Hypotension Current hypotension and vasopressor requirement likely sedation related given levophed  requirement coincided with start of propofol  vs. septic in the setting of aspiration pneumonia/ARDs. - Continue midodrine   - Continue levophed  and vasopressin  to maintain MAP>65 - Antibiotics per above - Continue stress dose steroids, hydrocortisone  100q8h  Hypoalbuminemia - Advance tube feeds to goal   EtOH abuse  - Continue thiamine  and multivitamin   Best Practice (right click and Reselect all SmartList Selections daily)   Diet/type: NPO DVT prophylaxis SCD Pressure ulcer(s): N/A GI prophylaxis: PPI Lines: Central line and Arterial Line, still needed Foley:   yes, and currently needs Code Status:  DNR- see separate IPAL note for conversation  Labs   CBC: Recent Labs  Lab 05/29/24 1826 05/29/24 1835 05/30/24 0433 05/30/24 0530 05/31/24 0459 05/31/24 1535 06/01/24 0518 06/01/24 0608 06/01/24 1352 06/02/24 0343 06/02/24 0509 06/03/24 0320  WBC 9.7  --  13.2* 12.9* 15.6*  --  14.4*  --   --   --   --  8.3  NEUTROABS 7.5  --   --   --   --   --   --   --   --   --   --   --   HGB 13.2   < > 12.1* 12.0* 11.1*   < > 10.3* 11.2* 11.9* 11.2* 11.2* 10.7*  HCT 37.3*   < > 33.5* 33.6* 30.1*   < > 29.0* 33.0* 35.0* 33.0* 33.0* 30.2*  MCV 99.2  --  96.5 97.1 97.1  --  99.7  --   --   --   --  100.3*  PLT 57*  --  57* 50* 46*  --  51*  --   --   --   --  43*   < > = values in this interval not displayed.    Basic Metabolic Panel: Recent Labs  Lab 05/30/24 2026 05/31/24 0459 05/31/24 1302 05/31/24 1535 06/01/24 0518 06/01/24 0608 06/01/24 1500 06/02/24 0343 06/02/24 0509 06/02/24 0615 06/03/24 0320  NA  --  129* 130*   < > 128*   < >  126* 134* 133* 131* 137  K  --  3.6 3.8   < > 4.1   < > 4.1 4.2 4.2 4.0 3.4*  CL  --  94* 92*  --  92*  --  88*  --   --  93* 96*  CO2  --  26 26  --  26  --  26  --   --  27 30  GLUCOSE  --  118* 129*  --  123*  --  172*  --   --  133* 151*  BUN  --  13 12  --  21*  --  25*  --   --  31* 29*  CREATININE  --  1.00 0.79  --  1.18  --  1.09  --   --  1.54* 0.81  CALCIUM   --  8.6* 8.8*  --  8.9  --  8.4*  --   --  8.9 8.8*  MG 2.5* 2.2  --   --   --   --  2.0  --   --  2.2 1.8  PHOS  --   --   --   --   --   --  4.0  --   --  5.5* 3.1   < > = values in this interval not displayed.   GFR: Estimated Creatinine Clearance: 130.5 mL/min (by C-G formula based on SCr of 0.81 mg/dL). Recent Labs  Lab 05/30/24 0530 05/31/24 0459 05/31/24 1302 06/01/24 0517 06/01/24 0518 06/03/24 0320  WBC 12.9* 15.6*  --   --  14.4* 8.3  LATICACIDVEN 6.8* 2.3* 2.6* 1.6  --   --     Liver Function Tests: Recent Labs   Lab 05/31/24 0459 06/01/24 0518 06/02/24 0615 06/02/24 0641 06/03/24 0320  AST 407* 354* 293* 299* 249*  ALT 140* 135* 136* 139* 131*  ALKPHOS 124 90 92 101 100  BILITOT 25.7* 28.9* 27.4* 28.5* 25.6*  PROT 7.0 6.9 6.7 6.6 6.4*  ALBUMIN  2.6* 3.3* 3.0* 3.0* 2.6*   Recent Labs  Lab 05/29/24 1826  LIPASE 57*   Recent Labs  Lab 05/29/24 1827 05/30/24 0530 05/31/24 0459 06/02/24 1122  AMMONIA 29 53* 59* 59*    ABG    Component Value Date/Time   PHART 7.293 (L) 06/02/2024 0509   PCO2ART 63.1 (H) 06/02/2024 0509   PO2ART 103 06/02/2024 0509   HCO3 30.6 (H) 06/02/2024 0509   TCO2 33 (H) 06/02/2024 0509   ACIDBASEDEF 1.0 05/29/2024 1835   O2SAT 97 06/02/2024 0509     Coagulation Profile: Recent Labs  Lab 05/30/24 0530 05/31/24 0459 06/01/24 0518 06/02/24 0630 06/03/24 0320  INR 2.2* 2.7* 2.7* 2.3* 2.4*    Cardiac Enzymes: No results for input(s): CKTOTAL, CKMB, CKMBINDEX, TROPONINI in the last 168 hours.  HbA1C: Hgb A1c MFr Bld  Date/Time Value Ref Range Status  05/30/2024 04:33 AM 3.8 (L) 4.8 - 5.6 % Final    Comment:    (NOTE) Diagnosis of Diabetes The following HbA1c ranges recommended by the American Diabetes Association (ADA) may be used as an aid in the diagnosis of diabetes mellitus.  Hemoglobin             Suggested A1C NGSP%              Diagnosis  <5.7                   Non Diabetic  5.7-6.4  Pre-Diabetic  >6.4                   Diabetic  <7.0                   Glycemic control for                       adults with diabetes.      CBG: Recent Labs  Lab 06/02/24 1517 06/02/24 1945 06/03/24 0021 06/03/24 0306 06/03/24 0725  GLUCAP 151* 166* 151* 161* 137*    CRITICAL CARE Performed by: Rexene LOISE Blush  The patient is critically ill with multiple organ system failure and requires high complexity decision making for assessment and support, frequent evaluation and titration of therapies, advanced monitoring,  review of radiographic studies and interpretation of complex data.   Critical Care Time devoted to patient care services, exclusive of separately billable procedures, described in this note is 31 minutes.  Rexene LOISE Blush, PA-C [facility] 06/03/24 10:17 AM  Please see [email or web address] for pager details.  From 7A-7P if no response, please call [phone] After hours, please call ELink [phone]

## 2024-06-03 NOTE — IPAL (Signed)
  Interdisciplinary Goals of Care Family Meeting   Date carried out: 06/03/2024  Location of the meeting: Bedside  Member's involved: Bedside Registered Nurse, Family Member or next of kin, and Other: Physician Assistant  Durable Power of Attorney or acting medical decision maker: Sandy Nuno-Tickle    Discussion: We discussed goals of care for [employer]. Bedside nurse notified myself and attending, Dr. Annella, that patient's mother would like to change code status to DNR. I went to bedside and Particia told me that she recognizes how sick her son is and the severity of his liver disease. She believes at this time he would not want aggressive measures in the event his heart were to stop. We discussed continuing current support. Code status changed in the chart to reflect her wishes.   Code status:   Code Status: Limited: Do not attempt resuscitation (DNR) -DNR-LIMITED -Do Not Intubate/DNI    Disposition: Continue current acute care  Time spent for the meeting: 8 minutes    Rexene LOISE Blush, PA-C  06/03/2024, 10:13 AM

## 2024-06-03 NOTE — Progress Notes (Signed)
 [facility] ADULT ICU REPLACEMENT PROTOCOL   The patient does apply for the [facility] Adult ICU Electrolyte Replacment Protocol based on the criteria listed below:   1.Exclusion criteria: TCTS, ECMO, Dialysis, and Myasthenia Gravis patients 2. Is GFR >/= 30 ml/min? Yes.    Patient's GFR today is >60 3. Is SCr </= 2? Yes.   Patient's SCr is 0.81 mg/dL 4. Did SCr increase >/= 0.5 in 24 hours? No. 5.Pt's weight >40kg  Yes.   6. Abnormal electrolyte(s):   K 3.4, Mg 1.8  7. Electrolytes replaced per protocol 8.  Call MD STAT for K+ </= 2.5, Phos </= 1, or Mag </= 1 Physician:  CLEMENTEEN Epimenio Blackbird R Victorine Mcnee 06/03/2024 5:25 AM

## 2024-06-03 NOTE — Progress Notes (Signed)
   06/03/24 0728  Adult Ventilator Settings  Vent Type Servo i  Humidity HME  Vent Mode PRVC  Vt Set 450 mL  Set Rate 28 bmp  FiO2 (%) (S)  70 % (Found on 100%)  I Time 0.85 Sec(s) (Found on 1.0)  PEEP (S)  12 cmH20 (Found on 14)

## 2024-06-04 ENCOUNTER — Inpatient Hospital Stay (HOSPITAL_COMMUNITY)

## 2024-06-04 DIAGNOSIS — R739 Hyperglycemia, unspecified: Secondary | ICD-10-CM

## 2024-06-04 DIAGNOSIS — K7290 Hepatic failure, unspecified without coma: Secondary | ICD-10-CM | POA: Diagnosis not present

## 2024-06-04 DIAGNOSIS — K7460 Unspecified cirrhosis of liver: Secondary | ICD-10-CM | POA: Diagnosis not present

## 2024-06-04 DIAGNOSIS — N179 Acute kidney failure, unspecified: Secondary | ICD-10-CM | POA: Diagnosis not present

## 2024-06-04 DIAGNOSIS — J9601 Acute respiratory failure with hypoxia: Secondary | ICD-10-CM | POA: Diagnosis not present

## 2024-06-04 DIAGNOSIS — R579 Shock, unspecified: Secondary | ICD-10-CM

## 2024-06-04 DIAGNOSIS — J80 Acute respiratory distress syndrome: Secondary | ICD-10-CM

## 2024-06-04 LAB — CBC
HCT: 31.1 % — ABNORMAL LOW (ref 39.0–52.0)
Hemoglobin: 10.9 g/dL — ABNORMAL LOW (ref 13.0–17.0)
MCH: 35.5 pg — ABNORMAL HIGH (ref 26.0–34.0)
MCHC: 35 g/dL (ref 30.0–36.0)
MCV: 101.3 fL — ABNORMAL HIGH (ref 80.0–100.0)
Platelets: 49 K/uL — ABNORMAL LOW (ref 150–400)
RBC: 3.07 MIL/uL — ABNORMAL LOW (ref 4.22–5.81)
RDW: 17.6 % — ABNORMAL HIGH (ref 11.5–15.5)
WBC: 8.2 K/uL (ref 4.0–10.5)
nRBC: 0 % (ref 0.0–0.2)

## 2024-06-04 LAB — POCT I-STAT 7, (LYTES, BLD GAS, ICA,H+H)
Acid-Base Excess: 11 mmol/L — ABNORMAL HIGH (ref 0.0–2.0)
Bicarbonate: 37.4 mmol/L — ABNORMAL HIGH (ref 20.0–28.0)
Calcium, Ion: 1.18 mmol/L (ref 1.15–1.40)
HCT: 31 % — ABNORMAL LOW (ref 39.0–52.0)
Hemoglobin: 10.5 g/dL — ABNORMAL LOW (ref 13.0–17.0)
O2 Saturation: 95 %
Potassium: 3.3 mmol/L — ABNORMAL LOW (ref 3.5–5.1)
Sodium: 146 mmol/L — ABNORMAL HIGH (ref 135–145)
TCO2: 39 mmol/L — ABNORMAL HIGH (ref 22–32)
pCO2 arterial: 55.3 mmHg — ABNORMAL HIGH (ref 32–48)
pH, Arterial: 7.437 (ref 7.35–7.45)
pO2, Arterial: 76 mmHg — ABNORMAL LOW (ref 83–108)

## 2024-06-04 LAB — COMPREHENSIVE METABOLIC PANEL WITH GFR
ALT: 133 U/L — ABNORMAL HIGH (ref 0–44)
AST: 233 U/L — ABNORMAL HIGH (ref 15–41)
Albumin: 2.5 g/dL — ABNORMAL LOW (ref 3.5–5.0)
Alkaline Phosphatase: 100 U/L (ref 38–126)
Anion gap: 12 (ref 5–15)
BUN: 35 mg/dL — ABNORMAL HIGH (ref 6–20)
CO2: 32 mmol/L (ref 22–32)
Calcium: 8.9 mg/dL (ref 8.9–10.3)
Chloride: 97 mmol/L — ABNORMAL LOW (ref 98–111)
Creatinine, Ser: 0.79 mg/dL (ref 0.61–1.24)
GFR, Estimated: >60 mL/min (ref >60)
Glucose, Bld: 161 mg/dL — ABNORMAL HIGH (ref 70–99)
Potassium: 3.7 mmol/L (ref 3.5–5.1)
Sodium: 141 mmol/L (ref 135–145)
Total Bilirubin: 23.8 mg/dL (ref 0.0–1.2)
Total Protein: 6.5 g/dL (ref 6.5–8.1)

## 2024-06-04 LAB — CULTURE, BLOOD (ROUTINE X 2)
Culture: NO GROWTH
Culture: NO GROWTH
Special Requests: ADEQUATE

## 2024-06-04 LAB — GLUCOSE, CAPILLARY
Glucose-Capillary: 171 mg/dL — ABNORMAL HIGH (ref 70–99)
Glucose-Capillary: 165 mg/dL — ABNORMAL HIGH (ref 70–99)
Glucose-Capillary: 123 mg/dL — ABNORMAL HIGH (ref 70–99)
Glucose-Capillary: 133 mg/dL — ABNORMAL HIGH (ref 70–99)
Glucose-Capillary: 141 mg/dL — ABNORMAL HIGH (ref 70–99)
Glucose-Capillary: 130 mg/dL — ABNORMAL HIGH (ref 70–99)

## 2024-06-04 LAB — PHOSPHORUS: Phosphorus: 3.8 mg/dL (ref 2.5–4.6)

## 2024-06-04 LAB — MAGNESIUM: Magnesium: 2.1 mg/dL (ref 1.7–2.4)

## 2024-06-04 LAB — PROTIME-INR
INR: 2.2 — ABNORMAL HIGH (ref 0.8–1.2)
Prothrombin Time: 25.2 s — ABNORMAL HIGH (ref 11.4–15.2)

## 2024-06-04 LAB — TRIGLYCERIDES: Triglycerides: 91 mg/dL (ref <150)

## 2024-06-04 MED ORDER — FUROSEMIDE 10 MG/ML IJ SOLN
120.0000 mg | Freq: Once | INTRAVENOUS | Status: AC
  Administered 2024-06-04: 120 mg via INTRAVENOUS
  Filled 2024-06-04: qty 2

## 2024-06-04 MED ORDER — GUAIFENESIN 100 MG/5ML PO LIQD
15.0000 mL | Freq: Four times a day (QID) | ORAL | Status: DC
  Administered 2024-06-04 – 2024-06-08 (×17): 15 mL
  Filled 2024-06-04 (×18): qty 15

## 2024-06-04 MED ORDER — POTASSIUM CHLORIDE 20 MEQ PO PACK
40.0000 meq | PACK | Freq: Two times a day (BID) | ORAL | Status: AC
  Administered 2024-06-04 (×2): 40 meq
  Filled 2024-06-04 (×2): qty 2

## 2024-06-04 MED ORDER — IPRATROPIUM-ALBUTEROL 0.5-2.5 (3) MG/3ML IN SOLN: 3.0000 mL | RESPIRATORY_TRACT | Status: DC | PRN

## 2024-06-04 MED ORDER — MIDODRINE HCL 5 MG PO TABS
15.0000 mg | ORAL_TABLET | Freq: Three times a day (TID) | ORAL | Status: DC
  Administered 2024-06-04 (×2): 15 mg
  Filled 2024-06-04 (×3): qty 3

## 2024-06-04 MED ORDER — OXYCODONE HCL 5 MG PO TABS
5.0000 mg | ORAL_TABLET | Freq: Four times a day (QID) | ORAL | Status: DC
  Administered 2024-06-04 – 2024-06-06 (×8): 5 mg
  Filled 2024-06-04 (×9): qty 1

## 2024-06-04 MED ORDER — ALBUMIN HUMAN 25 % IV SOLN
25.0000 g | Freq: Once | INTRAVENOUS | Status: AC
  Administered 2024-06-04: 25 g via INTRAVENOUS
  Filled 2024-06-04: qty 100

## 2024-06-04 NOTE — Progress Notes (Signed)
 Nutrition Follow-up  DOCUMENTATION CODES:  Not applicable  INTERVENTION:  Continue TF via OGT: Vital 1.5 at 47ml/hr ( per day) 60ml ProSource TF20 once daily Provides 2060kcal, 109g protein and free water  daily  Free water  flushes 100ml q6h ( total) Total free water  (TF + FWF)-  NUTRITION DIAGNOSIS:  Inadequate oral intake related to acute illness as evidenced by NPO status. - remains applicable  GOAL:  Patient will meet greater than or equal to 90% of their needs - goal met via TF  MONITOR:  Vent status, Labs, Weight trends, TF tolerance, Skin, I & O's  REASON FOR ASSESSMENT:  Ventilator, Consult Enteral/tube feeding initiation and management  ASSESSMENT:  Pt admitted with shortness of breath and increasing weakness, n/v/d. PMH significant for alcoholic cirrhosis, esophageal varices s/p clipping, COPD, current EtOH use (1 pint vodka daily) and smoker (1ppd), ascites s/p multiple paracentesis, withdrawal seizures.  8/24: admitted to ICU  8/25: worsening hypoxia; intubated  Patient remains intubated on ventilator support MV: 12.3 L/min Temp (24hrs), Avg:97.9 F (36.6 C), Min:97.7 F (36.5 C), Max:98.4 F (36.9 C) MAP (a-line): range from 62-5mmHg x12 hours  Acute hypoxemic respiratory failure d/t ARDS following aspiration. Decompensation suspected r/t aspiration event versus worsening pulmonary edema.   Per interdisciplinary goals of care discussion yesterday, pt changed to DNR however continues with current support.   Checked in with patient at bedside. Pt's mom present.  RN also present. He is tolerating tube feeding advancement well.  RN increased tube feeding to goal rate from 8ml/hr at that time.   Continues on lactulose . Having bowel function.  Abdomen remains distended and taut.   Drains/lines: Left internal jugular triple lumen FMS A line OGT  UOP: x24 hours  Admit weight: 102.5 kg Current weight: 102.6 kg  (+non-pitting generalized, mild pitting BLE edema)  Medications: folvite , solu-cortef , SSI 0-9 units q4h, lactulose  10g TID, MVI, thiamine  Drips: Levo @ 2 mcg/min Vaso @ 0.03 units/min  Labs:  BUN 35 AST 233 ALT 133 Total bilirubin 23.8 CBG's 137-174 x24 hours  Diet Order:   Diet Order             Diet NPO time specified  Diet effective now                   EDUCATION NEEDS:   No education needs have been identified at this time  Skin:  Skin Assessment: Reviewed RN Assessment (jaundice)  Last BM:  x24 hours via FMS  Height:   Ht Readings from Last 1 Encounters:  05/30/24 5' 11 (1.803 m)    Weight:   Wt Readings from Last 1 Encounters:  06/04/24 102.6 kg    Ideal Body Weight:  78.2 kg  BMI:  Body mass index is 31.55 kg/m.  Estimated Nutritional Needs:   Kcal:  1900-2100  Protein:  100-115g  Fluid:  1.8L  Royce Maris, RDN, LDN Clinical Nutrition See AMiON for contact information.

## 2024-06-04 NOTE — Progress Notes (Signed)
 NAME:  Omar Turner, MRN:  [id number], DOB:  May 16, 1971, LOS: 5 ADMISSION DATE:  05/29/2024, CONSULTATION DATE:  05/30/2024 REFERRING MD:  Benton Shone, MD, CHIEF COMPLAINT:  SOB  History of Present Illness:  53 y/o male with PMH for Alcoholic Cirrhosis, Esophageal Varices s/p clipping, COPD, active drinker (1 pint Vodka daily) and smoker (1ppd), Ascites s/p multiple paracentesis, DT's, Withdrawal Seizures, recent MRI abd showing right pleural effusion.  He presented with SOB today.  He says for the last 3 days he has been very weak, not able to get out of bed much.  He has been having N/V/D.  He cannot hold much down and has not taken his meds in a couple days as a result.  He is more SOB and has a dry cough and has been wheezing.  He has an Albuterol  inhaler which he uses prn.  He also has abd pain throughout.  Apparently he was sitting on his porch and found himself to be light headed, almost passing out and fast heart rate.  EMS found him with HR 180's and gave him a 100J shock.  His HR decreased to the 160's.  He remained hypotensive.  His LA was 7.83.  A paracentesis was done in the ED and he had a paracentesis 1 week ago outpatient with about 2 liters removed.  Pertinent  Medical History  Alcoholic Cirrhosis, Esophageal Varices s/p clipping, COPD, active drinker (1 pint Vodka daily) and smoker (1ppd), Ascites s/p multiple paracentesis, DT's, Withdrawal Seizures, recent MRI abd showing right pleural effusion.    Significant Hospital Events: Including procedures, antibiotic start and stop dates in addition to other pertinent events   8/24: Admit to ICU 8/25: Off pressors, worsening hypoxia requiring intubation 8/26: Back on pressors, central line placed overnight 8/27: Increased FiO2 requirement overnight 8/28: Improving pressor requirement with weaning sedation, tolerating diureses, decreasing FiO2/ PEEP, mother changed to DNR  Interim History / Subjective:  Down trending vasopressor  support, NE 3, vaso 0.03 FiO2 0.6, peep 10 Intermittently following commands  Objective    Blood pressure (!) 104/52, pulse 84, temperature 97.9 F (36.6 C), temperature source Axillary, resp. rate (!) 28, height 5' 11 (1.803 m), weight 102.6 kg, SpO2 100%.    Vent Mode: PRVC FiO2 (%):  [60 %-70 %] 70 % Set Rate:  [28 bmp] 28 bmp Vt Set:  [450 mL] 450 mL PEEP:  [8 cmH20-10 cmH20] 10 cmH20 Plateau Pressure:  [18 cmH20] 18 cmH20   Intake/Output Summary (Last 24 hours) at 06/04/2024 1144 Last data filed at 06/04/2024 1125 Gross per 24 hour  Intake 4262.99 ml  Output 4850 ml  Net -587.01 ml   Filed Weights   06/01/24 0500 06/03/24 0500 06/04/24 0500  Weight: 100.8 kg 100.9 kg 102.6 kg    Examination: Fent 300 General:  AoC ill appearing male on MV in NAD HEENT: MM pink/moist, ETT/ OGT, pupils 3/r, icterus  Neuro: will open eyes intermittently and wiggle toes on command, MAE spont CV: rr, NSR, no murmur PULM:  MV supported, clear anteriorly, no wheeze, plat 23, dp 13, FiO2 0.6, peep 10, scant secretions GI: protuberant, soft, +bs, foley, FMS Extremities: warm/dry, anasarca   Skin: jaundiced   Afebrile UOP 2.9L -296 ml Net +4.8L  Labs> K 3.7, Na 141, BUN/ sCr 35/ 0.79, Mag 2.1,    Resolved problem list   Assessment and Plan   Acute hypoxic respiratory failure, 2/2 ARDS in the setting of aspiration, LLL PNA Increasing oxygen requirements 8/25  requiring intubation. Suspect ARDS in the setting of aspiration +/- volume overload. . Bedside US  8/25 with small to moderate pleural effusion and small pocket ascites, both unlikely to be contributing to decompensation.  - CXR today with decreased aeration to LLL/ retrocardiac, persistent moderate right pleural effusion. - cont full LTVV support, 6cc/kg IBW with goal Pplat <30 and DP<15  - VAP prevention protocol/ PPI - PAD protocol for sedation> remains off propofol , fentanyl  gtt, adding scheduled oxy to minimize fentanyl  with  scheduled bowel regimen - intermittent CXR/ ABG - cont diuresis as renally/ hemodynamically tolerated  - wean PEEP/ FiO2 as able for SpO2 >92% - aggressive pulmonary hygiene - cont zosyn / doxycycline  - follow trach asp - prn BD  Decompensated alcoholic cirrhosis with recurrent ascites Portal hypertensive gastropathy History of esophageal varices  Associated hyperammonemia, hyperbilirubinemia, coagulopathy, thrombocytopenia, and elevated LFTs MELD score 30 on 8/25. Current drinker (about 1 pint vodka/day). Diagnostic para in ED did not look consistent with SBP. Followed by [facility] and established with [facility]. Unlikely transplant candidate at this time due to current drinking.  - GI following peripherally - likely stopping NAC today, s/p 3 days - Diuresis per above - rifaximin   - cont lactulose  TID> titrate to BMs - Trend daily CMP, CBC, coags with correction of coagulopathy as clinically indicated - Holding home naldolol, eplerenone  and spironolactone  with shock and pressor requirement - SCDs only with thrombocytopenia - overall poor prognosis.  DNR/ DNI 8/28.    AKI, improving Hyponatremia, stable  Hypokalemia Acute renal failure likely multifactorial in the setting of hypoxic respiratory failure, shock and decompensated cirrhosis. Baseline Cr~0.6-0.8.  - cont lasix  again today, good UOP, improving sCr, improving pressor requirements - optimize electrolytes/ replete prn - cont foley - trend renal indices  - strict I/Os, daily wts - avoid nephrotoxins, renal dose meds, hemodynamic support as above  Shock- 2/2 sepsis/ sedation - cont weaning NE/ vaso for MAP goal > 65 - zosyn / doxy - follow cultures - increase midodrine  to 15mg  TID - cont stress dose steroids - trend WBC/ fever curve   Hypoalbuminemia - tolerating TF at goal   EtOH abuse  - thiamine  and multivitamin  - cessation counseling as appropriate   Hyperglycemia - CBG q4, prn  SSI  Best Practice (right click and Reselect all SmartList Selections daily)   Diet/type: NPO; EN per RD DVT prophylaxis SCD only with thrombocytopenia Pressure ulcer(s): N/A GI prophylaxis: PPI Lines: Central line and Arterial Line, still needed Foley:  yes, and currently needs Code Status:  DNR/ DNI  Mother and sister updated at bedside 8/29  Labs   CBC: Recent Labs  Lab 05/29/24 1826 05/29/24 1835 05/30/24 0530 05/31/24 0459 05/31/24 1535 06/01/24 0518 06/01/24 0608 06/01/24 1352 06/02/24 0343 06/02/24 0509 06/03/24 0320 06/04/24 0445  WBC 9.7   < > 12.9* 15.6*  --  14.4*  --   --   --   --  8.3 8.2  NEUTROABS 7.5  --   --   --   --   --   --   --   --   --   --   --   HGB 13.2   < > 12.0* 11.1*   < > 10.3*   < > 11.9* 11.2* 11.2* 10.7* 10.9*  HCT 37.3*   < > 33.6* 30.1*   < > 29.0*   < > 35.0* 33.0* 33.0* 30.2* 31.1*  MCV 99.2   < > 97.1 97.1  --  99.7  --   --   --   --  100.3* 101.3*  PLT 57*   < > 50* 46*  --  51*  --   --   --   --  43* 49*   < > = values in this interval not displayed.    Basic Metabolic Panel: Recent Labs  Lab 05/31/24 0459 05/31/24 1302 06/01/24 0518 06/01/24 9391 06/01/24 1500 06/02/24 0343 06/02/24 0509 06/02/24 0615 06/03/24 0320 06/04/24 0445  NA 129*   < > 128*   < > 126* 134* 133* 131* 137 141  K 3.6   < > 4.1   < > 4.1 4.2 4.2 4.0 3.4* 3.7  CL 94*   < > 92*  --  88*  --   --  93* 96* 97*  CO2 26   < > 26  --  26  --   --  27 30 32  GLUCOSE 118*   < > 123*  --  172*  --   --  133* 151* 161*  BUN 13   < > 21*  --  25*  --   --  31* 29* 35*  CREATININE 1.00   < > 1.18  --  1.09  --   --  1.54* 0.81 0.79  CALCIUM  8.6*   < > 8.9  --  8.4*  --   --  8.9 8.8* 8.9  MG 2.2  --   --   --  2.0  --   --  2.2 1.8 2.1  PHOS  --   --   --   --  4.0  --   --  5.5* 3.1 3.8   < > = values in this interval not displayed.   GFR: Estimated Creatinine Clearance: 133.2 mL/min (by C-G formula based on SCr of 0.79 mg/dL). Recent Labs  Lab  05/30/24 0530 05/31/24 0459 05/31/24 1302 06/01/24 0517 06/01/24 0518 06/03/24 0320 06/04/24 0445  WBC 12.9* 15.6*  --   --  14.4* 8.3 8.2  LATICACIDVEN 6.8* 2.3* 2.6* 1.6  --   --   --     Liver Function Tests: Recent Labs  Lab 06/01/24 0518 06/02/24 0615 06/02/24 0641 06/03/24 0320 06/04/24 0445  AST 354* 293* 299* 249* 233*  ALT 135* 136* 139* 131* 133*  ALKPHOS 90 92 101 100 100  BILITOT 28.9* 27.4* 28.5* 25.6* 23.8*  PROT 6.9 6.7 6.6 6.4* 6.5  ALBUMIN  3.3* 3.0* 3.0* 2.6* 2.5*   Recent Labs  Lab 05/29/24 1826  LIPASE 57*   Recent Labs  Lab 05/29/24 1827 05/30/24 0530 05/31/24 0459 06/02/24 1122  AMMONIA 29 53* 59* 59*    ABG    Component Value Date/Time   PHART 7.293 (L) 06/02/2024 0509   PCO2ART 63.1 (H) 06/02/2024 0509   PO2ART 103 06/02/2024 0509   HCO3 30.6 (H) 06/02/2024 0509   TCO2 33 (H) 06/02/2024 0509   ACIDBASEDEF 1.0 05/29/2024 1835   O2SAT 97 06/02/2024 0509     Coagulation Profile: Recent Labs  Lab 05/31/24 0459 06/01/24 0518 06/02/24 0630 06/03/24 0320 06/04/24 0445  INR 2.7* 2.7* 2.3* 2.4* 2.2*    Cardiac Enzymes: No results for input(s): CKTOTAL, CKMB, CKMBINDEX, TROPONINI in the last 168 hours.  HbA1C: Hgb A1c MFr Bld  Date/Time Value Ref Range Status  05/30/2024 04:33 AM 3.8 (L) 4.8 - 5.6 % Final    Comment:    (NOTE) Diagnosis of Diabetes The following HbA1c ranges recommended by  the American Diabetes Association (ADA) may be used as an aid in the diagnosis of diabetes mellitus.  Hemoglobin             Suggested A1C NGSP%              Diagnosis  <5.7                   Non Diabetic  5.7-6.4                Pre-Diabetic  >6.4                   Diabetic  <7.0                   Glycemic control for                       adults with diabetes.      CBG: Recent Labs  Lab 06/03/24 2002 06/03/24 2338 06/04/24 0346 06/04/24 0739 06/04/24 1121  GLUCAP 174* 137* 171* 165* 123*    CRITICAL  CARE Performed by: Lyle Pesa  The patient is critically ill with multiple organ system failure and requires high complexity decision making for assessment and support, frequent evaluation and titration of therapies, advanced monitoring, review of radiographic studies and interpretation of complex data.   CCT 38 mins     Lyle Pesa, MSN, AG-ACNP-BC [facility] 06/04/2024, 11:44 AM  See Amion for pager If no response to pager , please call [phone] until 7pm After 7:00 pm call Elink  [phone]

## 2024-06-04 NOTE — Progress Notes (Signed)
   06/04/24 1530  Airway Suctioning/Secretions  Suction Type ETT  Suction Device  Catheter  Secretion Amount Moderate  Secretion Color Tan  Secretion Consistency (S)  Mucous plugs (DRIED)  Suction Tolerance Tolerated fairly well  Suctioning Adverse Effects Anxiety   CCMD Byrum at bedside.

## 2024-06-04 NOTE — Plan of Care (Signed)
 Pt FC LUE, BLE. Non purposeful RUE, +cough -gag. Sclera yellow, pupils 3mm reactive. Slowly titration fentanyl  down, oxy added. Vaso off, titrating norepi to goal map. Lasix  given UOP: . FMS: . Bathed. Problem: Education: Goal: Ability to describe self-care measures that may prevent or decrease complications (Diabetes Survival Skills Education) will improve Outcome: Progressing Goal: Individualized Educational Video(s) Outcome: Progressing   Problem: Coping: Goal: Ability to adjust to condition or change in health will improve Outcome: Progressing   Problem: Fluid Volume: Goal: Ability to maintain a balanced intake and output will improve Outcome: Progressing   Problem: Health Behavior/Discharge Planning: Goal: Ability to identify and utilize available resources and services will improve Outcome: Progressing Goal: Ability to manage health-related needs will improve Outcome: Progressing   Problem: Metabolic: Goal: Ability to maintain appropriate glucose levels will improve Outcome: Progressing   Problem: Nutritional: Goal: Maintenance of adequate nutrition will improve Outcome: Progressing Goal: Progress toward achieving an optimal weight will improve Outcome: Progressing   Problem: Skin Integrity: Goal: Risk for impaired skin integrity will decrease Outcome: Progressing   Problem: Tissue Perfusion: Goal: Adequacy of tissue perfusion will improve Outcome: Progressing   Problem: Education: Goal: Knowledge of General Education information will improve Description: Including pain rating scale, medication(s)/side effects and non-pharmacologic comfort measures Outcome: Progressing   Problem: Health Behavior/Discharge Planning: Goal: Ability to manage health-related needs will improve Outcome: Progressing   Problem: Clinical Measurements: Goal: Ability to maintain clinical measurements within normal limits will improve Outcome: Progressing Goal: Will remain free  from infection Outcome: Progressing Goal: Diagnostic test results will improve Outcome: Progressing Goal: Respiratory complications will improve Outcome: Progressing Goal: Cardiovascular complication will be avoided Outcome: Progressing   Problem: Activity: Goal: Risk for activity intolerance will decrease Outcome: Progressing   Problem: Nutrition: Goal: Adequate nutrition will be maintained Outcome: Progressing   Problem: Coping: Goal: Level of anxiety will decrease Outcome: Progressing   Problem: Elimination: Goal: Will not experience complications related to bowel motility Outcome: Progressing Goal: Will not experience complications related to urinary retention Outcome: Progressing   Problem: Pain Managment: Goal: General experience of comfort will improve and/or be controlled Outcome: Progressing   Problem: Safety: Goal: Ability to remain free from injury will improve Outcome: Progressing   Problem: Skin Integrity: Goal: Risk for impaired skin integrity will decrease Outcome: Progressing   Problem: Activity: Goal: Ability to tolerate increased activity will improve Outcome: Progressing   Problem: Respiratory: Goal: Ability to maintain a clear airway and adequate ventilation will improve Outcome: Progressing   Problem: Role Relationship: Goal: Method of communication will improve Outcome: Progressing

## 2024-06-05 ENCOUNTER — Other Ambulatory Visit: Payer: Self-pay

## 2024-06-05 DIAGNOSIS — J80 Acute respiratory distress syndrome: Secondary | ICD-10-CM | POA: Diagnosis not present

## 2024-06-05 DIAGNOSIS — R579 Shock, unspecified: Secondary | ICD-10-CM | POA: Diagnosis not present

## 2024-06-05 DIAGNOSIS — N179 Acute kidney failure, unspecified: Secondary | ICD-10-CM | POA: Diagnosis not present

## 2024-06-05 DIAGNOSIS — K7031 Alcoholic cirrhosis of liver with ascites: Secondary | ICD-10-CM | POA: Diagnosis not present

## 2024-06-05 LAB — COMPREHENSIVE METABOLIC PANEL WITH GFR
ALT: 138 U/L — ABNORMAL HIGH (ref 0–44)
AST: 254 U/L — ABNORMAL HIGH (ref 15–41)
Albumin: 2.6 g/dL — ABNORMAL LOW (ref 3.5–5.0)
Alkaline Phosphatase: 114 U/L (ref 38–126)
Anion gap: 12 (ref 5–15)
BUN: 45 mg/dL — ABNORMAL HIGH (ref 6–20)
CO2: 32 mmol/L (ref 22–32)
Calcium: 8.8 mg/dL — ABNORMAL LOW (ref 8.9–10.3)
Chloride: 101 mmol/L (ref 98–111)
Creatinine, Ser: 0.92 mg/dL (ref 0.61–1.24)
GFR, Estimated: >60 mL/min (ref >60)
Glucose, Bld: 137 mg/dL — ABNORMAL HIGH (ref 70–99)
Potassium: 4.1 mmol/L (ref 3.5–5.1)
Sodium: 145 mmol/L (ref 135–145)
Total Bilirubin: 23.9 mg/dL (ref 0.0–1.2)
Total Protein: 6.3 g/dL — ABNORMAL LOW (ref 6.5–8.1)

## 2024-06-05 LAB — GLUCOSE, CAPILLARY
Glucose-Capillary: 133 mg/dL — ABNORMAL HIGH (ref 70–99)
Glucose-Capillary: 152 mg/dL — ABNORMAL HIGH (ref 70–99)
Glucose-Capillary: 166 mg/dL — ABNORMAL HIGH (ref 70–99)
Glucose-Capillary: 141 mg/dL — ABNORMAL HIGH (ref 70–99)
Glucose-Capillary: 155 mg/dL — ABNORMAL HIGH (ref 70–99)

## 2024-06-05 LAB — CBC
HCT: 31.7 % — ABNORMAL LOW (ref 39.0–52.0)
Hemoglobin: 11 g/dL — ABNORMAL LOW (ref 13.0–17.0)
MCH: 35.9 pg — ABNORMAL HIGH (ref 26.0–34.0)
MCHC: 34.7 g/dL (ref 30.0–36.0)
MCV: 103.6 fL — ABNORMAL HIGH (ref 80.0–100.0)
Platelets: 51 K/uL — ABNORMAL LOW (ref 150–400)
RBC: 3.06 MIL/uL — ABNORMAL LOW (ref 4.22–5.81)
RDW: 18.8 % — ABNORMAL HIGH (ref 11.5–15.5)
WBC: 10.1 K/uL (ref 4.0–10.5)
nRBC: 0 % (ref 0.0–0.2)

## 2024-06-05 LAB — MAGNESIUM: Magnesium: 2.3 mg/dL (ref 1.7–2.4)

## 2024-06-05 MED ORDER — DEXMEDETOMIDINE HCL IN NACL 400 MCG/100ML IV SOLN
0.0000 ug/kg/h | INTRAVENOUS | Status: DC
  Administered 2024-06-05 (×2): 0.4 ug/kg/h via INTRAVENOUS
  Administered 2024-06-06: 1.2 ug/kg/h via INTRAVENOUS
  Administered 2024-06-06 (×2): 0.7 ug/kg/h via INTRAVENOUS
  Administered 2024-06-06 (×3): 1.2 ug/kg/h via INTRAVENOUS
  Administered 2024-06-07: 1 ug/kg/h via INTRAVENOUS
  Administered 2024-06-07: 0.2 ug/kg/h via INTRAVENOUS
  Administered 2024-06-07 (×2): 1.2 ug/kg/h via INTRAVENOUS
  Administered 2024-06-08: 0.6 ug/kg/h via INTRAVENOUS
  Filled 2024-06-05 (×13): qty 100

## 2024-06-05 MED ORDER — FUROSEMIDE 10 MG/ML IJ SOLN
120.0000 mg | Freq: Once | INTRAVENOUS | Status: AC
  Administered 2024-06-05: 120 mg via INTRAVENOUS
  Filled 2024-06-05: qty 10

## 2024-06-05 MED ORDER — IBUPROFEN 100 MG/5ML PO SUSP
400.0000 mg | Freq: Three times a day (TID) | ORAL | Status: DC | PRN
  Administered 2024-06-05 (×2): 400 mg
  Filled 2024-06-05 (×2): qty 20

## 2024-06-05 MED ORDER — PIPERACILLIN-TAZOBACTAM 3.375 G IVPB
3.3750 g | Freq: Three times a day (TID) | INTRAVENOUS | Status: AC
  Administered 2024-06-05 (×2): 3.375 g via INTRAVENOUS
  Filled 2024-06-05 (×2): qty 50

## 2024-06-05 MED ORDER — MIDODRINE HCL 5 MG PO TABS
15.0000 mg | ORAL_TABLET | Freq: Three times a day (TID) | ORAL | Status: DC
  Administered 2024-06-05 – 2024-06-08 (×10): 15 mg
  Filled 2024-06-05 (×9): qty 3

## 2024-06-05 NOTE — Plan of Care (Signed)
  Problem: Education: Goal: Knowledge of General Education information will improve Description: Including pain rating scale, medication(s)/side effects and non-pharmacologic comfort measures Outcome: Not Progressing   Problem: Clinical Measurements: Goal: Ability to maintain clinical measurements within normal limits will improve Outcome: Not Progressing   Problem: Elimination: Goal: Will not experience complications related to bowel motility Outcome: Not Progressing Goal: Will not experience complications related to urinary retention Outcome: Not Progressing   Problem: Respiratory: Goal: Ability to maintain a clear airway and adequate ventilation will improve Outcome: Not Progressing

## 2024-06-05 NOTE — Progress Notes (Signed)
 NAME:  Omar Turner, MRN:  [id number], DOB:  August 20, 1971, LOS: 6 ADMISSION DATE:  05/29/2024, CONSULTATION DATE:  05/30/2024 REFERRING MD:  Benton Shone, MD, CHIEF COMPLAINT:  SOB  History of Present Illness:  53 y/o male with PMH for Alcoholic Cirrhosis, Esophageal Varices s/p clipping, COPD, active drinker (1 pint Vodka daily) and smoker (1ppd), Ascites s/p multiple paracentesis, DT's, Withdrawal Seizures, recent MRI abd showing right pleural effusion.  He presented with SOB today.  He says for the last 3 days he has been very weak, not able to get out of bed much.  He has been having N/V/D.  He cannot hold much down and has not taken his meds in a couple days as a result.  He is more SOB and has a dry cough and has been wheezing.  He has an Albuterol  inhaler which he uses prn.  He also has abd pain throughout.  Apparently he was sitting on his porch and found himself to be light headed, almost passing out and fast heart rate.  EMS found him with HR 180's and gave him a 100J shock.  His HR decreased to the 160's.  He remained hypotensive.  His LA was 7.83.  A paracentesis was done in the ED and he had a paracentesis 1 week ago outpatient with about 2 liters removed.  Pertinent  Medical History  Alcoholic Cirrhosis, Esophageal Varices s/p clipping, COPD, active drinker (1 pint Vodka daily) and smoker (1ppd), Ascites s/p multiple paracentesis, DT's, Withdrawal Seizures, recent MRI abd showing right pleural effusion.    Significant Hospital Events: Including procedures, antibiotic start and stop dates in addition to other pertinent events   8/24: Admit to ICU 8/25: Off pressors, worsening hypoxia requiring intubation 8/26: Back on pressors, central line placed overnight 8/27: Increased FiO2 requirement overnight 8/28: Improving pressor requirement with weaning sedation, tolerating diureses, decreasing FiO2/ PEEP, mother changed to DNR 8/29 Down trending vasopressor support, NE 3, vaso 0.03 FiO2  60, peep 10 8/30 no acute events overnight, ventilator settings improved down to 40% FiO2 and 8 of PEEP  Interim History / Subjective:  Seen while getting bed bath, eyes spontaneously open  Objective    Blood pressure (!) 104/52, pulse (!) 122, temperature (!) 100.9 F (38.3 C), temperature source Oral, resp. rate (!) 9, height 5' 11 (1.803 m), weight 99.9 kg, SpO2 95%.    Vent Mode: PRVC FiO2 (%):  [40 %] 40 % Set Rate:  [28 bmp] 28 bmp Vt Set:  [450 mL] 450 mL PEEP:  [8 cmH20] 8 cmH20 Plateau Pressure:  [20 cmH20] 20 cmH20   Intake/Output Summary (Last 24 hours) at 06/05/2024 0826 Last data filed at 06/05/2024 0800 Gross per 24 hour  Intake 3471.87 ml  Output 3200 ml  Net 271.87 ml   Filed Weights   06/03/24 0500 06/04/24 0500 06/05/24 0425  Weight: 100.9 kg 102.6 kg 99.9 kg    Examination: General: Acute on chronic ill-appearing middle-aged male lying in bed lying in bed on mechanical ventilation no acute distress HEENT: ETT, MM pink/moist, PERRL,  Neuro: Eyes spontaneously open and not consistently following commands CV: s1s2 regular rate and rhythm, no murmur, rubs, or gallops,  PULM: Clear to auscultation bilaterally, no increased work of breathing, no added breath sounds GI: soft, bowel sounds active in all 4 quadrants, non-tender, non-distended, tolerating TF Extremities: warm/dry, 1+ pitting edema to feet  Skin: no rashes or lesions  Resolved problem list   Assessment and Plan   Acute  hypoxic respiratory failure, 2/2 ARDS in the setting of aspiration, LLL PNA -Increasing oxygen requirements 8/25 requiring intubation. Suspect ARDS in the setting of aspiration +/- volume overload. . Bedside US  8/25 with small to moderate pleural effusion and small pocket ascites, both unlikely to be contributing to decompensation.  P:  Continue ventilator support with lung protective strategies  Wean PEEP and FiO2 for sats greater than 90%. Head of bed elevated 30  degrees. Plateau pressures less than 30 cm H20.  Follow intermittent chest x-ray and ABG.   SAT/SBT as tolerated, mentation preclude extubation  Ensure adequate pulmonary hygiene  Follow cultures  VAP bundle in place  PAD protocol Continue Zosyn  and doxycycline  Continue steroids  Decompensated alcoholic cirrhosis with recurrent ascites Portal hypertensive gastropathy History of esophageal varices  Associated hyperammonemia, hyperbilirubinemia, coagulopathy, thrombocytopenia, and elevated LFTs -MELD score 30 on 8/25. Current drinker (about 1 pint vodka/day). Diagnostic para in ED did not look consistent with SBP. Followed by [facility] and established with [facility]. Unlikely transplant candidate at this time due to current drinking.  -NAC stopped 8/29 P: GI following peripherally, appreciate assistance Continue to diurese as able Continue rifaximin  Continue lactulose  3 times daily, titrate to BMs Consider initiation of home nadolol , eplerenone , and spironolactone  when hemodynamics improve SCDs only for DVT prophylaxis given thrombocytopenia Continue to trend CMP, CBC, and coags daily Continue midodrine   AKI, improving Hyponatremia, stable  Hypokalemia -Acute renal failure likely multifactorial in the setting of hypoxic respiratory failure, shock and decompensated cirrhosis. Baseline Cr~0.6-0.8.  P: Follow renal function  Monitor urine output Trend Bmet Avoid nephrotoxins Ensure adequate renal perfusion  Continue to diurese Continue Foley for strict intake and output  Shock- 2/2 sepsis/ sedation P: Continue pressors for MAP goal greater than 65 Antibiotics as above Continue to follow cultures Continue midodrine  15 mg 3 times daily Remains on stress dose steroids  Hypoalbuminemia P: Continue tube feeds  EtOH abuse  P: Supplement thiamine , folate, multivitamin Seizure precautions CIWA protocol Cessation education when  appropriate  Hyperglycemia P: Continue SSI CBG goal 140-180 CBG checks every 4  Best Practice (right click and Reselect all SmartList Selections daily)   Diet/type: NPO; EN per RD DVT prophylaxis SCD only with thrombocytopenia Pressure ulcer(s): N/A GI prophylaxis: PPI Lines: Central line and Arterial Line, still needed Foley:  yes, and currently needs Code Status:  DNR/ DNI  Mother and sister updated at bedside 8/29  Critical care   CRITICAL CARE Performed by: Topanga Alvelo D. Harris   Total critical care time: 40 minutes  Critical care time was exclusive of separately billable procedures and treating other patients.  Critical care was necessary to treat or prevent imminent or life-threatening deterioration.  Critical care was time spent personally by me on the following activities: development of treatment plan with patient and/or surrogate as well as nursing, discussions with consultants, evaluation of patient's response to treatment, examination of patient, obtaining history from patient or surrogate, ordering and performing treatments and interventions, ordering and review of laboratory studies, ordering and review of radiographic studies, pulse oximetry and re-evaluation of patient's condition.  Sanaiya Welliver D. Harris, NP-C [facility] Personal contact information can be found on Amion  If no contact or response made please call [phone] 06/05/2024, 8:30 AM

## 2024-06-05 NOTE — Progress Notes (Signed)
   06/05/24 0745  Daily Weaning Assessment  Daily Assessment of Readiness to Wean Wean protocol criteria met (SBT performed)  SBT Method CPAP 5 cm H20 and PS 5 cm H20  Weaning Start Time 0745  Patient response Failed SBT terminated  Reason SBT Terminated Excessive agitation, marked accessory muscle use, abdominal paradox, or diaphoresis noted    RT placed pt on PS/CPAP 5/5 on 40%. Pt's RR dropped to 7 and took in large volumes greater than 700 VT. Abdominal & accessory WOB increased. RT flipped pt back to full support and pt returned to baseline. CCM aware.

## 2024-06-06 ENCOUNTER — Inpatient Hospital Stay (HOSPITAL_COMMUNITY)

## 2024-06-06 DIAGNOSIS — K7031 Alcoholic cirrhosis of liver with ascites: Secondary | ICD-10-CM | POA: Diagnosis not present

## 2024-06-06 DIAGNOSIS — Z515 Encounter for palliative care: Secondary | ICD-10-CM

## 2024-06-06 DIAGNOSIS — Z7189 Other specified counseling: Secondary | ICD-10-CM

## 2024-06-06 DIAGNOSIS — R6521 Severe sepsis with septic shock: Secondary | ICD-10-CM | POA: Diagnosis not present

## 2024-06-06 DIAGNOSIS — K7290 Hepatic failure, unspecified without coma: Secondary | ICD-10-CM | POA: Diagnosis not present

## 2024-06-06 DIAGNOSIS — J9602 Acute respiratory failure with hypercapnia: Secondary | ICD-10-CM | POA: Diagnosis not present

## 2024-06-06 DIAGNOSIS — J9601 Acute respiratory failure with hypoxia: Secondary | ICD-10-CM

## 2024-06-06 DIAGNOSIS — J189 Pneumonia, unspecified organism: Secondary | ICD-10-CM | POA: Diagnosis not present

## 2024-06-06 DIAGNOSIS — A419 Sepsis, unspecified organism: Secondary | ICD-10-CM | POA: Diagnosis not present

## 2024-06-06 LAB — MAGNESIUM: Magnesium: 2.3 mg/dL (ref 1.7–2.4)

## 2024-06-06 LAB — COMPREHENSIVE METABOLIC PANEL WITH GFR
ALT: 144 U/L — ABNORMAL HIGH (ref 0–44)
AST: 249 U/L — ABNORMAL HIGH (ref 15–41)
Albumin: 2.2 g/dL — ABNORMAL LOW (ref 3.5–5.0)
Alkaline Phosphatase: 104 U/L (ref 38–126)
Anion gap: 12 (ref 5–15)
BUN: 79 mg/dL — ABNORMAL HIGH (ref 6–20)
CO2: 29 mmol/L (ref 22–32)
Calcium: 8.2 mg/dL — ABNORMAL LOW (ref 8.9–10.3)
Chloride: 103 mmol/L (ref 98–111)
Creatinine, Ser: 1.61 mg/dL — ABNORMAL HIGH (ref 0.61–1.24)
GFR, Estimated: 51 mL/min — ABNORMAL LOW (ref >60)
Glucose, Bld: 177 mg/dL — ABNORMAL HIGH (ref 70–99)
Potassium: 4.1 mmol/L (ref 3.5–5.1)
Sodium: 144 mmol/L (ref 135–145)
Total Bilirubin: 21.5 mg/dL (ref 0.0–1.2)
Total Protein: 6 g/dL — ABNORMAL LOW (ref 6.5–8.1)

## 2024-06-06 LAB — BODY FLUID CELL COUNT WITH DIFFERENTIAL
Eos, Fluid: 0 %
Lymphs, Fluid: 20 %
Monocyte-Macrophage-Serous Fluid: 73 % (ref 50–90)
Neutrophil Count, Fluid: 7 % (ref 0–25)
Total Nucleated Cell Count, Fluid: 114 uL (ref 0–1000)

## 2024-06-06 LAB — CULTURE, RESPIRATORY W GRAM STAIN: Gram Stain: NONE SEEN

## 2024-06-06 LAB — TYPE AND SCREEN
ABO/RH(D): O NEG
Antibody Screen: NEGATIVE

## 2024-06-06 LAB — GLUCOSE, CAPILLARY
Glucose-Capillary: 141 mg/dL — ABNORMAL HIGH (ref 70–99)
Glucose-Capillary: 142 mg/dL — ABNORMAL HIGH (ref 70–99)
Glucose-Capillary: 148 mg/dL — ABNORMAL HIGH (ref 70–99)
Glucose-Capillary: 150 mg/dL — ABNORMAL HIGH (ref 70–99)
Glucose-Capillary: 190 mg/dL — ABNORMAL HIGH (ref 70–99)
Glucose-Capillary: 191 mg/dL — ABNORMAL HIGH (ref 70–99)
Glucose-Capillary: 217 mg/dL — ABNORMAL HIGH (ref 70–99)

## 2024-06-06 LAB — CBC
HCT: 33.1 % — ABNORMAL LOW (ref 39.0–52.0)
Hemoglobin: 11.4 g/dL — ABNORMAL LOW (ref 13.0–17.0)
MCH: 35.5 pg — ABNORMAL HIGH (ref 26.0–34.0)
MCHC: 34.4 g/dL (ref 30.0–36.0)
MCV: 103.1 fL — ABNORMAL HIGH (ref 80.0–100.0)
Platelets: 42 K/uL — ABNORMAL LOW (ref 150–400)
RBC: 3.21 MIL/uL — ABNORMAL LOW (ref 4.22–5.81)
RDW: 18.7 % — ABNORMAL HIGH (ref 11.5–15.5)
WBC: 7.6 K/uL (ref 4.0–10.5)
nRBC: 0 % (ref 0.0–0.2)

## 2024-06-06 MED ORDER — ALBUMIN HUMAN 25 % IV SOLN
50.0000 g | Freq: Once | INTRAVENOUS | Status: AC
  Administered 2024-06-06: 50 g via INTRAVENOUS
  Filled 2024-06-06: qty 200

## 2024-06-06 MED ORDER — LACTULOSE 10 GM/15ML PO SOLN
20.0000 g | Freq: Four times a day (QID) | ORAL | Status: DC
  Administered 2024-06-06 – 2024-06-08 (×8): 20 g
  Filled 2024-06-06 (×8): qty 30

## 2024-06-06 MED ORDER — PIPERACILLIN-TAZOBACTAM 3.375 G IVPB
3.3750 g | Freq: Three times a day (TID) | INTRAVENOUS | Status: DC
  Administered 2024-06-06 – 2024-06-08 (×7): 3.375 g via INTRAVENOUS
  Filled 2024-06-06 (×7): qty 50

## 2024-06-06 MED ORDER — ALBUMIN HUMAN 5 % IV SOLN
INTRAVENOUS | Status: AC
  Administered 2024-06-06: 12.5 g
  Filled 2024-06-06: qty 250

## 2024-06-06 NOTE — Progress Notes (Signed)
 NAME:  Omar Turner, MRN:  [id number], DOB:  1971-04-07, LOS: 7 ADMISSION DATE:  05/29/2024, CONSULTATION DATE:  05/30/2024 REFERRING MD:  Benton Shone, MD, CHIEF COMPLAINT:  SOB  History of Present Illness:  53 y/o male with PMH for Alcoholic Cirrhosis, Esophageal Varices s/p clipping, COPD, active drinker (1 pint Vodka daily) and smoker (1ppd), Ascites s/p multiple paracentesis, DT's, Withdrawal Seizures, recent MRI abd showing right pleural effusion.  He presented with SOB today.  He says for the last 3 days he has been very weak, not able to get out of bed much.  He has been having N/V/D.  He cannot hold much down and has not taken his meds in a couple days as a result.  He is more SOB and has a dry cough and has been wheezing.  He has an Albuterol  inhaler which he uses prn.  He also has abd pain throughout.  Apparently he was sitting on his porch and found himself to be light headed, almost passing out and fast heart rate.  EMS found him with HR 180's and gave him a 100J shock.  His HR decreased to the 160's.  He remained hypotensive.  His LA was 7.83.  A paracentesis was done in the ED and he had a paracentesis 1 week ago outpatient with about 2 liters removed.  Pertinent  Medical History  Alcoholic Cirrhosis, Esophageal Varices s/p clipping, COPD, active drinker (1 pint Vodka daily) and smoker (1ppd), Ascites s/p multiple paracentesis, DT's, Withdrawal Seizures, recent MRI abd showing right pleural effusion.    Significant Hospital Events: Including procedures, antibiotic start and stop dates in addition to other pertinent events   8/24: Admit to ICU 8/25: Off pressors, worsening hypoxia requiring intubation 8/26: Back on pressors, central line placed overnight 8/27: Increased FiO2 requirement overnight 8/28: Improving pressor requirement with weaning sedation, tolerating diureses, decreasing FiO2/ PEEP, mother changed to DNR 8/29 Down trending vasopressor support, NE 3, vaso 0.03 FiO2  60, peep 10 8/30 no acute events overnight, ventilator settings improved down to 40% FiO2 and 8 of PEEP 8/31 tolerating spontaneous breathing trial but mental status precludes extubation, off vasopressors  Interim History / Subjective:  No overnight issues, tolerating spontaneous breathing trial, mental status precludes extubation Came off of vasopressor support  Objective    Blood pressure (!) 97/58, pulse 82, temperature 98.6 F (37 C), temperature source Axillary, resp. rate 11, height 5' 11 (1.803 m), weight 100.2 kg, SpO2 92%.    Vent Mode: PRVC FiO2 (%):  [40 %] 40 % Set Rate:  [28 bmp] 28 bmp Vt Set:  [450 mL] 450 mL PEEP:  [5 cmH20] 5 cmH20 Plateau Pressure:  [15 cmH20-18 cmH20] 18 cmH20   Intake/Output Summary (Last 24 hours) at 06/06/2024 9081 Last data filed at 06/06/2024 0800 Gross per 24 hour  Intake 2911.87 ml  Output 1350 ml  Net 1561.87 ml   Filed Weights   06/04/24 0500 06/05/24 0425 06/06/24 0500  Weight: 102.6 kg 99.9 kg 100.2 kg    Examination: General: Crtitically ill-appearing middle-aged male, orally intubated.  Grossly jaundiced HEENT: [place]/AT, ETT and OGT in place Neuro: Opens eyes with vocal stimuli, intermittently following commands, restless and agitated Chest: Reduced air entry at the bases bilaterally right more than left no wheezes or rhonchi Heart: Regular rate and rhythm, no murmurs or gallops Abdomen: Soft, distended, bowel sounds present  Labs and images reviewed  Patient Lines/Drains/Airways Status     Active Line/Drains/Airways     Name  Placement date Placement time Site Days   Arterial Line 06/02/24 Right Radial 06/02/24  0340  Radial  4   Peripheral IV 05/31/24 22 G Posterior;Right Forearm 05/31/24  1800  Forearm  6   Peripheral IV 05/31/24 20 G Posterior;Proximal;Right Forearm 05/31/24  2100  Forearm  6   CVC Triple Lumen 05/31/24 Left Internal jugular 05/31/24  2200  -- 6   NG/OG Vented/Dual Lumen Oral 65 cm 05/31/24  1730  Oral   6   Urethral Catheter Catarina Dennis RN Non-latex 14 Fr. 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 Continue lung protective ventilation VAP prevention bundle in place PAD protocol with Precedex  with RASS goal -1 He is off fentanyl  Tolerating spontaneous breathing trial but mental status precludes extubation Continues to have greenish secretions via ET tube Will repeat respiratory culture Bedside ultrasonography showed large right-sided pleural effusion and small left-sided pleural effusion 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 Will proceed with thoracentesis Continue IV antibiotic to complete 10-day therapy with Zosyn  Came off of vasopressor support Stop stress dose steroid  Decompensated alcoholic cirrhosis with recurrent ascites Portal hypertensive gastropathy, esophageal varices status post banding Hepatic encephalopathy Associated hyperammonemia, hyperbilirubinemia, coagulopathy, thrombocytopenia, and elevated LFTs Alcohol abuse MELD score 31 Continue to drink alcohol, 1 Painter vodka per day SBP was ruled out He continued to have recurrent ascites and grossly jaundiced currently He follows [facility], established with [facility], not a transplant candidate at this time due to active drinking Completed NAC therapy Increase lactulose  to 20 g q6h, continue rifaximin  Hold diuretics, spironolactone  and nadolol  for now Continue midodrine  Watch for signs of withdrawal Continue thiamine  and folate  Acute kidney injury due to aggressive diuresis Hyponatremia, resolved Hypokalemia, resolved Patient serum creatinine  trended up to 1.6 likely due to aggressive diuresis Holding Lasix  today Monitor intake and output Avoid nephrotoxic agent Electrolytes are corrected    The patient is critically ill due to acute respiratory failure with hypoxia and hypercapnia.  Decompensated liver cirrhosis.  Critical care was necessary to treat or prevent imminent or life-threatening deterioration.  Critical care was time spent personally by me on the following activities: development of treatment plan with patient and/or surrogate as well as nursing, discussions with consultants, evaluation of patient's response to treatment, examination of patient, obtaining history from patient or surrogate, ordering and performing treatments and interventions, ordering and review of laboratory studies, ordering and review of radiographic studies, pulse oximetry, re-evaluation of patient's condition and participation in multidisciplinary rounds.   During this encounter critical care time was devoted to patient care services described in this note for 41 minutes.     Valinda Novas, MD [facility] See Amion for pager If no response to pager, please call [phone] until 7pm After 7pm, Please call E-link [phone]

## 2024-06-06 NOTE — Procedures (Addendum)
 Paracentesis Procedure Note  Omar Turner  [id number]  1971-06-10  Date:06/06/24  Time:12:30 PM   Provider Performing:Ronne Stefanski    Procedure: Paracentesis with imaging guidance (50916)  Indication(s) Ascites  Consent Risks of the procedure as well as the alternatives and risks of each were explained to the patient and/or caregiver.  Consent for the procedure was obtained and is signed in the bedside chart  Anesthesia Topical only with 1% lidocaine     Time Out Verified patient identification, verified procedure, site/side was marked, verified correct patient position, special equipment/implants available, medications/allergies/relevant history reviewed, required imaging and test results available.   Sterile Technique Maximal sterile technique including full sterile barrier drape, hand hygiene, sterile gown, sterile gloves, mask, hair covering, sterile ultrasound probe cover (if used).   Procedure Description Ultrasound used to identify appropriate peritoneal anatomy for placement and overlying skin marked.  Area of drainage cleaned and draped in sterile fashion. Lidocaine  was used to anesthetize the skin and subcutaneous tissue.  4500 cc's of Yellowish appearing fluid was drained. Catheter then removed and bandaid applied to site.   Complications/Tolerance None; patient tolerated the procedure well.   EBL Minimal   Specimen(s) None

## 2024-06-06 NOTE — Plan of Care (Signed)
  Problem: Skin Integrity: Goal: Risk for impaired skin integrity will decrease Outcome: Progressing   Problem: Tissue Perfusion: Goal: Adequacy of tissue perfusion will improve Outcome: Progressing   Problem: Clinical Measurements: Goal: Will remain free from infection Outcome: Progressing Goal: Diagnostic test results will improve Outcome: Progressing Goal: Respiratory complications will improve Outcome: Progressing Goal: Cardiovascular complication will be avoided Outcome: Progressing   Problem: Activity: Goal: Risk for activity intolerance will decrease Outcome: Progressing   Problem: Nutrition: Goal: Adequate nutrition will be maintained Outcome: Progressing   Problem: Coping: Goal: Level of anxiety will decrease Outcome: Progressing   Problem: Elimination: Goal: Will not experience complications related to bowel motility Outcome: Progressing Goal: Will not experience complications related to urinary retention Outcome: Progressing
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
## 2024-06-06 NOTE — Procedures (Signed)
 Bedside chest and abdominal ultrasonography performed  Showing moderate to large ascites and large right-sided pleural effusion

## 2024-06-07 ENCOUNTER — Inpatient Hospital Stay (HOSPITAL_COMMUNITY)

## 2024-06-07 DIAGNOSIS — R6520 Severe sepsis without septic shock: Secondary | ICD-10-CM | POA: Diagnosis not present

## 2024-06-07 DIAGNOSIS — J189 Pneumonia, unspecified organism: Secondary | ICD-10-CM | POA: Diagnosis not present

## 2024-06-07 DIAGNOSIS — E876 Hypokalemia: Secondary | ICD-10-CM

## 2024-06-07 DIAGNOSIS — E870 Hyperosmolality and hypernatremia: Secondary | ICD-10-CM

## 2024-06-07 DIAGNOSIS — Z7189 Other specified counseling: Secondary | ICD-10-CM | POA: Diagnosis not present

## 2024-06-07 DIAGNOSIS — N179 Acute kidney failure, unspecified: Secondary | ICD-10-CM | POA: Diagnosis not present

## 2024-06-07 DIAGNOSIS — A419 Sepsis, unspecified organism: Secondary | ICD-10-CM | POA: Diagnosis not present

## 2024-06-07 DIAGNOSIS — J9601 Acute respiratory failure with hypoxia: Secondary | ICD-10-CM | POA: Diagnosis not present

## 2024-06-07 DIAGNOSIS — K7031 Alcoholic cirrhosis of liver with ascites: Secondary | ICD-10-CM | POA: Diagnosis not present

## 2024-06-07 LAB — COMPREHENSIVE METABOLIC PANEL WITH GFR
ALT: 121 U/L — ABNORMAL HIGH (ref 0–44)
AST: 184 U/L — ABNORMAL HIGH (ref 15–41)
Albumin: 2.8 g/dL — ABNORMAL LOW (ref 3.5–5.0)
Alkaline Phosphatase: 78 U/L (ref 38–126)
Anion gap: 14 (ref 5–15)
BUN: 88 mg/dL — ABNORMAL HIGH (ref 6–20)
CO2: 30 mmol/L (ref 22–32)
Calcium: 8.4 mg/dL — ABNORMAL LOW (ref 8.9–10.3)
Chloride: 105 mmol/L (ref 98–111)
Creatinine, Ser: 1.54 mg/dL — ABNORMAL HIGH (ref 0.61–1.24)
GFR, Estimated: 54 mL/min — ABNORMAL LOW (ref >60)
Glucose, Bld: 135 mg/dL — ABNORMAL HIGH (ref 70–99)
Potassium: 3.9 mmol/L (ref 3.5–5.1)
Sodium: 149 mmol/L — ABNORMAL HIGH (ref 135–145)
Total Bilirubin: 21.5 mg/dL (ref 0.0–1.2)
Total Protein: 5.8 g/dL — ABNORMAL LOW (ref 6.5–8.1)

## 2024-06-07 LAB — GLUCOSE, CAPILLARY
Glucose-Capillary: 113 mg/dL — ABNORMAL HIGH (ref 70–99)
Glucose-Capillary: 113 mg/dL — ABNORMAL HIGH (ref 70–99)
Glucose-Capillary: 117 mg/dL — ABNORMAL HIGH (ref 70–99)
Glucose-Capillary: 122 mg/dL — ABNORMAL HIGH (ref 70–99)
Glucose-Capillary: 123 mg/dL — ABNORMAL HIGH (ref 70–99)
Glucose-Capillary: 172 mg/dL — ABNORMAL HIGH (ref 70–99)

## 2024-06-07 LAB — CBC
HCT: 32.3 % — ABNORMAL LOW (ref 39.0–52.0)
Hemoglobin: 11 g/dL — ABNORMAL LOW (ref 13.0–17.0)
MCH: 34.7 pg — ABNORMAL HIGH (ref 26.0–34.0)
MCHC: 34.1 g/dL (ref 30.0–36.0)
MCV: 101.9 fL — ABNORMAL HIGH (ref 80.0–100.0)
Platelets: 46 K/uL — ABNORMAL LOW (ref 150–400)
RBC: 3.17 MIL/uL — ABNORMAL LOW (ref 4.22–5.81)
RDW: 18.7 % — ABNORMAL HIGH (ref 11.5–15.5)
WBC: 10.9 K/uL — ABNORMAL HIGH (ref 4.0–10.5)
nRBC: 0.2 % (ref 0.0–0.2)

## 2024-06-07 LAB — PHOSPHORUS: Phosphorus: 5.7 mg/dL — ABNORMAL HIGH (ref 2.5–4.6)

## 2024-06-07 LAB — MAGNESIUM: Magnesium: 2.7 mg/dL — ABNORMAL HIGH (ref 1.7–2.4)

## 2024-06-07 MED ORDER — ETOMIDATE 2 MG/ML IV SOLN
INTRAVENOUS | Status: AC
  Administered 2024-06-07: 10 mg via INTRAVENOUS
  Filled 2024-06-07: qty 10

## 2024-06-07 MED ORDER — METOCLOPRAMIDE HCL 5 MG/ML IJ SOLN
10.0000 mg | Freq: Three times a day (TID) | INTRAMUSCULAR | Status: DC
  Administered 2024-06-07 – 2024-06-08 (×4): 10 mg via INTRAVENOUS
  Filled 2024-06-07 (×4): qty 2

## 2024-06-07 MED ORDER — ACETAMINOPHEN 650 MG RE SUPP: 650.0000 mg | Freq: Three times a day (TID) | RECTAL | Status: DC | PRN

## 2024-06-07 MED ORDER — ALBUMIN HUMAN 25 % IV SOLN
50.0000 g | Freq: Once | INTRAVENOUS | Status: AC
  Administered 2024-06-07: 12.5 g via INTRAVENOUS
  Filled 2024-06-07: qty 200

## 2024-06-07 MED ORDER — ETOMIDATE 2 MG/ML IV SOLN: 10.0000 mg | Freq: Once | INTRAVENOUS | Status: AC

## 2024-06-07 MED ORDER — DEXTROSE 5 % IV SOLN
500.0000 mg | Freq: Once | INTRAVENOUS | Status: AC
  Administered 2024-06-07: 500 mg via INTRAVENOUS
  Filled 2024-06-07: qty 17.8

## 2024-06-07 NOTE — Procedures (Signed)
 Thoracentesis  Procedure Note  BILAL MANZER  [id number]  [birth year]  Date:06/07/24  Time:8:52 AM   Provider Performing:Corona Popovich C Claudene   Procedure: Thoracentesis with imaging guidance ([id number])  Indication(s) Pleural Effusion  Consent Risks of the procedure as well as the alternatives and risks of each were explained to the patient and/or caregiver.  Consent for the procedure was obtained and is signed in the bedside chart  Anesthesia Topical only with 1% lidocaine     Time Out Verified patient identification, verified procedure, site/side was marked, verified correct patient position, special equipment/implants available, medications/allergies/relevant history reviewed, required imaging and test results available.   Sterile Technique Maximal sterile technique including full sterile barrier drape, hand hygiene, sterile gown, sterile gloves, mask, hair covering, sterile ultrasound probe cover (if used).  Procedure Description Ultrasound was used to identify appropriate pleural anatomy for placement and overlying skin marked.  Area of drainage cleaned and draped in sterile fashion. Lidocaine  was used to anesthetize the skin and subcutaneous tissue.  1400 cc's of straw appearing fluid was drained from the right pleural space. Catheter then removed and bandaid applied to site.   Complications/Tolerance None; patient tolerated the procedure well. Chest X-ray is ordered to confirm no post-procedural complication.   EBL Minimal   Specimen(s) Pleural fluid

## 2024-06-07 NOTE — Progress Notes (Signed)
 Pt placed on PS/CPAP mode 5/5 40% and is tolerating well at this time. RN aware.

## 2024-06-07 NOTE — Progress Notes (Addendum)
 NAME:  Omar Turner, MRN:  [id number], DOB:  01/31/1971, LOS: 8 ADMISSION DATE:  05/29/2024, CONSULTATION DATE:  05/30/2024 REFERRING MD:  Benton Shone, MD, CHIEF COMPLAINT:  SOB  History of Present Illness:  53 y/o male with PMH for Alcoholic Cirrhosis, Esophageal Varices s/p clipping, COPD, active drinker (1 pint Vodka daily) and smoker (1ppd), Ascites s/p multiple paracentesis, DT's, Withdrawal Seizures, recent MRI abd showing right pleural effusion.  He presented with SOB today.  He says for the last 3 days he has been very weak, not able to get out of bed much.  He has been having N/V/D.  He cannot hold much down and has not taken his meds in a couple days as a result.  He is more SOB and has a dry cough and has been wheezing.  He has an Albuterol  inhaler which he uses prn.  He also has abd pain throughout.  Apparently he was sitting on his porch and found himself to be light headed, almost passing out and fast heart rate.  EMS found him with HR 180's and gave him a 100J shock.  His HR decreased to the 160's.  He remained hypotensive.  His LA was 7.83.  A paracentesis was done in the ED and he had a paracentesis 1 week ago outpatient with about 2 liters removed.  Pertinent  Medical History  Alcoholic Cirrhosis, Esophageal Varices s/p clipping, COPD, active drinker (1 pint Vodka daily) and smoker (1ppd), Ascites s/p multiple paracentesis, DT's, Withdrawal Seizures, recent MRI abd showing right pleural effusion.    Significant Hospital Events: Including procedures, antibiotic start and stop dates in addition to other pertinent events   8/24: Admit to ICU 8/25: Off pressors, worsening hypoxia requiring intubation 8/26: Back on pressors, central line placed overnight 8/27: Increased FiO2 requirement overnight 8/28: Improving pressor requirement with weaning sedation, tolerating diureses, decreasing FiO2/ PEEP, mother changed to DNR 8/29 Down trending vasopressor support, NE 3, vaso 0.03 FiO2  60, peep 10 8/30 no acute events overnight, ventilator settings improved down to 40% FiO2 and 8 of PEEP 8/31 tolerating spontaneous breathing trial but mental status precludes extubation, off vasopressors; para and thora  Interim History / Subjective:  Abd more distended this am, doing well on PS just restless even with precedex   Objective    Blood pressure 98/63, pulse 90, temperature (!) 100.4 F (38 C), temperature source Rectal, resp. rate 16, height 5' 11 (1.803 m), weight 102 kg, SpO2 95%.    Vent Mode: PSV;CPAP FiO2 (%):  [40 %] 40 % Set Rate:  [28 bmp] 28 bmp Vt Set:  [450 mL] 450 mL PEEP:  [5 cmH20] 5 cmH20 Pressure Support:  [5 cmH20] 5 cmH20 Plateau Pressure:  [17 cmH20-20 cmH20] 20 cmH20   Intake/Output Summary (Last 24 hours) at 06/07/2024 9047 Last data filed at 06/07/2024 0800 Gross per 24 hour  Intake 3354.02 ml  Output 7950 ml  Net -4595.98 ml   Filed Weights   06/05/24 0425 06/06/24 0500 06/07/24 0500  Weight: 99.9 kg 100.2 kg 102 kg    Examination: Jaundiced Lungs diminished R>L base, triggers vent Abd protuberant, tympanic, minimal remaining ascites w US  Occasionally follows commands RASS +1  LFTs improved Stable bili Cr ok Pleural fluid neg for infection  Assessment and Plan  Acute respiratory failure with hypoxia and hypercapnia Severe sepsis with septic shock bilateral aspiration pneumonia with ARDS, POA Bilateral pleural effusion right more than left, 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 Decompensated alcoholic cirrhosis  with recurrent ascites Portal hypertensive gastropathy, esophageal varices status post banding Hepatic encephalopathy Associated hyperammonemia, hyperbilirubinemia, coagulopathy, thrombocytopenia, and elevated LFTs Alcohol abuse Acute kidney injury due to aggressive diuresis- stable 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  Hypernatremia Hypokalemia  - OG hooked to suction and 1L of TF plus fair bit of air came out, abd  now much softer - Think he has some ileus but still having BM so more of a gastroparesis subtype - Place NGT, wean to extubate - okay for meds through NGT, start reglan , can likely start trickles tomorrow - Lactulose /rifaxamin, okay to clamp for this - DC A-line - Complete short course of zosyn  as ordered - SBP goal 80, continue midodrine  but absorption questionable - Once gut is better can do FWF; will give diuril +albumin  x 1 - Replete K - Guarded prognosis, DNR, will see how things go, not transplant candidate at present - Will need extensive rehab if recovers - Family updated at bedside  40 min cc time Rolan Sharps MD [place]

## 2024-06-07 NOTE — Procedures (Signed)
 Extubation Procedure Note  Patient Details:   Name: Omar Turner DOB: 08/03/1971 MRN: [id number]   Airway Documentation:    Vent end date: 06/07/24 Vent end time: 1042   Evaluation  O2 sats: stable throughout Complications: No apparent complications Patient did tolerate procedure well. Bilateral Breath Sounds: Clear, Diminished   Yes Pt was successfully extubated with no apparent complications. Pt was placed on 4L [place] and is stable at this time. Audible cuffleak heard with Vte loss noted, no signs of stridor at this time.  Germain JAYSON Mater 06/07/2024, 10:43 AM

## 2024-06-07 NOTE — Evaluation (Signed)
 Physical Therapy Evaluation Patient Details Name: Omar Turner MRN: [id number] DOB: 1970/12/23 Today's Date: 06/07/2024  History of Present Illness  53 y/o male admitted 8/23 with SOB. 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 Extubated 9/1 PMH for Alcoholic Cirrhosis, Esophageal Varices s/p clipping, COPD, active drinker (1 pint Vodka daily) and smoker (1ppd), Ascites s/p multiple paracentesis, DT's, Withdrawal Seizures, recent MRI abd showing right pleural effusion.  He presented with SOB today.  Clinical Impression  Pt admitted with above diagnosis. Mother reports pt independent at home but not working until about 2 weeks ago when his functional status declined. Has not been out of bed due to nausea and vomiting prior to admission. He was able to open eyes and locate mother in room after elevating HOB and placing into seated position in egress mode on bed. He smiled when he saw her. Could not follow any other commands during visit. Very tired, required max cues to engage and attention only held briefly a couple times to look at therapist and track. Moving restlessly in bed at times. Total assist required to reposition in center of bed. BP 91/58 (MAP 69) in egress position. Will follow and update recs as function evolves. Pt currently with functional limitations due to the deficits listed below (see PT Problem List). Pt will benefit from acute skilled PT to increase their independence and safety with mobility to allow discharge.           If plan is discharge home, recommend the following: Two people to help with walking and/or transfers;Two people to  help with bathing/dressing/bathroom;Supervision due to cognitive status;Help with stairs or ramp for entrance;Assist for transportation;Direct supervision/assist for financial management;Direct supervision/assist for medications management;Assistance with feeding;Assistance with cooking/housework   Can travel by Surveyor, quantity;Hospital bed;Wheelchair cushion (measurements PT);Wheelchair (measurements PT)  Recommendations for Other Services       Functional Status Assessment Patient has had a recent decline in their functional status and/or demonstrates limited ability to make significant improvements in function in a reasonable and predictable amount of time     Precautions / Restrictions Precautions Precautions: Fall Recall of Precautions/Restrictions: Impaired      Mobility  Bed Mobility Overal bed mobility: Needs Assistance             General bed mobility comments: Total assist to reposition in bed. Restless and pushing himself side to side in bed, unable to self correct. Bed placed into chair position with egress. Became more alert for a short period of time.    Transfers                   General transfer comment: defer for safety until able to follow commands.    Ambulation/Gait                  Stairs            Wheelchair Mobility     Tilt Bed    Modified Rankin (Stroke Patients Only)       Balance  Pertinent Vitals/Pain Pain Assessment Pain Assessment: CPOT Body Movements: Protection Vocalization (extubated pts.): Talking in normal tone or no sound CPOT Total: 1    Home Living Family/patient expects to be discharged to:: Unsure Living Arrangements: Parent Available Help at Discharge: Family;Available 24 hours/day Type of Home: House Home Access: Stairs to enter Entrance Stairs-Rails: None Entrance Stairs-Number  of Steps: 2   Home Layout: One level Home Equipment: None      Prior Function Prior Level of Function : Independent/Modified Independent;Driving             Mobility Comments: Mother reports pt ind PTA, not working. ADLs Comments: Mother reports pt Ind PTA, not working. Very sick for the past 2 weeks, barely out of bed.     Extremity/Trunk Assessment   Upper Extremity Assessment Upper Extremity Assessment: Defer to OT evaluation;Difficult to assess due to impaired cognition    Lower Extremity Assessment Lower Extremity Assessment: Difficult to assess due to impaired cognition (wiggled toes on Rt once to command, inconsistent.)       Communication   Communication Communication: Impaired Factors Affecting Communication: Difficulty expressing self    Cognition Arousal: Obtunded Behavior During Therapy: Flat affect, Restless, Impulsive   PT - Cognitive impairments: Difficult to assess Difficult to assess due to: Level of arousal                       Following commands: Impaired Following commands impaired: Follows one step commands with increased time     Cueing Cueing Techniques: Verbal cues, Gestural cues, Visual cues, Tactile cues     General Comments General comments (skin integrity, edema, etc.): BP 88/60 (MAP69) reclined in bed. Placed bed into egress position BP 91/58,  SPO2 93% on 4.5L, HR 80. Mother present and provided hx. He was able to open eyes turn and look at her with command and smile when he saw her. Unable to repeat a second time.    Exercises General Exercises - Lower Extremity Ankle Circles/Pumps: PROM, Both, 10 reps, Supine   Assessment/Plan    PT Assessment Patient needs continued PT services  PT Problem List Decreased strength;Decreased activity tolerance;Decreased balance;Decreased mobility;Decreased coordination;Decreased cognition;Decreased knowledge of use of DME;Decreased safety awareness;Decreased knowledge of  precautions;Cardiopulmonary status limiting activity       PT Treatment Interventions DME instruction;Gait training;Functional mobility training;Therapeutic activities;Therapeutic exercise;Balance training;Neuromuscular re-education;Patient/family education;Cognitive remediation;Wheelchair mobility training    PT Goals (Current goals can be found in the Care Plan section)  Acute Rehab PT Goals Patient Stated Goal: none stated PT Goal Formulation: With family Time For Goal Achievement: 06/21/24 Potential to Achieve Goals: Fair    Frequency Min 2X/week     Co-evaluation               AM-PAC PT 6 Clicks Mobility  Outcome Measure Help needed turning from your back to your side while in a flat bed without using bedrails?: Total Help needed moving from lying on your back to sitting on the side of a flat bed without using bedrails?: Total Help needed moving to and from a bed to a chair (including a wheelchair)?: Total Help needed standing up from a chair using your arms (e.g., wheelchair or bedside chair)?: Total Help needed to walk in hospital room?: Total Help needed climbing 3-5 steps with a railing? : Total 6 Click Score: 6    End of Session Equipment Utilized During Treatment: Oxygen Activity Tolerance: Patient limited by lethargy;Patient limited by fatigue Patient left: in  bed;with call bell/phone within reach;with bed alarm set;with nursing/sitter in room;with family/visitor present;with SCD's reapplied   PT Visit Diagnosis: Other abnormalities of gait and mobility (R26.89);Muscle weakness (generalized) (M62.81);Difficulty in walking, not elsewhere classified (R26.2);Other symptoms and signs involving the nervous system (R29.898)    Time: 8780-8766 PT Time Calculation (min) (ACUTE ONLY): 14 min   Charges:   PT Evaluation $PT Eval High Complexity: 1 High   PT General Charges $$ ACUTE PT VISIT: 1 Visit         Leontine Roads, PT, DPT [facility]   Rehabilitation Services Physical Therapist Office: [phone] Website: [email or web address]   Leontine GORMAN Roads 06/07/2024, 12:58 PM

## 2024-06-07 NOTE — Plan of Care (Signed)
  Problem: Fluid Volume: Goal: Ability to maintain a balanced intake and output will improve Outcome: Progressing   Problem: Metabolic: Goal: Ability to maintain appropriate glucose levels will improve Outcome: Progressing   Problem: Nutritional: Goal: Maintenance of adequate nutrition will improve Outcome: Progressing   Problem: Skin Integrity: Goal: Risk for impaired skin integrity will decrease Outcome: Progressing   Problem: Tissue Perfusion: Goal: Adequacy of tissue perfusion will improve Outcome: Progressing   Problem: Clinical Measurements: Goal: Ability to maintain clinical measurements within normal limits will improve Outcome: Progressing   Problem: Clinical Measurements: Goal: Will remain free from infection Outcome: Progressing   Problem: Clinical Measurements: Goal: Diagnostic test results will improve Outcome: Progressing   Problem: Clinical Measurements: Goal: Respiratory complications will improve Outcome: Progressing   Problem: Clinical Measurements: Goal: Cardiovascular complication will be avoided Outcome: Progressing   Problem: Nutrition: Goal: Adequate nutrition will be maintained Outcome: Progressing   Problem: Safety: Goal: Ability to remain free from injury will improve Outcome: Progressing   Problem: Pain Managment: Goal: General experience of comfort will improve and/or be controlled Outcome: Progressing   Problem: Skin Integrity: Goal: Risk for impaired skin integrity will decrease Outcome: Progressing   Problem: Respiratory: Goal: Ability to maintain a clear airway and adequate ventilation will improve Outcome: Progressing   Plan of care, assessment, monitoring, treatment and intervention (s) ongoing, see MAR see flowsheet

## 2024-06-07 NOTE — Progress Notes (Signed)
 Daily Progress Note   Patient Name: Omar Turner       Date: 06/07/2024 DOB: 10/19/1970  Age: 53 y.o. MRN#: [id number] Attending Physician: Claudene Toribio JAYSON, MD Primary Care Physician: Fernande Ophelia JINNY DOUGLAS, MD Admit Date: 05/29/2024  Reason for Consultation/Follow-up: Establishing goals of care  Subjective: Medical records reviewed including progress notes, labs, imaging. Patient assessed at the bedside.  He is now extubated, intermittently agitated despite Precedex  and mother providing reassurance.  Discussed with RN and MD.  We were then joined by patient's sister during the conversation and I introduced role of PMT as an extra layer of support.  She has prior experience with palliative care and very appreciative of our team's appointment, recognizing patient's poor quality of life and her prognosis.  Created space and opportunity for patient and family's thoughts and feelings on patient's current illness.  They are upset with some conflicting information from different providers regarding timing of extubation as well as interaction with PT today and the questions they were asked.  They do not expect that he will be able to go home or improve much given his cirrhosis is end-stage.  Emotional support and therapeutic listening was provided.    We discussed current MELD score of 31/52.6% 40-month mortality.  They are not surprised.  Patient has had worsening quality of life with repeated paracenteses and his sister shares that he knew his liver disease was at the end stage, though he has not discussed much about his care preferences or his wishes other than cremation.  Had detailed conversation about options including comfort care versus aggressive medical intervention.  Mother does not feel she will be able to support patient in the home any longer.  We  discussed appropriateness of hospice facility and letting nature take its course given the severity of his illness without good long-term treatment options.  They will continue to consider options.  I shared that I am back on service on Friday and that someone from PMT would provide support in the meantime.  At this time, patient's mother prefers to call PMT as needed and wait to meet with me again on Friday if there are no urgent needs.  Questions and concerns addressed. PMT will continue to support holistically.   Length of Stay: 8   Physical Exam Vitals and nursing note reviewed.  Constitutional:      Appearance: He is ill-appearing.  HENT:     Head: Normocephalic.  Cardiovascular:     Rate and Rhythm: Normal rate.  Pulmonary:     Effort: Pulmonary effort is normal.  Skin:    Coloration:  Skin is jaundiced.  Neurological:     Mental Status: He is lethargic and confused.  Psychiatric:        Behavior: Behavior is agitated.        Cognition and Memory: Cognition is impaired.             Vital Signs: BP 98/63 (BP Location: Left Arm)   Pulse 90   Temp (!) 100.4 F (38 C) (Rectal)   Resp 16   Ht 5' 11 (1.803 m)   Wt 102 kg   SpO2 95%   BMI 31.36 kg/m  SpO2: SpO2: 95 % O2 Device: O2 Device: Ventilator O2 Flow Rate: O2 Flow Rate (L/min): 50 L/min      Palliative Assessment/Data: 20%   Palliative Care Assessment & Plan   Patient Profile: 53 y.o. male  with past medical history of Alcoholic Cirrhosis, Esophageal Varices s/p clipping, COPD, active drinker (1 pint Vodka daily) and smoker (1ppd), Ascites s/p multiple paracentesis, DT's, Withdrawal Seizures, admitted on 05/29/2024 with shortness of breath, nausea/vomiting/diarrhea, weakness.    The patient was admitted for decompensated alcoholic liver cirrhosis, AKI, hypotension and lactic acidosis, bilateral aspiration pneumonia, ARDS, severe sepsis with septic shock.  Poor long-term prognosis.  This is his second  hospitalization in the past 6 months.   PMT has been consulted day 7 to assist with goals of care conversation.  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 Decompensated alcoholic cirrhosis with recurrent ascites Portal hypertensive gastropathy, esophageal varices status post banding Hepatic encephalopathy   Recommendations/Plan: Continue DNR/DNI Continue full scope otherwise Patient's mother/sister are realistic about poor prognosis Likely to transition to comfort care/hospice later this week Psychosocial and emotional support provided PMT will see again 9/5 unless called   Prognosis: Very poor long-term prognosis  Discharge Planning: To Be Determined    MDM high         Kongmeng Santoro SHAUNNA Fell, PA-C  Palliative Medicine Team Team phone # [phone]  Thank you for allowing the Palliative Medicine Team to assist in the care of this patient. Please utilize secure chat with additional questions, if there is no response within 30 minutes please call the above phone number.  Palliative Medicine Team providers are available by phone from 7am to 7pm daily and can be reached through the team cell phone.  Should this patient require assistance outside of these hours, please call the patient's attending physician.

## 2024-06-07 NOTE — Plan of Care (Signed)
  Problem: Education: Goal: Ability to describe self-care measures that may prevent or decrease complications (Diabetes Survival Skills Education) will improve Outcome: Progressing Goal: Individualized Educational Video(s) Outcome: Progressing   Problem: Coping: Goal: Ability to adjust to condition or change in health will improve Outcome: Progressing   Problem: Fluid Volume: Goal: Ability to maintain a balanced intake and output will improve Outcome: Progressing   Problem: Health Behavior/Discharge Planning: Goal: Ability to identify and utilize available resources and services will improve Outcome: Progressing Goal: Ability to manage health-related needs will improve Outcome: Progressing   Problem: Metabolic: Goal: Ability to maintain appropriate glucose levels will improve Outcome: Progressing   Problem: Nutritional: Goal: Maintenance of adequate nutrition will improve Outcome: Progressing Goal: Progress toward achieving an optimal weight will improve Outcome: Progressing   Problem: Skin Integrity: Goal: Risk for impaired skin integrity will decrease Outcome: Progressing   Problem: Tissue Perfusion: Goal: Adequacy of tissue perfusion will improve Outcome: Progressing   Problem: Education: Goal: Knowledge of General Education information will improve Description: Including pain rating scale, medication(s)/side effects and non-pharmacologic comfort measures Outcome: Progressing   Problem: Health Behavior/Discharge Planning: Goal: Ability to manage health-related needs will improve Outcome: Progressing   Problem: Clinical Measurements: Goal: Ability to maintain clinical measurements within normal limits will improve Outcome: Progressing Goal: Will remain free from infection Outcome: Progressing Goal: Diagnostic test results will improve Outcome: Progressing Goal: Respiratory complications will improve Outcome: Progressing Goal: Cardiovascular complication will  be avoided Outcome: Progressing   Problem: Activity: Goal: Risk for activity intolerance will decrease Outcome: Progressing   Problem: Nutrition: Goal: Adequate nutrition will be maintained Outcome: Progressing   Problem: Coping: Goal: Level of anxiety will decrease Outcome: Progressing   Problem: Elimination: Goal: Will not experience complications related to bowel motility Outcome: Progressing Goal: Will not experience complications related to urinary retention Outcome: Progressing   Problem: Pain Managment: Goal: General experience of comfort will improve and/or be controlled Outcome: Progressing   Problem: Safety: Goal: Ability to remain free from injury will improve Outcome: Progressing   Problem: Skin Integrity: Goal: Risk for impaired skin integrity will decrease Outcome: Progressing   Problem: Activity: Goal: Ability to tolerate increased activity will improve Outcome: Progressing   Problem: Respiratory: Goal: Ability to maintain a clear airway and adequate ventilation will improve Outcome: Progressing   Problem: Role Relationship: Goal: Method of communication will improve Outcome: Progressing

## 2024-06-08 DIAGNOSIS — J9601 Acute respiratory failure with hypoxia: Secondary | ICD-10-CM | POA: Diagnosis not present

## 2024-06-08 DIAGNOSIS — J9602 Acute respiratory failure with hypercapnia: Secondary | ICD-10-CM | POA: Diagnosis not present

## 2024-06-08 DIAGNOSIS — J690 Pneumonitis due to inhalation of food and vomit: Secondary | ICD-10-CM

## 2024-06-08 DIAGNOSIS — K3184 Gastroparesis: Secondary | ICD-10-CM

## 2024-06-08 DIAGNOSIS — K7682 Hepatic encephalopathy: Secondary | ICD-10-CM

## 2024-06-08 DIAGNOSIS — N179 Acute kidney failure, unspecified: Secondary | ICD-10-CM | POA: Diagnosis not present

## 2024-06-08 LAB — COMPREHENSIVE METABOLIC PANEL WITH GFR
ALT: 121 U/L — ABNORMAL HIGH (ref 0–44)
AST: 182 U/L — ABNORMAL HIGH (ref 15–41)
Albumin: 2.9 g/dL — ABNORMAL LOW (ref 3.5–5.0)
Alkaline Phosphatase: 54 U/L (ref 38–126)
Anion gap: 14 (ref 5–15)
BUN: 88 mg/dL — ABNORMAL HIGH (ref 6–20)
CO2: 32 mmol/L (ref 22–32)
Calcium: 8.7 mg/dL — ABNORMAL LOW (ref 8.9–10.3)
Chloride: 106 mmol/L (ref 98–111)
Creatinine, Ser: 1.56 mg/dL — ABNORMAL HIGH (ref 0.61–1.24)
GFR, Estimated: 53 mL/min — ABNORMAL LOW (ref >60)
Glucose, Bld: 123 mg/dL — ABNORMAL HIGH (ref 70–99)
Potassium: 3.3 mmol/L — ABNORMAL LOW (ref 3.5–5.1)
Sodium: 152 mmol/L — ABNORMAL HIGH (ref 135–145)
Total Bilirubin: 25.3 mg/dL (ref 0.0–1.2)
Total Protein: 5.6 g/dL — ABNORMAL LOW (ref 6.5–8.1)

## 2024-06-08 LAB — GLUCOSE, CAPILLARY
Glucose-Capillary: 118 mg/dL — ABNORMAL HIGH (ref 70–99)
Glucose-Capillary: 113 mg/dL — ABNORMAL HIGH (ref 70–99)
Glucose-Capillary: 109 mg/dL — ABNORMAL HIGH (ref 70–99)

## 2024-06-08 LAB — CBC
HCT: 30.7 % — ABNORMAL LOW (ref 39.0–52.0)
Hemoglobin: 10.7 g/dL — ABNORMAL LOW (ref 13.0–17.0)
MCH: 36.6 pg — ABNORMAL HIGH (ref 26.0–34.0)
MCHC: 34.9 g/dL (ref 30.0–36.0)
MCV: 105.1 fL — ABNORMAL HIGH (ref 80.0–100.0)
Platelets: 34 K/uL — ABNORMAL LOW (ref 150–400)
RBC: 2.92 MIL/uL — ABNORMAL LOW (ref 4.22–5.81)
RDW: 18.8 % — ABNORMAL HIGH (ref 11.5–15.5)
WBC: 13.1 K/uL — ABNORMAL HIGH (ref 4.0–10.5)
nRBC: 0 % (ref 0.0–0.2)

## 2024-06-08 LAB — MAGNESIUM: Magnesium: 3 mg/dL — ABNORMAL HIGH (ref 1.7–2.4)

## 2024-06-08 LAB — PHOSPHORUS: Phosphorus: 5.4 mg/dL — ABNORMAL HIGH (ref 2.5–4.6)

## 2024-06-08 MED ORDER — POTASSIUM CHLORIDE 20 MEQ PO PACK
40.0000 meq | PACK | ORAL | Status: AC
  Administered 2024-06-08 (×2): 40 meq
  Filled 2024-06-08 (×2): qty 2

## 2024-06-08 MED ORDER — ACETAMINOPHEN 325 MG PO TABS
650.0000 mg | ORAL_TABLET | Freq: Four times a day (QID) | ORAL | Status: DC
  Administered 2024-06-08: 650 mg
  Filled 2024-06-08: qty 2

## 2024-06-08 MED ORDER — VITAL 1.5 CAL PO LIQD
1000.0000 mL | ORAL | Status: DC
  Administered 2024-06-08: 1000 mL

## 2024-06-08 MED ORDER — ALBUMIN HUMAN 25 % IV SOLN
25.0000 g | Freq: Once | INTRAVENOUS | Status: AC
  Administered 2024-06-08: 25 g via INTRAVENOUS
  Filled 2024-06-08: qty 100

## 2024-06-08 MED ORDER — HYDROMORPHONE HCL 1 MG/ML IJ SOLN
0.5000 mg | INTRAMUSCULAR | Status: DC | PRN
  Administered 2024-06-08 (×2): 0.5 mg via INTRAVENOUS
  Filled 2024-06-08 (×2): qty 1

## 2024-06-08 MED ORDER — FREE WATER
100.0000 mL | Status: DC
  Administered 2024-06-08 (×4): 100 mL

## 2024-06-08 MED ORDER — MIDODRINE HCL 5 MG PO TABS
20.0000 mg | ORAL_TABLET | Freq: Three times a day (TID) | ORAL | Status: DC
  Administered 2024-06-08: 20 mg
  Filled 2024-06-08: qty 4

## 2024-06-08 MED ORDER — ACETAMINOPHEN 325 MG PO TABS
ORAL_TABLET | ORAL | Status: AC
  Administered 2024-06-08: 650 mg
  Filled 2024-06-08: qty 2

## 2024-06-08 NOTE — Progress Notes (Signed)
 SLP Cancellation Note  Patient Details Name: PETR BONTEMPO MRN: [id number] DOB: [birth year]   Cancelled treatment:       Reason Eval/Treat Not Completed: Other (comment) (Patient transitioning to hospice care. SLP will s/o at this time.)   Norleen IVAR Blase, MA, CCC-SLP Speech Therapy

## 2024-06-08 NOTE — Discharge Summary (Signed)
 Physician Discharge Summary         Patient ID: Omar Turner MRN: [id number] DOB/AGE: [birth year] 53 y.o.  Admit date: 05/29/2024 Discharge date: 06/08/2024  Discharge Diagnoses:    Active Hospital Problems   Diagnosis Date Noted   Hypotension 05/30/2024   Goals of care, counseling/discussion 06/03/2024   Alcoholic hepatitis 06/01/2024   Acute hypoxic respiratory failure (HCC) 05/31/2024   Decompensated cirrhosis (HCC) 05/31/2024   AKI (acute kidney injury) (HCC) 05/30/2024   Severe sepsis (HCC) 05/30/2024   Community acquired pneumonia 05/30/2024   Hepatic encephalopathy (HCC) 12/21/2023    Resolved Hospital Problems  No resolved problems to display.      Discharge summary    Omar Turner has PMH of PMH for Alcoholic Cirrhosis, Esophageal Varices s/p clipping, COPD, active drinker (1 pint Vodka daily) and smoker (1ppd), Ascites s/p multiple paracentesis, DT's, Withdrawal Seizures, recent MRI abd showing right pleural effusion who was brought in by ambulance on 8/23 with SVT and hypotension. Patient was found to have decompensated cirrhosis and subsequently developed septic shock and acute hypoxic respiratory failure due to aspiration pneumonia. He was intubated and aggressively IV diuresed. Given poor prognosis, end stage cirrhosis and not a candidate for transplant on 8/28 his family made him DNR. Ultimately he was liberated from mechanical ventilation and expressed his desire to transition to hospice.   Significant Hospital Events: 8/24: Admit to ICU 8/25: Off pressors, worsening hypoxia requiring intubation 8/26: Back on pressors, central line placed overnight 8/27: Increased FiO2 requirement overnight 8/28: Improving pressor requirement with weaning sedation, tolerating diureses, decreasing FiO2/ PEEP, mother changed to DNR 8/29 Down trending vasopressor support, NE 3, vaso 0.03 FiO2 60, peep 10 8/30 no acute events overnight, ventilator settings improved down to 40%  FiO2 and 8 of PEEP 8/31 tolerating spontaneous breathing trial but mental status precludes extubation, off vasopressors; para and thora 9/1 extubated  Discharge Plan by Active Problems    Acute respiratory failure with hypoxia and hypercapnia, improved Bilateral aspiration pneumonia with ARDS Severe sepsis with septic shock Bilateral pleural effusion right more than left, 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 S/p right thoracentesis 8/31 with 1400 cc removed. Cytology did not appear infectious. Stopped Zosyn  with transition to hospice.   Hepatic vasoplegia Continue midodrine  through transition to hospice.   Hepatic encephalopathy Continue lactulose  and rifaximin  through transition to hospice.   Decompensated alcoholic cirrhosis with recurrent ascites Likely decompensated alcoholic hepatitis Portal hypertensive gastropathy History of esophageal varices  Associated hyperammonemia, hyperbilirubinemia, coagulopathy, thrombocytopenia, and elevated LFTs Diagnostic para in ED did not look consistent with SBP. Not currently transplant candidate. Completed octreotide , NAC and steroid course. Palliative followed and ultimately given end stage liver disease, family made him DNR and then patient and mom decided to transition to hospice.   AKI, multifactorial Hypernatremia Hypoklaemia IV diuresed aggressively and developed worsening AKI. Given free water  for hypernatremia and electrolytes aggressively repleted.    Gastroparesis At risk for malnutrition  Hypoalbuminemia Given Reglan . Continued NGT for medications through hospice.  Alcohol abuse  Was on CIWA protocol, thiamine  and multivitamin.    Acute pain Management per hospice.   Significant Hospital tests/ studies   CT AP with contrast 8/23 IMPRESSION: 1. Mild to moderate severity left lower lobe and adjacent lingular infiltrate. 2. Large right pleural effusion. 3. Cirrhotic liver with innumerable  subcentimeter hepatic cysts. 4. Moderate amount of abdominopelvic ascites. 5. Markedly thickened and inflamed colon, consistent with colitis. 6. Aortic atherosclerosis.  US  abdomen  RUQ 8/24 IMPRESSION: 1. Cirrhotic liver with moderate to large volume ascites. 2. Gallbladder wall thickening without gallstones. Gallbladder is incompletely distended. Gallbladder wall thickening is nonspecific in the setting of cirrhosis and ascites. 3. Right pleural effusion.  Procedures    8/23: paracentesis in ED  8/25: intubated for acute hypoxic respiratory failure, central line placed 8/27: right radial arterial line 8/31: paracentesis with 4500 cc removed, right thoracentesis with 1400 cc removed  Culture data/antimicrobials    Recent Results (from the past 720 hours)  Resp panel by RT-PCR (RSV, Flu A&B, Covid) Anterior Nasal Swab     Status: None   Collection Time: 05/29/24  6:27 PM   Specimen: Anterior Nasal Swab  Result Value Ref Range Status   SARS Coronavirus 2 by RT PCR NEGATIVE NEGATIVE Final   Influenza A by PCR NEGATIVE NEGATIVE Final   Influenza B by PCR NEGATIVE NEGATIVE Final    Comment: (NOTE) The Xpert Xpress SARS-CoV-2/FLU/RSV plus assay is intended as an aid in the diagnosis of influenza from Nasopharyngeal swab specimens and should not be used as a sole basis for treatment. Nasal washings and aspirates are unacceptable for Xpert Xpress SARS-CoV-2/FLU/RSV testing.  Fact Sheet for Patients: BloggerCourse.com  Fact Sheet for Healthcare Providers: SeriousBroker.it  This test is not yet approved or cleared by the [place]  FDA and has been authorized for detection and/or diagnosis of SARS-CoV-2 by FDA under an Emergency Use Authorization (EUA). This EUA will remain in effect (meaning this test can be used) for the duration of the COVID-19 declaration under Section 564(b)(1) of the Act, 21 U.S.C. section 360bbb-3(b)(1),  unless the authorization is terminated or revoked.     Resp Syncytial Virus by PCR NEGATIVE NEGATIVE Final    Comment: (NOTE) Fact Sheet for Patients: BloggerCourse.com  Fact Sheet for Healthcare Providers: SeriousBroker.it  This test is not yet approved or cleared by the [place]  FDA and has been authorized for detection and/or diagnosis of SARS-CoV-2 by FDA under an Emergency Use Authorization (EUA). This EUA will remain in effect (meaning this test can be used) for the duration of the COVID-19 declaration under Section 564(b)(1) of the Act, 21 U.S.C. section 360bbb-3(b)(1), unless the authorization is terminated or revoked.  Performed at [facility], [address] [place], [place] [address]   Blood Culture (routine x 2)     Status: None   Collection Time: 05/29/24  6:30 PM   Specimen: BLOOD  Result Value Ref Range Status   Specimen Description BLOOD RIGHT ANTECUBITAL  Final   Special Requests   Final    BOTTLES DRAWN AEROBIC AND ANAEROBIC Blood Culture adequate volume   Culture   Final    NO GROWTH 6 DAYS Performed at [facility], [address], [place], [place] [address]    Report Status 06/04/2024 FINAL  Final  Blood Culture (routine x 2)     Status: None   Collection Time: 05/29/24  7:00 PM   Specimen: BLOOD LEFT WRIST  Result Value Ref Range Status   Specimen Description BLOOD LEFT WRIST  Final   Special Requests   Final    BOTTLES DRAWN AEROBIC AND ANAEROBIC Blood Culture results may not be optimal due to an inadequate volume of blood received in culture bottles   Culture   Final    NO GROWTH 6 DAYS Performed at [facility], [address] [place], [place] [address]    Report Status 06/04/2024 FINAL  Final  Body  fluid culture w Gram Stain     Status: None   Collection Time: 05/29/24  7:03 PM   Specimen: Peritoneal Cavity; Peritoneal Fluid  Result Value Ref Range Status   Specimen  Description PERITONEAL CAVITY  Final   Special Requests Normal  Final   Gram Stain NO WBC SEEN NO ORGANISMS SEEN   Final   Culture   Final    NO GROWTH 3 DAYS Performed at [facility], [address] [place], [place] [address]    Report Status 06/01/2024 FINAL  Final  MRSA Next Gen by PCR, Nasal     Status: None   Collection Time: 05/30/24  1:47 AM   Specimen: Nasal Mucosa; Nasal Swab  Result Value Ref Range Status   MRSA by PCR Next Gen NOT DETECTED NOT DETECTED Final    Comment: (NOTE) The GeneXpert MRSA Assay (FDA approved for NASAL specimens only), is one component of a comprehensive MRSA colonization surveillance program. It is not intended to diagnose MRSA infection nor to guide or monitor treatment for MRSA infections. Test performance is not FDA approved in patients less than 76 years old. Performed at [facility], [address], [place], [place] [address]   Respiratory (~20 pathogens) panel by PCR     Status: None   Collection Time: 05/30/24 12:41 PM   Specimen: Nasopharyngeal Swab; Respiratory  Result Value Ref Range Status   Adenovirus NOT DETECTED NOT DETECTED Final   Coronavirus 229E NOT DETECTED NOT DETECTED Final    Comment: (NOTE) The Coronavirus on the Respiratory Panel, DOES NOT test for the novel  Coronavirus (2019 nCoV)    Coronavirus HKU1 NOT DETECTED NOT DETECTED Final   Coronavirus NL63 NOT DETECTED NOT DETECTED Final   Coronavirus OC43 NOT DETECTED NOT DETECTED Final   Metapneumovirus NOT DETECTED NOT DETECTED Final   Rhinovirus / Enterovirus NOT DETECTED NOT DETECTED Final   Influenza A NOT DETECTED NOT DETECTED Final   Influenza B NOT DETECTED NOT DETECTED Final   Parainfluenza Virus 1 NOT DETECTED NOT DETECTED Final   Parainfluenza Virus 2 NOT DETECTED NOT DETECTED Final   Parainfluenza Virus 3 NOT DETECTED NOT DETECTED Final   Parainfluenza Virus 4 NOT DETECTED NOT DETECTED Final   Respiratory Syncytial Virus NOT DETECTED NOT  DETECTED Final   Bordetella pertussis NOT DETECTED NOT DETECTED Final   Bordetella Parapertussis NOT DETECTED NOT DETECTED Final   Chlamydophila pneumoniae NOT DETECTED NOT DETECTED Final   Mycoplasma pneumoniae NOT DETECTED NOT DETECTED Final    Comment: Performed at [facility], [address] [place], [place] [address]  Gastrointestinal Panel by PCR , Stool     Status: None   Collection Time: 05/30/24  1:00 PM   Specimen: Stool  Result Value Ref Range Status   Campylobacter species NOT DETECTED NOT DETECTED Final   Plesimonas shigelloides NOT DETECTED NOT DETECTED Final   Salmonella species NOT DETECTED NOT DETECTED Final   Yersinia enterocolitica NOT DETECTED NOT DETECTED Final   Vibrio species NOT DETECTED NOT DETECTED Final   Vibrio cholerae NOT DETECTED NOT DETECTED Final   Enteroaggregative E coli (EAEC) NOT DETECTED NOT DETECTED Final   Enteropathogenic E coli (EPEC) NOT DETECTED NOT DETECTED Final   Enterotoxigenic E coli (ETEC) NOT DETECTED NOT DETECTED Final   Shiga like toxin producing E coli (STEC) NOT DETECTED NOT DETECTED Final   Shigella/Enteroinvasive E coli (EIEC) NOT DETECTED NOT DETECTED Final   Cryptosporidium NOT DETECTED NOT DETECTED Final  Cyclospora cayetanensis NOT DETECTED NOT DETECTED Final   Entamoeba histolytica NOT DETECTED NOT DETECTED Final   Giardia lamblia NOT DETECTED NOT DETECTED Final   Adenovirus F40/41 NOT DETECTED NOT DETECTED Final   Astrovirus NOT DETECTED NOT DETECTED Final   Norovirus GI/GII NOT DETECTED NOT DETECTED Final   Rotavirus A NOT DETECTED NOT DETECTED Final   Sapovirus (I, II, IV, and V) NOT DETECTED NOT DETECTED Final    Comment: Performed at [facility], [address] [place], [place] [address]  Culture, Respiratory w Gram Stain     Status: None   Collection Time: 06/03/24 11:00 AM   Specimen: Tracheal Aspirate; Respiratory  Result Value Ref Range Status   Specimen Description TRACHEAL ASPIRATE  Final    Special Requests NONE  Final   Gram Stain   Final    NO WBC SEEN NO ORGANISMS SEEN Performed at [facility], [address], [place], [place] [address]    Culture RARE CANDIDA ALBICANS  Final   Report Status 06/06/2024 FINAL  Final  Culture, Respiratory w Gram Stain     Status: None (Preliminary result)   Collection Time: 06/06/24  9:40 AM   Specimen: Tracheal Aspirate; Respiratory  Result Value Ref Range Status   Specimen Description TRACHEAL ASPIRATE  Final   Special Requests NONE  Final   Gram Stain   Final    FEW SQUAMOUS EPITHELIAL CELLS PRESENT WBC PRESENT, PREDOMINANTLY PMN RARE GRAM POSITIVE COCCI    Culture   Final    CULTURE REINCUBATED FOR BETTER GROWTH Performed at [facility], [address], [place], [place] [address]    Report Status PENDING  Incomplete  Body fluid culture w Gram Stain     Status: None (Preliminary result)   Collection Time: 06/06/24  3:36 PM   Specimen: Pleural Fluid  Result Value Ref Range Status   Specimen Description PLEURAL  Final   Special Requests NONE  Final   Gram Stain   Final    RARE WBC PRESENT, PREDOMINANTLY MONONUCLEAR NO ORGANISMS SEEN    Culture   Final    NO GROWTH 2 DAYS Performed at [facility], [address] [place], [place] [address]    Report Status PENDING  Incomplete     Consults   Discharge Exam: General: resting in bed, somnolent, jaundiced Neuro: oriented to person only, non-focal Pulm: breathing comfortably on 4L nasal cannula, coarse throughout, diminished bases CV: normal S1 or S2, no m/r/g GI: non-tender, non-distended, hypoactive bowel sounds Extremities: warm, 1-2+ pitting edema GU: deferred  BP (!) 81/64   Pulse 85   Temp 98.4 F (36.9 C) (Axillary)   Resp 16   Ht 5' 11 (1.803 m)   Wt 94.8 kg   SpO2 90%   BMI 29.15 kg/m    Labs at discharge   Lab Results  Component Value Date   CREATININE 1.56 (H) 06/08/2024   BUN 88 (H) 06/08/2024   NA 152 (H) 06/08/2024    K 3.3 (L) 06/08/2024   CL 106 06/08/2024   CO2 32 06/08/2024   Lab Results  Component Value Date   WBC 13.1 (H) 06/08/2024   HGB 10.7 (L) 06/08/2024   HCT 30.7 (L) 06/08/2024   MCV 105.1 (H) 06/08/2024   PLT 34 (L) 06/08/2024   Lab Results  Component Value Date   ALT 121 (H) 06/08/2024   AST 182 (H) 06/08/2024   ALKPHOS 54 06/08/2024   BILITOT 25.3 (  HH) 06/08/2024   Lab Results  Component Value Date   INR 2.2 (H) 06/04/2024   INR 2.4 (H) 06/03/2024   INR 2.3 (H) 06/02/2024    Current radiological studies    DG Abd 1 View Result Date: 06/07/2024 CLINICAL DATA:  NG tube placement. EXAM: ABDOMEN - 1 VIEW COMPARISON:  05/31/2024 FINDINGS: The tip of the NG tube is in the mid stomach. Proximal side port may be at the GE junction or in collapsed proximal stomach. Gaseous distention of the visualized abdominal colon evident. IMPRESSION: The tip of the NG tube is in the mid stomach. Proximal side port may be at the GE junction or in a collapsed proximal stomach. Electronically Signed   By: Camellia Candle M.D.   On: 06/07/2024 09:39   DG CHEST PORT 1 VIEW Result Date: 06/06/2024 CLINICAL DATA:  [id number] S/P thoracentesis [id number] EXAM: PORTABLE CHEST 1 VIEW COMPARISON:  Chest x-ray 06/04/2024, CT chest 06/07/2016 FINDINGS: Endotracheal tube terminates 7 cm above the carina. Left internal jugular central venous catheter with tip overlying the expected region of the superior caval junction. Enteric tube coursing below the hemidiaphragm with tip and side port collimated off view. The heart and mediastinal contours are within normal limits. No focal consolidation. No pulmonary edema. Interval decrease in size of a trace right pleural effusion. No definite left pleural effusion. No pneumothorax. No acute osseous abnormality. IMPRESSION: 1. Trace right pleural effusion. 2. Endotracheal tube 7 cm above the carina. Lines and tubes as above. Electronically Signed   By: Morgane  Naveau M.D.   On:  06/06/2024 16:17    Disposition:    Discharge disposition: 51-Hospice/Medical Facility     Family and patient decided to transition to hospice care    Allergies as of 06/08/2024       Reactions   Chantix [varenicline] Other (See Comments)   Gave patient nightmares.   Lexapro [escitalopram] Other (See Comments)   Made him feel weird.        Medication List     STOP taking these medications    cetirizine 10 MG tablet Commonly known as: ZYRTEC   eplerenone  50 MG tablet Commonly known as: INSPRA    ferrous sulfate  324 MG Tbec   folic acid  1 MG tablet Commonly known as: FOLVITE    lactulose  10 GM/15ML solution Commonly known as: CHRONULAC    multivitamin with minerals Tabs tablet   nadolol  40 MG tablet Commonly known as: CORGARD    naltrexone 50 MG tablet Commonly known as: DEPADE   ondansetron  4 MG disintegrating tablet Commonly known as: ZOFRAN -ODT   pantoprazole  40 MG tablet Commonly known as: PROTONIX    phenylephrine -shark liver oil-mineral oil-petrolatum 0.25-3-14-71.9 % rectal ointment Commonly known as: PREPARATION H   potassium chloride  10 MEQ tablet Commonly known as: KLOR-CON    spironolactone  100 MG tablet Commonly known as: ALDACTONE    thiamine  100 MG tablet Commonly known as: Vitamin B-1   Xifaxan  550 MG Tabs tablet Generic drug: rifaximin        TAKE these medications    acetaminophen  500 MG tablet Commonly known as: TYLENOL  Take 1,000 mg by mouth every 6 (six) hours as needed for mild pain (pain score 1-3) or moderate pain (pain score 4-6).   albuterol  108 (90 Base) MCG/ACT inhaler Commonly known as: VENTOLIN  HFA Inhale 1-2 puffs into the lungs every 6 (six) hours as needed.   furosemide  40 MG tablet Commonly known as: LASIX  Take 40 mg by mouth daily.   tetrahydrozoline 0.05 % ophthalmic solution Place 2  drops into both eyes daily as needed (irritated eyes).         Follow-up appointment   N/A  Discharge Condition:     Critical, transitioning to hospice  30 minutes of time have been dedicated to discharge assessment, planning and discharge instructions.   Signed: Rexene LOISE Blush 06/08/2024, 2:02 PM

## 2024-06-08 NOTE — Progress Notes (Signed)
 Received request from Transitions of Care Manager for family interest in Hospice Home.  Eligibility confirmed.  Met or spoke with patient/family to confirm interest and explain services.  Family agreeable to transfer today.  Transitions of Care Manager aware.  RN please call report to [phone] prior to patient leaving the Unit.  Please send signed and completed DNR with patient at discharge.    Thank you. Inocente Jacobs RN BSN [facility] Liaison [phone]

## 2024-06-08 NOTE — TOC Progression Note (Signed)
 Transition of Care [facility]) - Progression Note    Patient Details  Name: Omar Turner MRN: [id number] Date of Birth: Jun 07, 1971  [facility]) CM/SW Contact  Lendia Dais, [place] Phone Number: 06/08/2024, 11:54 AM  Clinical Narrative:   CSW spoke to pt's daughter and mother at bedside.   CSW informed pt's family of TOC consult for residential hospice. CSW asked the preference for hospice the pt's Mother Omar Turner stated Physicist, medical in [place].  CSW reached out to [facility] and waiting for bed availability confirmation.  CSW will continue to monitor.                      Expected Discharge Plan and Services                                               Social Drivers of Health (SDOH) Interventions SDOH Screenings   Food Insecurity: Patient Unable To Answer (06/02/2024)  Housing: Patient Unable To Answer (06/02/2024)  Transportation Needs: Patient Unable To Answer (06/02/2024)  Utilities: Patient Unable To Answer (06/02/2024)  Financial Resource Strain: Low Risk  (12/11/2020)   [facility]  Social Connections: Patient Unable To Answer (11/18/2023)  Tobacco Use: High Risk (05/29/2024)    Readmission Risk Interventions    12/18/2023    2:33 PM  Readmission Risk Prevention Plan  Transportation Screening Complete  PCP or Specialist Appt within 3-5 Days Complete  HRI or Home Care Consult Complete  Social Work Consult for Recovery Care Planning/Counseling Complete  Palliative Care Screening Not Applicable  Medication Review Oceanographer) Complete

## 2024-06-08 NOTE — Progress Notes (Addendum)
 NAME:  Omar Turner, MRN:  [id number], DOB:  October 15, 1970, LOS: 9 ADMISSION DATE:  05/29/2024, CONSULTATION DATE:  05/30/2024 REFERRING MD:  Benton Shone, MD, CHIEF COMPLAINT:  SOB  History of Present Illness:  53 y/o male with PMH for Alcoholic Cirrhosis, Esophageal Varices s/p clipping, COPD, active drinker (1 pint Vodka daily) and smoker (1ppd), Ascites s/p multiple paracentesis, DT's, Withdrawal Seizures, recent MRI abd showing right pleural effusion.  He presented with SOB today.  He says for the last 3 days he has been very weak, not able to get out of bed much.  He has been having N/V/D.  He cannot hold much down and has not taken his meds in a couple days as a result.  He is more SOB and has a dry cough and has been wheezing.  He has an Albuterol  inhaler which he uses prn.  He also has abd pain throughout.  Apparently he was sitting on his porch and found himself to be light headed, almost passing out and fast heart rate.  EMS found him with HR 180's and gave him a 100J shock.  His HR decreased to the 160's.  He remained hypotensive.  His LA was 7.83.  A paracentesis was done in the ED and he had a paracentesis 1 week ago outpatient with about 2 liters removed.  Pertinent  Medical History  Alcoholic Cirrhosis, Esophageal Varices s/p clipping, COPD, active drinker (1 pint Vodka daily) and smoker (1ppd), Ascites s/p multiple paracentesis, DT's, Withdrawal Seizures, recent MRI abd showing right pleural effusion.    Significant Hospital Events: Including procedures, antibiotic start and stop dates in addition to other pertinent events   8/24: Admit to ICU 8/25: Off pressors, worsening hypoxia requiring intubation 8/26: Back on pressors, central line placed overnight 8/27: Increased FiO2 requirement overnight 8/28: Improving pressor requirement with weaning sedation, tolerating diureses, decreasing FiO2/ PEEP, mother changed to DNR 8/29 Down trending vasopressor support, NE 3, vaso 0.03 FiO2  60, peep 10 8/30 no acute events overnight, ventilator settings improved down to 40% FiO2 and 8 of PEEP 8/31 tolerating spontaneous breathing trial but mental status precludes extubation, off vasopressors; para and thora 9/1 extubated  Interim History / Subjective:  Extubated yesterday Complains of pain everywhere  Objective    Blood pressure (!) 78/50, pulse 85, temperature 97.6 F (36.4 C), temperature source Axillary, resp. rate 17, height 5' 11 (1.803 m), weight 94.8 kg, SpO2 94%.    FiO2 (%):  [4 %] 4 %   Intake/Output Summary (Last 24 hours) at 06/08/2024 1009 Last data filed at 06/08/2024 0855 Gross per 24 hour  Intake 951.07 ml  Output 2350 ml  Net -1398.93 ml   Filed Weights   06/06/24 0500 06/07/24 0500 06/08/24 0500  Weight: 100.2 kg 102 kg 94.8 kg    Examination: General: resting in bed, somnolent, jaundiced Neuro: oriented to person only, non-focal Pulm: breathing comfortably on 4L nasal cannula, coarse throughout, diminished bases CV: normal S1 or S2, no m/r/g GI: non-tender, non-distended, hypoactive bowel sounds Extremities: warm, 1-2+ pitting edema GU: deferred  Assessment and Plan   Acute respiratory failure with hypoxia and hypercapnia, improved Bilateral aspiration pneumonia with ARDS Severe sepsis with septic shock Bilateral pleural effusion right more than left, 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 Now breathing comfortably on 4L nasal cannula. S/p right thoracentesis 8/31 with 1400 cc removed. Cytology does not appear infectious.  - Follow-up tracheal aspirate - Completing 10 day course of Zosyn   Hepatic vasoplegia - SBP goal > 80, increase midodrine  and give 25 gm 25% albumin   Hyperactive delirium Hepatic encephalopathy - Wean dexmedetomidine , add PRN Haldol if needed for hyperactive delirium  - Continue home rifaximin  - Titrate lactulose  for BMs - Delirium precautions: re-orient frequently, optimize sleep wake  cycles, limit sedating medications    Decompensated alcoholic cirrhosis with recurrent ascites Likely decompensated alcoholic hepatitis Portal hypertensive gastropathy History of esophageal varices  Associated hyperammonemia, hyperbilirubinemia, coagulopathy, thrombocytopenia, and elevated LFTs Diagnostic para in ED did not look consistent with SBP. Not currently transplant candidate. - Trend daily CMP, CBC, coags with correction of coagulopathy as clinically indicated - Holding home naldolol, eplerenone  and spironolactone  with shock and pressor requirement - SCDs only with thrombocytopenia - Poor prognosis, palliative following   AKI, due to aggressive diuresis Hypernatremia Hypoklaemia - Increase FWF - Holding diuresis with AKI and hypernatremia, suspect intravascularly volume down - Continue to trend daily BMP - Replete electrolytes - Avoid nephrotoxins, renally dose medications and ensure adequate renal perfusion   Gastroparesis At risk for malnutrition  Hypoalbuminemia - Continue Reglan  10 mg q8h - NGT in place, resume trickle tube feeds, monitor residuals closely and consider increasing to goal if tolerated - Formal swallow evaluation ordered   Alcohol abuse  - Continue thiamine  and multivitamin   Deconditioning - PT/OT ordered   The patient is critically ill with multiple organ system failure and requires high complexity decision making for assessment and support, frequent evaluation and titration of therapies, advanced monitoring, review of radiographic studies and interpretation of complex data.   Critical Care Time devoted to patient care services, exclusive of separately billable procedures, described in this note is 30 minutes.  Rexene LOISE Blush, PA-C [facility] 06/08/24 10:43 AM  Please see [email or web address] for pager details.  From 7A-7P if no response, please call [phone] After hours, please call ELink [phone]

## 2024-06-08 NOTE — TOC Transition Note (Signed)
 Transition of Care [facility]) - Discharge Note   Patient Details  Name: Omar Turner MRN: [id number] Date of Birth: [birth year]  [facility] CM/SW Contact:  Lendia Dais, LCSWA Phone Number: 06/08/2024, 3:29 PM   Clinical Narrative:  Pt is discharging to [facility] in [place]. RN Report to [phone]. RN will arrange trasnportation.   No further ICM needs.    Final next level of care: Rest Home Barriers to Discharge: Barriers Resolved   Patient Goals and CMS Choice            Discharge Placement              Patient chooses bed at:  [facility]) Patient to be transferred to facility by: [facility] Name of family member notified: Particia (mother) Patient and family notified of of transfer: 06/08/24  Discharge Plan and Services Additional resources added to the After Visit Summary for                                       Social Drivers of Health (SDOH) Interventions SDOH Screenings   Food Insecurity: Patient Unable To Answer (06/02/2024)  Housing: Patient Unable To Answer (06/02/2024)  Transportation Needs: Patient Unable To Answer (06/02/2024)  Utilities: Patient Unable To Answer (06/02/2024)  Financial Resource Strain: Low Risk  (12/11/2020)   Received from [facility]  Social Connections: Patient Unable To Answer (11/18/2023)  Tobacco Use: High Risk (05/29/2024)     Readmission Risk Interventions    12/18/2023    2:33 PM  Readmission Risk Prevention Plan  Transportation Screening Complete  PCP or Specialist Appt within 3-5 Days Complete  HRI or Home Care Consult Complete  Social Work Consult for Recovery Care Planning/Counseling Complete  Palliative Care Screening Not Applicable  Medication Review [employer]) Complete

## 2024-06-08 NOTE — Progress Notes (Signed)
 [facility] ADULT ICU REPLACEMENT PROTOCOL   The patient does apply for the [facility] Adult ICU Electrolyte Replacment Protocol based on the criteria listed below:   1.Exclusion criteria: TCTS, ECMO, Dialysis, and Myasthenia Gravis patients 2. Is GFR >/= 30 ml/min? Yes.    Patient's GFR today is 53 3. Is SCr </= 2? Yes.   Patient's SCr is 1.56 mg/dL 4. Did SCr increase >/= 0.5 in 24 hours? No. 5.Pt's weight >40kg  Yes.   6. Abnormal electrolyte(s): K+ = 3.3  7. Electrolytes replaced per protocol 8.  Call MD STAT for K+ </= 2.5, Phos </= 1, or Mag </= 1 Physician:  Haze, eMD   Omar Turner Omar Turner 06/08/2024 6:18 AM

## 2024-06-08 NOTE — IPAL (Signed)
  Interdisciplinary Goals of Care Family Meeting   Date carried out: 06/08/2024  Location of the meeting: Bedside  Member's involved: Physician and Family Member or next of kin  Durable Power of Attorney or acting medical decision maker: Omar Turner (mother)  Discussion: We discussed goals of care for Omar Turner .  We establish that he is undergoing the natural progression of end-stage liver disease.  I have no further curative treatment options for him.  He is actively dying.  I recommended transition to hospice care.  His mother Omar Turner desires outpatient hospice in [place] where their family can visit them.  While we are having a conversation the patient was able to clearly state that he wanted to transition to outpatient hospice right now.  He acknowledged being in pain.  Code status:   Code Status: Limited: Do not attempt resuscitation (DNR) -DNR-LIMITED -Do Not Intubate/DNI    Disposition: Home with Hospice outpatient hospice  Will consult transitions of care team to help transition him to outpatient hospice in [place] within the next 24 hours if possible.  I discussed with the mother that if a bed was not available we could also transition to inpatient hospice care in the hospital.  Will continue medications for comfort and blood pressure to help facilitate transition to outpatient hospice.  Will liberalize pain medications as needed.  No escalation of care, no addition of pressors.  Time spent for the meeting: 25 minutes    Omar GORMAN Gore, MD  06/08/2024, 11:18 AM

## 2024-06-08 NOTE — Progress Notes (Signed)
 OT Cancellation Note  Patient Details Name: Omar Turner MRN: [id number] DOB: Aug 07, 1971   Cancelled Treatment:    Reason Eval/Treat Not Completed: Other (comment) pt and family with wishes to pursue hospice care at this time. Please re-consult if future needs arise.   Omar Turner, Omar Turner   Omar Turner 06/08/2024, 3:08 PM

## 2024-06-08 NOTE — Plan of Care (Signed)
 Problem: Coping: Goal: Ability to adjust to condition or change in health will improve Outcome: Progressing   Problem: Fluid Volume: Goal: Ability to maintain a balanced intake and output will improve Outcome: Progressing   Problem: Metabolic: Goal: Ability to maintain appropriate glucose levels will improve Outcome: Progressing   Problem: Nutritional: Goal: Maintenance of adequate nutrition will improve Outcome: Progressing   Problem: Skin Integrity: Goal: Risk for impaired skin integrity will decrease Outcome: Progressing   Problem: Tissue Perfusion: Goal: Adequacy of tissue perfusion will improve Outcome: Progressing   Problem: Clinical Measurements: Goal: Ability to maintain clinical measurements within normal limits will improve Outcome: Progressing   Problem: Clinical Measurements: Goal: Will remain free from infection Outcome: Progressing   Problem: Clinical Measurements: Goal: Diagnostic test results will improve Outcome: Progressing   Problem: Clinical Measurements: Goal: Respiratory complications will improve Outcome: Progressing   Problem: Clinical Measurements: Goal: Cardiovascular complication will be avoided Outcome: Progressing   Problem: Coping: Goal: Level of anxiety will decrease Outcome: Progressing   Problem: Elimination: Goal: Will not experience complications related to bowel motility Outcome: Progressing   Problem: Activity: Goal: Ability to tolerate increased activity will improve Outcome: Progressing   Problem: Respiratory: Goal: Ability to maintain a clear airway and adequate ventilation will improve Outcome: Progressing    Plan of care, monitoring, assessment, treatment, and intervention (s) ongoing, see  MAR see flowsheet

## 2024-06-08 NOTE — Progress Notes (Signed)
 PT Cancellation Note  Patient Details Name: Omar Turner MRN: [id number] DOB: September 29, 1971   Cancelled Treatment:    Reason Eval/Treat Not Completed: (P) Other (comment) Pt and family pursuing Hospice Care. PT will sign off.   Ednah Hammock B. Fleeta Lapidus PT, DPT Acute Rehabilitation Services Please use secure chat or  Call Office [phone]    Almarie KATHEE Fleeta [facility] 06/08/2024, 12:06 PM

## 2024-06-09 LAB — CULTURE, RESPIRATORY W GRAM STAIN: Culture: NORMAL

## 2024-06-10 LAB — BODY FLUID CULTURE W GRAM STAIN: Culture: NO GROWTH

## 2024-07-07 DEATH — deceased
